# Patient Record
Sex: Female | Born: 1955 | Race: Black or African American | Hispanic: No | Marital: Single | Smoking: Current every day smoker
Health system: Southern US, Community
[De-identification: ages and names within clinical notes are randomized; demographics above are authoritative.]

## PROBLEM LIST (undated history)

## (undated) DIAGNOSIS — I10 Essential (primary) hypertension: Secondary | ICD-10-CM

## (undated) DIAGNOSIS — E785 Hyperlipidemia, unspecified: Secondary | ICD-10-CM

## (undated) DIAGNOSIS — R200 Anesthesia of skin: Secondary | ICD-10-CM

## (undated) DIAGNOSIS — K439 Ventral hernia without obstruction or gangrene: Secondary | ICD-10-CM

## (undated) DIAGNOSIS — M1990 Unspecified osteoarthritis, unspecified site: Secondary | ICD-10-CM

## (undated) DIAGNOSIS — H35039 Hypertensive retinopathy, unspecified eye: Secondary | ICD-10-CM

## (undated) DIAGNOSIS — R51 Headache: Secondary | ICD-10-CM

## (undated) DIAGNOSIS — J189 Pneumonia, unspecified organism: Secondary | ICD-10-CM

## (undated) DIAGNOSIS — R42 Dizziness and giddiness: Secondary | ICD-10-CM

## (undated) DIAGNOSIS — R609 Edema, unspecified: Secondary | ICD-10-CM

## (undated) DIAGNOSIS — H269 Unspecified cataract: Secondary | ICD-10-CM

## (undated) DIAGNOSIS — R519 Headache, unspecified: Secondary | ICD-10-CM

## (undated) DIAGNOSIS — R600 Localized edema: Secondary | ICD-10-CM

## (undated) HISTORY — PX: LUMBAR FUSION: SHX111

## (undated) HISTORY — PX: VENTRAL HERNIA REPAIR: SHX424

## (undated) HISTORY — PX: ABDOMINAL HYSTERECTOMY: SHX81

## (undated) HISTORY — PX: FEMUR IM NAIL: SHX1597

## (undated) HISTORY — DX: Unspecified cataract: H26.9

## (undated) HISTORY — PX: UTERINE FIBROID SURGERY: SHX826

## (undated) HISTORY — DX: Hypertensive retinopathy, unspecified eye: H35.039

## (undated) HISTORY — PX: FRACTURE SURGERY: SHX138

## (undated) HISTORY — PX: OTHER SURGICAL HISTORY: SHX169

---

## 2007-11-17 ENCOUNTER — Ambulatory Visit: Payer: Self-pay | Admitting: Internal Medicine

## 2007-11-17 DIAGNOSIS — D259 Leiomyoma of uterus, unspecified: Secondary | ICD-10-CM | POA: Insufficient documentation

## 2007-11-17 DIAGNOSIS — I10 Essential (primary) hypertension: Secondary | ICD-10-CM | POA: Insufficient documentation

## 2007-11-17 LAB — CONVERTED CEMR LAB
Bilirubin Urine: NEGATIVE
Calcium: 9.4 mg/dL (ref 8.4–10.5)
Glucose, Bld: 88 mg/dL (ref 70–99)
Glucose, Urine, Semiquant: NEGATIVE
Ketones, urine, test strip: NEGATIVE
Potassium: 5.2 meq/L (ref 3.5–5.3)
Sodium: 144 meq/L (ref 135–145)
Urobilinogen, UA: 0.2
pH: 5

## 2007-11-25 ENCOUNTER — Ambulatory Visit (HOSPITAL_COMMUNITY): Admission: RE | Admit: 2007-11-25 | Discharge: 2007-11-25 | Payer: Self-pay | Admitting: Internal Medicine

## 2007-12-01 ENCOUNTER — Ambulatory Visit: Payer: Self-pay | Admitting: Internal Medicine

## 2007-12-02 ENCOUNTER — Encounter (INDEPENDENT_AMBULATORY_CARE_PROVIDER_SITE_OTHER): Payer: Self-pay | Admitting: Internal Medicine

## 2007-12-06 ENCOUNTER — Encounter (INDEPENDENT_AMBULATORY_CARE_PROVIDER_SITE_OTHER): Payer: Self-pay | Admitting: Internal Medicine

## 2007-12-06 ENCOUNTER — Encounter: Admission: RE | Admit: 2007-12-06 | Discharge: 2007-12-06 | Payer: Self-pay | Admitting: Internal Medicine

## 2007-12-08 ENCOUNTER — Ambulatory Visit: Payer: Self-pay | Admitting: Internal Medicine

## 2007-12-23 ENCOUNTER — Encounter (INDEPENDENT_AMBULATORY_CARE_PROVIDER_SITE_OTHER): Payer: Self-pay | Admitting: *Deleted

## 2007-12-23 LAB — CONVERTED CEMR LAB
CO2: 22 meq/L (ref 19–32)
Chloride: 114 meq/L — ABNORMAL HIGH (ref 96–112)
Glucose, Bld: 77 mg/dL (ref 70–99)
Potassium: 4.9 meq/L (ref 3.5–5.3)
Sodium: 146 meq/L — ABNORMAL HIGH (ref 135–145)

## 2008-01-24 ENCOUNTER — Ambulatory Visit: Payer: Self-pay | Admitting: Internal Medicine

## 2008-01-29 ENCOUNTER — Encounter (INDEPENDENT_AMBULATORY_CARE_PROVIDER_SITE_OTHER): Payer: Self-pay | Admitting: Internal Medicine

## 2008-01-29 LAB — CONVERTED CEMR LAB
ALT: 21 units/L (ref 0–35)
Alkaline Phosphatase: 67 units/L (ref 39–117)
Basophils Absolute: 0 10*3/uL (ref 0.0–0.1)
CO2: 25 meq/L (ref 19–32)
Cholesterol: 175 mg/dL (ref 0–200)
Creatinine, Ser: 1.02 mg/dL (ref 0.40–1.20)
Eosinophils Absolute: 0.1 10*3/uL (ref 0.0–0.7)
Eosinophils Relative: 1 % (ref 0–5)
HCT: 43 % (ref 36.0–46.0)
Hemoglobin: 13.9 g/dL (ref 12.0–15.0)
LDL Cholesterol: 78 mg/dL (ref 0–99)
Lymphocytes Relative: 36 % (ref 12–46)
MCHC: 32.3 g/dL (ref 30.0–36.0)
MCV: 74.1 fL — ABNORMAL LOW (ref 78.0–100.0)
Monocytes Absolute: 0.4 10*3/uL (ref 0.1–1.0)
Platelets: 321 10*3/uL (ref 150–400)
RDW: 16.4 % — ABNORMAL HIGH (ref 11.5–15.5)
Sodium: 140 meq/L (ref 135–145)
Total Bilirubin: 0.5 mg/dL (ref 0.3–1.2)
Total CHOL/HDL Ratio: 2.5
Total Protein: 8.1 g/dL (ref 6.0–8.3)
Triglycerides: 139 mg/dL (ref <150)
VLDL: 28 mg/dL (ref 0–40)

## 2008-07-05 ENCOUNTER — Emergency Department (HOSPITAL_COMMUNITY): Admission: EM | Admit: 2008-07-05 | Discharge: 2008-07-05 | Payer: Self-pay | Admitting: Emergency Medicine

## 2008-08-02 ENCOUNTER — Encounter: Admission: RE | Admit: 2008-08-02 | Discharge: 2008-08-02 | Payer: Self-pay | Admitting: Internal Medicine

## 2008-10-12 ENCOUNTER — Encounter (INDEPENDENT_AMBULATORY_CARE_PROVIDER_SITE_OTHER): Payer: Self-pay | Admitting: Internal Medicine

## 2008-11-06 ENCOUNTER — Ambulatory Visit: Payer: Self-pay | Admitting: Internal Medicine

## 2008-11-06 DIAGNOSIS — L301 Dyshidrosis [pompholyx]: Secondary | ICD-10-CM | POA: Insufficient documentation

## 2008-11-06 DIAGNOSIS — M545 Low back pain: Secondary | ICD-10-CM

## 2008-11-06 DIAGNOSIS — G8929 Other chronic pain: Secondary | ICD-10-CM | POA: Insufficient documentation

## 2008-11-06 LAB — CONVERTED CEMR LAB
BUN: 11 mg/dL (ref 6–23)
Bilirubin Urine: NEGATIVE
CO2: 21 meq/L (ref 19–32)
Glucose, Bld: 88 mg/dL (ref 70–99)
Nitrite: NEGATIVE
Sodium: 144 meq/L (ref 135–145)
Specific Gravity, Urine: 1.015
Urobilinogen, UA: 0.2
pH: 5.5

## 2008-11-19 ENCOUNTER — Encounter: Admission: RE | Admit: 2008-11-19 | Discharge: 2008-12-26 | Payer: Self-pay | Admitting: Internal Medicine

## 2008-11-19 ENCOUNTER — Encounter (INDEPENDENT_AMBULATORY_CARE_PROVIDER_SITE_OTHER): Payer: Self-pay | Admitting: Internal Medicine

## 2008-11-23 ENCOUNTER — Telehealth (INDEPENDENT_AMBULATORY_CARE_PROVIDER_SITE_OTHER): Payer: Self-pay | Admitting: Internal Medicine

## 2008-11-26 ENCOUNTER — Encounter: Admission: RE | Admit: 2008-11-26 | Discharge: 2008-11-26 | Payer: Self-pay | Admitting: Internal Medicine

## 2008-11-29 ENCOUNTER — Ambulatory Visit: Payer: Self-pay | Admitting: Internal Medicine

## 2008-12-13 ENCOUNTER — Ambulatory Visit: Payer: Self-pay | Admitting: Internal Medicine

## 2008-12-27 ENCOUNTER — Ambulatory Visit: Payer: Self-pay | Admitting: Internal Medicine

## 2009-01-05 ENCOUNTER — Emergency Department (HOSPITAL_COMMUNITY): Admission: EM | Admit: 2009-01-05 | Discharge: 2009-01-05 | Payer: Self-pay | Admitting: Emergency Medicine

## 2009-01-18 ENCOUNTER — Encounter (INDEPENDENT_AMBULATORY_CARE_PROVIDER_SITE_OTHER): Payer: Self-pay | Admitting: Internal Medicine

## 2009-04-26 ENCOUNTER — Ambulatory Visit: Payer: Self-pay | Admitting: Internal Medicine

## 2009-04-26 LAB — CONVERTED CEMR LAB
ALT: 29 units/L (ref 0–35)
Basophils Relative: 0 % (ref 0–1)
CO2: 20 meq/L (ref 19–32)
Calcium: 8.9 mg/dL (ref 8.4–10.5)
Chloride: 108 meq/L (ref 96–112)
Creatinine, Ser: 1.02 mg/dL (ref 0.40–1.20)
Eosinophils Absolute: 0 10*3/uL (ref 0.0–0.7)
Glucose, Bld: 97 mg/dL (ref 70–99)
Glucose, Urine, Semiquant: NEGATIVE
HCT: 44.3 % (ref 36.0–46.0)
Hemoglobin: 14.1 g/dL (ref 12.0–15.0)
Lymphs Abs: 2.2 10*3/uL (ref 0.7–4.0)
MCHC: 31.8 g/dL (ref 30.0–36.0)
MCV: 74.2 fL — ABNORMAL LOW (ref 78.0–100.0)
Monocytes Absolute: 0.4 10*3/uL (ref 0.1–1.0)
Monocytes Relative: 6 % (ref 3–12)
Nitrite: NEGATIVE
Protein, U semiquant: 30
RBC: 5.97 M/uL — ABNORMAL HIGH (ref 3.87–5.11)
RPR Ser Ql: REACTIVE — AB
Sodium: 141 meq/L (ref 135–145)
Total Bilirubin: 0.8 mg/dL (ref 0.3–1.2)
Total Protein: 8 g/dL (ref 6.0–8.3)
Urobilinogen, UA: 0.2
WBC Urine, dipstick: NEGATIVE
WBC: 6 10*3/uL (ref 4.0–10.5)

## 2009-04-30 ENCOUNTER — Encounter (INDEPENDENT_AMBULATORY_CARE_PROVIDER_SITE_OTHER): Payer: Self-pay | Admitting: Internal Medicine

## 2009-05-01 ENCOUNTER — Ambulatory Visit (HOSPITAL_COMMUNITY): Admission: RE | Admit: 2009-05-01 | Discharge: 2009-05-01 | Payer: Self-pay | Admitting: Internal Medicine

## 2009-05-03 ENCOUNTER — Encounter (INDEPENDENT_AMBULATORY_CARE_PROVIDER_SITE_OTHER): Payer: Self-pay | Admitting: *Deleted

## 2009-05-14 ENCOUNTER — Ambulatory Visit: Payer: Self-pay | Admitting: Internal Medicine

## 2009-05-14 DIAGNOSIS — A539 Syphilis, unspecified: Secondary | ICD-10-CM | POA: Insufficient documentation

## 2009-05-16 ENCOUNTER — Encounter (INDEPENDENT_AMBULATORY_CARE_PROVIDER_SITE_OTHER): Payer: Self-pay | Admitting: *Deleted

## 2009-05-23 ENCOUNTER — Encounter (INDEPENDENT_AMBULATORY_CARE_PROVIDER_SITE_OTHER): Payer: Self-pay | Admitting: *Deleted

## 2009-05-28 ENCOUNTER — Telehealth (INDEPENDENT_AMBULATORY_CARE_PROVIDER_SITE_OTHER): Payer: Self-pay | Admitting: Internal Medicine

## 2009-06-07 ENCOUNTER — Encounter (INDEPENDENT_AMBULATORY_CARE_PROVIDER_SITE_OTHER): Payer: Self-pay | Admitting: *Deleted

## 2009-06-07 ENCOUNTER — Ambulatory Visit: Payer: Self-pay | Admitting: Gastroenterology

## 2009-06-17 ENCOUNTER — Telehealth (INDEPENDENT_AMBULATORY_CARE_PROVIDER_SITE_OTHER): Payer: Self-pay | Admitting: Internal Medicine

## 2009-06-21 ENCOUNTER — Telehealth (INDEPENDENT_AMBULATORY_CARE_PROVIDER_SITE_OTHER): Payer: Self-pay | Admitting: Internal Medicine

## 2009-07-02 ENCOUNTER — Encounter (INDEPENDENT_AMBULATORY_CARE_PROVIDER_SITE_OTHER): Payer: Self-pay | Admitting: Internal Medicine

## 2009-07-04 ENCOUNTER — Ambulatory Visit: Payer: Self-pay | Admitting: Gastroenterology

## 2009-07-07 ENCOUNTER — Telehealth (INDEPENDENT_AMBULATORY_CARE_PROVIDER_SITE_OTHER): Payer: Self-pay | Admitting: Internal Medicine

## 2009-07-17 ENCOUNTER — Ambulatory Visit: Payer: Self-pay | Admitting: Internal Medicine

## 2009-07-17 ENCOUNTER — Telehealth (INDEPENDENT_AMBULATORY_CARE_PROVIDER_SITE_OTHER): Payer: Self-pay | Admitting: Internal Medicine

## 2009-07-17 LAB — CONVERTED CEMR LAB
Hep B C IgM: NEGATIVE
Hep B S Ab: NEGATIVE
Rapid HIV Screen: NEGATIVE

## 2009-07-21 ENCOUNTER — Encounter (INDEPENDENT_AMBULATORY_CARE_PROVIDER_SITE_OTHER): Payer: Self-pay | Admitting: Internal Medicine

## 2009-07-21 DIAGNOSIS — K5730 Diverticulosis of large intestine without perforation or abscess without bleeding: Secondary | ICD-10-CM | POA: Insufficient documentation

## 2009-07-30 ENCOUNTER — Encounter (INDEPENDENT_AMBULATORY_CARE_PROVIDER_SITE_OTHER): Payer: Self-pay | Admitting: *Deleted

## 2009-08-06 ENCOUNTER — Encounter (INDEPENDENT_AMBULATORY_CARE_PROVIDER_SITE_OTHER): Payer: Self-pay | Admitting: Internal Medicine

## 2009-08-12 ENCOUNTER — Encounter (INDEPENDENT_AMBULATORY_CARE_PROVIDER_SITE_OTHER): Payer: Self-pay | Admitting: *Deleted

## 2009-08-14 ENCOUNTER — Encounter: Admission: RE | Admit: 2009-08-14 | Discharge: 2009-08-14 | Payer: Self-pay | Admitting: Neurosurgery

## 2009-08-28 ENCOUNTER — Encounter: Admission: RE | Admit: 2009-08-28 | Discharge: 2009-08-28 | Payer: Self-pay | Admitting: Neurosurgery

## 2009-09-20 ENCOUNTER — Ambulatory Visit: Payer: Self-pay | Admitting: Internal Medicine

## 2009-09-20 DIAGNOSIS — B351 Tinea unguium: Secondary | ICD-10-CM | POA: Insufficient documentation

## 2009-09-20 DIAGNOSIS — B359 Dermatophytosis, unspecified: Secondary | ICD-10-CM | POA: Insufficient documentation

## 2009-09-20 LAB — CONVERTED CEMR LAB
RPR Ser Ql: REACTIVE — AB
RPR Titer: 1:16 {titer} — AB
T pallidum Antibodies (TP-PA): >8 — ABNORMAL HIGH (ref <0.90)

## 2009-10-01 ENCOUNTER — Encounter (INDEPENDENT_AMBULATORY_CARE_PROVIDER_SITE_OTHER): Payer: Self-pay | Admitting: Internal Medicine

## 2009-10-22 ENCOUNTER — Encounter (INDEPENDENT_AMBULATORY_CARE_PROVIDER_SITE_OTHER): Payer: Self-pay | Admitting: Internal Medicine

## 2009-11-01 ENCOUNTER — Ambulatory Visit: Payer: Self-pay | Admitting: Internal Medicine

## 2009-11-09 LAB — CONVERTED CEMR LAB
ALT: 26 units/L (ref 0–35)
Albumin: 4.2 g/dL (ref 3.5–5.2)
Indirect Bilirubin: 0.4 mg/dL (ref 0.0–0.9)
Total Protein: 7.8 g/dL (ref 6.0–8.3)

## 2009-11-12 ENCOUNTER — Telehealth (INDEPENDENT_AMBULATORY_CARE_PROVIDER_SITE_OTHER): Payer: Self-pay | Admitting: Internal Medicine

## 2009-11-15 ENCOUNTER — Encounter (INDEPENDENT_AMBULATORY_CARE_PROVIDER_SITE_OTHER): Payer: Self-pay | Admitting: *Deleted

## 2009-11-28 ENCOUNTER — Encounter: Admission: RE | Admit: 2009-11-28 | Discharge: 2009-11-28 | Payer: Self-pay | Admitting: Internal Medicine

## 2009-12-13 ENCOUNTER — Ambulatory Visit: Payer: Self-pay | Admitting: Internal Medicine

## 2009-12-13 LAB — CONVERTED CEMR LAB
Bilirubin, Direct: 0.1 mg/dL (ref 0.0–0.3)
Indirect Bilirubin: 0.2 mg/dL (ref 0.0–0.9)
Total Bilirubin: 0.3 mg/dL (ref 0.3–1.2)

## 2009-12-17 ENCOUNTER — Telehealth (INDEPENDENT_AMBULATORY_CARE_PROVIDER_SITE_OTHER): Payer: Self-pay | Admitting: Internal Medicine

## 2009-12-17 ENCOUNTER — Ambulatory Visit: Payer: Self-pay | Admitting: Internal Medicine

## 2009-12-17 DIAGNOSIS — F17200 Nicotine dependence, unspecified, uncomplicated: Secondary | ICD-10-CM | POA: Insufficient documentation

## 2009-12-19 ENCOUNTER — Encounter (INDEPENDENT_AMBULATORY_CARE_PROVIDER_SITE_OTHER): Payer: Self-pay | Admitting: *Deleted

## 2010-01-07 ENCOUNTER — Encounter (INDEPENDENT_AMBULATORY_CARE_PROVIDER_SITE_OTHER): Payer: Self-pay | Admitting: Internal Medicine

## 2010-01-07 ENCOUNTER — Ambulatory Visit (HOSPITAL_COMMUNITY): Admission: RE | Admit: 2010-01-07 | Discharge: 2010-01-07 | Payer: Self-pay | Admitting: Neurosurgery

## 2010-01-13 ENCOUNTER — Emergency Department (HOSPITAL_COMMUNITY): Admission: EM | Admit: 2010-01-13 | Discharge: 2010-01-14 | Payer: Self-pay | Admitting: Emergency Medicine

## 2010-01-21 ENCOUNTER — Ambulatory Visit: Payer: Self-pay | Admitting: Internal Medicine

## 2010-01-21 DIAGNOSIS — S0100XA Unspecified open wound of scalp, initial encounter: Secondary | ICD-10-CM | POA: Insufficient documentation

## 2010-01-21 LAB — CONVERTED CEMR LAB
Albumin: 4.1 g/dL (ref 3.5–5.2)
Alkaline Phosphatase: 66 units/L (ref 39–117)
BUN: 15 mg/dL (ref 6–23)
CO2: 25 meq/L (ref 19–32)
Calcium: 9.6 mg/dL (ref 8.4–10.5)
Chloride: 111 meq/L (ref 96–112)
Glucose, Bld: 95 mg/dL (ref 70–99)
Potassium: 4.7 meq/L (ref 3.5–5.3)
Sodium: 147 meq/L — ABNORMAL HIGH (ref 135–145)
Total Protein: 7.4 g/dL (ref 6.0–8.3)

## 2010-02-10 ENCOUNTER — Telehealth (INDEPENDENT_AMBULATORY_CARE_PROVIDER_SITE_OTHER): Payer: Self-pay | Admitting: Internal Medicine

## 2010-02-14 ENCOUNTER — Telehealth (INDEPENDENT_AMBULATORY_CARE_PROVIDER_SITE_OTHER): Payer: Self-pay | Admitting: *Deleted

## 2010-03-05 ENCOUNTER — Encounter (INDEPENDENT_AMBULATORY_CARE_PROVIDER_SITE_OTHER): Payer: Self-pay | Admitting: Internal Medicine

## 2010-03-11 ENCOUNTER — Encounter (INDEPENDENT_AMBULATORY_CARE_PROVIDER_SITE_OTHER): Payer: Self-pay | Admitting: Internal Medicine

## 2010-03-11 ENCOUNTER — Telehealth (INDEPENDENT_AMBULATORY_CARE_PROVIDER_SITE_OTHER): Payer: Self-pay | Admitting: Internal Medicine

## 2010-03-17 ENCOUNTER — Ambulatory Visit: Payer: Self-pay | Admitting: Internal Medicine

## 2010-03-21 ENCOUNTER — Encounter (INDEPENDENT_AMBULATORY_CARE_PROVIDER_SITE_OTHER): Payer: Self-pay | Admitting: Internal Medicine

## 2010-03-23 ENCOUNTER — Emergency Department (HOSPITAL_COMMUNITY)
Admission: EM | Admit: 2010-03-23 | Discharge: 2010-03-23 | Payer: Self-pay | Source: Home / Self Care | Admitting: Emergency Medicine

## 2010-04-01 ENCOUNTER — Ambulatory Visit: Payer: Self-pay | Admitting: Internal Medicine

## 2010-04-03 ENCOUNTER — Encounter (INDEPENDENT_AMBULATORY_CARE_PROVIDER_SITE_OTHER): Payer: Self-pay | Admitting: *Deleted

## 2010-05-04 ENCOUNTER — Encounter: Payer: Self-pay | Admitting: Internal Medicine

## 2010-05-13 ENCOUNTER — Encounter (INDEPENDENT_AMBULATORY_CARE_PROVIDER_SITE_OTHER): Payer: Self-pay | Admitting: Internal Medicine

## 2010-05-13 NOTE — Progress Notes (Signed)
Summary: Neurosurgery update  Phone Note From Other Clinic   Caller: Referral Coordinator Summary of Call: Pt went to appt @ [facility] this morning & did not take her copay of 3.00.Pt seemed uninterested in providing copay.Pt's appt was cancelled and would need to call to get her appt R/S when she gets her copay.Per Dr.Jenkins Initial call taken by: Candi Leash,  June 21, 2009 10:47 AM  Follow-up for Phone Call        Please call pt. and see what's going on. Follow-up by: Julieanne Manson MD,  June 22, 2009 2:53 PM  Additional Follow-up for Phone Call Additional follow up Details #1::        No answer at [phone]. Additional Follow-up by: Vesta Mixer CMA,  June 26, 2009 10:38 AM    Additional Follow-up for Phone Call Additional follow up Details #2::    no answer still at same number. Follow-up by: Vesta Mixer CMA,  July 08, 2009 2:15 PM  Additional Follow-up for Phone Call Additional follow up Details #3:: Details for Additional Follow-up Action Taken: Spoke w/pt. Pt. notified me that she did r/s her neuro appt. for 08/06/09.  Additional Follow-up by: Chauncy Passy, SMA

## 2010-05-13 NOTE — Letter (Signed)
Summary: Candi Leash and No Reschedule  [facility]  [address] [place], [place] [address]   Phone: [phone]  Fax: [phone]      May 16, 2009 MRN: [id number]   Shannon [name] [address] [place], [place]  [address]     You recently cancelled your endoscopic procedure at the [facility] and did not reschedule for another date.    Your provider recommended this procedure for the benefit of your health.  It is very important that you reschedule it.  Failure to do so may be to the detriment of your health.  Please call us at [phone] and we will be happy to assist you with rescheduling.    If you were referred for this procedure by another physician/provider, we will notify him/her that you did not keep your appointment.   Sincerely,   [facility]

## 2010-05-13 NOTE — Letter (Signed)
Summary: *HSN Results Follow up  [facility]  [address] [place], [place] [address]   Phone: [phone]  Fax: [phone]      10/01/2009   Shannon [name] [address] [name], [place]  [address]   Dear  [name],                            ____S.Drinkard,FNP   ____D. Gore,FNP       ____B. McPherson,MD   ____V. Rankins,MD    __X__E. Alann Avey,MD    ____N. Daphine Deutscher, FNP  ____D. Reche Dixon, MD    ____K. Philipp Deputy, MD    ____Other     This letter is to inform you that your recent test(s):  _______Pap Smear    ___X____Lab Test     _______X-ray    _______ is within acceptable limits  _______ requires a medication change  _______ requires a follow-up lab visit  _______ requires a follow-up visit with your provider   Comments:  Your blood levels for the syphilis testing are the same.  Will continue to follow.       _________________________________________________________ If you have any questions, please contact our office                     Sincerely,  Julieanne Manson MD [facility]

## 2010-05-13 NOTE — Letter (Signed)
Summary: Previsit letter  [facility]  [address] [place], [place] [address]   Phone: [phone]  Fax: [phone]       05/03/2009 MRN: [id number]  [facility] [address] [place], [place]  [address]  Dear Shannon [name],  Welcome to the Gastroenterology Division at [facility].    You are scheduled to see a nurse for your pre-procedure visit on 05-16-09 at [facility] on the 3rd floor at [facility], [address]  We ask that you try to arrive at our office 15 minutes prior to your appointment time to allow for check-in.  Your nurse visit will consist of discussing your medical and surgical history, your immediate family medical history, and your medications.    Please bring a complete list of all your medications or, if you prefer, bring the medication bottles and we will list them.  We will need to be aware of both prescribed and over the counter drugs.  We will need to know exact dosage information as well.  If you are on blood thinners (Coumadin, Plavix, Aggrenox, Ticlid, etc.) please call our office today/prior to your appointment, as we need to consult with your physician about holding your medication.   Please be prepared to read and sign documents such as consent forms, a financial agreement, and acknowledgement forms.  If necessary, and with your consent, a friend or relative is welcome to sit-in on the nurse visit with you.  Please bring your insurance card so that we may make a copy of it.  If your insurance requires a referral to see a specialist, please bring your referral form from your primary care physician.  No co-pay is required for this nurse visit.     If you cannot keep your appointment, please call [phone] to cancel or reschedule prior to your appointment date.  This allows [place] the opportunity to schedule an appointment for another patient in need of care.    Thank you for choosing [facility] for your medical  needs.  We appreciate the opportunity to care for you.  Please visit [place] at our website  to learn more about our practice.                     Sincerely.                                                                                                                   The Gastroenterology Division

## 2010-05-13 NOTE — Letter (Signed)
Summary: Generic Letter  [facility]  [address] [place], [place] [address]   Phone: [phone]  Fax: [phone]    12/19/2009  SHEMEKIA PATANE [address] [name], [place]  [address]  Dear Ms. 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 YOU SHOULD HAVE ONE MONTH OF TREATMENT LEFT. YOU MAY NEED ANOTHER REFILL, PLEASE CONTACT YOUR PHARMACY FOR ADDITIONAL REFILLS. YOU NEED TO MAKE SURE TO TAKE THE MEDICATION FOR A COMPLETE 12 WEEKS.  ALSO MAKE SURE TO SPRAY YOUR SHOES DAILY AND LET THEM DRY.            Sincerely,   Chantel Hyacinth Meeker

## 2010-05-13 NOTE — Procedures (Signed)
Summary: Colonoscopy  Patient: [name] Note: All result statuses are Final unless otherwise noted.  Tests: (1) Colonoscopy (COL)   COL Colonoscopy           DONE     [facility]     [address]     [place], [place]  [address]           COLONOSCOPY PROCEDURE REPORT           PATIENT:  Shannon [name], Shannon [name]  MR#:  [id number]     BIRTHDATE:  16-Aug-1955, 53 yrs. old  GENDER:  female     ENDOSCOPIST:  Barbette Hair. Arlyce Dice, MD     REF. BY:     PROCEDURE DATE:  07/04/2009     PROCEDURE:  Colonoscopy, Diagnostic     ASA CLASS:  Class II     INDICATIONS:  Routine Risk Screening     MEDICATIONS:   Fentanyl 75 mcg IV, Versed 6 mg IV           DESCRIPTION OF PROCEDURE:   After the risks benefits and     alternatives of the procedure were thoroughly explained, informed     consent was obtained.  Digital rectal exam was performed and     revealed no abnormalities.   The LB [id number] endoscope     was introduced through the anus and advanced to the cecum, which     was identified by both the appendix and ileocecal valve, without     limitations.  The quality of the prep was excellent, using     MoviPrep.  The instrument was then slowly withdrawn as the colon     was fully examined.     <<PROCEDUREIMAGES>>           FINDINGS:  Scattered diverticula were found ascending colon to     sigmoid colon (see image9, image11, image12, and image15).  This     was otherwise a normal examination of the colon (see image1,     image3, image5, image13, image17, image18, and image21).     Retroflexed views in the rectum revealed no abnormalities.    The     scope was then withdrawn from the patient and the procedure     completed.           COMPLICATIONS:  None     ENDOSCOPIC IMPRESSION:     1) Diverticula, scattered ascending colon to sigmoid colon     2) Otherwise normal examination     RECOMMENDATIONS:     1) Continue current colorectal screening recommendations for  "routine risk" patients with a repeat colonoscopy in 10 years.     REPEAT EXAM:  In 10 year(s) for Colonoscopy.           ______________________________     Barbette Hair. Arlyce Dice, MD           CC:  Julieanne Manson, MD           n.     Rosalie DoctorBarbette Hair. Kaplan at 07/04/2009 12:19 PM           Lupinacci, Cathlean Cower, [id number]  Note: An exclamation mark (!) indicates a result that was not dispersed into the flowsheet. Document Creation Date: 07/04/2009 12:19 PM _______________________________________________________________________  (1) Order result status: Final Collection or observation date-time: 07/04/2009 12:13 Requested date-time:  Receipt date-time:  Reported date-time:  Referring Physician:   Ordering Physician: Melvia Heaps [id number]) Specimen Source:  Source: Launa Grill  Order Number: [id number] Lab site:   Appended Document: Colonoscopy    Clinical Lists Changes  Observations: Added new observation of COLONNXTDUE: 06/2019 (07/04/2009 13:05)      Appended Document: Colonoscopy    Clinical Lists Changes  Problems: Added new problem of DIVERTICULOSIS, COLON (ICD-562.10) Changed problem from SYPHILIS (ICD-097.9) - Treated in late 1990s per pt, as well as partner.  One shot of penicillin.  Treated at [facility] to SYPHILIS (ICD-097.9) - Treated in late 1990s per pt, as well as partner.  One shot of penicillin.  Treated at [facility]

## 2010-05-13 NOTE — Letter (Signed)
Summary: Generic Letter  [facility]  [address] [place], [place] [address]   Phone: [phone]  Fax: [phone]    07/30/2009  Shannon [name] [address] [name], [place]  [address]  Dear [name],  We have been unable to reach you by phone. If you have not seen the neurosurgeon yet for your back pain and need pain medication; please call our office to schedule an appointment.         Sincerely,   Gaylyn Cheers RN  Appended Document: disregard letter Noted after writing letter that pain medication has already been addressed. Letter not mailed [facility] RN  July 30, 2009 11:52 AM    Clinical Lists Changes

## 2010-05-13 NOTE — Letter (Signed)
Summary: [facility] Instructions  [facility]  [address] [place], [place] [address]   Phone: [phone]  Fax: [phone]       Shannon [name]    [birth year]    MRN: [id number]        Procedure Day /Date:THURSDAY  07/04/09     Arrival Time:  10:30AM     Procedure Time:  11:30AM     Location of Procedure:                    _ X_  [facility] (4th Floor)                         PREPARATION FOR COLONOSCOPY WITH MOVIPREP   Starting 5 days prior to your procedure 3/19 do not eat nuts, seeds, popcorn, corn, beans, peas,  salads, or any raw vegetables.  Do not take any fiber supplements (e.g. Metamucil, Citrucel, and Benefiber).  THE DAY BEFORE YOUR PROCEDURE         DATE: 07/03/09  DAY: WEDNESDAY  1.  Drink clear liquids the entire day-NO SOLID FOOD  2.  Do not drink anything colored red or purple.  Avoid juices with pulp.  No orange juice.  3.  Drink at least 64 oz. (8 glasses) of fluid/clear liquids during the day to prevent dehydration and help the prep work efficiently.  CLEAR LIQUIDS INCLUDE: Water Jello Ice Popsicles Tea (sugar ok, no milk/cream) Powdered fruit flavored drinks Coffee (sugar ok, no milk/cream) Gatorade Juice: apple, white grape, white cranberry  Lemonade Clear bullion, consomm, broth Carbonated beverages (any kind) Strained chicken noodle soup Hard Candy                             4.  In the morning, mix first dose of MoviPrep solution:    Empty 1 Pouch A and 1 Pouch B into the disposable container    Add lukewarm drinking water to the top line of the container. Mix to dissolve    Refrigerate (mixed solution should be used within 24 hrs)  5.  Begin drinking the prep at 5:00 p.m. The MoviPrep container is divided by 4 marks.   Every 15 minutes drink the solution down to the next mark (approximately 8 oz) until the full liter is complete.   6.  Follow completed prep with 16 oz of clear liquid of your choice  (Nothing red or purple).  Continue to drink clear liquids until bedtime.  7.  Before going to bed, mix second dose of MoviPrep solution:    Empty 1 Pouch A and 1 Pouch B into the disposable container    Add lukewarm drinking water to the top line of the container. Mix to dissolve    Refrigerate  THE DAY OF YOUR PROCEDURE      DATE: 07/04/09  DAY: THURSDAY  Beginning at 6:30AM (5 hours before procedure):         1. Every 15 minutes, drink the solution down to the next mark (approx 8 oz) until the full liter is complete.  2. Follow completed prep with 16 oz. of clear liquid of your choice.    3. You may drink clear liquids until 9:30AM (2 HOURS BEFORE PROCEDURE).   MEDICATION INSTRUCTIONS  Unless otherwise instructed, you should take regular prescription medications with a small sip of water   as early as possible the morning of  your procedure.   Additional medication instructions:  Hold HCTZ the morning of procedure.         OTHER INSTRUCTIONS  You will need a responsible adult at least 53 years of age to accompany you and drive you home.   This person must remain in the waiting room during your procedure.  Wear loose fitting clothing that is easily removed.  Leave jewelry and other valuables at home.  However, you may wish to bring a book to read or  an iPod/MP3 player to listen to music as you wait for your procedure to start.  Remove all body piercing jewelry and leave at home.  Total time from sign-in until discharge is approximately 2-3 hours.  You should go home directly after your procedure and rest.  You can resume normal activities the  day after your procedure.  The day of your procedure you should not:   Drive   Make legal decisions   Operate machinery   Drink alcohol   Return to work  You will receive specific instructions about eating, activities and medications before you leave.    The above instructions have been reviewed and explained to  me by   Wyona Almas RN  June 07, 2009 10:05 AM _    I fully understand and can verbalize these instructions _____________________________ Date _________
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
## 2010-05-13 NOTE — Progress Notes (Signed)
  Phone Note Outgoing Call   Summary of Call: Tiffany--I see the release of info for Shannon [name]. PHD--appears to have actually been signed beginning of February.  I do not see that pt. came in for labs as requested beginning of March--please see that phone note that is now signed off--please get pt. in for labs requested in that phone note, including HIV.  Julieanne Manson MD  July 07, 2009 6:09 PM    Follow-up for Phone Call        Pt was a no show for her lab appt.  No answer at pt's number. Follow-up by: Vesta Mixer CMA,  July 08, 2009 2:14 PM  Additional Follow-up for Phone Call Additional follow up Details #1::        Pt rescheduled for 07/17/09 Additional Follow-up by: Vesta Mixer CMA,  July 09, 2009 2:42 PM

## 2010-05-13 NOTE — Letter (Signed)
Summary: MAILED REQUESTED RECORDS TO [facility]  MAILED REQUESTED RECORDS TO [facility]   Imported By: Arta Bruce 03/21/2010 16:32:42  _____________________________________________________________________  External Attachment:    Type:   Image     Comment:   External Document

## 2010-05-13 NOTE — Progress Notes (Signed)
Summary: neurosurgery referral  Phone Note Outgoing Call   Summary of Call: NOra--needs to go back to Dr. Nigel Berthold seen him previously for the lumbar radiculopathy, but did not know if she needed another formal referral.  She wants to proceed with the surgery. Initial call taken by: Julieanne Manson MD,  December 17, 2009 10:56 AM  Follow-up for Phone Call        I FAXED THE REFERRAL TO [facility]  Follow-up by: Cheryll Dessert,  December 20, 2009 5:42 PM

## 2010-05-13 NOTE — Assessment & Plan Note (Signed)
Summary: RINGWORM IN HER NECK & BIG TOE ITCHY  / NS   Vital Signs:  Patient profile:   55 year old female Menstrual status:  hysterectomy Weight:      180.1 pounds BSA:     1.87 Temp:     97.9 degrees F oral Pulse rate:   52 / minute Pulse rhythm:   regular Resp:     20 per minute BP sitting:   134 / 90  (left arm) Cuff size:   regular  Vitals Entered By: Gaylyn Cheers RN (September 20, 2009 9:46 AM) CC: No BP med  today, 2 mos rt neck circular area, used Calamine lotion which dried it out, itches, now using lotrimon with some relief of itching has gotten progressively bigger; rt great toenail discolored and itches Is Patient Diabetic? No Pain Assessment Patient in pain? no       Does patient need assistance? Functional Status Self care Ambulation Normal Comments did not bring meds   CC:  No BP med  today, 2 mos rt neck circular area, used Calamine lotion which dried it out, itches, and now using lotrimon with some relief of itching has gotten progressively bigger; rt great toenail discolored and itches.  History of Present Illness: 1.  Hx of positive RPR.  Sounds like pt. treated in [place] [facility]--PHD, but they do not have records.  Both pt. and partner feel she was treated.  Long term partner tested negative and pt. has not had a rash or other symptom to suggest continued harboring of syphilis.  RPR titer back in January was 1:16.  FTA-ABS as expected positive as well.   Pt's partner also with Hep C, but pt. negative on check.  2.  New  Rash on side of right face.  Noted end of May and very pruritic--circular.  Has enlarged over time.  Applying calamine lotion, which helped to dry out.  Has been applying Lotrimin for 1-2 weeks.  Has appeared to be getting better and pruritis much decreased.    3.  Hypertension:  out of bp med --last dose yesterday morning.  Has not gone to fill on time.  Allergies (verified): No Known Drug Allergies  Physical Exam  Head:  5 cm circular rash  with raised, flaky edge and central clearing at right lower preauricular/mandibular area.  No cervical adenopathy.  No involvement of scalp. Extremities:  Right great toenail and 3rd and 5th toenails with yellow brown discoloration, thickening and brittleness.   Impression & Recommendations:  Problem # 1:  RINGWORM (ICD-110.9) May continue with Lotrimin three times a day --but will need to treat until resolution of rash and for 2 weeks after resolution of rash. May switch to Lamisil topical two times a day and treat until resolution of rash. Treatment of toenail onychomycosis will help as well.  Problem # 2:  ONYCHOMYCOSIS, TOENAILS (ICD-110.1)  Her updated medication list for this problem includes:    Terbinafine Hcl 250 Mg Tabs (Terbinafine hcl) .Marland Kitchen... 1 tab by mouth daily for 12 weeks.  Problem # 3:  SYPHILIS (ICD-097.9)  Check titers again to be sure not increasing. At this point, feel she has been adequately treated.  Orders: T-Syphilis Test (RPR) [phone])  Problem # 4:  HYPERTENSION, ESSENTIAL (ICD-401.9) Discussed not running out of meds ever Her updated medication list for this problem includes:    Hydrochlorothiazide 25 Mg Tabs (Hydrochlorothiazide) .Marland Kitchen... 1 tab by mouth daily in morning    Toprol Xl 25 Mg Xr24h-tab (Metoprolol  succinate) .Marland Kitchen... 2 tabs by mouth daily  Complete Medication List: 1)  Hydrochlorothiazide 25 Mg Tabs (Hydrochlorothiazide) .Marland Kitchen.. 1 tab by mouth daily in morning 2)  Toprol Xl 25 Mg Xr24h-tab (Metoprolol succinate) .... 2 tabs by mouth daily 3)  Clobetasol Propionate 0.05 % Crea (Clobetasol propionate) .... Apply two times a day to patches 4)  Tramadol Hcl 50 Mg Tabs (Tramadol hcl) .Marland Kitchen.. 1 tab by mouth two times a day prn--save the second dose for bedtime-may only refill every 30 days 5)  Terbinafine Hcl 250 Mg Tabs (Terbinafine hcl) .Marland Kitchen.. 1 tab by mouth daily for 12 weeks.  Patient Instructions: 1)  Clean shower stall weekly with bleach  containing cleaner 2)  Spray all shoes with Lysol or Lotrimin antifungal spray and  allow to dry.  Then spray inside of each pair of shoes after each use and allow to dry before wearing again--do this forever. 3)  Lab appt. for liver enzymes (v58.69) in 6 weeks and 12 weeks 4)  For face:  May continue to use Lotrimin, but need to treat three times a day until rash is gone and for 14 days after rash disappears. 5)  If switch to Lamisil, can treat two times a day and can stop after rash gone.   6)  If you get started on the Lamisil pills, you can stop the cream (Lotrimin and Lamisil both) 7)  Follow up with Dr. Delrae Alfred in 4 months  Prescriptions: TERBINAFINE HCL 250 MG TABS (TERBINAFINE HCL) 1 tab by mouth daily for 12 weeks.  #84 x 0   Entered and Authorized by:   Julieanne Manson MD   Signed by:   Julieanne Manson MD on 09/20/2009   Method used:   Electronically to        [facility] [id number] (retail)       [address] [place], [place]  [address]       Ph: [phone]       Fax: [phone]   RxID:   [id number]

## 2010-05-13 NOTE — Letter (Signed)
Summary: [facility]   Imported By: Arta Bruce 01/24/2010 12:53:56  _____________________________________________________________________  External Attachment:    Type:   Image     Comment:   External Document

## 2010-05-13 NOTE — Letter (Signed)
Summary: *HSN Results Follow up  [facility]  [address] [place], [place] [address]   Phone: [phone]  Fax: [phone]      08/12/2009   Shannon [name] [address] [name], [place]  [address]   Dear  [name],                            ____S.Drinkard,FNP   ____D. Gore,FNP       ____B. McPherson,MD   ____V. Rankins,MD    __x__E. Mulberry,MD    ____N. Daphine Deutscher, FNP  ____D. Reche Dixon, MD    ____K. Philipp Deputy, MD    ____Other     This letter is to inform you that your recent test(s):  _______Pap Smear    _______Lab Test     _______X-ray    _______ is within acceptable limits  _______ requires a medication change  _______ requires a follow-up lab visit  _______ requires a follow-up visit with your provider   Comments:  Please call to schedule an appointment if you are still having pain issues.  Thank  you.       _________________________________________________________ If you have any questions, please contact our office                     Sincerely,  Vesta Mixer CMA [facility]

## 2010-05-13 NOTE — Letter (Signed)
Summary: TEST ORDER FORM//X RAY//APPT DATE & TIME  TEST ORDER FORM//X RAY//APPT DATE & TIME   Imported By: Arta Bruce 06/13/2009 13:01:28  _____________________________________________________________________  External Attachment:    Type:   Image     Comment:   External Document

## 2010-05-13 NOTE — Progress Notes (Signed)
Summary: TRAMADOL DOESNT HELP MUCH  Phone Note Call from Patient Call back at [facility] Phone [phone]   Summary of Call: [facility] PT. MS Bonetta SAYS THAT HER TRAMADOL IS NOT WORKING AT ALL FOR HER BACK, AND SHE HUTRS BAD. SHE SAYS THAT SHE EVERY NOW AND THEN WILL TAKE HER BOYFRIENDS VICODEN AND IT HELPS HER BETTER AND SHE WANTS TO KNWO IF HER TRAMADOL CAN BE CHANGED TO VICODEN.  SHE USES [facility] ON [address] Initial call taken by: Leodis Rains,  November 12, 2009 2:46 PM  Follow-up for Phone Call        She can try Ibuprofen 800 mg three times a day with food as needed for pain. She can also use Tylenol 1000 mg every 6 hours as needed for pain (no more than 4 doses in one day). Will defer changing medicines to her PCP (Dr. Delrae Alfred) when she returns next week. Tereso Newcomer PA-C  November 13, 2009 1:19 PM   Additional Follow-up for Phone Call Additional follow up Details #1::        Called [phone] and told I had the wrong #.  Will mail letter to pt. Additional Follow-up by: Vesta Mixer CMA,  November 15, 2009 4:52 PM     Appended Document: TRAMADOL DOESNT HELP MUCH pt called back and given info.  Phone number also corrected.

## 2010-05-13 NOTE — Progress Notes (Signed)
Summary: Endoscopy appointment already re-scheduled  Phone Note Call from Patient Call back at [facility] Phone [phone]   Summary of Call: The pt re-scheduled her endoscopy appointment and she needs to go on Feb 25 at 11 am and on March 11 at 10 am.  If you have any further questions, you can reach her back at the phone above. Juanitta Earnhardt MD Initial call taken by: Manon Hilding,  May 28, 2009 8:09 AM  Follow-up for Phone Call        FYI for [facility]..............................Marland KitchenMikey College CMA  May 28, 2009 10:39 AM   Additional Follow-up for Phone Call Additional follow up Details #1::        Please send a copy of her 1 in 3 stool cards positive to GI Did her husband's RPR results return?   Additional Follow-up by: Julieanne Manson MD,  June 04, 2009 5:49 PM    Additional Follow-up for Phone Call Additional follow up Details #2::    Noted. Follow-up by: Vesta Mixer CMA,  June 10, 2009 12:21 PM

## 2010-05-13 NOTE — Letter (Signed)
Summary: REQUESTING RECORDS FROM [facility] HEALTH DEPT  REQUESTING RECORDS FROM [facility] HEALTH DEPT   Imported By: Arta Bruce 07/02/2009 14:39:24  _____________________________________________________________________  External Attachment:    Type:   Image     Comment:   External Document

## 2010-05-13 NOTE — Miscellaneous (Signed)
Summary: [facility] Previsit/prep  Clinical Lists Changes  Medications: Added new medication of MOVIPREP 100 GM  SOLR (PEG-KCL-NACL-NASULF-NA ASC-C) As per prep instructions. - Signed Rx of MOVIPREP 100 GM  SOLR (PEG-KCL-NACL-NASULF-NA ASC-C) As per prep instructions.;  #1 x 0;  Signed;  Entered by: Wyona Almas RN;  Authorized by: Louis Meckel MD;  Method used: Electronically to [facility] [id number] [place], [place], [place]  [address], Ph: [phone], Fax: [phone] Observations: Added new observation of NKA: T (06/07/2009 9:27)    Prescriptions: MOVIPREP 100 GM  SOLR (PEG-KCL-NACL-NASULF-NA ASC-C) As per prep instructions.  #1 x 0   Entered by:   Wyona Almas RN   Authorized by:   Louis Meckel MD   Signed by:   Wyona Almas RN on 06/07/2009   Method used:   Electronically to        [facility] [address] (retail)       [address] [place], [place]  [address]       Ph: [phone]       Fax: [phone]   RxID:   [id number]

## 2010-05-13 NOTE — Letter (Signed)
Summary: MAILED REQUESTED EE RCORDS TO [facility]  MAILED REQUESTED EE RCORDS TO [facility]   Imported By: Arta Bruce 03/05/2010 14:09:18  _____________________________________________________________________  External Attachment:    Type:   Image     Comment:   External Document

## 2010-05-13 NOTE — Letter (Signed)
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  Gradual worsening of symptoms over years.  Procedures Requested: Evaluation and recommendations for treatment.  Candidate for corticosteroid injections?  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) 5)  ROUTINE GYNECOLOGICAL EXAMINATION (ICD-V72.31) 6)  HEALTH SCREENING (ICD-V70.0) 7)  HYPERTENSION, ESSENTIAL (ICD-401.9) 8)  FIBROIDS, UTERUS (ICD-218.9)   Current Medications: 1)  HYDROCHLOROTHIAZIDE 25 MG  TABS (HYDROCHLOROTHIAZIDE) 1 tab by mouth daily in morning 2)  TOPROL XL 25 MG XR24H-TAB (METOPROLOL SUCCINATE) 2 tabs by mouth daily 3)  CLOBETASOL PROPIONATE 0.05 % CREA (CLOBETASOL PROPIONATE) apply two times a day to patches 4)  TRAMADOL HCL 50 MG TABS (TRAMADOL HCL) 1 tab by mouth two times a day prn--save the second dose for bedtime-may only refill every 30 days   Past Medical History: 1)  LUMBAR RADICULOPATHY, LEFT (ICD-724.4) 2)  DYSHIDROTIC ECZEMA, 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  Sincerely,  Julieanne Manson MD

## 2010-05-13 NOTE — Progress Notes (Signed)
Summary: Query:  Refill terbinafine?  Phone Note Outgoing Call   Summary of Call: Do you want to refill her terbinafine?  Last seen 01/20/10. Initial call taken by: Dutch Quint RN,  February 10, 2010 4:11 PM  Follow-up for Phone Call        No, she had 12 weeks of treatment she has a f/u appt with Dr. Delrae Alfred on next week. Let her assess pt and if she feels necessary to extend treatment course she can do so at that time Follow-up by: Lehman Prom FNP,  February 11, 2010 7:46 AM  Additional Follow-up for Phone Call Additional follow up Details #1::        Left message on answer machine for pt. to return call. Gaylyn Cheers RN  February 13, 2010 12:03 PM   Pt. advised of provider's response and confirmed appt. with Dr. Delrae Alfred.  Dutch Quint RN  February 17, 2010 4:46 PM

## 2010-05-13 NOTE — Progress Notes (Signed)
  Phone Note Outgoing Call   Summary of Call: 1.  Was an HIV test done on patient when she was here at beginning of February?--unable to find.  If she did not have--needs to come in for that. 2.  Needs to come in for Hep B and C screening as well.  Partner with hx of Hep C.  Hep C antibodies, Hep B SAB, SAg, Core Ab.  I do not see a scanned in release for information from PHD in [place]--she signed one in clinical area I believe when last here--most of this in regard to recent positive RPR.  Pt. cannot recall if she signed anything.    Please have her sign one and fax ASAP to [facility] if unable to find a previous one.  Pt. is sexually active with a partner who has remained negative for syphilis. 3.  Called [place] PHD as have not received her records yet.  [phone].  Left message regarding any labs and OV for syphilis concern.  They may be calling also to let us know whether they received a release.  Initial call taken by: Julieanne Manson MD,  June 17, 2009 8:53 AM  Follow-up for Phone Call        Pt will be in monday for labs and to sign release of info. Follow-up by: Vesta Mixer CMA,  June 21, 2009 4:01 PM

## 2010-05-13 NOTE — Letter (Signed)
Summary: *HSN Results Follow up  [facility]  [address] [place], [place] [address]   Phone: [phone]  Fax: [phone]      07/21/2009   Shannon [name] [address] [name], [place]  [address]   Dear  [name],                            ____S.Drinkard,FNP   ____D. Gore,FNP       ____B. McPherson,MD   ____V. Rankins,MD    ___X_E. Barney Russomanno,MD    ____N. Daphine Deutscher, FNP  ____D. Reche Dixon, MD    ____K. Philipp Deputy, MD    ____Other     This letter is to inform you that your recent test(s):  _______Pap Smear    ___X____Lab Test     _______X-ray    ___X____ is within acceptable limits  _______ requires a medication change  _______ requires a follow-up lab visit  _______ requires a follow-up visit with your provider   Comments:  You tested negative for Hepatitis A, B, and C.  I would recommend getting immunized against A and B at the health dept.  If you call the [place] one, I believe they do it for free.  Not sure what the cost is here in  [place].  I still am waiting for records from [place] [facility].  I suspect you have been adequately treated for syphilis, however, especially since your partner is negative.       _________________________________________________________ If you have any questions, please contact our office                     Sincerely,  Julieanne Manson MD [facility]

## 2010-05-13 NOTE — Letter (Signed)
Summary: Previsit letter  [facility]  [address] [place], [place] [address]   Phone: [phone]  Fax: [phone]       05/23/2009 MRN: [id number]  [facility] [address] [name], [place]  [address]  Dear Ms. Kleiman,  Welcome to the Gastroenterology Division at [facility].    You are scheduled to see a nurse for your pre-procedure visit on 06-07-09 at 11:00a.m. on the 3rd floor at [facility], [address]  We ask that you try to arrive at our office 15 minutes prior to your appointment time to allow for check-in.  Your nurse visit will consist of discussing your medical and surgical history, your immediate family medical history, and your medications.    Please bring a complete list of all your medications or, if you prefer, bring the medication bottles and we will list them.  We will need to be aware of both prescribed and over the counter drugs.  We will need to know exact dosage information as well.  If you are on blood thinners (Coumadin, Plavix, Aggrenox, Ticlid, etc.) please call our office today/prior to your appointment, as we need to consult with your physician about holding your medication.   Please be prepared to read and sign documents such as consent forms, a financial agreement, and acknowledgement forms.  If necessary, and with your consent, a friend or relative is welcome to sit-in on the nurse visit with you.  Please bring your insurance card so that we may make a copy of it.  If your insurance requires a referral to see a specialist, please bring your referral form from your primary care physician.  No co-pay is required for this nurse visit.     If you cannot keep your appointment, please call [phone] to cancel or reschedule prior to your appointment date.  This allows [place] the opportunity to schedule an appointment for another patient in need of care.    Thank you for choosing [facility] for your  medical needs.  We appreciate the opportunity to care for you.  Please visit [place] at our website  to learn more about our practice.                     Sincerely.                                                                                                                   The Gastroenterology Division

## 2010-05-13 NOTE — Letter (Signed)
Summary: *HSN Results Follow up  [facility]  [address] [place], [place] [address]   Phone: [phone]  Fax: [phone]      11/15/2009   SAUMYA HUKILL [address] [name], [place]  [address]   Dear  [name],                            ____S.Drinkard,FNP   ____D. Gore,FNP       ____B. McPherson,MD   ____V. Rankins,MD    __X__E. Mulberry,MD    ____N. Daphine Deutscher, FNP  ____D. Reche Dixon, MD    ____K. Philipp Deputy, MD    ____Other     This letter is to inform you that your recent test(s):  _______Pap Smear    _______Lab Test     _______X-ray    _______ is within acceptable limits  _______ requires a medication change  _______ requires a follow-up lab visit  _______ requires a follow-up visit with your provider   Comments:  We tried to reach you at [phone].  Please give our office a call when you receive this letter.  Thank you.       _________________________________________________________ If you have any questions, please contact our office                     Sincerely,  Vesta Mixer CMA [facility]

## 2010-05-13 NOTE — Progress Notes (Signed)
Summary: Refill request Hydrocodone  Phone Note Call from Patient   Reason for Call: Refill Medication Summary of Call: NEEDS HYDROCODONE REFILL/OUT OF MED//PHONE [phone] Initial call taken by: Arta Bruce,  March 11, 2010 2:27 PM  Follow-up for Phone Call        Forward to Dr. Delrae Alfred Last seen 01/21/10, Last filled 01/21/10 #60 no refills. Follow-up by: Gaylyn Cheers RN,  March 11, 2010 3:10 PM  Additional Follow-up for Phone Call Additional follow up Details #1::        Please check with pt. and see if she went to Dr. Newell Coral since last here and what the plan is. Please call Dr. Earl Gala office and see if they are prescribing her any pain meds.  YES PT WENT TO THE APP ON 03-11-10 WITH DR NUDELMAN....Domenic Polite   Additional Follow-up by: Julieanne Manson MD,  March 11, 2010 4:04 PM    Additional Follow-up for Phone Call Additional follow up Details #2::    Per Dr. Ander Purpura office; Pt. seen today and has been seeing him regularly. Medical Records could not tell me if he gave her any pain medication because the visit has not been dictated, however they stated "it does not look like he has been giving her any." Follow-up by: Gaylyn Cheers RN,  March 11, 2010 4:58 PM  Additional Follow-up for Phone Call Additional follow up Details #3:: Details for Additional Follow-up Action Taken: MS [place] CALLED [facility] HER PAIN MED.[facility]  March 12, 2010 2:58 PM  So, what is the plan regarding her radiculopathy?  Have they reset a date for surgery?  Our last discussion was that we would fill pain meds until she went back to get surgery rescheduled.  Julieanne Manson MD  March 13, 2010 2:00 PM  CALLED PT FOR REMINDER  OF APPOINTMENT SHE WANTS TO KNOW WHERE HER PAIN MED IS AND WHY SHE HAS NOT BEEN CALLED BACK//HOUSE # [phone] OR [phone]  Discussed at OV today  Julieanne Manson MD  March 17, 2010 4:21 PM  Additional Follow-up by: Arta Bruce,  March 14, 2010 12:38 PM

## 2010-05-13 NOTE — Letter (Signed)
Summary: *Referral Letter  [facility]  [address] [place], [place] [address]   Phone: [phone]  Fax: [phone]    04/26/2009  Thank you in advance for agreeing to see my patient:  Shannon [name] [address] 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  To take regularly at least two times a day with food. 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  Sincerely,  Julieanne Manson MD

## 2010-05-13 NOTE — Assessment & Plan Note (Signed)
Summary: 6 month fu////kt   Vital Signs:  Patient profile:   55 year old female Menstrual status:  hysterectomy Height:      64 inches Weight:      189.5 pounds Temp:     98.0 degrees F oral Pulse rate:   60 / minute Pulse rhythm:   regular Resp:     20 per minute BP sitting:   138 / 102  (left arm) Cuff size:   large  Vitals Entered By: Michelle Nasuti (December 17, 2009 10:03 AM) CC: follow-up visit Pain Assessment Patient in pain? yes      Intensity: 8  Does patient need assistance? Functional Status Self care Ambulation Normal   CC:  follow-up visit.  History of Present Illness: 1.  Hypertension:  States taking meds regularly.  HR in 60 range.  Has never been on Lisinopril in past.  Weight is up almost 10 lbs--relates to her back pain--not physically active.  2.  Back Pain:  Pt. states she cannot do anything secondary to the pain.  She does not feel PT helped in the past and is not interested in going through that again.  She does not have access to a pool.  She did not really respond to epidural injections ? X3.  She would like to go back to Dr. Newell Coral and discuss going ahead with surgery.  Pt. still smoking 1/2 ppd.  Discussed she would need to quit for the surgery.  She is willing to try the patches. With surgery in mind:  Pt. denies any chest pain.  She does have some dyspnea when walking, but also has a lot of pain with walking and that's when she has the most difficulty.  No orthopnea or PND symptoms.  No swelling of ankles or feet.    3.  Toenail onychomycosis:  Pt. cannot recall when she started taking her Lamisil.  Sounds like she has filled the medication twice.  Is spraying her shoes regularly with an antifungal spray.   Area of ring worm has resolved.  Current Medications (verified): 1)  Hydrochlorothiazide 25 Mg  Tabs (Hydrochlorothiazide) .Marland Kitchen.. 1 Tab By Mouth Daily in Morning 2)  Toprol Xl 25 Mg Xr24h-Tab (Metoprolol Succinate) .... 2 Tabs By Mouth  Daily 3)  Clobetasol Propionate 0.05 % Crea (Clobetasol Propionate) .... Apply Two Times A Day To Patches 4)  Tramadol Hcl 50 Mg Tabs (Tramadol Hcl) .Marland Kitchen.. 1 Tab By Mouth Two Times A Day Prn--Save The Second Dose For Bedtime-May Only Refill Every 30 Days 5)  Terbinafine Hcl 250 Mg Tabs (Terbinafine Hcl) .Marland Kitchen.. 1 Tab By Mouth Daily For 12 Weeks.  Allergies (verified): No Known Drug Allergies  Physical Exam  General:  Uncomfortable with sitting. Lungs:  Normal respiratory effort, chest expands symmetrically. Lungs are clear to auscultation, no crackles or wheezes. Heart:  Normal rate and regular rhythm. S1 and S2 normal without gallop, murmur, click, rub or other extra sounds.  Radial pulses normal and equal Skin:  Toenails on right foot all involved with thickened yellowing save for 2nd toe.  Nails are quite long.   Less involved on left.  All of involved nails involved to base.  No new healthy appearing nail at base.   Impression & Recommendations:  Problem # 1:  RINGWORM (ICD-110.9) Resolved.  Problem # 2:  ONYCHOMYCOSIS, TOENAILS (ICD-110.1) Not clear how long she has been taking--suspect not for more than 1 1/2 months--pt. to call with when she started. Will also call pharmacy Her  updated medication list for this problem includes:    Terbinafine Hcl 250 Mg Tabs (Terbinafine hcl) .Marland Kitchen... 1 tab by mouth daily for 12 weeks.  Problem # 3:  LUMBAR RADICULOPATHY, LEFT (ICD-724.4) Pt. requests something more than Tramadol for pain control and to go back to Dr. Newell Coral for surgery The following medications were removed from the medication list:    Tramadol Hcl 50 Mg Tabs (Tramadol hcl) .Marland Kitchen... 1 tab by mouth two times a day prn--save the second dose for bedtime-may only refill every 30 days Her updated medication list for this problem includes:    Hydrocodone-acetaminophen 5-500 Mg Tabs (Hydrocodone-acetaminophen) .Marland Kitchen... 1 tab by mouth two times a day for pain as needed  Orders: Neurosurgeon  Referral (Neurosurgeon)  Problem # 4:  HYPERTENSION, ESSENTIAL (ICD-401.9) Add Lisinopril EKG also for preop evaluation--normal. Nothing in repiratory or cardiac history today to consider further work up- Her updated medication list for this problem includes:    Hydrochlorothiazide 25 Mg Tabs (Hydrochlorothiazide) .Marland Kitchen... 1 tab by mouth daily in morning    Toprol Xl 25 Mg Xr24h-tab (Metoprolol succinate) .Marland Kitchen... 2 tabs by mouth daily    Lisinopril 10 Mg Tabs (Lisinopril) .Marland Kitchen... 1 tab by mouth daily  Orders: EKG w/ Interpretation (93000)  Problem # 5:  TOBACCO ABUSE (ICD-305.1) Try patches--needs to stop prior to surgery. Her updated medication list for this problem includes:    Nicotine 21 Mg/24hr Pt24 (Nicotine) .Marland Kitchen... Apply to different area of skin each day when replace for 4 weeks, then decrease to 14 mg patches for 2 weeks    Nicotine 14 Mg/24hr Pt24 (Nicotine) .Marland Kitchen... Decrease to 14 mg patches after 30 days of 21 mg.  change daily to skin for 14 days    Nicotine 7 Mg/24hr Pt24 (Nicotine) .Marland Kitchen... Decrease to 7 mg from 14 mg after 2 weeks.  apply to skin daily for 14 days  Complete Medication List: 1)  Hydrochlorothiazide 25 Mg Tabs (Hydrochlorothiazide) .Marland Kitchen.. 1 tab by mouth daily in morning 2)  Toprol Xl 25 Mg Xr24h-tab (Metoprolol succinate) .... 2 tabs by mouth daily 3)  Clobetasol Propionate 0.05 % Crea (Clobetasol propionate) .... Apply two times a day to patches 4)  Terbinafine Hcl 250 Mg Tabs (Terbinafine hcl) .Marland Kitchen.. 1 tab by mouth daily for 12 weeks. 5)  Lisinopril 10 Mg Tabs (Lisinopril) .Marland Kitchen.. 1 tab by mouth daily 6)  Nicotine 21 Mg/24hr Pt24 (Nicotine) .... Apply to different area of skin each day when replace for 4 weeks, then decrease to 14 mg patches for 2 weeks 7)  Nicotine 14 Mg/24hr Pt24 (Nicotine) .... Decrease to 14 mg patches after 30 days of 21 mg.  change daily to skin for 14 days 8)  Nicotine 7 Mg/24hr Pt24 (Nicotine) .... Decrease to 7 mg from 14 mg after 2 weeks.  apply to  skin daily for 14 days 9)  Hydrocodone-acetaminophen 5-500 Mg Tabs (Hydrocodone-acetaminophen) .Marland Kitchen.. 1 tab by mouth two times a day for pain as needed  Patient Instructions: 1)  Nurse visit for blood pressure check and BMET--recent start of Lisinopril 2)  Follow up with Dr. Delrae Alfred in 2 months --hypertension Prescriptions: HYDROCODONE-ACETAMINOPHEN 5-500 MG TABS (HYDROCODONE-ACETAMINOPHEN) 1 tab by mouth two times a day for pain as needed  #60 x 0   Entered and Authorized by:   Julieanne Manson MD   Signed by:   Julieanne Manson MD on 12/17/2009   Method used:   Print then Give to Patient   RxID:   [id number] NICOTINE 7 MG/24HR  PT24 (NICOTINE) Decrease to 7 mg from 14 mg after 2 weeks.  Apply to skin daily for 14 days  #14 x 0   Entered and Authorized by:   Julieanne Manson MD   Signed by:   Julieanne Manson MD on 12/17/2009   Method used:   Electronically to        [facility] [id number] (retail)       [address] [place], [place]  [address]       Ph: [phone]       Fax: [phone]   RxID:   [id number] NICOTINE 14 MG/24HR PT24 (NICOTINE) Decrease to 14 mg patches after 30 days of 21 mg.  Change daily to skin for 14 days  #14 x 0   Entered and Authorized by:   Julieanne Manson MD   Signed by:   Julieanne Manson MD on 12/17/2009   Method used:   Electronically to        [facility] [address] (retail)       [address] [place], [place]  [address]       Ph: [phone]       Fax: [phone]   RxID:   [id number] NICOTINE 21 MG/24HR PT24 (NICOTINE) apply to different area of skin each day when replace for 4 weeks, then decrease to 14 mg patches for 2 weeks  #30 x 0   Entered and Authorized by:   Julieanne Manson MD   Signed by:   Julieanne Manson MD on 12/17/2009   Method used:   Electronically to        [facility] [address] (retail)       [address]       [place], [place]  [address]       Ph: [phone]       Fax: [phone]   RxID:   [id number] LISINOPRIL 10 MG TABS (LISINOPRIL) 1 tab by mouth daily  #30 x 4   Entered and Authorized by:   Julieanne Manson MD   Signed by:   Julieanne Manson MD on 12/17/2009   Method used:   Electronically to        [facility] [address] (retail)       [address]       [place], [place]  [address]       Ph: [phone]       Fax: [phone]   RxID:   [id number]   Appended Document: 6 month fu////kt Obtained medication refill records from pharmacy--appears she may have missed a couple of days at beginnin of August with bp meds, but has been consistent at least with refilling for most part

## 2010-05-13 NOTE — Letter (Signed)
[facility]   Imported By: Arta Bruce 08/22/2009 11:51:04  _____________________________________________________________________  External Attachment:    Type:   Image     Comment:   External Document

## 2010-05-13 NOTE — Letter (Signed)
Summary: Generic Letter  [facility]  [address] [place], [place] [address]   Phone: [phone]  Fax: [phone]    03/17/2010  Re:  Shannon [name]      [address] [name], [place]  [address]  Dear Dr. Newell Coral:  Sorry for the delay in getting [name]'s BP under control.  She is having difficulties recognizing whether she is filling her bp meds or not.  Her bp was fine when seen in September, but when she returned in December, she was not taking her HCTZ.  She was not aware she was missing one of her regular meds.  We're working on it.         Sincerely,   Julieanne Manson MD

## 2010-05-13 NOTE — Letter (Signed)
Summary: MED/SOLUTIONS//APPROVED  MED/SOLUTIONS//APPROVED   Imported By: Arta Bruce 06/10/2009 14:45:48  _____________________________________________________________________  External Attachment:    Type:   Image     Comment:   External Document

## 2010-05-13 NOTE — Assessment & Plan Note (Signed)
Summary: CPP EXAM//GK   Vital Signs:  Patient profile:   55 year old female Menstrual status:  hysterectomy Weight:      184.3 pounds Temp:     97.8 degrees F oral Pulse rate:   75 / minute Pulse rhythm:   regular Resp:     17 per minute BP sitting:   138 / 92  (left arm) Cuff size:   large  Vitals Entered By: Geanie Cooley  (April 26, 2009 10:38 AM) CC:   pt here for CPP, pt states the lower back pain still bothers her. Pt states her middle finger on her left hand swells up alot, pt states it was a knot on it and when she busted it nothing but pus came out of it.  Pt states she did physical therapy for her back but it didnt help her much, she states the pain shoots all down through her legs. Pt states she does feel numkbness and tingling through her legs from the back pain. Pt also states she had surgery on her left  leg a few yeaqrs ago, and it hurts alot. Pt states that when she first get out the bed she has to drag that leg it hurts so bad and it feel heavy. Is Patient Diabetic? No Pain Assessment Patient in pain? yes     Location: lower back Intensity: 8 Type: stinging  Does patient need assistance? Functional Status Self care Ambulation Normal   CC:    pt here for CPP, pt states the lower back pain still bothers her. Pt states her middle finger on her left hand swells up alot, pt states it was a knot on it and when she busted it nothing but pus came out of it.  Pt states she did physical therapy for her back but it didnt help her much, she states the pain shoots all down through her legs. Pt states she does feel numkbness and tingling through her legs from the back pain. Pt also states she had surgery on her left  leg a few yeaqrs ago, and and it hurts alot. Pt states that when she first get out the bed she has to drag that leg it hurts so bad and it feel heavy..  History of Present Illness: 55 yo female here for CPP.  Concerns:  1.  Low Back Pain:  Had Xray of low back  in [place]--maybe 3 years ago--told was normal.  Radicular symptoms with pain, numbness and tingling down left leg.  Pain causes her to drag the leg.  No definite weakness.  PT not very helpful as above.  Pins and screws in left knee area from ORIF when in 30s--fell out of a tree.  2.  Left middle finger with what pt. described as paronychia--popped and pus came out.  Happened last month and still with discomfort.  Has been picking at cuticle in that area as well.  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 1.  2006:  TAH with USO for what sound like fibroids--[facility] in [place] 2.  1980s:  ORIF of ?tibia fracture.  Family History: Reviewed history from 11/17/2007 and no changes required. Mother, died 54:  stroke, Htn Father, died 28:  MI, DM, Htn 5 Brothers:  2 have died:  one with unknown type cancer, one from MI--50s, rest healthy 7 Sisters:  3 have died:  one from congenital heart disease at birth, one from MVA, one from unknown health problems.  Rest are healthy 2 Sons, ages 62 and 61:  both healthy  Social History: Reviewed history from 11/06/2008 and no changes required. Divorced Lives with boyfriend 35 yo son now lives with patient Previously worked in housekeeping. Unemployed currently. Tobacco Use:  Started age 50.  Smokes 1/2 ppd Alcohol Use:  Beer once weekly Drug Use :  never.  Review of Systems General:  Energy is fine. Eyes:  Glasses. ENT:  Denies decreased hearing. CV:  Denies chest pain or discomfort and palpitations. Resp:  Denies shortness of breath. GI:  Denies abdominal pain, bloody stools,  constipation, dark tarry stools, and diarrhea. GU:  Denies discharge, dysuria, and urinary frequency. MS:  See HPI. Derm:  Denies lesion(s) and rash. Neuro:  See HPI.  Physical Exam  General:  Overweight, NAD Head:  Normocephalic and atraumatic without obvious abnormalities. Eyes:  No corneal or conjunctival inflammation noted. EOMI. Perrla. Funduscopic exam benign, without hemorrhages, exudates or papilledema. Vision grossly normal. Ears:  External ear exam shows no significant lesions or deformities.  Otoscopic examination reveals clear canals, tympanic membranes are intact bilaterally without bulging, retraction, inflammation or discharge. Hearing is grossly normal bilaterally. Nose:  External nasal examination shows no deformity or inflammation. Nasal mucosa are pink and moist without lesions or exudates. Mouth:  Oral mucosa and oropharynx without lesions or exudates.  fair dentition.   Neck:  No deformities, masses, or tenderness noted. Breasts:  No mass, nodules, thickening, tenderness, bulging, retraction, inflamation, nipple discharge or skin changes noted.   Lungs:  Normal respiratory effort, chest expands symmetrically. Lungs are clear to auscultation, no crackles or wheezes. Heart:  Normal rate and regular rhythm. S1 and S2 normal without gallop, murmur, click, rub or other extra sounds. Abdomen:  Bowel sounds positive,abdomen soft and non-tender without masses, organomegaly or hernias noted. Rectal:  No external abnormalities noted. Normal sphincter tone. No rectal masses or tenderness.  Heme negative light brown stool Genitalia:  Pelvic Exam:        External: normal female genitalia without lesions or masses        Vagina: normal without lesions or masses        Adnexa: normal bimanual exam without masses or fullness        Uterus: absent        Pap smear: not performed Msk:  No deformity or scoliosis noted of thoracic or lumbar spine.   Pulses:  R and L  carotid,radial,femoral,dorsalis pedis and posterior tibial pulses are full and equal bilaterally Extremities:  Surgical scars about left knee Neurologic:  No cranial nerve deficits noted. Station and gait are normal. Plantar reflexes are down-going bilaterally. DTRs are symmetrical throughout--unalbe to elicit left patellar--scar tissue and difficulty finding tendon. Sensory, motor and coordinative functions appear intact. Skin:  Dry--cuticle left middle finger pulled away from nail and split--very dry.  No discharge or erythema Cervical Nodes:  No lymphadenopathy noted Axillary Nodes:  No palpable lymphadenopathy Inguinal Nodes:  No significant adenopathy Psych:  Cognition and judgment appear intact. Alert and cooperative with normal attention span and concentration. No apparent delusions, illusions, hallucinations   Impression & Recommendations:  Problem # 1:  ROUTINE GYNECOLOGICAL EXAMINATION (ICD-V72.31)  Mammogram and immunizations up to date Guaiac cards x3 to return in 2 weeks Schedule colonoscopy screen  Orders: T-HIV Antibody  (  Reflex) [id number]) T-Syphilis Test (RPR) [phone]) T-GC Probe, urine [id number]) T-Chlamydia  Probe, urine [id number])  Problem # 2:  LUMBAR RADICULOPATHY, LEFT (ICD-724.4)  Will see if can get set up with MRI--has hardware in left knee. Her updated medication list for this problem includes:    Ibuprofen 600 Mg Tabs (Ibuprofen) .Marland Kitchen... 1 tab by mouth q 6 hours as needed pain.  to take regularly at least two times a day with food.  Orders: Diagnostic X-Ray/Fluoroscopy (Diagnostic X-Ray/Flu) MRI without Contrast (MRI w/o Contrast)  Problem # 3:  HYPERTENSION, ESSENTIAL (ICD-401.9)  Up a bit--come back for recheck Pt. states compliance with meds Her updated medication list for this problem includes:    Hydrochlorothiazide 25 Mg Tabs (Hydrochlorothiazide) .Marland Kitchen... 1 tab by mouth daily in morning    Toprol Xl 25 Mg Xr24h-tab (Metoprolol  succinate) .Marland Kitchen... 2 tabs by mouth daily  Orders: UA Dipstick w/o Micro (manual) (84166) T-Comprehensive Metabolic Panel ([id number])  Complete Medication List: 1)  Hydrochlorothiazide 25 Mg Tabs (Hydrochlorothiazide) .Marland Kitchen.. 1 tab by mouth daily in morning 2)  Toprol Xl 25 Mg Xr24h-tab (Metoprolol succinate) .... 2 tabs by mouth daily 3)  Ibuprofen 600 Mg Tabs (Ibuprofen) .Marland Kitchen.. 1 tab by mouth q 6 hours as needed pain.  to take regularly at least two times a day with food. 4)  Clobetasol Propionate 0.05 % Crea (Clobetasol propionate) .... Apply two times a day to patches  Other Orders: Flu Vaccine 67yrs + [phone]) Admin 1st Vaccine ([address]) Admin 1st Vaccine [facility]) [phone]) T-CBC w/Diff [phone]) Gastroenterology Referral (GI)  Laboratory Results   Urine Tests  Date/Time Received: April 26, 2009 11:20 AM   Routine Urinalysis   Color: yellow Appearance: Clear Glucose: negative   (Normal Range: Negative) Bilirubin: small   (Normal Range: Negative) Ketone: negative   (Normal Range: Negative) Spec. Gravity: >=1.030   (Normal Range: 1.003-1.035) Blood: trace-lysed   (Normal Range: Negative) pH: 5.0   (Normal Range: 5.0-8.0) Protein: 30   (Normal Range: Negative) Urobilinogen: 0.2   (Normal Range: 0-1) Nitrite: negative   (Normal Range: Negative) Leukocyte Esterace: negative   (Normal Range: Negative)        Preventive Care Screening  Prior Values:    Mammogram:  ASSESSMENT: Negative - BI-RADS 1^MM DIGITAL SCREENING (11/26/2008)    Last Tetanus Booster:  Tdap (11/17/2007)     Immunizations:  Received flu shot already today. Last pap:  S/P hysterectomy--cannot recall last--always normal\par SBE:  Monthly--no changes Colonoscopy:  Never Guaiac Cards:  States she did last year, but does not appear so by chart. Osteoprevention:  2 cups milk daily.  Daily cheese.  No yogurt.  Not physically active.  Does housework.   Influenza Vaccine    Vaccine Type: Fluvax 3+    Site:  right deltoid    Mfr: Sanofi Pasteur    Route: IM    Given by: Geanie Cooley    Exp. Date: 10/10/2009    Lot #: [id number]    VIS given: 11/04/06 version given April 26, 2009.  Flu Vaccine Consent Questions    Do you have a history of severe allergic reactions to this vaccine? no    Any prior history of allergic reactions to egg and/or gelatin? no    Do you have a sensitivity to the preservative Thimersol? no    Do you have a past history of Guillan-Barre Syndrome? no    Do you currently have an acute febrile illness? no    Have you ever had a severe reaction  to latex? no    Vaccine information given and explained to patient? yes    Are you currently pregnant? no

## 2010-05-13 NOTE — Letter (Signed)
Summary: REFERRAL//NEUROSURGEON  REFERRAL//NEUROSURGEON   Imported By: Arta Bruce 07/04/2009 12:47:30  _____________________________________________________________________  External Attachment:    Type:   Image     Comment:   External Document

## 2010-05-13 NOTE — Assessment & Plan Note (Signed)
Summary: BLOOD PRESSURE RUNNING HIGH//SS   Vital Signs:  Patient profile:   55 year old female Menstrual status:  hysterectomy Weight:      190 pounds BMI:     32.73 Temp:     97.2 degrees F oral Pulse rate:   72 / minute Pulse rhythm:   regular Resp:     20 per minute BP sitting:   184 / 108  (left arm)  Vitals Entered By: Hale Drone CMA (March 17, 2010 3:29 PM) CC: BP concners. Denies SOB, ankle swelling, chest pains but does have HA's x3 months.  Is Patient Diabetic? No Pain Assessment Patient in pain? yes     Location: back Intensity: 6 Type: Aching/sharp Onset of pain  Constant  Does patient need assistance? Functional Status Self care Ambulation Normal   CC:  BP concners. Denies SOB, ankle swelling, and chest pains but does have HA's x3 months. .  History of Present Illness: 1.  Hypertension:  BP back up.  Surgery still on hold as her bp was still up when she saw him.  Pt. states she has not missed any of her meds for any reason since she was last in.  Later, becomes evident that pt. not taking HCTZ--called pharmacy and has not filled since August.  Sounds like she has not been filling much this year.  2.  Toenail onychomycosis:  Finished the Lamisil.  Toenails looking better.  Is spraying shoes regularly.  3.  Left lumbar radiculopathy:  needs pain meds now until January 31 when sees Dr. Newell Coral again.     Current Medications (verified): 1)  Hydrochlorothiazide 25 Mg  Tabs (Hydrochlorothiazide) .Marland Kitchen.. 1 Tab By Mouth Daily in Morning 2)  Toprol Xl 25 Mg Xr24h-Tab (Metoprolol Succinate) .... 2 Tabs By Mouth Daily 3)  Clobetasol Propionate 0.05 % Crea (Clobetasol Propionate) .... Apply Two Times A Day To Patches 4)  Terbinafine Hcl 250 Mg Tabs (Terbinafine Hcl) .Marland Kitchen.. 1 Tab By Mouth Daily For 12 Weeks. 5)  Lisinopril 10 Mg Tabs (Lisinopril) .Marland Kitchen.. 1 Tab By Mouth Daily 6)  Nicotine 21 Mg/24hr Pt24 (Nicotine) .... Apply To Different Area of Skin Each Day When Replace  For 4 Weeks, Then Decrease To 14 Mg Patches For 2 Weeks 7)  Nicotine 14 Mg/24hr Pt24 (Nicotine) .... Decrease To 14 Mg Patches After 30 Days of 21 Mg.  Change Daily To Skin For 14 Days 8)  Nicotine 7 Mg/24hr Pt24 (Nicotine) .... Decrease To 7 Mg From 14 Mg After 2 Weeks.  Apply To Skin Daily For 14 Days 9)  Hydrocodone-Acetaminophen 5-500 Mg Tabs (Hydrocodone-Acetaminophen) .Marland Kitchen.. 1 Tab By Mouth Two Times A Day For Pain As Needed  Allergies (verified): No Known Drug Allergies  Physical Exam  Lungs:  Normal respiratory effort, chest expands symmetrically. Lungs are clear to auscultation, no crackles or wheezes. Heart:  Normal rate and regular rhythm. S1 and S2 normal without gallop, murmur, click, rub or other extra sounds.  Radial pulses normal and equal Skin:  Toenails look fairly normal at bases.  Still with significant distal involvement with flaking, thickening, discoloration.   Impression & Recommendations:  Problem # 1:  ONYCHOMYCOSIS, TOENAILS (ICD-110.1) Somewhat improved. Local care now. The following medications were removed from the medication list:    Terbinafine Hcl 250 Mg Tabs (Terbinafine hcl) .Marland Kitchen... 1 tab by mouth daily for 12 weeks.  Problem # 2:  HYPERTENSION, ESSENTIAL (ICD-401.9) To get started back on HCTZ and follow up only for bp  Her  updated medication list for this problem includes:    Hydrochlorothiazide 25 Mg Tabs (Hydrochlorothiazide) .Marland Kitchen... 1 tab by mouth daily in morning    Toprol Xl 25 Mg Xr24h-tab (Metoprolol succinate) .Marland Kitchen... 2 tabs by mouth daily    Lisinopril 10 Mg Tabs (Lisinopril) .Marland Kitchen... 1 tab by mouth daily  Problem # 3:  LUMBAR RADICULOPATHY, LEFT (ICD-724.4) Will fill only intil sees Dr. Newell Coral to have surgery Her updated medication list for this problem includes:    Hydrocodone-acetaminophen 5-500 Mg Tabs (Hydrocodone-acetaminophen) .Marland Kitchen... 1 tab by mouth two times a day for pain as needed  Complete Medication List: 1)  Hydrochlorothiazide 25 Mg  Tabs (Hydrochlorothiazide) .Marland Kitchen.. 1 tab by mouth daily in morning 2)  Toprol Xl 25 Mg Xr24h-tab (Metoprolol succinate) .... 2 tabs by mouth daily 3)  Clobetasol Propionate 0.05 % Crea (Clobetasol propionate) .... Apply two times a day to patches 4)  Lisinopril 10 Mg Tabs (Lisinopril) .Marland Kitchen.. 1 tab by mouth daily 5)  Nicotine 21 Mg/24hr Pt24 (Nicotine) .... Apply to different area of skin each day when replace for 4 weeks, then decrease to 14 mg patches for 2 weeks 6)  Nicotine 14 Mg/24hr Pt24 (Nicotine) .... Decrease to 14 mg patches after 30 days of 21 mg.  change daily to skin for 14 days 7)  Nicotine 7 Mg/24hr Pt24 (Nicotine) .... Decrease to 7 mg from 14 mg after 2 weeks.  apply to skin daily for 14 days 8)  Hydrocodone-acetaminophen 5-500 Mg Tabs (Hydrocodone-acetaminophen) .Marland Kitchen.. 1 tab by mouth two times a day for pain as needed  Patient Instructions: 1)  NUrse visit for bp check--pt. to fill her HCTZ--was not taking with Lisinopril and Toprol Prescriptions: HYDROCODONE-ACETAMINOPHEN 5-500 MG TABS (HYDROCODONE-ACETAMINOPHEN) 1 tab by mouth two times a day for pain as needed  #60 x 0   Entered and Authorized by:   Julieanne Manson MD   Signed by:   Julieanne Manson MD on 03/17/2010   Method used:   Print then Give to Patient   RxID:   [id number]    Orders Added: 1)  Est. Patient Level IV [48546]

## 2010-05-13 NOTE — Progress Notes (Signed)
Summary: INFORMATION FOR THE DOCTOR  Phone Note Call from Patient   Reason for Call: Talk to Doctor Summary of Call: PT WANTED TO LET THE DOCTOR NKOW WHEN SHE PICKED UP HER MEDS FIRST DAY WAS JUNE THE 30TH AND SECOND WAS AUGUST THE 2ND TERBINFIN Initial call taken by: Oscar La,  December 17, 2009 2:29 PM  Follow-up for Phone Call        FWWD to provder Follow-up by: Michelle Nasuti,  December 17, 2009 2:46 PM  Additional Follow-up for Phone Call Additional follow up Details #1::        She should have 1 month left of treatment then--may be in another refill--needs to make sure she takes for 12 weeks.   Make sure she sprays shoes daily and lets dry. Additional Follow-up by: Julieanne Manson MD,  December 17, 2009 4:10 PM    Additional Follow-up for Phone Call Additional follow up Details #2::    NUMBER [phone] D/C LETTER MAILED Follow-up by: Michelle Nasuti,  December 19, 2009 10:21 AM

## 2010-05-13 NOTE — Progress Notes (Signed)
Summary: pain medications/ medical records  Phone Note Call from Patient   Summary of Call: pt was in office for lab visit and mention that he tramadol is not helping her back pain and she would like to know if she can have somethng else for pain.... d[facility] drug [facility]... Initial call taken by: [place] Shannon,  July 17, 2009 10:39 AM  Follow-up for Phone Call        I thought she would have seen the Neurosurgeon by now, but she rescheduled her appt. to later this month. If she is not tolerating pain, then she needs to make another appt. to discuss.  She has been on current medication since February visit. Have we received anything yet from [facility] PHD? Follow-up by: Julieanne Manson MD,  July 17, 2009 10:34 PM  Additional Follow-up for Phone Call Additional follow up Details #1::        phone has been disconnected, letter mailed. Will forward on the [facility] to see if medical records for [facility] have arrived. Noted after letter written pain medication had already been addressed. Will not mail letter. Additional Follow-up by: Gaylyn Cheers RN,  July 30, 2009 11:49 AM    Additional Follow-up for Phone Call Additional follow up Details #2::    WILL REQUEST RECORDS AGAIN Follow-up by: Arta Bruce,  July 31, 2009 9:22 AM  Additional Follow-up for Phone Call Additional follow up Details #3:: Details for Additional Follow-up Action Taken: FAXED BACK TO [place] THEY HAVE NO RECORDS FOR THIS PT  Would still send out letter that she needs to be seen if needs pain meds  Julieanne Manson MD  Aug 11, 2009 2:16 PM   Will send letter to pt.......... Vesta Mixer CMA  Aug 12, 2009 10:12 AM  Additional Follow-up by: Arta Bruce,  July 31, 2009 9:59 AM

## 2010-05-13 NOTE — Assessment & Plan Note (Signed)
Summary: 2wk f/u////rjp   Vital Signs:  Patient profile:   55 year old female Menstrual status:  hysterectomy Weight:      187 pounds Temp:     97.1 degrees F Pulse rate:   76 / minute Pulse rhythm:   regular Resp:     18 per minute BP sitting:   156 / 96  (left arm) Cuff size:   regular  Vitals Entered By: Vesta Mixer CMA (May 14, 2009 10:10 AM) CC: 2 week f/u back is still hurting her real bad.  Does not know about last lab results. Is Patient Diabetic? No Pain Assessment Patient in pain? yes     Location: back Intensity: 9  Does patient need assistance? Ambulation Normal   CC:  2 week f/u back is still hurting her real bad.  Does not know about last lab results..  History of Present Illness: 1.  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 states was diagnosed in [place]--PHD--thinks in the late 90s.  Pt. describes a pruritic lesion that peeled and bled when she scratched at it at the time--states subsequent blood work returned positive for syphilis.  Took about 1 month for lesion to resolve--she's not sure about this.  Still with current boyfriend at the time--they have been together since 1992.  She believes he was treated then as well.  He did not apparently have any symptoms--she cannot say whether he tested positive or not.  Both received one shot of penicillin at the time.  Never developed any other rash.  Pt. cannot recall going back to have repeat titres or blood work run.    No history of eye discomfort or problems with vision, no hearing loss.  No definite problem with balance--does have pain in low back radiating to legs.    Pt. is married to another man, originally from [place] has not been with him since 1983.  He no longer lives in the [place] as far as she knows.  He did not have any symptoms of syphilis as far as she knows.  Pt. states she and her boyfriend are monogamous.  2.  Radicular back pain:  Left leg worse than right--has been a problem  for many years--just seems to be progressively worsening.  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  Pt. really feels this is affecting her ability to work and function.  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 PHD, [place]--pt. will sign records Attempted to call her boyfriend:  Junie Panning at [phone]--QI answer.  Problem # 2:  LUMBAR RADICULOPATHY, LEFT (ICD-724.4)  Ibuprofen not working--start Tramadol The following medications were removed from the medication list:    Ibuprofen 600 Mg Tabs (Ibuprofen) .Marland Kitchen... 1 tab by mouth q 6 hours as needed pain.  to take regularly at least two times a day with food. Her updated medication list for this problem includes:    Tramadol Hcl 50 Mg Tabs (Tramadol hcl) .Marland Kitchen... 1 tab by mouth two times a day prn--save the second dose for bedtime-may only refill every 30 days  Orders: Neurosurgeon Referral (Neurosurgeon)  Complete Medication List: 1)  Hydrochlorothiazide 25 Mg Tabs (Hydrochlorothiazide) .Marland Kitchen.. 1 tab by mouth daily in morning 2)  Toprol Xl 25 Mg Xr24h-tab (Metoprolol succinate) .... 2 tabs by mouth daily  3)  Clobetasol Propionate 0.05 % Crea (Clobetasol propionate) .... Apply two times a day to patches 4)  Tramadol Hcl 50 Mg Tabs (Tramadol hcl) .Marland Kitchen.. 1 tab by mouth two times a day prn--save the second dose for bedtime-may only refill every 30 days  Patient Instructions: 1)  Call if you do not hear from Dr. Delrae Alfred in next 2 weeks--please have MR. Cowan call to let me know when best to contact him--would also like a release of his information to make sure both of you were adequately treated if he is willing Prescriptions: TRAMADOL HCL 50 MG TABS (TRAMADOL HCL) 1 tab by mouth two times a day prn--save the second  dose for bedtime-may only refill every 30 days  #60 x 1   Entered and Authorized by:   Julieanne Manson MD   Signed by:   Julieanne Manson MD on 05/14/2009   Method used:   Print then Give to Patient   RxID:   [id number]   Appended Document: 2wk f/u////rjp  Laboratory Results    Stool - Occult Blood Hemmoccult #1: negative Date: 05/15/2009 Hemoccult #2: positive Date: 05/15/2009 Hemoccult #3: negative Date: 05/15/2009   Later spoke with Mr. Junie Panning regarding hx of syphilis.  He states he was not positive for Syphilis in past when Ms. Petrosino tested positive ( [id number]) and was never treated.  He feels she did have some follow up after treatment.  Have not received records from [facility] in [place].  With regard to heme positive stool--pt. already set up with [facility].

## 2010-05-13 NOTE — Letter (Signed)
Summary: [facility]  [facility]   Imported By: Arta Bruce 11/04/2009 12:01:20  _____________________________________________________________________  External Attachment:    Type:   Image     Comment:   External Document

## 2010-05-15 NOTE — Letter (Signed)
Summary: Handout Printed  Printed Handout:  - Acid Reflux (GERD), Easy-to-Read

## 2010-05-15 NOTE — Letter (Signed)
Summary: [facility]  [facility]   Imported By: Arta Bruce 03/28/2010 12:32:38  _____________________________________________________________________  External Attachment:    Type:   Image     Comment:   External Document

## 2010-05-15 NOTE — Assessment & Plan Note (Signed)
Summary: 2wk BP check////rje  Nurse Visit   Vital Signs:  Patient profile:   55 year old female Menstrual status:  hysterectomy Pulse rate:   60 / minute Pulse rhythm:   regular Resp:     20 per minute BP sitting:   138 / 92  (right arm) Cuff size:   large  Vitals Entered By: Dutch Quint RN (April 03, 2010 9:54 AM)  Impression & Recommendations:  Problem # 1:  HYPERTENSION, ESSENTIAL (ICD-401.9) BP much better Continue meds C/o mid-sternal discomfort after lying down -- given handout on acid reflux Keep follow-up appointment  Her updated medication list for this problem includes:    Hydrochlorothiazide 25 Mg Tabs (Hydrochlorothiazide) .Marland Kitchen... 1 tab by mouth daily in morning    Toprol Xl 25 Mg Xr24h-tab (Metoprolol succinate) .Marland Kitchen... 2 tabs by mouth daily    Lisinopril 10 Mg Tabs (Lisinopril) .Marland Kitchen... 1 tab by mouth daily  Complete Medication List: 1)  Hydrochlorothiazide 25 Mg Tabs (Hydrochlorothiazide) .Marland Kitchen.. 1 tab by mouth daily in morning 2)  Toprol Xl 25 Mg Xr24h-tab (Metoprolol succinate) .... 2 tabs by mouth daily 3)  Clobetasol Propionate 0.05 % Crea (Clobetasol propionate) .... Apply two times a day to patches 4)  Lisinopril 10 Mg Tabs (Lisinopril) .Marland Kitchen.. 1 tab by mouth daily 5)  Nicotine 21 Mg/24hr Pt24 (Nicotine) .... Apply to different area of skin each day when replace for 4 weeks, then decrease to 14 mg patches for 2 weeks 6)  Nicotine 14 Mg/24hr Pt24 (Nicotine) .... Decrease to 14 mg patches after 30 days of 21 mg.  change daily to skin for 14 days 7)  Nicotine 7 Mg/24hr Pt24 (Nicotine) .... Decrease to 7 mg from 14 mg after 2 weeks.  apply to skin daily for 14 days 8)  Hydrocodone-acetaminophen 5-500 Mg Tabs (Hydrocodone-acetaminophen) .Marland Kitchen.. 1 tab by mouth two times a day for pain as needed   Patient Instructions: 1)  Reviewed with Jesse Fall 2)  Your blood pressure is much better -- 138/92 3)  Continue taking your medications as ordered 4)  You've been given a  handout on acid reflux. 5)  Keep follow-up appointment in February with Dr. Delrae Alfred. 6)  Call if anything changes or if you have any questions.   CC:  BP recheck.  History of Present Illness: 03/17/10 BP 184/108  had not been taking HCTZ along with lisinopril and Toprol.  Was to start back on HCTZ.  Restarted HCTZ on 12/3 and has been taking medications daily as ordered. States lower back pain is making it difficult to walk - wants cane for ambulation.   Review of Systems CV:  Complains of chest pain or discomfort, difficulty breathing while lying down, and fatigue; States stinging pain mid-sternal, spreading to breasts.  Occurs more at night, when she goes to bed and when she gets up out of bed.  Pain is intermittent -- states she often eats a short while before going to bed.  Denies cough, headache, visual changes, peripheral edema.   Physical Exam  General:  alert, well-developed, well-nourished, and well-hydrated.    CC: BP recheck Is Patient Diabetic? No Pain Assessment Patient in pain? yes     Location: lower back Intensity: 8 Type: aching Onset of pain  Constant  Does patient need assistance? Functional Status Self care Ambulation Normal   Allergies: No Known Drug Allergies  Orders Added: 1)  Est. Patient Level I [96295]

## 2010-06-09 ENCOUNTER — Telehealth (INDEPENDENT_AMBULATORY_CARE_PROVIDER_SITE_OTHER): Payer: Self-pay | Admitting: Internal Medicine

## 2010-06-13 ENCOUNTER — Encounter (INDEPENDENT_AMBULATORY_CARE_PROVIDER_SITE_OTHER): Payer: Self-pay | Admitting: Internal Medicine

## 2010-06-13 ENCOUNTER — Encounter: Payer: Self-pay | Admitting: Internal Medicine

## 2010-06-13 DIAGNOSIS — F329 Major depressive disorder, single episode, unspecified: Secondary | ICD-10-CM | POA: Insufficient documentation

## 2010-06-13 LAB — CONVERTED CEMR LAB
Blood in Urine, dipstick: NEGATIVE
Glucose, Urine, Semiquant: NEGATIVE
WBC Urine, dipstick: NEGATIVE
pH: 5

## 2010-06-19 NOTE — Progress Notes (Signed)
Summary: Office Visit//PHQ-9 INE SYMPTOM CHECKLIST  Office Visit//PHQ-9 INE SYMPTOM CHECKLIST   Imported By: Arta Bruce 06/13/2010 14:32:56  _____________________________________________________________________  External Attachment:    Type:   Image     Comment:   External Document

## 2010-06-19 NOTE — Progress Notes (Signed)
Summary: Needs f/u and med review  Phone Note Outgoing Call   Summary of Call: Needs follow up for hypertension--please call and schedule appt. with me or if a long wait--nurse visit. Pt. needs to be taking all of her meds--please go over them with her and make sure she is taking regularly Initial call taken by: Julieanne Manson MD,  June 09, 2010 10:13 AM  Follow-up for Phone Call        Left message on voicemail for pt. to return call.  Dutch Quint RN  June 09, 2010 11:38 AM  [phone] # temporarily out of service Gaylyn Cheers RN  June 10, 2010 4:28 PM  Has appt. scheduled 06/13/10.  Telephone # listed is currently not in service.  Dutch Quint RN  June 12, 2010 5:54 PM

## 2010-06-19 NOTE — Assessment & Plan Note (Signed)
Summary: 55 y/o CPP   Vital Signs:  Patient profile:   55 year old female Menstrual status:  hysterectomy Height:      64.5 inches Weight:      194.06 pounds Temp:     98 degrees F oral Pulse rate:   72 / minute Pulse rhythm:   regular Resp:     22 per minute BP sitting:   130 / 75  (left arm) Cuff size:   regular  Vitals Entered By: Hale Drone CMA (June 13, 2010 11:46 AM) CC: 56 y/o CPP. Concerned about back pain. Needs refill on Lisinopril. Is Patient Diabetic? No Pain Assessment Patient in pain? yes       Does patient need assistance? Functional Status Self care Ambulation Normal  Vision Screening:Left eye w/o correction: 20 / 40 Right Eye w/o correction: 20 / 40 Both eyes w/o correction:  20/ 40        Vision Entered By: Hale Drone CMA (June 13, 2010 11:47 AM)   CC:  55 y/o CPP. Concerned about back pain. Needs refill on Lisinopril.Marland Kitchen  History of Present Illness: 55 yo female here for CPP.  Concerns:  1.  Hypertension:  has not been able to get surgery for back as her bp is never in control when at [facility] office with Dr. Newell Coral.  States she is taking all of medications regularly.  Does know what she is supposed to be taking, which has not been the case previously.  Last visit, had not been taking HCTZ after a long conversation regarding her pills.  Current Medications (verified): 1)  Hydrochlorothiazide 25 Mg  Tabs (Hydrochlorothiazide) .Marland Kitchen.. 1 Tab By Mouth Daily in Morning 2)  Toprol Xl 25 Mg Xr24h-Tab (Metoprolol Succinate) .... 2 Tabs By Mouth Daily 3)  Clobetasol Propionate 0.05 % Crea (Clobetasol Propionate) .... Apply Two Times A Day To Patches 4)  Lisinopril 10 Mg Tabs (Lisinopril) .Marland Kitchen.. 1 Tab By Mouth Daily 5)  Nicotine 21 Mg/24hr Pt24 (Nicotine) .... Apply To Different Area of Skin Each Day When Replace For 4 Weeks, Then Decrease To 14 Mg Patches For 2 Weeks 6)  Nicotine 14 Mg/24hr Pt24 (Nicotine) .... Decrease To 14 Mg Patches After 30 Days of  21 Mg.  Change Daily To Skin For 14 Days 7)  Nicotine 7 Mg/24hr Pt24 (Nicotine) .... Decrease To 7 Mg From 14 Mg After 2 Weeks.  Apply To Skin Daily For 14 Days 8)  Hydrocodone-Acetaminophen 5-500 Mg Tabs (Hydrocodone-Acetaminophen) .Marland Kitchen.. 1 Tab By Mouth Two Times A Day For Pain As Needed  Allergies (verified): No Known Drug Allergies  Past History:  Past Medical History: Reviewed history from 04/26/2009 and no changes required. 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 1.  2006:  TAH with [facility] for what sound like fibroids--[facility] in [place] 2.  1980s:  ORIF of ?tibia fracture.  Family History: Mother, died 66:  stroke, Htn Father, died 27:  MI, DM, Htn 5 Brothers:  2 have died:  one with unknown type cancer, one from MI--50s, rest healthy 7 Sisters:  3 have died:  one from congenital heart disease at birth, one from MVA, one from unknown health problems.  Rest are healthy 2 Sons, ages 19 and 39:  both healthy  Social History: Divorced Lives with boyfriend 74 yo son now lives with  patient Previously worked in housekeeping. Unemployed currently. Tobacco Use:  Started age 41.  Quit smoking 03/2010 Alcohol Use:  Beer once weekly Drug Use :  never.  Review of Systems General:  Energy fine. Eyes:  Reading glasses. ENT:  Denies decreased hearing. CV:  Denies chest pain or discomfort and palpitations. Resp:  Denies shortness of breath. GI:  Denies abdominal pain, bloody stools, constipation, dark tarry stools, and diarrhea. GU:  Denies discharge, dysuria, and urinary frequency. MS:  Denies joint pain, joint redness, and joint swelling. Derm:  Denies lesion(s) and rash. Neuro:  Radicular symptoms in legs with  her Lumbar DDD. Psych:  Denies anxiety, depression, and suicidal thoughts/plans; PHQ 9 scored 13--pt. then states she actually does feel depressed on and off--no suicidal ideation, however..  Physical Exam  General:  Well-developed,well-nourished,in no acute distress; alert,appropriate and cooperative throughout examination Head:  Normocephalic and atraumatic without obvious abnormalities. No apparent alopecia or balding. Eyes:  No corneal or conjunctival inflammation noted. EOMI. Perrla. Funduscopic exam benign, without hemorrhages, exudates or papilledema. Vision grossly normal. Ears:  External ear exam shows no significant lesions or deformities.  Otoscopic examination reveals clear canals, tympanic membranes are intact bilaterally without bulging, retraction, inflammation or discharge. Hearing is grossly normal bilaterally. Nose:  External nasal examination shows no deformity or inflammation. Nasal mucosa are pink and moist without lesions or exudates. Mouth:  Oral mucosa and oropharynx without lesions or exudates.  Teeth in good repair. Neck:  No deformities, masses, or tenderness noted. Breasts:  No mass, nodules, thickening, tenderness, bulging, retraction, inflamation, nipple discharge or skin changes noted.   Lungs:  Normal respiratory effort, chest expands symmetrically. Lungs are clear to auscultation, no crackles or wheezes. Heart:  Normal rate and regular rhythm. S1 and S2 normal without gallop, murmur, click, rub or other extra sounds. Abdomen:  Bowel sounds positive,abdomen soft and non-tender without masses, organomegaly or hernias noted. Rectal:  No external abnormalities noted. Normal sphincter tone. No rectal masses or tenderness.  Heme negative light brown stool. Genitalia:  Normal external genitalia No adnexal mass or tenderness No palpable uterus No vaginal discharge. Msk:  No deformity or scoliosis noted of thoracic or lumbar spine.   Pulses:  R and L  carotid,radial,femoral,dorsalis pedis and posterior tibial pulses are full and equal bilaterally Extremities:  No clubbing, cyanosis, edema, or deformity noted with normal full range of motion of all joints.   Neurologic:  No cranial nerve deficits noted. Station and gait are normal. Plantar reflexes are down-going bilaterally. DTRs are symmetrical throughout. Sensory, motor and coordinative functions appear intact. Skin:  Intact without suspicious lesions or rashes Cervical Nodes:  No lymphadenopathy noted Axillary Nodes:  No palpable lymphadenopathy Inguinal Nodes:  No significant adenopathy Psych:  Cognition and judgment appear intact. Alert and cooperative with normal attention span and concentration. No apparent delusions, illusions, hallucinations   Impression & Recommendations:  Problem # 1:  ROUTINE GYNECOLOGICAL EXAMINATION (ICD-V72.31) Mammogram due in August Exam unremarkable Not fasting today--will return for fasting labs with next visit.  Problem # 2:  HEALTH SCREENING (ICD-V70.0) Guaiac cards x3 to return in 2 weeks. Immunizations up to dates.  Problem # 3:  HYPERTENSION, ESSENTIAL (ICD-401.9)  Increase Lisinopril Not sure if just with anxiety in Dr. Earl Gala office Her updated medication list for this problem includes:    Hydrochlorothiazide 25 Mg Tabs (Hydrochlorothiazide) .Marland Kitchen... 1 tab by mouth daily in morning    Toprol Xl 25 Mg Xr24h-tab (Metoprolol succinate) .Marland Kitchen... 2 tabs by mouth daily  Lisinopril 20 Mg Tabs (Lisinopril) .Marland Kitchen... 1 tab by mouth daily  Orders: UA Dipstick w/o Micro (automated)  (81003)  Problem # 4:  DEPRESSION (ICD-311) Referral to counseling--no meds for now Orders: Psychology Referral (Psychology)  Complete Medication List: 1)  Hydrochlorothiazide 25 Mg Tabs (Hydrochlorothiazide) .Marland Kitchen.. 1 tab by mouth daily in morning 2)  Toprol Xl 25 Mg Xr24h-tab (Metoprolol succinate) .... 2 tabs by mouth daily 3)  Clobetasol Propionate 0.05 % Crea  (Clobetasol propionate) .... Apply two times a day to patches 4)  Lisinopril 20 Mg Tabs (Lisinopril) .Marland Kitchen.. 1 tab by mouth daily 5)  Nicotine 21 Mg/24hr Pt24 (Nicotine) .... Apply to different area of skin each day when replace for 4 weeks, then decrease to 14 mg patches for 2 weeks 6)  Nicotine 14 Mg/24hr Pt24 (Nicotine) .... Decrease to 14 mg patches after 30 days of 21 mg.  change daily to skin for 14 days 7)  Nicotine 7 Mg/24hr Pt24 (Nicotine) .... Decrease to 7 mg from 14 mg after 2 weeks.  apply to skin daily for 14 days 8)  Hydrocodone-acetaminophen 5-500 Mg Tabs (Hydrocodone-acetaminophen) .Marland Kitchen.. 1 tab by mouth two times a day for pain as needed  Patient Instructions: 1)  Follow up with Dr. Delrae Alfred in 1 month --hypertension and fasting labs:  FLP, CMet, CBC. Prescriptions: CLOBETASOL PROPIONATE 0.05 % CREA (CLOBETASOL PROPIONATE) apply two times a day to patches  #30 g x 4   Entered and Authorized by:   Julieanne Manson MD   Signed by:   Julieanne Manson MD on 06/13/2010   Method used:   Electronically to        [facility] [id number] (retail)       [address]       [place], [place]  [address]       Ph: [phone]       Fax: [phone]   RxID:   [id number] TOPROL XL 25 MG XR24H-TAB (METOPROLOL SUCCINATE) 2 tabs by mouth daily  #60 x 11   Entered and Authorized by:   Julieanne Manson MD   Signed by:   Julieanne Manson MD on 06/13/2010   Method used:   Electronically to        [facility] [address] (retail)       [address]       [place], [place]  [address]       Ph: [phone]       Fax: [phone]   RxID:   [id number] HYDROCHLOROTHIAZIDE 25 MG  TABS (HYDROCHLOROTHIAZIDE) 1 tab by mouth daily in morning  #30 x 11   Entered and Authorized by:   Julieanne Manson MD   Signed by:   Julieanne Manson MD on 06/13/2010   Method used:   Electronically to        [facility] [address] (retail)        [address] [place], [place]  [address]       Ph: [phone]       Fax: [phone]   RxID:   [id number] LISINOPRIL 20 MG TABS (LISINOPRIL) 1 tab by mouth daily  #30 x 11   Entered and Authorized by:   Julieanne Manson MD   Signed by:   Julieanne Manson MD on 06/13/2010  Method used:   Electronically to        [facility] [address] [address] (retail)       [address] [place], [place]  [address]       Ph: [phone]       Fax: [phone]   RxID:   [id number]    Orders Added: 1)  Est. Patient age 32-64 [81] 2)  Psychology Referral [Psychology] 3)  UA Dipstick w/o Micro (automated)  [81003]   Immunization History:  Tetanus/Td Immunization History:    Tetanus/Td:  Tdap (11/17/2007)  Influenza Immunization History:    Influenza:  Fluvax Non-MCR (01/21/2010)    Preventive Care Screening  Prior Values:    Mammogram:  ASSESSMENT: Negative - BI-RADS 1^MM DIGITAL SCREENING (11/28/2009)    Colonoscopy:  DONE (07/04/2009)    Last Tetanus Booster:  Tdap (11/17/2007)     SBE:  Yes--no changes Guaiac cards:  did perform, but not in flow sheet. Osteoprevention:  2 servings of dairy daily.  Limited exercise with back pain   Laboratory Results   Urine Tests  Date/Time Received: June 13, 2010 12:08 PM   Routine Urinalysis   Color: lt. yellow Glucose: negative   (Normal Range: Negative) Bilirubin: negative   (Normal Range: Negative) Ketone: negative   (Normal Range: Negative) Spec. Gravity: >=1.030   (Normal Range: 1.003-1.035) Blood: negative   (Normal Range: Negative) pH: 5.0   (Normal Range: 5.0-8.0) Protein: negative   (Normal Range: Negative) Urobilinogen: 0.2   (Normal Range: 0-1) Nitrite: negative   (Normal Range: Negative) Leukocyte Esterace: negative   (Normal Range: Negative)

## 2010-06-24 NOTE — Letter (Signed)
Summary: [name]   Imported By: Arta Bruce 06/16/2010 08:51:18  _____________________________________________________________________  External Attachment:    Type:   Image     Comment:   External Document

## 2010-06-26 LAB — BASIC METABOLIC PANEL
BUN: 11 mg/dL (ref 6–23)
CO2: 27 mEq/L (ref 19–32)
Calcium: 9.9 mg/dL (ref 8.4–10.5)
Chloride: 109 mEq/L (ref 96–112)
Creatinine, Ser: 0.87 mg/dL (ref 0.4–1.2)
GFR calc Af Amer: >60 mL/min (ref >60)
GFR calc non Af Amer: >60 mL/min (ref >60)
Glucose, Bld: 91 mg/dL (ref 70–99)
Potassium: 4.4 mEq/L (ref 3.5–5.1)
Sodium: 143 mEq/L (ref 135–145)

## 2010-06-26 LAB — CBC
HCT: 42.5 % (ref 36.0–46.0)
Hemoglobin: 13.6 g/dL (ref 12.0–15.0)
MCH: 24 pg — ABNORMAL LOW (ref 26.0–34.0)
MCHC: 32 g/dL (ref 30.0–36.0)
MCV: 75.1 fL — ABNORMAL LOW (ref 78.0–100.0)
Platelets: 290 10*3/uL (ref 150–400)
RBC: 5.66 MIL/uL — ABNORMAL HIGH (ref 3.87–5.11)
RDW: 15 % (ref 11.5–15.5)
WBC: 5.8 10*3/uL (ref 4.0–10.5)

## 2010-06-26 LAB — ABO/RH: ABO/RH(D): B POS

## 2010-06-26 LAB — TYPE AND SCREEN
ABO/RH(D): B POS
Antibody Screen: NEGATIVE

## 2010-06-26 LAB — SURGICAL PCR SCREEN
MRSA, PCR: NEGATIVE
Staphylococcus aureus: NEGATIVE

## 2010-07-24 LAB — POCT I-STAT, CHEM 8
Chloride: 111 mEq/L (ref 96–112)
Creatinine, Ser: 1.1 mg/dL (ref 0.4–1.2)
Glucose, Bld: 85 mg/dL (ref 70–99)
HCT: 42 % (ref 36.0–46.0)
Potassium: 4.7 mEq/L (ref 3.5–5.1)

## 2010-08-12 ENCOUNTER — Other Ambulatory Visit (HOSPITAL_COMMUNITY): Payer: Self-pay | Admitting: Neurosurgery

## 2010-08-12 DIAGNOSIS — M4310 Spondylolisthesis, site unspecified: Secondary | ICD-10-CM

## 2010-08-14 ENCOUNTER — Ambulatory Visit (HOSPITAL_COMMUNITY)
Admission: RE | Admit: 2010-08-14 | Discharge: 2010-08-14 | Disposition: A | Discharge status: Home / Self Care | Payer: Medicaid Other | Source: Ambulatory Visit | Attending: Neurosurgery | Admitting: Neurosurgery

## 2010-08-14 DIAGNOSIS — M47817 Spondylosis without myelopathy or radiculopathy, lumbosacral region: Secondary | ICD-10-CM | POA: Insufficient documentation

## 2010-08-14 DIAGNOSIS — M545 Low back pain, unspecified: Secondary | ICD-10-CM | POA: Insufficient documentation

## 2010-08-14 DIAGNOSIS — M48061 Spinal stenosis, lumbar region without neurogenic claudication: Secondary | ICD-10-CM | POA: Insufficient documentation

## 2010-08-14 DIAGNOSIS — M519 Unspecified thoracic, thoracolumbar and lumbosacral intervertebral disc disorder: Secondary | ICD-10-CM | POA: Insufficient documentation

## 2010-08-14 DIAGNOSIS — M4310 Spondylolisthesis, site unspecified: Secondary | ICD-10-CM

## 2010-08-14 DIAGNOSIS — M5126 Other intervertebral disc displacement, lumbar region: Secondary | ICD-10-CM | POA: Insufficient documentation

## 2010-09-01 ENCOUNTER — Inpatient Hospital Stay (HOSPITAL_COMMUNITY): Payer: Medicaid Other

## 2010-09-01 ENCOUNTER — Inpatient Hospital Stay (HOSPITAL_COMMUNITY)
Admission: RE | Admit: 2010-09-01 | Discharge: 2010-09-05 | DRG: 460 | Disposition: A | Discharge status: Home / Self Care | Payer: Medicaid Other | Source: Ambulatory Visit | Attending: Neurosurgery | Admitting: Neurosurgery

## 2010-09-01 DIAGNOSIS — I10 Essential (primary) hypertension: Secondary | ICD-10-CM | POA: Diagnosis present

## 2010-09-01 DIAGNOSIS — M4310 Spondylolisthesis, site unspecified: Secondary | ICD-10-CM | POA: Diagnosis present

## 2010-09-01 DIAGNOSIS — M47817 Spondylosis without myelopathy or radiculopathy, lumbosacral region: Principal | ICD-10-CM | POA: Diagnosis present

## 2010-09-01 DIAGNOSIS — F17200 Nicotine dependence, unspecified, uncomplicated: Secondary | ICD-10-CM | POA: Diagnosis present

## 2010-09-01 HISTORY — PX: BACK SURGERY: SHX140

## 2010-09-01 LAB — TYPE AND SCREEN
ABO/RH(D): B POS
Antibody Screen: NEGATIVE

## 2010-09-01 LAB — CBC
HCT: 41.3 % (ref 36.0–46.0)
Hemoglobin: 13.3 g/dL (ref 12.0–15.0)
MCH: 23.8 pg — ABNORMAL LOW (ref 26.0–34.0)
MCHC: 32.2 g/dL (ref 30.0–36.0)
MCV: 73.9 fL — ABNORMAL LOW (ref 78.0–100.0)
Platelets: 268 10*3/uL (ref 150–400)
RBC: 5.59 MIL/uL — ABNORMAL HIGH (ref 3.87–5.11)
RDW: 15.4 % (ref 11.5–15.5)
WBC: 5.2 10*3/uL (ref 4.0–10.5)

## 2010-09-01 LAB — COMPREHENSIVE METABOLIC PANEL
ALT: 33 U/L (ref 0–35)
AST: 21 U/L (ref 0–37)
Albumin: 3.7 g/dL (ref 3.5–5.2)
Alkaline Phosphatase: 71 U/L (ref 39–117)
BUN: 21 mg/dL (ref 6–23)
CO2: 23 mEq/L (ref 19–32)
Calcium: 10.2 mg/dL (ref 8.4–10.5)
Chloride: 107 mEq/L (ref 96–112)
Creatinine, Ser: 1 mg/dL (ref 0.4–1.2)
GFR calc Af Amer: >60 mL/min (ref >60)
GFR calc non Af Amer: 58 mL/min — ABNORMAL LOW (ref >60)
Glucose, Bld: 99 mg/dL (ref 70–99)
Potassium: 4.5 mEq/L (ref 3.5–5.1)
Sodium: 142 mEq/L (ref 135–145)
Total Bilirubin: 0.4 mg/dL (ref 0.3–1.2)
Total Protein: 7.9 g/dL (ref 6.0–8.3)

## 2010-09-01 LAB — SURGICAL PCR SCREEN
MRSA, PCR: NEGATIVE
Staphylococcus aureus: NEGATIVE

## 2010-09-03 NOTE — H&P (Signed)
Shannon [name], Shannon [name] NO.:  [id number]  MEDICAL RECORD NO.:  [id number]           PATIENT TYPE:  I  LOCATION:  3014                         FACILITY:  [facility]  PHYSICIAN:  Hewitt Shorts, M.D.DATE OF BIRTH:  [birth year]  DATE OF ADMISSION:  09/01/2010 DATE OF DISCHARGE:                             HISTORY & PHYSICAL   HISTORY OF PRESENT ILLNESS:  The patient is a 55 year old right-handed black female who is evaluated for low back pain extending down to the buttocks and thighs bilaterally.  This has been gradually worsening over the past 6 years, activities right at home tend to aggravate it.  She is treated with variety of NSAIDS, analgesics and she had originally been scheduled for surgery last year.  However, she was found to have significant hypertension and therefore her antihypertensive regimen has been adjusted by her primary physician.  Her blood pressure is now well controlled and she is now brought to surgery.  PAST MEDICAL HISTORY:  Notable for history of hypertension.  She does not describe any history of myocardial function, cancer, stroke, diabetes, peptic ulcer disease or lung disease.  PREVIOUS SURGERY:  Hysterectomy in 2005 and surgery in her left leg for a fracture in 2000.  ALLERGIES:  She denies allergies to medications.  CURRENT MEDICATIONS: 1. Toprol-XL 25 mg daily. 2. Lisinopril 20 mg daily. 3. Hydrochlorothiazide 25 mg daily. 4. Nabumetone 500 mg b.i.d.  FAMILY HISTORY:  Parents passed on.  SOCIAL HISTORY:  The patient is separated from her husband.  She quit smoking couple of months ago.  She does not recall beverages.  She denies history of substance abuse.  REVIEW OF SYSTEMS:  Notable for those described in history of present illness, past medical history, but a 14-point review of system is otherwise unremarkable.  PHYSICAL EXAMINATION:  GENERAL:  The patient is a well-developed, well- nourished black female in no acute  distress. VITAL SIGNS:  Her temperature is 97.8, pulse 78, blood pressure 108/67, respiratory rate 18, height 5 feet 5 inches, weight 182 pounds. LUNGS:  Clear to auscultation.  She has symmetric respiratory excursion. HEART:  Regular rate and rhythm.  S1 and S2.  There is no murmur. ABDOMEN:  Soft, nontender, and nondistended.  Bowel sounds are present. EXTREMITIES:  No clubbing, cyanosis, edema. MUSCULOSKELETAL:  Mild diffuse discomfort to palpation without any specific point tenderness.  She is able to flex 90 degrees, extend well. Straight-leg raising is negative bilaterally. NEUROLOGIC:  5/5 strength in the lower extremities in the iliopsoas, quadriceps, dorsiflexors, extensor hallucis longus, and plantar flexor bilaterally.  Sensation is intact to pinprick through the lower extremities.  Reflexes are minimal at the quadriceps, absent in gastrocnemius are symmetrical bilaterally.  Toes are downgoing bilaterally.  She has normal gait and stance.  DIAGNOSTICS:  X-rays show grade 1 to 2 dynamic degenerative spondylosis at L4-5 secondary to facet arthropathy.  MRI scan shows advanced bilateral L4-5 facet arthropathy with resulting anterolisthesis at L4-5 and canal stenosis at the L4-5 level.  IMPRESSION:  The patient with low back and bilateral lower extremity pain consistent with neurogenic claudication with grade 1 to 2 dynamic degenerative spondylosis  at L4 and 5 with resulting multifactorial lumbar stenosis at the L4-5 level.  PLAN:  The patient is admitted for lumbar decompression and arthrodesis. We will plan on a L4-5 lumbar laminotomy, facetectomy, foraminotomy, bilateral posterior lumbar interbody arthrodesis and bilateral posterolateral arthrodesis with bone graft, posterior instrumentation and interbody implants.  At this time I have discussed numerous occasions with the patient, the nature of the surgical procedure, nature of her condition, typical length of surgery,  hospital stay, and overall recuperation limitations postoperatively, need for postoperative immobilization, lumbar brace, and risks including risks of infection, bleeding, possible need for transfusion, the risk of nerve dysfunction, pain, weakness, numbness, paresthesias, the risk of dural tear, CSF leak, possible need for further surgery, risks of failure of the arthrodesis, and possible need for further surgery, and anesthetic risks, myocardial function, stroke, pneumonia, and death.  Understanding all these, she would like to go ahead with surgery and is admitted for such.     Hewitt Shorts, M.D.     RWN/MEDQ  D:  09/01/2010  T:  09/01/2010  Job:  [id number]  Electronically Signed by Shirlean Kelly M.D. on 09/03/2010 05:30:53 PM

## 2010-09-03 NOTE — Op Note (Signed)
Shannon [name], Shannon [name]            ACCOUNT NO.:  [id number]  MEDICAL RECORD NO.:  [id number]           PATIENT TYPE:  I  LOCATION:  3014                         FACILITY:  [facility]  PHYSICIAN:  Hewitt Shorts, M.D.DATE OF BIRTH:  05-05-1955  DATE OF PROCEDURE:  09/01/2010 DATE OF DISCHARGE:                              OPERATIVE REPORT   PREOPERATIVE DIAGNOSES:  L4-5 spondylolisthesis, L4-5 spinal stenosis, lumbar spondylosis with advanced facet arthropathy, lumbar radiculopathy, and lumbago.  POSTOPERATIVE DIAGNOSES:  L4-5 spondylolisthesis, L4-5 spinal stenosis, lumbar spondylosis with advanced facet arthropathy, lumbar radiculopathy, and lumbago.  PROCEDURE:  L4-5 lumbar decompression including laminotomy, facetectomy, foraminotomy with decompression of the exiting L4 and L5 nerve roots, with decompression beyond that required for interbody arthrodesis with microdissection microsurgical technique, L4-5 post lumbar interbody arthrodesis with AVS PEEK interbody implants, and Vitoss bone marrow aspirate and infuse, and a bilateral L4-5 posterolateral arthrodesis with radius posterior instrumentation and Vitoss bone marrow aspirate and infuse.  SURGEON:  Hewitt Shorts, MD.  ASSISTANT:  Coletta Memos, MD.  ANESTHESIA:  General endotracheal.  INDICATIONS:  The patient is a 55 year old woman, who has had difficulties for several years with back and bilateral lower extremity pain.  She was found to have a dynamic degenerative grade 1-2 spondylolisthesis at L4-5 with associated spinal stenosis.  There was advanced bilateral L4-5 facet arthropathy.  The decision was made to proceed with decompression and stabilization.  DESCRIPTION OF PROCEDURE:  The patient was brought to the operating room, placed on general endotracheal anesthesia.  The patient was turned to prone position.  Lumbar region was prepped with Betadine soap solution and draped in the sterile fashion.  The  midline incision was infiltrated with local anesthetic with epinephrine.  An x-ray was taken. The L4-5 level identified.  The midline incision was made over the L4-5 level and carried down through the subcutaneous tissue.  Bipolar cautery and electrocautery were used to maintain hemostasis.  Dissection was carried down to the lumbar fascia, which incised bilaterally and the paraspinal muscles were dissected from the spinous process and lamina in a subperiosteal fashion.  Localizing x-ray was taken and the L4-5 interlaminar space was identified.  The fascia was then carried out laterally over the hypertrophic facet joints and then we proceeded with bilateral laminotomy and facetectomy using the high-speed drill and Kerrison punches. As the facetectomy was performed, we were unable to expose the transverse process of L5 and then subsequently L4 bilaterally.  These were later decorticated for the posterolateral arthrodesis.  As the laminotomy and facetectomy was performed, the thickened ligamentum flavum was carefully removed.  We identified the thecal sac and exiting L4 and L5 nerve roots.  The thickened ligamentum flavum overlying the neural structures was removed, decompressing them as well. The decompression was done using microdissection microsurgical technique.  We then proceeded with bilateral diskectomy incising the annulus bilaterally and proceeded with diskectomy using variety of micro curettes and pituitary rongeurs.  A thorough diskectomy was performed and then we prepared the endplates using paddle curettes to remove the cartilaginous endplate surface down to a good bony surface.  The C-arm fluoroscope was then draped and  brought into the field and we proceeded with placement of pedicle screws bilaterally.  At each site, the pedicle entry point was identified.  Each pedicle was probed, examined with a ball probe, and good bony surface were found.  Each pedicle was tapped  with a 5.25-mm tap and then we placed 5.75 x 40 mm screws bilaterally at L4-5 and 5.75 x 35 mm screws bilaterally at L5. We did aspirate bone marrow aspirate from the vertebral bodies which was injected over 10 mL strip of Vitoss.  Once all four screws were placed, we packed two 12 x 25 x 4 degree PEEK implants with a combination of Vitoss bone marrow aspirate and infuse, and then we placed the first implant on the right side, retracted the thecal sac and nerve root medially.  We then went to the left side, packed the midline with combination of infuse and Vitoss bone marrow aspirate.  Once the midline was fully packed with bone graft material, we gently tamped the second implant on the left side, again retracted the thecal sac and nerve roots.  This was all done with C-arm fluoroscopic guidance and we confirmed good positioning of the interbody implants both by C-arm as well as with direct visualization.  We then selected two 35-mm prelordosed rods were placed within the screw heads and secured with locking caps.  Once all four locking caps were placed, final tightening was done of all four locking caps with a counter torque.  We then packed the lateral gutter with a combination of infuse and Vitoss bone marrow aspirate, and once the posterolateral arthrodesis completed, we proceeded with closing.  The wound had been irrigated numerous times throughout the procedure with saline solution, bacitracin, and good hemostasis established, confirmed, and we proceed with closure.  Deep fascia was closed with interrupted undyed one Vicryl sutures.  The Scarpa fascia was closed with interrupted undyed one Vicryl sutures.  The subcutaneous and subcuticular closed with interrupted inverted 2-0 undyed Vicryl sutures.  Skin was approximated with Dermabond.  The wound was dressed with sterile gauze and 4-inch Hypafix.  The patient tolerated the procedure well.  Estimated blood loss was 200 mL.   Although, we did use a Cell Saver.  The Cell Saver technician did not have feel there was sufficient blood loss to process the collected blood.  Sponge and needle count were correct.  Following the surgery, the patient turned back to supine position, reversed from the anesthetic, extubated, and transferred to recovery room for further care.     Hewitt Shorts, M.D.     RWN/MEDQ  D:  09/01/2010  T:  09/02/2010  Job:  [id number]  Electronically Signed by Shirlean Kelly M.D. on 09/03/2010 05:31:06 PM

## 2010-09-17 NOTE — Discharge Summary (Signed)
  NAMECECILE, Shannon [name] NO.:  [id number]  MEDICAL RECORD NO.:  [id number]           PATIENT TYPE:  I  LOCATION:  3014                         FACILITY:  [facility]  PHYSICIAN:  Hewitt Shorts, M.D.DATE OF BIRTH:  1955-05-16  DATE OF ADMISSION:  09/01/2010 DATE OF DISCHARGE:  09/05/2010                              DISCHARGE SUMMARY   ADMISSION HISTORY AND PHYSICAL EXAMINATION:  The patient is a 55 year old right-handed black female who has been having difficulties with low back and bilateral buttock and thigh pain.  It has been worsening over the past 6 years.  She has been treated with NSAIDs and analgesics without improvement and is admitted now for definitive intervention.  X- rays show a grade 1-2 dynamic degenerative spondylosis of L4-L5, secondary facet arthropathy.  MRI scan shows advanced bilateral L4-L5 facet arthropathy with resulting anterolisthesis at L4-L5 and canal stenosis at the L4-L5 level.  PAST MEDICAL HISTORY:  Notable for significant hypertension that took significant amount of adjustments by her primary physician but has been under control.  She has had a hysterectomy.  MEDICATION: 1. Toprol-XL 25 mg daily. 2. Lisinopril 20 mg daily. 3. Hydrochlorothiazide 25 mg daily. 4. Nabumetone 500 mg b.i.d.  PHYSICAL EXAMINATION:  GENERAL:  Unremarkable. NEUROLOGIC:  Intact strength and sensation.  HOSPITAL COURSE:  The patient was admitted and underwent bilateral L4-L5 lumbar decompression, posterior lumbar interbody fusion, and posterolateral arthrodesis with interbody implants post instrumentation and bone graft.  Following surgery, she has done well.  She has made steady progress.  She was seen by physical therapy and occupational therapy and is up and ambulating with a rolling walker in the hallways. She is voiding well.  She is eating well.  Her incision is healing nicely.  She is afebrile and she is being discharged to home to  return for a followup with me in about 3 weeks with x-rays at my office.  She has been given prescription for Percocet 1-2 tablets p.o. q.4-6 h. p.r.n. pain, 60 tablets, no refills; Flexeril 10 mg t.i.d. p.r.n. muscle spasms, 60 tablets, 1 refill.  She is to continue her usual home medications.  DISCHARGE DIAGNOSES: 1. Lumbar spondylolisthesis. 2. Lumbar stenosis. 3. Lumbar spondylosis.     Hewitt Shorts, M.D.     RWN/MEDQ  D:  09/05/2010  T:  09/05/2010  Job:  [id number]  Electronically Signed by Shirlean Kelly M.D. on 09/17/2010 09:48:02 AM

## 2010-11-12 ENCOUNTER — Other Ambulatory Visit (HOSPITAL_COMMUNITY): Payer: Self-pay | Admitting: Family Medicine

## 2010-11-12 DIAGNOSIS — Z1231 Encounter for screening mammogram for malignant neoplasm of breast: Secondary | ICD-10-CM

## 2010-12-02 ENCOUNTER — Ambulatory Visit (HOSPITAL_COMMUNITY)
Admission: RE | Admit: 2010-12-02 | Discharge: 2010-12-02 | Disposition: A | Discharge status: Home / Self Care | Payer: Medicaid Other | Source: Ambulatory Visit | Attending: Family Medicine | Admitting: Family Medicine

## 2010-12-02 DIAGNOSIS — Z1231 Encounter for screening mammogram for malignant neoplasm of breast: Secondary | ICD-10-CM | POA: Insufficient documentation

## 2012-03-21 ENCOUNTER — Other Ambulatory Visit: Payer: Self-pay | Admitting: Family Medicine

## 2012-03-21 ENCOUNTER — Ambulatory Visit
Admission: RE | Admit: 2012-03-21 | Discharge: 2012-03-21 | Disposition: A | Discharge status: Home / Self Care | Payer: Medicaid Other | Source: Ambulatory Visit | Attending: Family Medicine | Admitting: Family Medicine

## 2012-03-21 DIAGNOSIS — I10 Essential (primary) hypertension: Secondary | ICD-10-CM

## 2012-04-11 ENCOUNTER — Other Ambulatory Visit (HOSPITAL_COMMUNITY): Payer: Self-pay | Admitting: Family Medicine

## 2012-04-11 DIAGNOSIS — Z1231 Encounter for screening mammogram for malignant neoplasm of breast: Secondary | ICD-10-CM

## 2012-04-27 ENCOUNTER — Ambulatory Visit (HOSPITAL_COMMUNITY)
Admission: RE | Admit: 2012-04-27 | Discharge: 2012-04-27 | Disposition: A | Discharge status: Home / Self Care | Payer: Medicaid Other | Source: Ambulatory Visit | Attending: Family Medicine | Admitting: Family Medicine

## 2012-04-27 DIAGNOSIS — Z1231 Encounter for screening mammogram for malignant neoplasm of breast: Secondary | ICD-10-CM

## 2012-08-19 ENCOUNTER — Other Ambulatory Visit: Payer: Self-pay | Admitting: Family Medicine

## 2012-08-19 ENCOUNTER — Ambulatory Visit
Admission: RE | Admit: 2012-08-19 | Discharge: 2012-08-19 | Disposition: A | Discharge status: Home / Self Care | Payer: Medicaid Other | Source: Ambulatory Visit | Attending: Family Medicine | Admitting: Family Medicine

## 2012-08-19 DIAGNOSIS — M25571 Pain in right ankle and joints of right foot: Secondary | ICD-10-CM

## 2013-03-22 DIAGNOSIS — I10 Essential (primary) hypertension: Secondary | ICD-10-CM | POA: Diagnosis not present

## 2013-03-26 DIAGNOSIS — Z23 Encounter for immunization: Secondary | ICD-10-CM | POA: Diagnosis not present

## 2013-03-28 DIAGNOSIS — I10 Essential (primary) hypertension: Secondary | ICD-10-CM | POA: Diagnosis not present

## 2013-03-29 ENCOUNTER — Other Ambulatory Visit: Payer: Self-pay

## 2013-03-29 DIAGNOSIS — Z1231 Encounter for screening mammogram for malignant neoplasm of breast: Secondary | ICD-10-CM

## 2013-05-03 ENCOUNTER — Ambulatory Visit
Admission: RE | Admit: 2013-05-03 | Discharge: 2013-05-03 | Disposition: A | Discharge status: Home / Self Care | Payer: Medicare Other | Source: Ambulatory Visit

## 2013-05-03 DIAGNOSIS — Z1231 Encounter for screening mammogram for malignant neoplasm of breast: Secondary | ICD-10-CM

## 2013-05-08 ENCOUNTER — Encounter (HOSPITAL_COMMUNITY): Payer: Self-pay | Admitting: Emergency Medicine

## 2013-05-08 DIAGNOSIS — R1013 Epigastric pain: Secondary | ICD-10-CM | POA: Insufficient documentation

## 2013-05-08 NOTE — ED Notes (Addendum)
Per EMS, pt reports epigastric pain for the past few days denies n/v/d, reports burning pain, family member at bedside noted to be encouraging pt to c/o of associated symptoms. Pt a&o x4 NAD noted at this time. Also reports drinking 2-3 40 oz beers in the past 24 hours "but I can't remember exactly." EMS reports that pt appears to have a hernia upon palpation.

## 2013-05-09 ENCOUNTER — Emergency Department (HOSPITAL_COMMUNITY)
Admission: EM | Admit: 2013-05-09 | Discharge: 2013-05-09 | Discharge status: Against Medical Advice | Payer: Medicare Other | Attending: Emergency Medicine | Admitting: Emergency Medicine

## 2013-05-09 NOTE — ED Notes (Signed)
Called pt again without response

## 2013-05-09 NOTE — ED Notes (Signed)
Called for lab x2 without response

## 2013-05-10 ENCOUNTER — Emergency Department (HOSPITAL_COMMUNITY)
Admission: EM | Admit: 2013-05-10 | Discharge: 2013-05-10 | Disposition: A | Discharge status: Home / Self Care | Payer: Medicare Other | Attending: Emergency Medicine | Admitting: Emergency Medicine

## 2013-05-10 ENCOUNTER — Encounter (HOSPITAL_COMMUNITY): Payer: Self-pay | Admitting: Emergency Medicine

## 2013-05-10 ENCOUNTER — Emergency Department (HOSPITAL_COMMUNITY): Payer: Medicare Other

## 2013-05-10 DIAGNOSIS — R143 Flatulence: Secondary | ICD-10-CM | POA: Diagnosis not present

## 2013-05-10 DIAGNOSIS — I10 Essential (primary) hypertension: Secondary | ICD-10-CM | POA: Diagnosis not present

## 2013-05-10 DIAGNOSIS — R141 Gas pain: Secondary | ICD-10-CM | POA: Insufficient documentation

## 2013-05-10 DIAGNOSIS — R109 Unspecified abdominal pain: Secondary | ICD-10-CM | POA: Diagnosis not present

## 2013-05-10 DIAGNOSIS — K439 Ventral hernia without obstruction or gangrene: Secondary | ICD-10-CM | POA: Insufficient documentation

## 2013-05-10 DIAGNOSIS — R142 Eructation: Secondary | ICD-10-CM | POA: Insufficient documentation

## 2013-05-10 HISTORY — DX: Essential (primary) hypertension: I10

## 2013-05-10 LAB — COMPREHENSIVE METABOLIC PANEL
ALBUMIN: 3.5 g/dL (ref 3.5–5.2)
ALT: 28 U/L (ref 0–35)
AST: 25 U/L (ref 0–37)
Alkaline Phosphatase: 67 U/L (ref 39–117)
BILIRUBIN TOTAL: 0.4 mg/dL (ref 0.3–1.2)
BUN: 22 mg/dL (ref 6–23)
CO2: 24 mEq/L (ref 19–32)
CREATININE: 1.2 mg/dL — AB (ref 0.50–1.10)
Calcium: 9.7 mg/dL (ref 8.4–10.5)
Chloride: 105 mEq/L (ref 96–112)
GFR calc non Af Amer: 50 mL/min — ABNORMAL LOW (ref >90)
GFR, EST AFRICAN AMERICAN: 58 mL/min — AB (ref >90)
GLUCOSE: 103 mg/dL — AB (ref 70–99)
Potassium: 4.6 mEq/L (ref 3.7–5.3)
Sodium: 142 mEq/L (ref 137–147)
TOTAL PROTEIN: 8.1 g/dL (ref 6.0–8.3)

## 2013-05-10 LAB — LIPASE, BLOOD: LIPASE: 57 U/L (ref 11–59)

## 2013-05-10 LAB — CBC WITH DIFFERENTIAL/PLATELET
BASOS PCT: 1 % (ref 0–1)
Basophils Absolute: 0.1 10*3/uL (ref 0.0–0.1)
EOS ABS: 0.2 10*3/uL (ref 0.0–0.7)
Eosinophils Relative: 3 % (ref 0–5)
HEMATOCRIT: 39.9 % (ref 36.0–46.0)
HEMOGLOBIN: 13 g/dL (ref 12.0–15.0)
Lymphocytes Relative: 36 % (ref 12–46)
Lymphs Abs: 2 10*3/uL (ref 0.7–4.0)
MCH: 24.3 pg — AB (ref 26.0–34.0)
MCHC: 32.6 g/dL (ref 30.0–36.0)
MCV: 74.7 fL — AB (ref 78.0–100.0)
MONO ABS: 0.4 10*3/uL (ref 0.1–1.0)
Monocytes Relative: 8 % (ref 3–12)
NEUTROS ABS: 2.9 10*3/uL (ref 1.7–7.7)
Neutrophils Relative %: 52 % (ref 43–77)
Platelets: 319 10*3/uL (ref 150–400)
RBC: 5.34 MIL/uL — ABNORMAL HIGH (ref 3.87–5.11)
RDW: 14.7 % (ref 11.5–15.5)
WBC: 5.6 10*3/uL (ref 4.0–10.5)

## 2013-05-10 MED ORDER — TRAMADOL HCL 50 MG PO TABS: 50.0000 mg | ORAL_TABLET | Freq: Four times a day (QID) | ORAL | Status: DC | PRN

## 2013-05-10 NOTE — ED Notes (Signed)
Fist size lump in abd x 6 months states burns at times  No n/v

## 2013-05-10 NOTE — ED Provider Notes (Signed)
CSN: [id number]     Arrival date & time 05/10/13  1044 History   First MD Initiated Contact with Patient 05/10/13 1108     Chief Complaint  Patient presents with  . Abdominal Pain   (Consider location/radiation/quality/duration/timing/severity/associated sxs/prior Treatment) HPI Shannon [name] is a 58 y.o. female who presents to emergency department complaining of abdominal pain. Patient states she has had a mass on her upper abdomen for approximately 6 months. States has not had pain until about 3-4 days ago. Pt denies any associated symptoms. No nausea/vomiting, diarrhea, no constipation. Last bowel movement today and normal. No fever. She did not try anything for her pain. Straining makes pain worse. Nothing makes it better. States pain comes and goes. No hx of abdominal problems or surgeries.    Past Medical History  Diagnosis Date  . Hypertension    History reviewed. No pertinent past surgical history. No family history on file. History  Substance Use Topics  . Smoking status: Never Smoker   . Smokeless tobacco: Not on file  . Alcohol Use: Yes   OB History   Grav Para Term Preterm Abortions TAB SAB Ect Mult Living                 Review of Systems  Constitutional: Negative for fever and chills.  Respiratory: Negative for cough, chest tightness and shortness of breath.   Cardiovascular: Negative for chest pain, palpitations and leg swelling.  Gastrointestinal: Positive for abdominal pain and abdominal distention. Negative for nausea, vomiting, diarrhea, constipation and rectal pain.  Genitourinary: Negative for dysuria, flank pain and pelvic pain.  Musculoskeletal: Negative for arthralgias, myalgias, neck pain and neck stiffness.  Neurological: Negative for dizziness, weakness and headaches.  All other systems reviewed and are negative.    Allergies  Review of patient's allergies indicates no known allergies.  Home Medications  No current outpatient prescriptions on  file. BP 131/92  Pulse 64  Temp(Src) 98.6 F (37 C)  Resp 16  SpO2 98% Physical Exam  Nursing note and vitals reviewed. Constitutional: She is oriented to person, place, and time. She appears well-developed and well-nourished. No distress.  HENT:  Head: Normocephalic.  Eyes: Conjunctivae are normal.  Neck: Neck supple.  Cardiovascular: Normal rate, regular rhythm and normal heart sounds.   Pulmonary/Chest: Effort normal and breath sounds normal. No respiratory distress. She has no wheezes. She has no rales.  Abdominal: Soft. Bowel sounds are normal. She exhibits no distension. There is tenderness. There is no rebound and no guarding.  Pt is obese, exam difficult given her body habitus. There appears to be an orange size mass to the midline of the abdomen, it is soft. Mild tenderness to palpation.   Musculoskeletal: She exhibits no edema.  Neurological: She is alert and oriented to person, place, and time.  Skin: Skin is warm and dry.  Psychiatric: She has a normal mood and affect. Her behavior is normal.    ED Course  Procedures (including critical care time) Labs Review Labs Reviewed  CBC WITH DIFFERENTIAL - Abnormal; Notable for the following:    RBC 5.34 (*)    MCV 74.7 (*)    MCH 24.3 (*)    All other components within normal limits  COMPREHENSIVE METABOLIC PANEL - Abnormal; Notable for the following:    Glucose, Bld 103 (*)    Creatinine, Ser 1.20 (*)    GFR calc non Af Amer 50 (*)    GFR calc Af Amer 58 (*)  All other components within normal limits  LIPASE, BLOOD   Imaging Review Dg Abd Acute W/chest  05/10/2013   CLINICAL DATA:  Abdominal pain.  EXAM: ACUTE ABDOMEN SERIES (ABDOMEN 2 VIEW & CHEST 1 VIEW)  COMPARISON:  Chest x-ray dated 03/21/2012 and abdomen radiograph dated 09/20/2012  FINDINGS: Heart size and pulmonary vascularity are normal and the lungs are clear. Is no free air or free fluid in the abdomen. There is a small amount of air scattered throughout  normal appearing loops of bowel. No acute osseous abnormality. Prior lumbar fusion at L4-5.  IMPRESSION: Benign appearing abdomen and chest.   Electronically Signed   By: Rozetta Nunnery M.D.   On: 05/10/2013 11:52    EKG Interpretation   None       MDM   1. Ventral hernia     Pt with ventral hernia, which is soft, reducible. She is in no pain at present. No n/v, normal bowel movements. Acute abdomen negative. Labs unremarkable. Pt will be d/c home with surgery follow up. No surgical abdomen on exam.   Filed Vitals:   05/10/13 1056 05/10/13 1125 05/10/13 1310  BP: 131/92 141/109 111/66  Pulse: 64 55 58  Temp: 98.6 F (37 C)  98.4 F (36.9 C)  TempSrc:   Oral  Resp: 16 16 16   SpO2: 98% 98% 98%      Renold Genta, PA-C 05/10/13 1912

## 2013-05-10 NOTE — Discharge Instructions (Signed)
Avoid strenuous activity or heavy lifting. Take ibuprofen or tylenol for pain. Take ultram for severe pain. Follow up with general surgery or Primary care doctor.     Hernia A hernia occurs when an internal organ pushes out through a weak spot in the abdominal wall. Hernias most commonly occur in the groin and around the navel. Hernias often can be pushed back into place (reduced). Most hernias tend to get worse over time. Some abdominal hernias can get stuck in the opening (irreducible or incarcerated hernia) and cannot be reduced. An irreducible abdominal hernia which is tightly squeezed into the opening is at risk for impaired blood supply (strangulated hernia). A strangulated hernia is a medical emergency. Because of the risk for an irreducible or strangulated hernia, surgery may be recommended to repair a hernia. CAUSES   Heavy lifting.  Prolonged coughing.  Straining to have a bowel movement.  A cut (incision) made during an abdominal surgery. HOME CARE INSTRUCTIONS   Bed rest is not required. You may continue your normal activities.  Avoid lifting more than 10 pounds (4.5 kg) or straining.  Cough gently. If you are a smoker it is best to stop. Even the best hernia repair can break down with the continual strain of coughing. Even if you do not have your hernia repaired, a cough will continue to aggravate the problem.  Do not wear anything tight over your hernia. Do not try to keep it in with an outside bandage or truss. These can damage abdominal contents if they are trapped within the hernia sac.  Eat a normal diet.  Avoid constipation. Straining over long periods of time will increase hernia size and encourage breakdown of repairs. If you cannot do this with diet alone, stool softeners may be used. SEEK IMMEDIATE MEDICAL CARE IF:   You have a fever.  You develop increasing abdominal pain.  You feel nauseous or vomit.  Your hernia is stuck outside the abdomen, looks  discolored, feels hard, or is tender.  You have any changes in your bowel habits or in the hernia that are unusual for you.  You have increased pain or swelling around the hernia.  You cannot push the hernia back in place by applying gentle pressure while lying down. MAKE SURE YOU:   Understand these instructions.  Will watch your condition.  Will get help right away if you are not doing well or get worse. Document Released: 03/30/2005 Document Revised: 06/22/2011 Document Reviewed: 11/17/2007 [facility] Patient Information 2014 [place].

## 2013-05-10 NOTE — ED Notes (Signed)
Tatiyana PA-C at bedside.  

## 2013-05-12 NOTE — ED Provider Notes (Signed)
Medical screening examination/treatment/procedure(s) were performed by non-physician practitioner and as supervising physician I was immediately available for consultation/collaboration.  Leota Jacobsen, MD 05/12/13 [phone]

## 2013-05-19 ENCOUNTER — Ambulatory Visit (INDEPENDENT_AMBULATORY_CARE_PROVIDER_SITE_OTHER): Payer: Medicare Other | Admitting: Surgery

## 2013-05-19 ENCOUNTER — Encounter (INDEPENDENT_AMBULATORY_CARE_PROVIDER_SITE_OTHER): Payer: Self-pay | Admitting: Surgery

## 2013-05-19 ENCOUNTER — Encounter (HOSPITAL_COMMUNITY): Payer: Self-pay | Admitting: Pharmacy Technician

## 2013-05-19 VITALS — BP 112/76 | HR 68 | Temp 98.2°F | Resp 16 | Ht 66.0 in | Wt 195.0 lb

## 2013-05-19 DIAGNOSIS — K439 Ventral hernia without obstruction or gangrene: Secondary | ICD-10-CM | POA: Diagnosis not present

## 2013-05-19 NOTE — Progress Notes (Signed)
Patient ID: Shannon [name], female   DOB: Jan 04, 1956, 58 y.o.   MRN: [id number]  Chief Complaint  Patient presents with  . Hernia    new pt    HPI Shannon [name] is a 58 y.o. female.  Referred by Dr. Lacretia Leigh - EDP  HPI This is a 58 year old female who is on chronic disability secondary to back pain. She presented to the emergency department last week with swelling in her epigastrium associated with some pain. She has noticed this swelling for the last several months and it seems to get larger.  It resolves slightly when she is supine.  She denies any nausea, vomiting, or constipation. She was evaluated in the ED and they were able to reduce this area. No imaging was performed.  Past Medical History  Diagnosis Date  . Hypertension   Obesity Hyperlipidemia Chronic back pain  Past Surgical History  Procedure Laterality Date  . Abdominal hysterectomy    . Back surgery    . Fracture surgery      left leg    History reviewed. No pertinent family history.  Social History History  Substance Use Topics  . Smoking status: Former Research scientist (life sciences)  . Smokeless tobacco: Not on file  . Alcohol Use: No    No Known Allergies  Current Outpatient Prescriptions  Medication Sig Dispense Refill  . fenofibrate (TRICOR) 48 MG tablet Take 48 mg by mouth daily.      . furosemide (LASIX) 20 MG tablet Take 20 mg by mouth daily.      Marland Kitchen lisinopril (PRINIVIL,ZESTRIL) 20 MG tablet Take 20 mg by mouth daily.      . meloxicam (MOBIC) 7.5 MG tablet Take 7.5 mg by mouth daily.      . metoprolol succinate (TOPROL-XL) 100 MG 24 hr tablet Take 100 mg by mouth daily. Take with or immediately following a meal.      . traMADol (ULTRAM) 50 MG tablet Take 1 tablet (50 mg total) by mouth every 6 (six) hours as needed.  15 tablet  0   No current facility-administered medications for this visit.    Review of Systems Review of Systems  Constitutional: Negative for fever, chills and unexpected weight change.   HENT: Negative for congestion, hearing loss, sore throat, trouble swallowing and voice change.   Eyes: Negative for visual disturbance.  Respiratory: Negative for cough and wheezing.   Cardiovascular: Negative for chest pain, palpitations and leg swelling.  Gastrointestinal: Positive for abdominal pain and abdominal distention. Negative for nausea, vomiting, diarrhea, constipation, blood in stool and anal bleeding.  Genitourinary: Negative for hematuria, vaginal bleeding and difficulty urinating.  Musculoskeletal: Negative for arthralgias.  Skin: Negative for rash and wound.  Neurological: Negative for seizures, syncope and headaches.  Hematological: Negative for adenopathy. Does not bruise/bleed easily.  Psychiatric/Behavioral: Negative for confusion.    Blood pressure 112/76, pulse 68, temperature 98.2 F (36.8 C), temperature source Oral, resp. rate 16, height 5\' 6"  (1.676 m), weight 195 lb (88.451 kg).  Physical Exam Physical Exam WDWN in NAD HEENT:  EOMI, sclera anicteric Neck:  No masses, no thyromegaly Lungs:  CTA bilaterally; normal respiratory effort CV:  Regular rate and rhythm; no murmurs Abd:  +bowel sounds, soft, protruding 6 cm mass in epigastrium; reducible when supine; unable to palpate fascial defect Ext:  Well-perfused; no edema Skin:  Warm, dry; no sign of jaundice  Data Reviewed none  Assessment    Epigastric ventral hernia - likely containing preperitoneal fat; partially reducible  Plan    Open ventral hernia repair with mesh.  The surgical procedure has been discussed with the patient.  Potential risks, benefits, alternative treatments, and expected outcomes have been explained.  All of the patient's questions at this time have been answered.  The likelihood of reaching the patient's treatment goal is good.  The patient understand the proposed surgical procedure and wishes to proceed.         Tracer Gutridge K. 05/19/2013, 10:39 AM    

## 2013-05-22 ENCOUNTER — Other Ambulatory Visit: Payer: Self-pay

## 2013-05-22 ENCOUNTER — Encounter (HOSPITAL_COMMUNITY): Payer: Self-pay

## 2013-05-22 ENCOUNTER — Encounter (HOSPITAL_COMMUNITY)
Admission: RE | Admit: 2013-05-22 | Discharge: 2013-05-22 | Disposition: A | Discharge status: Home / Self Care | Payer: Medicare Other | Source: Ambulatory Visit | Attending: Surgery | Admitting: Surgery

## 2013-05-22 ENCOUNTER — Encounter (HOSPITAL_COMMUNITY)
Admission: RE | Admit: 2013-05-22 | Discharge: 2013-05-22 | Disposition: A | Discharge status: Home / Self Care | Payer: Medicare Other | Source: Ambulatory Visit | Attending: Anesthesiology | Admitting: Anesthesiology

## 2013-05-22 DIAGNOSIS — Z87891 Personal history of nicotine dependence: Secondary | ICD-10-CM | POA: Diagnosis not present

## 2013-05-22 DIAGNOSIS — E785 Hyperlipidemia, unspecified: Secondary | ICD-10-CM | POA: Diagnosis not present

## 2013-05-22 DIAGNOSIS — I10 Essential (primary) hypertension: Secondary | ICD-10-CM | POA: Diagnosis not present

## 2013-05-22 DIAGNOSIS — E669 Obesity, unspecified: Secondary | ICD-10-CM | POA: Diagnosis not present

## 2013-05-22 DIAGNOSIS — K439 Ventral hernia without obstruction or gangrene: Secondary | ICD-10-CM | POA: Diagnosis not present

## 2013-05-22 DIAGNOSIS — Z01818 Encounter for other preprocedural examination: Secondary | ICD-10-CM | POA: Diagnosis not present

## 2013-05-22 DIAGNOSIS — Z01812 Encounter for preprocedural laboratory examination: Secondary | ICD-10-CM | POA: Diagnosis not present

## 2013-05-22 DIAGNOSIS — Z01810 Encounter for preprocedural cardiovascular examination: Secondary | ICD-10-CM | POA: Diagnosis not present

## 2013-05-22 DIAGNOSIS — M549 Dorsalgia, unspecified: Secondary | ICD-10-CM | POA: Diagnosis not present

## 2013-05-22 DIAGNOSIS — G8929 Other chronic pain: Secondary | ICD-10-CM | POA: Diagnosis not present

## 2013-05-22 HISTORY — DX: Edema, unspecified: R60.9

## 2013-05-22 HISTORY — DX: Anesthesia of skin: R20.0

## 2013-05-22 HISTORY — DX: Localized edema: R60.0

## 2013-05-22 HISTORY — DX: Dizziness and giddiness: R42

## 2013-05-22 HISTORY — DX: Ventral hernia without obstruction or gangrene: K43.9

## 2013-05-22 HISTORY — DX: Hyperlipidemia, unspecified: E78.5

## 2013-05-22 LAB — BASIC METABOLIC PANEL
BUN: 20 mg/dL (ref 6–23)
CO2: 24 mEq/L (ref 19–32)
Calcium: 9.7 mg/dL (ref 8.4–10.5)
Chloride: 105 mEq/L (ref 96–112)
Creatinine, Ser: 1.22 mg/dL — ABNORMAL HIGH (ref 0.50–1.10)
GFR, EST AFRICAN AMERICAN: 57 mL/min — AB (ref >90)
GFR, EST NON AFRICAN AMERICAN: 49 mL/min — AB (ref >90)
GLUCOSE: 92 mg/dL (ref 70–99)
Potassium: 5.1 mEq/L (ref 3.7–5.3)
SODIUM: 144 meq/L (ref 137–147)

## 2013-05-22 LAB — CBC
HCT: 42.2 % (ref 36.0–46.0)
HEMOGLOBIN: 13.6 g/dL (ref 12.0–15.0)
MCH: 23.9 pg — AB (ref 26.0–34.0)
MCHC: 32.2 g/dL (ref 30.0–36.0)
MCV: 74.3 fL — ABNORMAL LOW (ref 78.0–100.0)
Platelets: 327 10*3/uL (ref 150–400)
RBC: 5.68 MIL/uL — ABNORMAL HIGH (ref 3.87–5.11)
RDW: 14.6 % (ref 11.5–15.5)
WBC: 7 10*3/uL (ref 4.0–10.5)

## 2013-05-22 MED ORDER — CHLORHEXIDINE GLUCONATE 4 % EX LIQD: 1.0000 "application " | Freq: Once | CUTANEOUS | Status: DC

## 2013-05-22 NOTE — Pre-Procedure Instructions (Signed)
Shannon [name]  05/22/2013   Your procedure is scheduled on:  Wed, Feb 11 @ 2:05 PM  Report to [facility] Stay Entrance A  at 12:00 PM.  Call this number if you have problems the morning of surgery: [phone]   Remember:   Do not eat food or drink liquids after midnight.   Take these medicines the morning of surgery with A SIP OF WATER: Metoprolol(Toprol) and Ultram(Tramadol-if needed)               Stop taking your Mobic. No Goody's,BC's,Aleve,Aspirin,Ibuprofen,Fish Oil,or any Herbal Medications   Do not wear jewelry, make-up or nail polish.  Do not wear lotions, powders, or perfumes. You may wear deodorant.  Do not shave 48 hours prior to surgery.   Do not bring valuables to the hospital.  [facility] is not responsible                  for any belongings or valuables.               Contacts, dentures or bridgework may not be worn into surgery.  Leave suitcase in the car. After surgery it may be brought to your room.  For patients admitted to the hospital, discharge time is determined by your                treatment team.               Patients discharged the day of surgery will not be allowed to drive  home.    Special Instructions: [facility] - Preparing for Surgery  Before surgery, you can play an important role.  Because skin is not sterile, your skin needs to be as free of germs as possible.  You can reduce the number of germs on you skin by washing with CHG (chlorahexidine gluconate) soap before surgery.  CHG is an antiseptic cleaner which kills germs and bonds with the skin to continue killing germs even after washing.  Please DO NOT use if you have an allergy to CHG or antibacterial soaps.  If your skin becomes reddened/irritated stop using the CHG and inform your nurse when you arrive at Short Stay.  Do not shave (including legs and underarms) for at least 48 hours prior to the first CHG shower.  You may shave your face.  Please follow these instructions  carefully:   1.  Shower with CHG Soap the night before surgery and the                                morning of Surgery.  2.  If you choose to wash your hair, wash your hair first as usual with your       normal shampoo.  3.  After you shampoo, rinse your hair and body thoroughly to remove the                      Shampoo.  4.  Use CHG as you would any other liquid soap.  You can apply chg directly       to the skin and wash gently with scrungie or a clean washcloth.  5.  Apply the CHG Soap to your body ONLY FROM THE NECK DOWN.        Do not use on open wounds or open sores.  Avoid contact with your eyes,  ears, mouth and genitals (private parts).  Wash genitals (private parts)       with your normal soap.  6.  Wash thoroughly, paying special attention to the area where your surgery        will be performed.  7.  Thoroughly rinse your body with warm water from the neck down.  8.  DO NOT shower/wash with your normal soap after using and rinsing off       the CHG Soap.  9.  Pat yourself dry with a clean towel.            10.  Wear clean pajamas.            11.  Place clean sheets on your bed the night of your first shower and do not        sleep with pets.  Day of Surgery  Do not apply any lotions/deoderants the morning of surgery.  Please wear clean clothes to the hospital/surgery center.     Please read over the following fact sheets that you were given: Pain Booklet, Coughing and Deep Breathing and Surgical Site Infection Prevention

## 2013-05-22 NOTE — Progress Notes (Signed)
Pt doesn't have a cardiologist  Denies ever having an echo/stress test/heart cath  Medical Md is Dr Ollen Gross [facility]  Denies EKG or CXR in past yr

## 2013-05-23 MED ORDER — CEFAZOLIN SODIUM-DEXTROSE 2-3 GM-% IV SOLR
2.0000 g | INTRAVENOUS | Status: DC
  Filled 2013-05-23: qty 50

## 2013-05-24 ENCOUNTER — Encounter (HOSPITAL_COMMUNITY)
Admission: RE | Disposition: A | Discharge status: Home / Self Care | Payer: Self-pay | Source: Ambulatory Visit | Attending: Surgery

## 2013-05-24 ENCOUNTER — Ambulatory Visit (HOSPITAL_COMMUNITY): Payer: Medicare Other | Admitting: Anesthesiology

## 2013-05-24 ENCOUNTER — Encounter (HOSPITAL_COMMUNITY): Payer: Self-pay | Admitting: *Deleted

## 2013-05-24 ENCOUNTER — Encounter (HOSPITAL_COMMUNITY): Payer: Medicare Other | Admitting: Anesthesiology

## 2013-05-24 ENCOUNTER — Ambulatory Visit (HOSPITAL_COMMUNITY)
Admission: RE | Admit: 2013-05-24 | Discharge: 2013-05-24 | Disposition: A | Discharge status: Home / Self Care | Payer: Medicare Other | Source: Ambulatory Visit | Attending: Surgery | Admitting: Surgery

## 2013-05-24 DIAGNOSIS — M549 Dorsalgia, unspecified: Secondary | ICD-10-CM | POA: Insufficient documentation

## 2013-05-24 DIAGNOSIS — E785 Hyperlipidemia, unspecified: Secondary | ICD-10-CM | POA: Diagnosis not present

## 2013-05-24 DIAGNOSIS — E669 Obesity, unspecified: Secondary | ICD-10-CM | POA: Insufficient documentation

## 2013-05-24 DIAGNOSIS — Z87891 Personal history of nicotine dependence: Secondary | ICD-10-CM | POA: Insufficient documentation

## 2013-05-24 DIAGNOSIS — I10 Essential (primary) hypertension: Secondary | ICD-10-CM | POA: Diagnosis not present

## 2013-05-24 DIAGNOSIS — G8929 Other chronic pain: Secondary | ICD-10-CM | POA: Insufficient documentation

## 2013-05-24 DIAGNOSIS — K439 Ventral hernia without obstruction or gangrene: Secondary | ICD-10-CM | POA: Diagnosis not present

## 2013-05-24 DIAGNOSIS — Z01818 Encounter for other preprocedural examination: Secondary | ICD-10-CM | POA: Insufficient documentation

## 2013-05-24 DIAGNOSIS — Z01812 Encounter for preprocedural laboratory examination: Secondary | ICD-10-CM | POA: Insufficient documentation

## 2013-05-24 DIAGNOSIS — Z01810 Encounter for preprocedural cardiovascular examination: Secondary | ICD-10-CM | POA: Insufficient documentation

## 2013-05-24 HISTORY — PX: VENTRAL HERNIA REPAIR: SHX424

## 2013-05-24 HISTORY — PX: INSERTION OF MESH: SHX5868

## 2013-05-24 SURGERY — REPAIR, HERNIA, VENTRAL
Anesthesia: General | Site: Abdomen

## 2013-05-24 MED ORDER — ROCURONIUM BROMIDE 100 MG/10ML IV SOLN
INTRAVENOUS | Status: DC | PRN
  Administered 2013-05-24: 50 mg via INTRAVENOUS

## 2013-05-24 MED ORDER — HYDROMORPHONE HCL PF 1 MG/ML IJ SOLN
INTRAMUSCULAR | Status: AC
  Administered 2013-05-24: 0.5 mg via INTRAVENOUS
  Filled 2013-05-24: qty 1

## 2013-05-24 MED ORDER — OXYCODONE HCL 5 MG/5ML PO SOLN: 5.0000 mg | Freq: Once | ORAL | Status: DC | PRN

## 2013-05-24 MED ORDER — BUPIVACAINE-EPINEPHRINE (PF) 0.25% -1:200000 IJ SOLN
INTRAMUSCULAR | Status: AC
  Filled 2013-05-24: qty 30

## 2013-05-24 MED ORDER — FENTANYL CITRATE 0.05 MG/ML IJ SOLN
INTRAMUSCULAR | Status: AC
  Filled 2013-05-24: qty 5

## 2013-05-24 MED ORDER — HYDROMORPHONE HCL PF 1 MG/ML IJ SOLN
0.2500 mg | INTRAMUSCULAR | Status: DC | PRN
  Administered 2013-05-24 (×4): 0.5 mg via INTRAVENOUS

## 2013-05-24 MED ORDER — LACTATED RINGERS IV SOLN
INTRAVENOUS | Status: DC
  Administered 2013-05-24: 12:00:00 via INTRAVENOUS

## 2013-05-24 MED ORDER — PROPOFOL 10 MG/ML IV BOLUS
INTRAVENOUS | Status: DC | PRN
  Administered 2013-05-24: 180 mg via INTRAVENOUS

## 2013-05-24 MED ORDER — ONDANSETRON HCL 4 MG/2ML IJ SOLN
4.0000 mg | INTRAMUSCULAR | Status: DC | PRN
  Filled 2013-05-24: qty 2

## 2013-05-24 MED ORDER — GLYCOPYRROLATE 0.2 MG/ML IJ SOLN
INTRAMUSCULAR | Status: DC | PRN
  Administered 2013-05-24: .8 mg via INTRAVENOUS

## 2013-05-24 MED ORDER — MIDAZOLAM HCL 2 MG/2ML IJ SOLN: 1.0000 mg | INTRAMUSCULAR | Status: DC | PRN

## 2013-05-24 MED ORDER — OXYCODONE-ACETAMINOPHEN 5-325 MG PO TABS
ORAL_TABLET | ORAL | Status: AC
  Administered 2013-05-24: 1 via ORAL
  Filled 2013-05-24: qty 1

## 2013-05-24 MED ORDER — PROPOFOL 10 MG/ML IV BOLUS
INTRAVENOUS | Status: AC
  Filled 2013-05-24: qty 20

## 2013-05-24 MED ORDER — LACTATED RINGERS IV SOLN
INTRAVENOUS | Status: DC | PRN
  Administered 2013-05-24: 13:00:00 via INTRAVENOUS

## 2013-05-24 MED ORDER — 0.9 % SODIUM CHLORIDE (POUR BTL) OPTIME
TOPICAL | Status: DC | PRN
  Administered 2013-05-24: 1000 mL

## 2013-05-24 MED ORDER — MIDAZOLAM HCL 2 MG/2ML IJ SOLN
INTRAMUSCULAR | Status: AC
  Filled 2013-05-24: qty 2

## 2013-05-24 MED ORDER — OXYCODONE-ACETAMINOPHEN 5-325 MG PO TABS: 1.0000 | ORAL_TABLET | ORAL | Status: DC | PRN

## 2013-05-24 MED ORDER — ONDANSETRON HCL 4 MG/2ML IJ SOLN
INTRAMUSCULAR | Status: DC | PRN
  Administered 2013-05-24: 4 mg via INTRAVENOUS

## 2013-05-24 MED ORDER — ROCURONIUM BROMIDE 50 MG/5ML IV SOLN
INTRAVENOUS | Status: AC
  Filled 2013-05-24: qty 1

## 2013-05-24 MED ORDER — FENTANYL CITRATE 0.05 MG/ML IJ SOLN
INTRAMUSCULAR | Status: DC | PRN
  Administered 2013-05-24: 50 ug via INTRAVENOUS

## 2013-05-24 MED ORDER — ONDANSETRON HCL 4 MG/2ML IJ SOLN
INTRAMUSCULAR | Status: AC
  Filled 2013-05-24: qty 2

## 2013-05-24 MED ORDER — GLYCOPYRROLATE 0.2 MG/ML IJ SOLN
INTRAMUSCULAR | Status: AC
  Filled 2013-05-24: qty 4

## 2013-05-24 MED ORDER — OXYCODONE-ACETAMINOPHEN 5-325 MG PO TABS
1.0000 | ORAL_TABLET | ORAL | Status: DC | PRN
  Administered 2013-05-24: 1 via ORAL

## 2013-05-24 MED ORDER — FENTANYL CITRATE 0.05 MG/ML IJ SOLN: 50.0000 ug | Freq: Once | INTRAMUSCULAR | Status: DC

## 2013-05-24 MED ORDER — PHENYLEPHRINE 40 MCG/ML (10ML) SYRINGE FOR IV PUSH (FOR BLOOD PRESSURE SUPPORT)
PREFILLED_SYRINGE | INTRAVENOUS | Status: AC
  Filled 2013-05-24: qty 10

## 2013-05-24 MED ORDER — NEOSTIGMINE METHYLSULFATE 1 MG/ML IJ SOLN
INTRAMUSCULAR | Status: DC | PRN
  Administered 2013-05-24: 5 mg via INTRAVENOUS

## 2013-05-24 MED ORDER — MORPHINE SULFATE 2 MG/ML IJ SOLN
INTRAMUSCULAR | Status: AC
  Administered 2013-05-24: 2 mg via INTRAVENOUS
  Filled 2013-05-24: qty 1

## 2013-05-24 MED ORDER — OXYCODONE HCL 5 MG PO TABS: 5.0000 mg | ORAL_TABLET | Freq: Once | ORAL | Status: DC | PRN

## 2013-05-24 MED ORDER — MIDAZOLAM HCL 5 MG/5ML IJ SOLN
INTRAMUSCULAR | Status: DC | PRN
  Administered 2013-05-24: 1 mg via INTRAVENOUS

## 2013-05-24 MED ORDER — PROMETHAZINE HCL 25 MG/ML IJ SOLN: 6.2500 mg | INTRAMUSCULAR | Status: DC | PRN

## 2013-05-24 MED ORDER — MORPHINE SULFATE 2 MG/ML IJ SOLN
2.0000 mg | INTRAMUSCULAR | Status: DC | PRN
  Administered 2013-05-24: 2 mg via INTRAVENOUS

## 2013-05-24 MED ORDER — LIDOCAINE HCL (CARDIAC) 20 MG/ML IV SOLN
INTRAVENOUS | Status: DC | PRN
  Administered 2013-05-24: 100 mg via INTRAVENOUS

## 2013-05-24 MED ORDER — DIPHENHYDRAMINE HCL 50 MG/ML IJ SOLN
INTRAMUSCULAR | Status: AC
  Administered 2013-05-24: 6.25 mg
  Filled 2013-05-24: qty 1

## 2013-05-24 MED ORDER — ARTIFICIAL TEARS OP OINT
TOPICAL_OINTMENT | OPHTHALMIC | Status: DC | PRN
  Administered 2013-05-24: 1 via OPHTHALMIC

## 2013-05-24 MED ORDER — BUPIVACAINE-EPINEPHRINE 0.25% -1:200000 IJ SOLN
INTRAMUSCULAR | Status: DC | PRN
  Administered 2013-05-24: 10 mL

## 2013-05-24 MED ORDER — PHENYLEPHRINE HCL 10 MG/ML IJ SOLN
INTRAMUSCULAR | Status: DC | PRN
  Administered 2013-05-24: 40 ug via INTRAVENOUS
  Administered 2013-05-24: 120 ug via INTRAVENOUS

## 2013-05-24 SURGICAL SUPPLY — 45 items
BENZOIN TINCTURE PRP APPL 2/3 (GAUZE/BANDAGES/DRESSINGS) ×3 IMPLANT
BLADE SURG ROTATE 9660 (MISCELLANEOUS) IMPLANT
CANISTER SUCTION 2500CC (MISCELLANEOUS) ×3 IMPLANT
CHLORAPREP W/TINT 26ML (MISCELLANEOUS) ×3 IMPLANT
CLOSURE WOUND 1/2 X4 (GAUZE/BANDAGES/DRESSINGS) ×1
COVER SURGICAL LIGHT HANDLE (MISCELLANEOUS) ×3 IMPLANT
DRAPE LAPAROSCOPIC ABDOMINAL (DRAPES) ×3 IMPLANT
DRAPE UTILITY 15X26 W/TAPE STR (DRAPE) ×6 IMPLANT
DRSG TEGADERM 4X4.75 (GAUZE/BANDAGES/DRESSINGS) ×3 IMPLANT
ELECT CAUTERY BLADE 6.4 (BLADE) ×3 IMPLANT
ELECT REM PT RETURN 9FT ADLT (ELECTROSURGICAL) ×3
ELECTRODE REM PT RTRN 9FT ADLT (ELECTROSURGICAL) ×1 IMPLANT
GLOVE BIO SURGEON STRL SZ7 (GLOVE) ×3 IMPLANT
GLOVE BIOGEL PI IND STRL 6.5 (GLOVE) ×1 IMPLANT
GLOVE BIOGEL PI IND STRL 7.0 (GLOVE) ×1 IMPLANT
GLOVE BIOGEL PI IND STRL 7.5 (GLOVE) ×1 IMPLANT
GLOVE BIOGEL PI INDICATOR 6.5 (GLOVE) ×2
GLOVE BIOGEL PI INDICATOR 7.0 (GLOVE) ×2
GLOVE BIOGEL PI INDICATOR 7.5 (GLOVE) ×2
GLOVE SURG SS PI 7.0 STRL IVOR (GLOVE) ×3 IMPLANT
GOWN STRL NON-REIN LRG LVL3 (GOWN DISPOSABLE) ×6 IMPLANT
KIT BASIN OR (CUSTOM PROCEDURE TRAY) ×3 IMPLANT
KIT ROOM TURNOVER OR (KITS) ×3 IMPLANT
MESH VENTRALEX ST 1-7/10 CRC S (Mesh General) ×3 IMPLANT
NEEDLE HYPO 25GX1X1/2 BEV (NEEDLE) ×3 IMPLANT
NS IRRIG 1000ML POUR BTL (IV SOLUTION) ×3 IMPLANT
PACK GENERAL/GYN (CUSTOM PROCEDURE TRAY) ×3 IMPLANT
PAD ARMBOARD 7.5X6 YLW CONV (MISCELLANEOUS) ×6 IMPLANT
SPONGE GAUZE 4X4 12PLY (GAUZE/BANDAGES/DRESSINGS) ×3 IMPLANT
SPONGE GAUZE 4X4 12PLY STER LF (GAUZE/BANDAGES/DRESSINGS) ×3 IMPLANT
STAPLER VISISTAT 35W (STAPLE) IMPLANT
STRIP CLOSURE SKIN 1/2X4 (GAUZE/BANDAGES/DRESSINGS) ×2 IMPLANT
SUT MNCRL AB 4-0 PS2 18 (SUTURE) ×3 IMPLANT
SUT NOVA NAB DX-16 0-1 5-0 T12 (SUTURE) ×3 IMPLANT
SUT NOVA NAB GS-21 0 18 T12 DT (SUTURE) ×3 IMPLANT
SUT NOVA NAB GS-21 1 T12 (SUTURE) IMPLANT
SUT VIC AB 2-0 SH 27 (SUTURE) ×2
SUT VIC AB 2-0 SH 27XBRD (SUTURE) ×1 IMPLANT
SUT VIC AB 3-0 SH 27 (SUTURE) ×2
SUT VIC AB 3-0 SH 27XBRD (SUTURE) ×1 IMPLANT
SYR CONTROL 10ML LL (SYRINGE) ×3 IMPLANT
TOWEL OR 17X24 6PK STRL BLUE (TOWEL DISPOSABLE) ×3 IMPLANT
TOWEL OR 17X26 10 PK STRL BLUE (TOWEL DISPOSABLE) ×3 IMPLANT
TRAY FOLEY CATH 14FRSI W/METER (CATHETERS) IMPLANT
WATER STERILE IRR 1000ML POUR (IV SOLUTION) IMPLANT

## 2013-05-24 NOTE — Interval H&P Note (Signed)
History and Physical Interval Note:  05/24/2013 1:39 PM  Shannon [name]  has presented today for surgery, with the diagnosis of Epigastric ventral hernia  The various methods of treatment have been discussed with the patient and family. After consideration of risks, benefits and other options for treatment, the patient has consented to  Procedure(s): OPEN REPAIR EPIGASTRIC VENTRAL HERNIA (N/A) INSERTION OF MESH (N/A) as a surgical intervention .  The patient's history has been reviewed, patient examined, no change in status, stable for surgery.  I have reviewed the patient's chart and labs.  Questions were answered to the patient's satisfaction.     [name]

## 2013-05-24 NOTE — Anesthesia Postprocedure Evaluation (Signed)
  Anesthesia Post-op Note  Patient: Shannon [name]  Procedure(s) Performed: Procedure(s): OPEN REPAIR EPIGASTRIC VENTRAL HERNIA (N/A) INSERTION OF MESH (N/A)  Patient Location: PACU  Anesthesia Type:General  Level of Consciousness: awake and alert   Airway and Oxygen Therapy: Patient Spontanous Breathing  Post-op Pain: mild  Post-op Assessment: Post-op Vital signs reviewed, Patient's Cardiovascular Status Stable, Respiratory Function Stable, Patent Airway, No signs of Nausea or vomiting and Pain level controlled  Post-op Vital Signs: Reviewed and stable  Complications: No apparent anesthesia complications

## 2013-05-24 NOTE — Anesthesia Procedure Notes (Signed)
Procedure Name: Intubation Date/Time: 05/24/2013 2:10 PM Performed by: Williemae Area B Pre-anesthesia Checklist: Patient identified, Emergency Drugs available, Suction available and Patient being monitored Patient Re-evaluated:Patient Re-evaluated prior to inductionOxygen Delivery Method: Circle system utilized Preoxygenation: Pre-oxygenation with 100% oxygen Intubation Type: IV induction Ventilation: Mask ventilation without difficulty Laryngoscope Size: Mac and 3 Grade View: Grade I Tube type: Oral Tube size: 7.5 mm Number of attempts: 1 Airway Equipment and Method: Stylet Placement Confirmation: ETT inserted through vocal cords under direct vision,  breath sounds checked- equal and bilateral and positive ETCO2 Secured at: 21 (cm at teeth) cm Tube secured with: Tape Dental Injury: Teeth and Oropharynx as per pre-operative assessment

## 2013-05-24 NOTE — Discharge Instructions (Signed)
[facility], [place]  VENTRAL HERNIA REPAIR: POST OP INSTRUCTIONS  Always review your discharge instruction sheet given to you by the facility where your surgery was performed. IF YOU HAVE DISABILITY OR FAMILY LEAVE FORMS, YOU MUST BRING THEM TO THE OFFICE FOR PROCESSING.   DO NOT GIVE THEM TO YOUR DOCTOR.  1. A  prescription for pain medication may be given to you upon discharge.  Take your pain medication as prescribed, if needed.  If narcotic pain medicine is not needed, then you may take acetaminophen (Tylenol) or ibuprofen (Advil) as needed. 2. Take your usually prescribed medications unless otherwise directed. 3. If you need a refill on your pain medication, please contact your pharmacy.  They will contact our office to request authorization. Prescriptions will not be filled after 5 pm or on week-ends. 4. You should follow a light diet the first 24 hours after arrival home, such as soup and crackers, etc.  Be sure to include lots of fluids daily.  Resume your normal diet the day after surgery. 5. Most patients will experience some swelling and bruising around the umbilicus or in the groin and scrotum.  Ice packs and reclining will help.  Swelling and bruising can take several days to resolve.  6. It is common to experience some constipation if taking pain medication after surgery.  Increasing fluid intake and taking a stool softener (such as Colace) will usually help or prevent this problem from occurring.  A mild laxative (Milk of Magnesia or Miralax) should be taken according to package directions if there are no bowel movements after 48 hours. 7. Unless discharge instructions indicate otherwise, you may remove your bandages 24-48 hours after surgery, and you may shower at that time.  You will have steri-strips (small skin tapes) in place directly over the incision.  These strips should be left on the skin for 7-10 days. 8. ACTIVITIES:  You may resume regular (light) daily activities  beginning the next day--such as daily self-care, walking, climbing stairs--gradually increasing activities as tolerated.  You may have sexual intercourse when it is comfortable.  Refrain from any heavy lifting or straining until approved by your doctor. a. You may drive when you are no longer taking prescription pain medication, you can comfortably wear a seatbelt, and you can safely maneuver your car and apply brakes. b. RETURN TO WORK:  2-3 weeks with light duty - no lifting over 15 lbs. 9. You should see your doctor in the office for a follow-up appointment approximately 2-3 weeks after your surgery.  Make sure that you call for this appointment within a day or two after you arrive home to insure a convenient appointment time. 10. OTHER INSTRUCTIONS:  __________________________________________________________________________________________________________________________________________________________________________________________  WHEN TO CALL YOUR DOCTOR: 1. Fever over 101.0 2. Inability to urinate 3. Nausea and/or vomiting 4. Extreme swelling or bruising 5. Continued bleeding from incision. 6. Increased pain, redness, or drainage from the incision  The clinic staff is available to answer your questions during regular business hours.  Please dont hesitate to call and ask to speak to one of the nurses for clinical concerns.  If you have a medical emergency, go to the nearest emergency room or call 911.  A surgeon from [facility] is always on call at the hospital   [address], [place], [place], [place]  [address] ?  [address], [place], [place]   [id number] [phone]    FAX [phone] Web site: [email or web address]

## 2013-05-24 NOTE — Preoperative (Signed)
Beta Blockers   Reason not to administer Beta Blockers:Toprol XL 8 a.m. today

## 2013-05-24 NOTE — Op Note (Signed)
Ventral Hernia Repair Procedure Note  Indications: Symptomatic ventral hernia  Pre-operative Diagnosis: Ventral hernia  Post-operative Diagnosis: Ventral hernia  Surgeon: Liston Thum K.   Assistants: none  Anesthesia: General endotracheal anesthesia  ASA Class: 2  Procedure Details  The patient was seen in the Holding Room. The risks, benefits, complications, treatment options, and expected outcomes were discussed with the patient. The possibilities of reaction to medication, pulmonary aspiration, perforation of viscus, bleeding, recurrent infection, the need for additional procedures, failure to diagnose a condition, and creating a complication requiring transfusion or operation were discussed with the patient. The patient concurred with the proposed plan, giving informed consent.  The site of surgery properly noted/marked. The patient was taken to the operating room, identified as Shannon [name] and the procedure verified as ventral hernia repair. A Time Out was held and the above information confirmed.  The patient was placed supine.  After establishing general anesthesia, the abdomen was prepped with Chloraprep and draped in sterile fashion.  We made a 6 cm vertical incision over the palpable hernia in the epigastrium. Dissection was carried down to the hernia sac located above the fascia and mobilized from surrounding structures.  The hernia was reduced. Intact fascia was identified circumferentially around the defect.  The defect measured only about 1 cmWe used a small Ventralex mesh and secured this to the fascia with interrupted 0 Novofil sutures.  The fascial defect was reapproximated with interrupted figure-of-8 1 Novofil sutures.  The subcutaneous tissues were irrigated.  The skin incision was closed with a 4-0 subcuticular closure.  Hemostasis was confirmed.  The skin incision was closed in layers with a 4-0 Vicryl subcuticular closure.  Steri-Strips were applied at the end of  the operation.    Instrument, sponge, and needle counts were correct prior to closure and at the conclusion of the case.   Findings: 1 cm fascial defect with 5 cm hernia sac  Estimated Blood Loss:  Minimal         Drains: none                      Complications:  None; patient tolerated the procedure well.         Disposition: PACU - hemodynamically stable.         Condition: stable  Imogene Burn. Georgette Dover, MD, [facility]  General/ Trauma Surgery  05/24/2013 2:50 PM

## 2013-05-24 NOTE — H&P (View-Only) (Signed)
Patient ID: Shannon [name], female   DOB: Jan 04, 1956, 58 y.o.   MRN: [id number]  Chief Complaint  Patient presents with  . Hernia    new pt    HPI Shannon [name] is a 58 y.o. female.  Referred by Dr. Lacretia Leigh - EDP  HPI This is a 58 year old female who is on chronic disability secondary to back pain. She presented to the emergency department last week with swelling in her epigastrium associated with some pain. She has noticed this swelling for the last several months and it seems to get larger.  It resolves slightly when she is supine.  She denies any nausea, vomiting, or constipation. She was evaluated in the ED and they were able to reduce this area. No imaging was performed.  Past Medical History  Diagnosis Date  . Hypertension   Obesity Hyperlipidemia Chronic back pain  Past Surgical History  Procedure Laterality Date  . Abdominal hysterectomy    . Back surgery    . Fracture surgery      left leg    History reviewed. No pertinent family history.  Social History History  Substance Use Topics  . Smoking status: Former Research scientist (life sciences)  . Smokeless tobacco: Not on file  . Alcohol Use: No    No Known Allergies  Current Outpatient Prescriptions  Medication Sig Dispense Refill  . fenofibrate (TRICOR) 48 MG tablet Take 48 mg by mouth daily.      . furosemide (LASIX) 20 MG tablet Take 20 mg by mouth daily.      Marland Kitchen lisinopril (PRINIVIL,ZESTRIL) 20 MG tablet Take 20 mg by mouth daily.      . meloxicam (MOBIC) 7.5 MG tablet Take 7.5 mg by mouth daily.      . metoprolol succinate (TOPROL-XL) 100 MG 24 hr tablet Take 100 mg by mouth daily. Take with or immediately following a meal.      . traMADol (ULTRAM) 50 MG tablet Take 1 tablet (50 mg total) by mouth every 6 (six) hours as needed.  15 tablet  0   No current facility-administered medications for this visit.    Review of Systems Review of Systems  Constitutional: Negative for fever, chills and unexpected weight change.   HENT: Negative for congestion, hearing loss, sore throat, trouble swallowing and voice change.   Eyes: Negative for visual disturbance.  Respiratory: Negative for cough and wheezing.   Cardiovascular: Negative for chest pain, palpitations and leg swelling.  Gastrointestinal: Positive for abdominal pain and abdominal distention. Negative for nausea, vomiting, diarrhea, constipation, blood in stool and anal bleeding.  Genitourinary: Negative for hematuria, vaginal bleeding and difficulty urinating.  Musculoskeletal: Negative for arthralgias.  Skin: Negative for rash and wound.  Neurological: Negative for seizures, syncope and headaches.  Hematological: Negative for adenopathy. Does not bruise/bleed easily.  Psychiatric/Behavioral: Negative for confusion.    Blood pressure 112/76, pulse 68, temperature 98.2 F (36.8 C), temperature source Oral, resp. rate 16, height 5\' 6"  (1.676 m), weight 195 lb (88.451 kg).  Physical Exam Physical Exam WDWN in NAD HEENT:  EOMI, sclera anicteric Neck:  No masses, no thyromegaly Lungs:  CTA bilaterally; normal respiratory effort CV:  Regular rate and rhythm; no murmurs Abd:  +bowel sounds, soft, protruding 6 cm mass in epigastrium; reducible when supine; unable to palpate fascial defect Ext:  Well-perfused; no edema Skin:  Warm, dry; no sign of jaundice  Data Reviewed none  Assessment    Epigastric ventral hernia - likely containing preperitoneal fat; partially reducible  Plan    Open ventral hernia repair with mesh.  The surgical procedure has been discussed with the patient.  Potential risks, benefits, alternative treatments, and expected outcomes have been explained.  All of the patient's questions at this time have been answered.  The likelihood of reaching the patient's treatment goal is good.  The patient understand the proposed surgical procedure and wishes to proceed.         Hermione Havlicek K. 05/19/2013, 10:39 AM

## 2013-05-24 NOTE — Transfer of Care (Signed)
Immediate Anesthesia Transfer of Care Note  Patient: Shannon [name]  Procedure(s) Performed: Procedure(s): OPEN REPAIR EPIGASTRIC VENTRAL HERNIA (N/A) INSERTION OF MESH (N/A)  Patient Location: PACU  Anesthesia Type:General  Level of Consciousness: awake, alert  and patient cooperative  Airway & Oxygen Therapy: Patient Spontanous Breathing and Patient connected to face mask oxygen  Post-op Assessment: Report given to PACU RN and Post -op Vital signs reviewed and stable  Post vital signs: Reviewed and stable  Complications: No apparent anesthesia complications

## 2013-05-24 NOTE — Anesthesia Preprocedure Evaluation (Addendum)
Anesthesia Evaluation  Patient identified by MRN, date of birth, ID band Patient awake    Reviewed: Allergy & Precautions, H&P , NPO status , Patient's Chart, lab work & pertinent test results, reviewed documented beta blocker date and time   Airway Mallampati: II TM Distance: >3 FB Neck ROM: Full    Dental  (+) Teeth Intact, Dental Advisory Given   Pulmonary former smoker,  Quit cigarettes times 2 yrs. breath sounds clear to auscultation        Cardiovascular hypertension, Pt. on home beta blockers Rhythm:Regular Rate:Normal     Neuro/Psych Depression Chronic dizziness    GI/Hepatic   Endo/Other    Renal/GU      Musculoskeletal   Abdominal (+) + obese,   Peds  Hematology   Anesthesia Other Findings   Reproductive/Obstetrics                         Anesthesia Physical Anesthesia Plan  ASA: II  Anesthesia Plan: General   Post-op Pain Management:    Induction: Intravenous  Airway Management Planned: Oral ETT  Additional Equipment:   Intra-op Plan:   Post-operative Plan: Extubation in OR  Informed Consent: I have reviewed the patients History and Physical, chart, labs and discussed the procedure including the risks, benefits and alternatives for the proposed anesthesia with the patient or authorized representative who has indicated his/her understanding and acceptance.     Plan Discussed with: CRNA and Surgeon  Anesthesia Plan Comments:         Anesthesia Quick Evaluation

## 2013-05-25 ENCOUNTER — Encounter (HOSPITAL_COMMUNITY): Payer: Self-pay | Admitting: Surgery

## 2013-06-06 ENCOUNTER — Ambulatory Visit: Payer: Medicare Other

## 2013-06-12 ENCOUNTER — Encounter (INDEPENDENT_AMBULATORY_CARE_PROVIDER_SITE_OTHER): Payer: Self-pay

## 2013-06-12 ENCOUNTER — Encounter (INDEPENDENT_AMBULATORY_CARE_PROVIDER_SITE_OTHER): Payer: Self-pay | Admitting: Surgery

## 2013-06-12 ENCOUNTER — Ambulatory Visit (INDEPENDENT_AMBULATORY_CARE_PROVIDER_SITE_OTHER): Payer: Medicare Other | Admitting: Surgery

## 2013-06-12 VITALS — BP 134/90 | HR 72 | Temp 98.2°F | Resp 18 | Ht 66.0 in | Wt 196.0 lb

## 2013-06-12 DIAGNOSIS — K439 Ventral hernia without obstruction or gangrene: Secondary | ICD-10-CM

## 2013-06-12 NOTE — Progress Notes (Signed)
Status post open repair of a small epigastric ventral hernia with mesh on 05/24/13. The patient had a 1.5 cm defect. There was a large amount of incarcerated preperitoneal fat.  The hernia was repaired with a small ventral extra mesh. She is doing quite well. The soreness is resolving. Her incision is well healed no sign of infection. No sign of recurrent hernia.  She may slowly begin increasing her level of activity. Followup as needed.  Imogene Burn. Georgette Dover, MD, [facility]  General/ Trauma Surgery  06/12/2013 2:00 PM

## 2013-06-14 ENCOUNTER — Ambulatory Visit: Payer: Medicare Other | Attending: Internal Medicine | Admitting: Internal Medicine

## 2013-06-14 VITALS — BP 139/92 | HR 80 | Temp 98.3°F | Resp 17 | Wt 195.8 lb

## 2013-06-14 DIAGNOSIS — E785 Hyperlipidemia, unspecified: Secondary | ICD-10-CM

## 2013-06-14 DIAGNOSIS — I10 Essential (primary) hypertension: Secondary | ICD-10-CM | POA: Diagnosis not present

## 2013-06-14 DIAGNOSIS — E119 Type 2 diabetes mellitus without complications: Secondary | ICD-10-CM | POA: Diagnosis not present

## 2013-06-14 LAB — COMPLETE METABOLIC PANEL WITH GFR
ALBUMIN: 4.4 g/dL (ref 3.5–5.2)
ALT: 31 U/L (ref 0–35)
AST: 37 U/L (ref 0–37)
Alkaline Phosphatase: 63 U/L (ref 39–117)
BILIRUBIN TOTAL: 0.6 mg/dL (ref 0.2–1.2)
BUN: 17 mg/dL (ref 6–23)
CO2: 24 mEq/L (ref 19–32)
CREATININE: 0.9 mg/dL (ref 0.50–1.10)
Calcium: 9.9 mg/dL (ref 8.4–10.5)
Chloride: 105 mEq/L (ref 96–112)
GFR, Est African American: 83 mL/min
GFR, Est Non African American: 72 mL/min
Glucose, Bld: 83 mg/dL (ref 70–99)
POTASSIUM: 4.7 meq/L (ref 3.5–5.3)
Sodium: 140 mEq/L (ref 135–145)
Total Protein: 8.3 g/dL (ref 6.0–8.3)

## 2013-06-14 LAB — CBC WITH DIFFERENTIAL/PLATELET
BASOS ABS: 0 10*3/uL (ref 0.0–0.1)
Basophils Relative: 0 % (ref 0–1)
Eosinophils Absolute: 0.2 10*3/uL (ref 0.0–0.7)
Eosinophils Relative: 4 % (ref 0–5)
HEMATOCRIT: 43.4 % (ref 36.0–46.0)
Hemoglobin: 14.1 g/dL (ref 12.0–15.0)
Lymphocytes Relative: 41 % (ref 12–46)
Lymphs Abs: 1.9 10*3/uL (ref 0.7–4.0)
MCH: 23.8 pg — ABNORMAL LOW (ref 26.0–34.0)
MCHC: 32.5 g/dL (ref 30.0–36.0)
MCV: 73.3 fL — ABNORMAL LOW (ref 78.0–100.0)
Monocytes Absolute: 0.3 10*3/uL (ref 0.1–1.0)
Monocytes Relative: 6 % (ref 3–12)
NEUTROS ABS: 2.3 10*3/uL (ref 1.7–7.7)
Neutrophils Relative %: 49 % (ref 43–77)
Platelets: 305 10*3/uL (ref 150–400)
RBC: 5.92 MIL/uL — ABNORMAL HIGH (ref 3.87–5.11)
RDW: 15.7 % — ABNORMAL HIGH (ref 11.5–15.5)
WBC: 4.6 10*3/uL (ref 4.0–10.5)

## 2013-06-14 LAB — LIPID PANEL
CHOLESTEROL: 197 mg/dL (ref 0–200)
HDL: 80 mg/dL (ref >39)
LDL Cholesterol: 92 mg/dL (ref 0–99)
TRIGLYCERIDES: 124 mg/dL (ref <150)
Total CHOL/HDL Ratio: 2.5 Ratio
VLDL: 25 mg/dL (ref 0–40)

## 2013-06-14 LAB — POCT GLYCOSYLATED HEMOGLOBIN (HGB A1C): HEMOGLOBIN A1C: 5.4

## 2013-06-14 MED ORDER — TRAMADOL HCL 50 MG PO TABS: 50.0000 mg | ORAL_TABLET | Freq: Four times a day (QID) | ORAL | Status: DC | PRN

## 2013-06-14 MED ORDER — METOPROLOL SUCCINATE ER 100 MG PO TB24: 100.0000 mg | ORAL_TABLET | Freq: Every day | ORAL | Status: DC

## 2013-06-14 MED ORDER — FENOFIBRATE 48 MG PO TABS: 48.0000 mg | ORAL_TABLET | Freq: Every day | ORAL | Status: DC

## 2013-06-14 MED ORDER — LISINOPRIL 20 MG PO TABS: 20.0000 mg | ORAL_TABLET | Freq: Every day | ORAL | Status: DC

## 2013-06-14 MED ORDER — CYCLOBENZAPRINE HCL 10 MG PO TABS: 10.0000 mg | ORAL_TABLET | Freq: Every day | ORAL | Status: DC

## 2013-06-14 MED ORDER — FUROSEMIDE 20 MG PO TABS: 20.0000 mg | ORAL_TABLET | Freq: Every day | ORAL | Status: DC

## 2013-06-14 NOTE — Progress Notes (Signed)
Patient ID: Shannon [name], female   DOB: [birth year], 58 y.o.   MRN: [id number]   CC:  HPI: 58 year old female here to establish care. History of hypertension, status post ventral hernia repair on 05/24/13, she will follow up with Dr.Matthew K. Tsuei, MD, on 3/2. The patient had a 1.5 cm defect which was repaired with a small ventral extra mesh. The incision is well healed, no signs of infection. Patient denies any other medical history although she does have TriCor listed on her med list. Today she is complaining of low back pain she is status post dissectomy in 2012 by dr Sherwood Gambler, and wants a pain clinic referral, she denies any numbness or tingling in her legs denies any bowel or bladder incontinence. She status post left knee surgery and total hysterectomy Last mammogram was 12/14  Social history Nonsmoker, drinks alcohol occasionally  Family history Brother died of cancer but does not know what kind    No Known Allergies Past Medical History  Diagnosis Date  . Hypertension     takes Lisinopril and Metoprolol daily  . Hyperlipidemia     takes Tricor daily  . Peripheral edema     takes lasix daily  . Dizziness     occasionally  . Numbness     stinging in right hand  . Ventral hernia    Current Outpatient Prescriptions on File Prior to Visit  Medication Sig Dispense Refill  . fenofibrate (TRICOR) 48 MG tablet Take 48 mg by mouth daily.      . furosemide (LASIX) 20 MG tablet Take 20 mg by mouth daily.      . meloxicam (MOBIC) 7.5 MG tablet Take 7.5 mg by mouth daily.      Marland Kitchen oxyCODONE-acetaminophen (PERCOCET/ROXICET) 5-325 MG per tablet Take 1 tablet by mouth every 4 (four) hours as needed for severe pain.  40 tablet  0   No current facility-administered medications on file prior to visit.   History reviewed. No pertinent family history. History   Social History  . Marital Status: Legally Separated    Spouse Name: N/A    Number of Children: N/A  . Years of  Education: N/A   Occupational History  . Not on file.   Social History Main Topics  . Smoking status: Never Smoker   . Smokeless tobacco: Not on file  . Alcohol Use: No  . Drug Use: No  . Sexual Activity: Yes    Birth Control/ Protection: Surgical   Other Topics Concern  . Not on file   Social History Narrative  . No narrative on file    Review of Systems  Constitutional: Negative for fever, chills, diaphoresis, activity change, appetite change and fatigue.  HENT: Negative for ear pain, nosebleeds, congestion, facial swelling, rhinorrhea, neck pain, neck stiffness and ear discharge.   Eyes: Negative for pain, discharge, redness, itching and visual disturbance.  Respiratory: Negative for cough, choking, chest tightness, shortness of breath, wheezing and stridor.   Cardiovascular: Negative for chest pain, palpitations and leg swelling.  Gastrointestinal: Negative for abdominal distention.  Genitourinary: Negative for dysuria, urgency, frequency, hematuria, flank pain, decreased urine volume, difficulty urinating and dyspareunia.  Musculoskeletal: As in history of present illness Neurological: Negative for dizziness, tremors, seizures, syncope, facial asymmetry, speech difficulty, weakness, light-headedness, numbness and headaches.  Hematological: Negative for adenopathy. Does not bruise/bleed easily.  Psychiatric/Behavioral: Negative for hallucinations, behavioral problems, confusion, dysphoric mood, decreased concentration and agitation.    Objective:   Filed Vitals:   06/14/13  1352  BP: 139/92  Pulse: 80  Temp: 98.3 F (36.8 C)  Resp: 17    Physical Exam  Constitutional: Appears well-developed and well-nourished. No distress.  HENT: Normocephalic. External right and left ear normal. Oropharynx is clear and moist.  Eyes: Conjunctivae and EOM are normal. PERRLA, no scleral icterus.  Neck: Normal ROM. Neck supple. No JVD. No tracheal deviation. No thyromegaly.  CVS:  RRR, S1/S2 +, no murmurs, no gallops, no carotid bruit.  Pulmonary: Effort and breath sounds normal, no stridor, rhonchi, wheezes, rales.  Abdominal: Soft. BS +,  no distension, tenderness, rebound or guarding.  Musculoskeletal: Normal range of motion. No edema and no tenderness.  Lymphadenopathy: No lymphadenopathy noted, cervical, inguinal. Neuro: Alert. Normal reflexes, muscle tone coordination. No cranial nerve deficit. Skin: Skin is warm and dry. No rash noted. Not diaphoretic. No erythema. No pallor.  Psychiatric: Normal mood and affect. Behavior, judgment, thought content normal.   Lab Results  Component Value Date   WBC 7.0 05/22/2013   HGB 13.6 05/22/2013   HCT 42.2 05/22/2013   MCV 74.3* 05/22/2013   PLT 327 05/22/2013   Lab Results  Component Value Date   CREATININE 1.22* 05/22/2013   BUN 20 05/22/2013   NA 144 05/22/2013   K 5.1 05/22/2013   CL 105 05/22/2013   CO2 24 05/22/2013    No results found for this basename: HGBA1C   Lipid Panel     Component Value Date/Time   CHOL 175 01/24/2008 2256   TRIG 139 01/24/2008 2256   HDL 69 01/24/2008 2256   CHOLHDL 2.5 Ratio 01/24/2008 2256   VLDL 28 01/24/2008 2256   LDLCALC 78 01/24/2008 2256       Assessment and plan:   Patient Active Problem List   Diagnosis Date Noted  . Ventral hernia 05/19/2013  . DEPRESSION 06/13/2010  . LACERATION, SCALP 01/21/2010  . TOBACCO ABUSE 12/17/2009  . ONYCHOMYCOSIS, TOENAILS 09/20/2009  . RINGWORM 09/20/2009  . DIVERTICULOSIS, COLON 07/21/2009  . SYPHILIS 05/14/2009  . DYSHIDROTIC ECZEMA, HANDS 11/06/2008  . LUMBAR RADICULOPATHY, LEFT 11/06/2008  . FIBROIDS, UTERUS 11/17/2007  . HYPERTENSION, ESSENTIAL 11/17/2007   Hypertension Continue metoprolol and lisinopril Baseline labs  Dyslipidemia Continue TriCor, Lipitor Voltaren   Status post ventral hernia repair Healing well  Low back pain Pain clinic referral Started on Flexeril and tramadol UDS today     Establish care Mammogram  due 12/15 Tetanus vaccination today Flu vaccine last year, next due in 2015 Colonoscopy 2009     The patient was given clear instructions to go to ER or return to medical center if symptoms don't improve, worsen or new problems develop. The patient verbalized understanding. The patient was told to call to get any lab results if not heard anything in the next week.

## 2013-06-14 NOTE — Progress Notes (Signed)
Patient here to establish care Has history of HTN Recently had hernia surgery Would like referral to pain clinic

## 2013-06-15 LAB — TSH: TSH: 0.899 u[IU]/mL (ref 0.350–4.500)

## 2013-06-19 ENCOUNTER — Telehealth: Payer: Self-pay | Admitting: Emergency Medicine

## 2013-06-19 NOTE — Telephone Encounter (Signed)
Message copied by Ricci Barker on Mon Jun 19, 2013 12:08 PM ------      Message from: Allyson Sabal MD, [facility]      Created: Mon Jun 19, 2013 10:13 AM       Notify patient of the labs are normal ------

## 2013-06-19 NOTE — Telephone Encounter (Signed)
Left message for pt to call clinic for results

## 2013-06-21 ENCOUNTER — Other Ambulatory Visit: Payer: Medicare Other

## 2013-06-29 ENCOUNTER — Encounter: Payer: Self-pay | Admitting: Physical Medicine & Rehabilitation

## 2013-07-12 ENCOUNTER — Ambulatory Visit: Payer: Medicare Other | Attending: Internal Medicine

## 2013-08-07 ENCOUNTER — Ambulatory Visit (HOSPITAL_BASED_OUTPATIENT_CLINIC_OR_DEPARTMENT_OTHER): Payer: Medicare Other | Admitting: Physical Medicine & Rehabilitation

## 2013-08-07 ENCOUNTER — Encounter: Payer: Medicare Other | Attending: Physical Medicine & Rehabilitation

## 2013-08-07 ENCOUNTER — Encounter: Payer: Self-pay | Admitting: Physical Medicine & Rehabilitation

## 2013-08-07 VITALS — BP 144/97 | HR 63 | Resp 14 | Ht 66.0 in | Wt 192.0 lb

## 2013-08-07 DIAGNOSIS — M961 Postlaminectomy syndrome, not elsewhere classified: Secondary | ICD-10-CM | POA: Insufficient documentation

## 2013-08-07 DIAGNOSIS — Z79899 Other long term (current) drug therapy: Secondary | ICD-10-CM | POA: Diagnosis not present

## 2013-08-07 DIAGNOSIS — M545 Low back pain, unspecified: Secondary | ICD-10-CM | POA: Insufficient documentation

## 2013-08-07 DIAGNOSIS — G8929 Other chronic pain: Secondary | ICD-10-CM | POA: Diagnosis not present

## 2013-08-07 DIAGNOSIS — M549 Dorsalgia, unspecified: Secondary | ICD-10-CM

## 2013-08-07 DIAGNOSIS — E785 Hyperlipidemia, unspecified: Secondary | ICD-10-CM | POA: Insufficient documentation

## 2013-08-07 DIAGNOSIS — Z5181 Encounter for therapeutic drug level monitoring: Secondary | ICD-10-CM | POA: Diagnosis not present

## 2013-08-07 DIAGNOSIS — I10 Essential (primary) hypertension: Secondary | ICD-10-CM | POA: Diagnosis not present

## 2013-08-07 NOTE — Progress Notes (Signed)
Subjective:    Patient ID: Shannon [name], female    DOB: 1955/08/23, 58 y.o.   MRN: [id number] The patient is a 58-year-   old right-handed black female who has been having difficulties with low   back and bilateral buttock and thigh pain.  It has been worsening over   the past 6 years.  She has been treated with NSAIDs and analgesics   without improvement and is admitted now for definitive intervention.  X-   rays show a grade 1-2 dynamic degenerative spondylosis of L4-L5,   secondary facet arthropathy.  MRI scan shows advanced bilateral L4-L5   facet arthropathy with resulting anterolisthesis at L4-L5 and canal   stenosis at the L4-L5 level.   09/01/2010  L4-5 lumbar decompression including laminotomy, facetectomy,   foraminotomy with decompression of the exiting L4 and L5 nerve roots,   with decompression beyond that required for interbody arthrodesis with   microdissection microsurgical technique, L4-5 post lumbar interbody   arthrodesis with AVS PEEK interbody implants, and Vitoss bone marrow   aspirate and infuse, and a bilateral L4-5 posterolateral arthrodesis   with radius posterior instrumentation and Vitoss bone marrow aspirate   and infuse.  HPI Back surgery help with shooting pains down the legs but continues to have low back pain Was supposed to be scheduled for physical therapy this year but did not have a call back. Patient had spine injections prior to her surgery and 2012 but none since the surgery. Preoperatively in 2011 had L4-L5 interlaminar epidural steroid injections which were temporarily helpful  Has had ventral hernia repair 05/24/2013 No postoperative complications  Was taking tramadol prior to surgery Patient tolerated this medicine Patient cannot use morphine because it causes itching  Pain Inventory Average Pain 8 Pain Right Now 7 My pain is sharp  In the last 24 hours, has pain interfered with the following? General activity 8 Relation with  others 7 Enjoyment of life 9 What TIME of day is your pain at its worst? morning Sleep (in general) Fair  Pain is worse with: walking, sitting, standing and some activites Pain improves with: n/a Relief from Meds: 8  Mobility use a cane ability to climb steps?  yes do you drive?  no Do you have any goals in this area?  no  Function not employed: date last employed . disabled: date disabled 2010 I need assistance with the following:  household duties Do you have any goals in this area?  no  Neuro/Psych trouble walking dizziness  Prior Studies Any changes since last visit?  no  Physicians involved in your care Primary care .   History reviewed. No pertinent family history. History   Social History  . Marital Status: Legally Separated    Spouse Name: N/A    Number of Children: N/A  . Years of Education: N/A   Social History Main Topics  . Smoking status: Never Smoker   . Smokeless tobacco: None  . Alcohol Use: No  . Drug Use: No  . Sexual Activity: Yes    Birth Control/ Protection: Surgical   Other Topics Concern  . None   Social History Narrative  . None   Past Surgical History  Procedure Laterality Date  . Abdominal hysterectomy    . Back surgery    . Fracture surgery      left leg  . Uterine fibroid surgery    . Colonscopy    . Ventral hernia repair N/A 05/24/2013    Procedure:  OPEN REPAIR EPIGASTRIC VENTRAL HERNIA;  Surgeon: Imogene Burn. Georgette Dover, MD;  Location: [facility];  Service: General;  Laterality: N/A;  . Insertion of mesh N/A 05/24/2013    Procedure: INSERTION OF MESH;  Surgeon: Imogene Burn. Georgette Dover, MD;  Location: [facility];  Service: General;  Laterality: N/A;   Past Medical History  Diagnosis Date  . Hypertension     takes Lisinopril and Metoprolol daily  . Hyperlipidemia     takes Tricor daily  . Peripheral edema     takes lasix daily  . Dizziness     occasionally  . Numbness     stinging in right hand  . Ventral hernia    BP 144/97  Pulse  63  Resp 14  Ht 5\' 6"  (1.676 m)  Wt 192 lb (87.091 kg)  BMI 31.00 kg/m2  SpO2 98%  Opioid Risk Score:  0 Fall Risk Score: Low Fall Risk (0-5 points) (patient educated handout declined)   Review of Systems  Constitutional: Positive for diaphoresis and unexpected weight change.  Musculoskeletal: Positive for gait problem.  Neurological: Positive for dizziness.  All other systems reviewed and are negative.      Objective:   Physical Exam  Nursing note and vitals reviewed. Constitutional: She is oriented to person, place, and time. She appears well-developed and well-nourished.  HENT:  Head: Normocephalic and atraumatic.  Eyes: Conjunctivae and EOM are normal. Pupils are equal, round, and reactive to light.  Neck: Normal range of motion.  Musculoskeletal: Normal range of motion.       Cervical back: She exhibits tenderness. She exhibits normal range of motion.       Thoracic back: Normal.       Lumbar back: She exhibits tenderness.  Tenderness over left trapezius  Tenderness at bilateral L4 and L5 paraspinal muscles.  Full lumbar flexion Full lumbar extension Full lumbar lateral bending  Neurological: She is alert and oriented to person, place, and time. She has normal strength. No sensory deficit. Gait normal.  Reflex Scores:      Tricep reflexes are 2+ on the right side and 2+ on the left side.      Bicep reflexes are 2+ on the right side and 2+ on the left side.      Brachioradialis reflexes are 2+ on the right side and 2+ on the left side.      Patellar reflexes are 2+ on the right side and 2+ on the left side.      Achilles reflexes are 2+ on the right side and 2+ on the left side. Psychiatric: She has a normal mood and affect.          Assessment & Plan:  1. Lumbar postlaminectomy syndrome with chronic  lumbar pain , no radicular symptoms We discussed multimodal treatment approach including medication, injection and physical therapy  , Medication standpoint  she has tried tramadol for her chronic pain and states he was not effective, states that Tylenol #3 was not effective. She has allergy to morphine which causes itching. She has used hydrocodone in the past and this seemed to help her pain without causing any side effects. We discussed that we would need to be check a urine drug screen prior to prescribing this type of medication. She understands.  Opioid risk score is 0, low risk  From an injection standpoint Will target L5-S1 facet joints intra-articular injection which would be below the L4-L5 fusion, if this is not helpful Next step is bilateral sacroiliac injections  Will start physical therapy for core stabilization program Assuming urine drug screen is normal patient will need to pick up a prescription for hydrocodone 5 mg 3 times a day #90 RTC 1 month for the injections.

## 2013-08-07 NOTE — Patient Instructions (Signed)
Therapy prescription written  Spinal injection scheduled  Will need urine drug and back prior to any prescriptions for hydrocodone

## 2013-08-15 ENCOUNTER — Other Ambulatory Visit: Payer: Self-pay | Admitting: Physical Medicine & Rehabilitation

## 2013-08-15 NOTE — Progress Notes (Signed)
Urine drug screen from 08/07/2013 was consistent.

## 2013-08-28 ENCOUNTER — Ambulatory Visit: Payer: Medicare Other | Attending: Physical Medicine & Rehabilitation

## 2013-08-28 DIAGNOSIS — M2420 Disorder of ligament, unspecified site: Secondary | ICD-10-CM | POA: Insufficient documentation

## 2013-08-28 DIAGNOSIS — IMO0001 Reserved for inherently not codable concepts without codable children: Secondary | ICD-10-CM | POA: Diagnosis not present

## 2013-08-28 DIAGNOSIS — R030 Elevated blood-pressure reading, without diagnosis of hypertension: Secondary | ICD-10-CM | POA: Diagnosis not present

## 2013-08-28 DIAGNOSIS — M549 Dorsalgia, unspecified: Secondary | ICD-10-CM | POA: Diagnosis not present

## 2013-08-28 DIAGNOSIS — M961 Postlaminectomy syndrome, not elsewhere classified: Secondary | ICD-10-CM | POA: Insufficient documentation

## 2013-08-28 DIAGNOSIS — M629 Disorder of muscle, unspecified: Secondary | ICD-10-CM | POA: Diagnosis not present

## 2013-08-30 ENCOUNTER — Ambulatory Visit: Payer: Medicare Other | Admitting: Rehabilitation

## 2013-08-30 DIAGNOSIS — IMO0001 Reserved for inherently not codable concepts without codable children: Secondary | ICD-10-CM | POA: Diagnosis not present

## 2013-09-06 ENCOUNTER — Ambulatory Visit: Payer: Medicare Other

## 2013-09-06 DIAGNOSIS — IMO0001 Reserved for inherently not codable concepts without codable children: Secondary | ICD-10-CM | POA: Diagnosis not present

## 2013-09-11 ENCOUNTER — Encounter: Payer: Medicare Other | Admitting: Rehabilitation

## 2013-09-12 ENCOUNTER — Encounter: Payer: Medicare Other | Admitting: Rehabilitation

## 2013-09-14 ENCOUNTER — Ambulatory Visit: Payer: Medicare Other | Admitting: Internal Medicine

## 2013-09-14 ENCOUNTER — Ambulatory Visit: Payer: Medicare Other | Admitting: Physical Medicine & Rehabilitation

## 2013-09-21 ENCOUNTER — Ambulatory Visit (HOSPITAL_BASED_OUTPATIENT_CLINIC_OR_DEPARTMENT_OTHER): Payer: Medicare Other | Admitting: Physical Medicine & Rehabilitation

## 2013-09-21 ENCOUNTER — Encounter: Payer: Medicare Other | Attending: Physical Medicine & Rehabilitation

## 2013-09-21 ENCOUNTER — Encounter: Payer: Self-pay | Admitting: Physical Medicine & Rehabilitation

## 2013-09-21 VITALS — BP 148/88 | HR 59 | Resp 14 | Ht 66.0 in | Wt 192.0 lb

## 2013-09-21 DIAGNOSIS — I10 Essential (primary) hypertension: Secondary | ICD-10-CM | POA: Diagnosis not present

## 2013-09-21 DIAGNOSIS — M961 Postlaminectomy syndrome, not elsewhere classified: Secondary | ICD-10-CM

## 2013-09-21 DIAGNOSIS — Z79899 Other long term (current) drug therapy: Secondary | ICD-10-CM | POA: Insufficient documentation

## 2013-09-21 DIAGNOSIS — M549 Dorsalgia, unspecified: Secondary | ICD-10-CM | POA: Diagnosis present

## 2013-09-21 DIAGNOSIS — E785 Hyperlipidemia, unspecified: Secondary | ICD-10-CM | POA: Diagnosis not present

## 2013-09-21 DIAGNOSIS — M47817 Spondylosis without myelopathy or radiculopathy, lumbosacral region: Secondary | ICD-10-CM | POA: Diagnosis not present

## 2013-09-21 DIAGNOSIS — Z5181 Encounter for therapeutic drug level monitoring: Secondary | ICD-10-CM | POA: Diagnosis not present

## 2013-09-21 DIAGNOSIS — M545 Low back pain, unspecified: Secondary | ICD-10-CM | POA: Insufficient documentation

## 2013-09-21 DIAGNOSIS — G8929 Other chronic pain: Secondary | ICD-10-CM | POA: Insufficient documentation

## 2013-09-21 NOTE — Progress Notes (Signed)
Bilateral intra-articular L5-S1 facet injections Indication: Axial lumbar pain below L4-L5 fusion  Pain is only partially responsive medication management and other conservative care and interferes with activities. Informed consent was obtained after describing risks and benefits of the procedure with the patient is include bleeding bruising and infection she elects to proceed and has given written consent. Patient placed prone on fluoroscopy table. Betadine prep sternal drape. 25-gauge inch and a half needle was used to anesthetize skin and subcutaneous tissue with lidocaine 1% x2 cc on each side. Then a 25-gauge 3 inch spinal needle was inserted under fluoroscopic cards targeting the medial junction of the left L5 lamina with the superior sacrum, Omnipaque 0.3 mL injected demonstrating spread along the facet joint, then 1 cc of 2% lidocaine +0.5 mL of Celestone 6 mg per mL were injected. this same procedure was repeated on the right side using same needle injected and technique Preinjection pain 7/10 Postinjection pain 4/10  Patient also requesting oxycodone Urine drug screen showed no evidence of oxycodone despite the patient's claim of taking this medication and  bringing in a pill bottle at initial visit 08/07/2013. I discussed with patient I. do not feel comfortable prescribing oxycodone in this situation. I did offer tramadol which she declined  Return to clinic 6 weeks either to repeat the L5-S1 facet injection vs. trial of sacroiliac injections depending on results

## 2013-09-21 NOTE — Patient Instructions (Signed)
See you back in 6 weeks, will either repeat the same injection or try a different injection.  Since no oxycodone showed up in your urine test I do not feel comfortable prescribing this medicine for you. You may want to go back to the doctor who originally prescribed for you.

## 2013-09-21 NOTE — Progress Notes (Signed)
  [facility]   Name: Shannon [name] DOB:April 17, 1955 MRN: [id number]  Date:09/21/2013  Physician: Alysia Penna, MD    Nurse/CMA: Mikenna Bunkley,CMA/[place],CMA  Allergies:  Allergies  Allergen Reactions  . Morphine And Related     Itch    Consent Signed: yes  Is patient diabetic? no   Pregnant: no LMP: No LMP recorded. Patient has had a hysterectomy. (age 58-55)  Anticoagulants: no Anti-inflammatory: no Antibiotics: no  Procedure: L5-S1 Intra Articular facet bilateral  Position: Prone Start Time: 10:26 End Time: 10:33  Fluoro Time: 34 seconds  RN/CMA Tavaris Eudy,CMA Walston,CMA    Time 1015 10:36    BP 148/88 147/93    Pulse 69 63    Respirations 14 14    O2 Sat 98 98    S/S 6 6    Pain Level 9/10 6/10     D/C home with transportation patient A & O X 3, D/C instructions reviewed, and sits independently.

## 2013-10-11 ENCOUNTER — Encounter: Payer: Self-pay | Admitting: Internal Medicine

## 2013-10-11 ENCOUNTER — Ambulatory Visit: Payer: Medicare Other | Attending: Internal Medicine | Admitting: Internal Medicine

## 2013-10-11 VITALS — BP 135/94 | HR 78 | Temp 98.2°F | Resp 16 | Wt 193.8 lb

## 2013-10-11 DIAGNOSIS — E785 Hyperlipidemia, unspecified: Secondary | ICD-10-CM

## 2013-10-11 DIAGNOSIS — I10 Essential (primary) hypertension: Secondary | ICD-10-CM

## 2013-10-11 LAB — COMPLETE METABOLIC PANEL WITH GFR
ALK PHOS: 53 U/L (ref 39–117)
ALT: 32 U/L (ref 0–35)
AST: 31 U/L (ref 0–37)
Albumin: 4.1 g/dL (ref 3.5–5.2)
BUN: 12 mg/dL (ref 6–23)
CO2: 24 meq/L (ref 19–32)
Calcium: 9.5 mg/dL (ref 8.4–10.5)
Chloride: 111 mEq/L (ref 96–112)
Creat: 1.06 mg/dL (ref 0.50–1.10)
GFR, EST AFRICAN AMERICAN: 68 mL/min
GFR, EST NON AFRICAN AMERICAN: 59 mL/min — AB
GLUCOSE: 76 mg/dL (ref 70–99)
Potassium: 4.4 mEq/L (ref 3.5–5.3)
SODIUM: 144 meq/L (ref 135–145)
TOTAL PROTEIN: 8 g/dL (ref 6.0–8.3)
Total Bilirubin: 0.3 mg/dL (ref 0.2–1.2)

## 2013-10-11 NOTE — Patient Instructions (Signed)
DASH Eating Plan  DASH stands for "Dietary Approaches to Stop Hypertension." The DASH eating plan is a healthy eating plan that has been shown to reduce high blood pressure (hypertension). Additional health benefits may include reducing the risk of type 2 diabetes mellitus, heart disease, and stroke. The DASH eating plan may also help with weight loss.  WHAT DO I NEED TO KNOW ABOUT THE DASH EATING PLAN?  For the DASH eating plan, you will follow these general guidelines:  · Choose foods with a percent daily value for sodium of less than 5% (as listed on the food label).  · Use salt-free seasonings or herbs instead of table salt or sea salt.  · Check with your health care provider or pharmacist before using salt substitutes.  · Eat lower-sodium products, often labeled as "lower sodium" or "no salt added."  · Eat fresh foods.  · Eat more vegetables, fruits, and low-fat dairy products.  · Choose whole grains. Look for the word "whole" as the first word in the ingredient list.  · Choose fish and skinless chicken or turkey more often than red meat. Limit fish, poultry, and meat to 6 oz (170 g) each day.  · Limit sweets, desserts, sugars, and sugary drinks.  · Choose heart-healthy fats.  · Limit cheese to 1 oz (28 g) per day.  · Eat more home-cooked food and less restaurant, buffet, and fast food.  · Limit fried foods.  · Cook foods using methods other than frying.  · Limit canned vegetables. If you do use them, rinse them well to decrease the sodium.  · When eating at a restaurant, ask that your food be prepared with less salt, or no salt if possible.  WHAT FOODS CAN I EAT?  Seek help from a dietitian for individual calorie needs.  Grains  Whole grain or whole wheat bread. Brown rice. Whole grain or whole wheat pasta. Quinoa, bulgur, and whole grain cereals. Low-sodium cereals. Corn or whole wheat flour tortillas. Whole grain cornbread. Whole grain crackers. Low-sodium crackers.  Vegetables  Fresh or frozen vegetables  (raw, steamed, roasted, or grilled). Low-sodium or reduced-sodium tomato and vegetable juices. Low-sodium or reduced-sodium tomato sauce and paste. Low-sodium or reduced-sodium canned vegetables.   Fruits  All fresh, canned (in natural juice), or frozen fruits.  Meat and Other Protein Products  Ground beef (85% or leaner), grass-fed beef, or beef trimmed of fat. Skinless chicken or turkey. Ground chicken or turkey. Pork trimmed of fat. All fish and seafood. Eggs. Dried beans, peas, or lentils. Unsalted nuts and seeds. Unsalted canned beans.  Dairy  Low-fat dairy products, such as skim or 1% milk, 2% or reduced-fat cheeses, low-fat ricotta or cottage cheese, or plain low-fat yogurt. Low-sodium or reduced-sodium cheeses.  Fats and Oils  Tub margarines without trans fats. Light or reduced-fat mayonnaise and salad dressings (reduced sodium). Avocado. Safflower, olive, or canola oils. Natural peanut or almond butter.  Other  Unsalted popcorn and pretzels.  The items listed above may not be a complete list of recommended foods or beverages. Contact your dietitian for more options.  WHAT FOODS ARE NOT RECOMMENDED?  Grains  White bread. White pasta. White rice. Refined cornbread. Bagels and croissants. Crackers that contain trans fat.  Vegetables  Creamed or fried vegetables. Vegetables in a cheese sauce. Regular canned vegetables. Regular canned tomato sauce and paste. Regular tomato and vegetable juices.  Fruits  Dried fruits. Canned fruit in light or heavy syrup. Fruit juice.  Meat and Other Protein   Products  Fatty cuts of meat. Ribs, chicken wings, bacon, sausage, bologna, salami, chitterlings, fatback, hot dogs, bratwurst, and packaged luncheon meats. Salted nuts and seeds. Canned beans with salt.  Dairy  Whole or 2% milk, cream, half-and-half, and cream cheese. Whole-fat or sweetened yogurt. Full-fat cheeses or blue cheese. Nondairy creamers and whipped toppings. Processed cheese, cheese spreads, or cheese  curds.  Condiments  Onion and garlic salt, seasoned salt, table salt, and sea salt. Canned and packaged gravies. Worcestershire sauce. Tartar sauce. Barbecue sauce. Teriyaki sauce. Soy sauce, including reduced sodium. Steak sauce. Fish sauce. Oyster sauce. Cocktail sauce. Horseradish. Ketchup and mustard. Meat flavorings and tenderizers. Bouillon cubes. Hot sauce. Tabasco sauce. Marinades. Taco seasonings. Relishes.  Fats and Oils  Butter, stick margarine, lard, shortening, ghee, and bacon fat. Coconut, palm kernel, or palm oils. Regular salad dressings.  Other  Pickles and olives. Salted popcorn and pretzels.  The items listed above may not be a complete list of foods and beverages to avoid. Contact your dietitian for more information.  WHERE CAN I FIND MORE INFORMATION?  National Heart, Lung, and [employer] Institute: www.nhlbi.nih.gov/health/health-topics/topics/dash/  Document Released: 03/19/2011 Document Revised: 04/04/2013 Document Reviewed: 02/01/2013  ExitCare® Patient Information ©2015 [employer]. This information is not intended to replace advice given to you by your health care provider. Make sure you discuss any questions you have with your health care provider.

## 2013-10-11 NOTE — Progress Notes (Signed)
MRN: [id number] Name: Shannon [name]  Sex: female Age: 58 y.o. DOB: 1955/07/21  Allergies: Morphine and related  Chief Complaint  Patient presents with  . Follow-up    HPI: Patient is 58 y.o. female who has history of hypertension, hyperlipidemia comes today for followup, she denies any acute symptoms, as per patient she is compliant with taking her medications, as per patient she does not smoke cigarettes. Patient denies any headache dizziness chest and shortness of breath, she is up-to-date with mammogram and colonoscopy.  Past Medical History  Diagnosis Date  . Hypertension     takes Lisinopril and Metoprolol daily  . Hyperlipidemia     takes Tricor daily  . Peripheral edema     takes lasix daily  . Dizziness     occasionally  . Numbness     stinging in right hand  . Ventral hernia     Past Surgical History  Procedure Laterality Date  . Abdominal hysterectomy    . Back surgery    . Fracture surgery      left leg  . Uterine fibroid surgery    . Colonscopy    . Ventral hernia repair N/A 05/24/2013    Procedure: OPEN REPAIR EPIGASTRIC VENTRAL HERNIA;  Surgeon: Imogene Burn. Georgette Dover, MD;  Location: [facility];  Service: General;  Laterality: N/A;  . Insertion of mesh N/A 05/24/2013    Procedure: INSERTION OF MESH;  Surgeon: Imogene Burn. Georgette Dover, MD;  Location: [facility];  Service: General;  Laterality: N/A;      Medication List       This list is accurate as of: 10/11/13 10:40 AM.  Always use your most recent med list.               cyclobenzaprine 10 MG tablet  Commonly known as:  FLEXERIL  Take 1 tablet (10 mg total) by mouth at bedtime.     fenofibrate 48 MG tablet  Commonly known as:  TRICOR  Take 1 tablet (48 mg total) by mouth daily.     furosemide 20 MG tablet  Commonly known as:  LASIX  Take 1 tablet (20 mg total) by mouth daily.     lisinopril 20 MG tablet  Commonly known as:  PRINIVIL,ZESTRIL  Take 1 tablet (20 mg total) by mouth daily.     meloxicam  7.5 MG tablet  Commonly known as:  MOBIC  Take 7.5 mg by mouth daily.     metoprolol succinate 100 MG 24 hr tablet  Commonly known as:  TOPROL-XL  Take 1 tablet (100 mg total) by mouth daily. Take with or immediately following a meal.        No orders of the defined types were placed in this encounter.    Immunization History  Administered Date(s) Administered  . Influenza Whole 04/26/2009, 01/21/2010  . Td 11/17/2007    History reviewed. No pertinent family history.  History  Substance Use Topics  . Smoking status: Never Smoker   . Smokeless tobacco: Not on file  . Alcohol Use: No    Review of Systems   As noted in HPI  Filed Vitals:   10/11/13 1004  BP: 135/94  Pulse: 78  Temp: 98.2 F (36.8 C)  Resp: 16    Physical Exam  Physical Exam  Constitutional: No distress.  Eyes: EOM are normal. Pupils are equal, round, and reactive to light.  Cardiovascular: Normal rate and regular rhythm.   Pulmonary/Chest: Breath sounds normal. No respiratory distress.  She has no wheezes. She has no rales.  Musculoskeletal: She exhibits no edema.    CBC    Component Value Date/Time   WBC 4.6 06/14/2013 1422   RBC 5.92* 06/14/2013 1422   HGB 14.1 06/14/2013 1422   HCT 43.4 06/14/2013 1422   PLT 305 06/14/2013 1422   MCV 73.3* 06/14/2013 1422   LYMPHSABS 1.9 06/14/2013 1422   MONOABS 0.3 06/14/2013 1422   EOSABS 0.2 06/14/2013 1422   BASOSABS 0.0 06/14/2013 1422    CMP     Component Value Date/Time   NA 140 06/14/2013 1422   K 4.7 06/14/2013 1422   CL 105 06/14/2013 1422   CO2 24 06/14/2013 1422   GLUCOSE 83 06/14/2013 1422   BUN 17 06/14/2013 1422   CREATININE 0.90 06/14/2013 1422   CREATININE 1.22* 05/22/2013 1512   CALCIUM 9.9 06/14/2013 1422   PROT 8.3 06/14/2013 1422   ALBUMIN 4.4 06/14/2013 1422   AST 37 06/14/2013 1422   ALT 31 06/14/2013 1422   ALKPHOS 63 06/14/2013 1422   BILITOT 0.6 06/14/2013 1422   GFRNONAA 72 06/14/2013 1422   GFRNONAA 49* 05/22/2013 1512   GFRAA 83 06/14/2013 1422   GFRAA  57* 05/22/2013 1512    Lab Results  Component Value Date/Time   CHOL 197 06/14/2013  2:22 PM    No components found with this basename: hga1c    Lab Results  Component Value Date/Time   AST 37 06/14/2013  2:22 PM    Assessment and Plan  Essential hypertension, benign - Plan: Blood pressure is well controlled continue with her Lasix lisinopril metoprolol, will repeat blood chemistry COMPLETE METABOLIC PANEL WITH GFR  Dyslipidemia Continue with low-fat diet and TriCor.  Health Maintenance -Colonoscopy: uptodate  -Pap Smear: pt is s/p hystrectomy -Mammogram:uptodate    Return in about 4 months (around 02/11/2014).  Lorayne Marek, MD

## 2013-10-11 NOTE — Progress Notes (Signed)
Patient here for follow up on her HTN

## 2013-10-12 ENCOUNTER — Telehealth: Payer: Self-pay

## 2013-10-12 NOTE — Telephone Encounter (Signed)
Message copied by Dorothe Pea on Thu Oct 12, 2013 11:20 AM ------      Message from: Shannon [name]      Created: Thu Oct 12, 2013 11:09 AM       Call and let the Patient know that blood work is normal.       ------

## 2013-10-12 NOTE — Telephone Encounter (Signed)
Patient not available Left message on voice mail that her results were normal

## 2013-11-09 ENCOUNTER — Ambulatory Visit (HOSPITAL_BASED_OUTPATIENT_CLINIC_OR_DEPARTMENT_OTHER): Payer: Medicare Other | Admitting: Physical Medicine & Rehabilitation

## 2013-11-09 ENCOUNTER — Encounter: Payer: Medicare Other | Attending: Physical Medicine & Rehabilitation

## 2013-11-09 ENCOUNTER — Encounter: Payer: Self-pay | Admitting: Physical Medicine & Rehabilitation

## 2013-11-09 VITALS — BP 165/101 | HR 56 | Resp 14 | Ht 66.0 in | Wt 192.0 lb

## 2013-11-09 DIAGNOSIS — I10 Essential (primary) hypertension: Secondary | ICD-10-CM | POA: Diagnosis not present

## 2013-11-09 DIAGNOSIS — Z5181 Encounter for therapeutic drug level monitoring: Secondary | ICD-10-CM | POA: Insufficient documentation

## 2013-11-09 DIAGNOSIS — M961 Postlaminectomy syndrome, not elsewhere classified: Secondary | ICD-10-CM | POA: Insufficient documentation

## 2013-11-09 DIAGNOSIS — M545 Low back pain, unspecified: Secondary | ICD-10-CM | POA: Diagnosis not present

## 2013-11-09 DIAGNOSIS — M47817 Spondylosis without myelopathy or radiculopathy, lumbosacral region: Secondary | ICD-10-CM | POA: Diagnosis not present

## 2013-11-09 DIAGNOSIS — G8929 Other chronic pain: Secondary | ICD-10-CM | POA: Diagnosis not present

## 2013-11-09 DIAGNOSIS — E785 Hyperlipidemia, unspecified: Secondary | ICD-10-CM | POA: Diagnosis not present

## 2013-11-09 DIAGNOSIS — Z79899 Other long term (current) drug therapy: Secondary | ICD-10-CM | POA: Diagnosis not present

## 2013-11-09 DIAGNOSIS — M549 Dorsalgia, unspecified: Secondary | ICD-10-CM | POA: Diagnosis present

## 2013-11-09 MED ORDER — OXYCODONE HCL 5 MG PO TABS: 5.0000 mg | ORAL_TABLET | Freq: Three times a day (TID) | ORAL | Status: DC | PRN

## 2013-11-09 NOTE — Progress Notes (Signed)
  [facility]   Name: [name] DOB:03-Aug-1955 MRN: [id number]  Date:11/09/2013  Physician: Alysia Penna, MD    Nurse/CMA: Cambridge Deleo,CMA/Shumaker, RN  Allergies:  Allergies  Allergen Reactions  . Morphine And Related     Itch    Consent Signed: Yes.    Is patient diabetic? No.    Pregnant: No. LMP: No LMP recorded. Patient has had a hysterectomy. (age [age])  Anticoagulants: no Anti-inflammatory: no Antibiotics: no  Procedure:   Position: Prone Start Time:   End Time:   Fluoro Time:   RN/CMA Katalena Malveaux,CMA Shumaker,RN    Time 925     BP 165/101     Pulse 56     Respirations 14     O2 Sat 99     S/S 6     Pain Level 6/10      D/C home with transportation, patient A & O X 3, D/C instructions reviewed, and sits independently.

## 2013-11-09 NOTE — Patient Instructions (Signed)
Part of your pain is coming from the L5-S1 joints. This is below the fusion. He may get some short term relief with injections. If the medication is not helpful , may consider surgery at that level.

## 2013-11-09 NOTE — Progress Notes (Signed)
Patient scheduled for injections today. The last injection was performed 09/21/2013. L5-S1 facet joint. Past medical history significant for L4-L5 fusion.  Has tried tramadol which she states was not helpful. Try Tylenol 3 in the past which is not helpful. Allergy to morphine. Has tried oxycodone in the past which she states is helpful. Also tried hydrocodone the past which was helpful.  Opioid risk score is 0 The urine screen showed no there is no evidence of oxycodone at that time. Patient states that she had run out before the urine drug screen. Hasn't taken them for several days  Please see vital signs as well as nursing note period General no acute distress Mood and affect are appropriate Back has decreased range of motion Tenderness to palpation L5-S1 as well as PSIS area. Hip range of motion is full and pain-free Straight leg raising test is negative Motor strength is 5/5 bilateral hip flexor knee extensor ankle dorsiflexors bilateral  Impression 1. Lumbar post laminectomy syndrome with chronic postoperative pain. She had short term relief with L5-S1 facet injection which indicates this may be a pain generator. Unfortunately there is no longer term procedure other than a fusion at that level that may be helpful. Will trial oxycodone 5 mg 3 times per day Nurse practitioner visit one month with repeat urine drug screen

## 2013-11-16 ENCOUNTER — Encounter: Payer: Self-pay | Admitting: Physical Medicine & Rehabilitation

## 2013-11-16 ENCOUNTER — Other Ambulatory Visit: Payer: Self-pay | Admitting: Physical Medicine & Rehabilitation

## 2013-11-17 ENCOUNTER — Other Ambulatory Visit: Payer: Self-pay | Admitting: Physical Medicine & Rehabilitation

## 2013-11-28 ENCOUNTER — Other Ambulatory Visit: Payer: Self-pay | Admitting: *Deleted

## 2013-11-28 MED ORDER — CYCLOBENZAPRINE HCL 10 MG PO TABS: 10.0000 mg | ORAL_TABLET | Freq: Every day | ORAL | Status: DC

## 2013-12-04 ENCOUNTER — Other Ambulatory Visit: Payer: Self-pay | Admitting: *Deleted

## 2013-12-04 MED ORDER — CYCLOBENZAPRINE HCL 10 MG PO TABS: 10.0000 mg | ORAL_TABLET | Freq: Every day | ORAL | Status: DC

## 2013-12-07 ENCOUNTER — Encounter: Payer: Self-pay | Admitting: Registered Nurse

## 2013-12-07 ENCOUNTER — Encounter: Payer: Medicare Other | Attending: Physical Medicine & Rehabilitation | Admitting: Registered Nurse

## 2013-12-07 VITALS — BP 138/95 | HR 75 | Resp 14 | Wt 193.2 lb

## 2013-12-07 DIAGNOSIS — Z79899 Other long term (current) drug therapy: Secondary | ICD-10-CM | POA: Diagnosis not present

## 2013-12-07 DIAGNOSIS — Z5181 Encounter for therapeutic drug level monitoring: Secondary | ICD-10-CM | POA: Diagnosis present

## 2013-12-07 DIAGNOSIS — M549 Dorsalgia, unspecified: Secondary | ICD-10-CM | POA: Diagnosis not present

## 2013-12-07 DIAGNOSIS — M47817 Spondylosis without myelopathy or radiculopathy, lumbosacral region: Secondary | ICD-10-CM | POA: Diagnosis not present

## 2013-12-07 DIAGNOSIS — M961 Postlaminectomy syndrome, not elsewhere classified: Secondary | ICD-10-CM | POA: Diagnosis not present

## 2013-12-07 DIAGNOSIS — G8929 Other chronic pain: Secondary | ICD-10-CM | POA: Diagnosis not present

## 2013-12-07 MED ORDER — OXYCODONE HCL 5 MG PO TABS: 5.0000 mg | ORAL_TABLET | Freq: Three times a day (TID) | ORAL | Status: DC | PRN

## 2013-12-07 NOTE — Progress Notes (Signed)
Subjective:    Patient ID: Shannon [name], female    DOB: 01/01/1956, 58 y.o.   MRN: [id number]  HPI: Shannon [name] is a 58 year old female who returns for follow up for chronic pain and medication refill. She says her pain is located in her lower back. She rates her pain 4. Her current exercise regime is performing stretching exercises, sit-ups,and jumping-jacks.   Pain Inventory Average Pain 4 Pain Right Now 4 My pain is aching  In the last 24 hours, has pain interfered with the following? General activity 4 Relation with others 4 Enjoyment of life 4 What TIME of day is your pain at its worst? evening Sleep (in general) Fair  Pain is worse with: walking, bending and standing Pain improves with: medication Relief from Meds: 8  Mobility use a cane ability to climb steps?  yes do you drive?  no  Function disabled: date disabled 2010 I need assistance with the following:  household duties and shopping  Neuro/Psych trouble walking  Prior Studies Any changes since last visit?  no  Physicians involved in your care Any changes since last visit?  no   History reviewed. No pertinent family history. History   Social History  . Marital Status: Legally Separated    Spouse Name: N/A    Number of Children: N/A  . Years of Education: N/A   Social History Main Topics  . Smoking status: Never Smoker   . Smokeless tobacco: None  . Alcohol Use: No  . Drug Use: No  . Sexual Activity: Yes    Birth Control/ Protection: Surgical   Other Topics Concern  . None   Social History Narrative   ** Merged History Encounter **       Past Surgical History  Procedure Laterality Date  . Abdominal hysterectomy    . Back surgery    . Fracture surgery      left leg  . Uterine fibroid surgery    . Colonscopy    . Ventral hernia repair N/A 05/24/2013    Procedure: OPEN REPAIR EPIGASTRIC VENTRAL HERNIA;  Surgeon: Imogene Burn. Georgette Dover, MD;  Location: [facility];  Service:  General;  Laterality: N/A;  . Insertion of mesh N/A 05/24/2013    Procedure: INSERTION OF MESH;  Surgeon: Imogene Burn. Georgette Dover, MD;  Location: [facility];  Service: General;  Laterality: N/A;   Past Medical History  Diagnosis Date  . Hypertension     takes Lisinopril and Metoprolol daily  . Hyperlipidemia     takes Tricor daily  . Peripheral edema     takes lasix daily  . Dizziness     occasionally  . Numbness     stinging in right hand  . Ventral hernia    BP 138/95  Pulse 75  Resp 14  Wt 193 lb 3.2 oz (87.635 kg)  SpO2 96%  Opioid Risk Score:   Fall Risk Score: Low Fall Risk (0-5 points) (previoulsy educated and declined handout) Review of Systems  Constitutional: Positive for diaphoresis.  Gastrointestinal: Positive for diarrhea.  All other systems reviewed and are negative.      Objective:   Physical Exam  Nursing note and vitals reviewed. Constitutional: She is oriented to person, place, and time. She appears well-developed and well-nourished.  HENT:  Head: Normocephalic and atraumatic.  Neck: Normal range of motion. Neck supple.  Cardiovascular: Normal rate and regular rhythm.   Pulmonary/Chest: Effort normal and breath sounds normal.  Musculoskeletal:  Normal  Muscle Bulk and Muscle Testing Reveals: Upper Extremities: Full ROM and Muscle strength 5/5 Spinal Forward Flexion: 90 Degrees and Extension 20 Degrees Lumbar Paraspinal Tenderness: L-3- L-5 Lower Extremities: Full ROM and Muscle strength 5/5 Left Leg Flexion Produces Pain into lower Extremity Arises from chair with ease Narrow Based gait  Neurological: She is alert and oriented to person, place, and time.  Skin: Skin is warm and dry.  Psychiatric: She has a normal mood and affect.          Assessment & Plan:  1. Lumbar postlaminectomy syndrome with chronic lumbar pain: Refilled: Oxycodone IR 5 mg one tablet every 8 hours as needed #90.  15 minutes of face to face patient care time was spent during  this visit all questions were encouraged and answered.  F/U in 1 month

## 2013-12-08 DIAGNOSIS — G8929 Other chronic pain: Secondary | ICD-10-CM | POA: Diagnosis not present

## 2013-12-08 DIAGNOSIS — Z5181 Encounter for therapeutic drug level monitoring: Secondary | ICD-10-CM | POA: Diagnosis not present

## 2013-12-08 DIAGNOSIS — Z79899 Other long term (current) drug therapy: Secondary | ICD-10-CM | POA: Diagnosis not present

## 2013-12-08 DIAGNOSIS — M961 Postlaminectomy syndrome, not elsewhere classified: Secondary | ICD-10-CM | POA: Diagnosis not present

## 2013-12-08 DIAGNOSIS — M47817 Spondylosis without myelopathy or radiculopathy, lumbosacral region: Secondary | ICD-10-CM | POA: Diagnosis not present

## 2013-12-08 DIAGNOSIS — M549 Dorsalgia, unspecified: Secondary | ICD-10-CM | POA: Diagnosis not present

## 2014-01-04 ENCOUNTER — Encounter: Payer: Self-pay | Admitting: Registered Nurse

## 2014-01-04 ENCOUNTER — Encounter: Payer: Medicare Other | Attending: Physical Medicine & Rehabilitation | Admitting: Registered Nurse

## 2014-01-04 VITALS — BP 155/83 | HR 94 | Resp 14 | Ht 65.0 in | Wt 192.2 lb

## 2014-01-04 DIAGNOSIS — Z79899 Other long term (current) drug therapy: Secondary | ICD-10-CM | POA: Diagnosis not present

## 2014-01-04 DIAGNOSIS — M961 Postlaminectomy syndrome, not elsewhere classified: Secondary | ICD-10-CM | POA: Diagnosis not present

## 2014-01-04 DIAGNOSIS — Z5181 Encounter for therapeutic drug level monitoring: Secondary | ICD-10-CM | POA: Diagnosis not present

## 2014-01-04 DIAGNOSIS — M47817 Spondylosis without myelopathy or radiculopathy, lumbosacral region: Secondary | ICD-10-CM

## 2014-01-04 DIAGNOSIS — G8929 Other chronic pain: Secondary | ICD-10-CM

## 2014-01-04 DIAGNOSIS — M549 Dorsalgia, unspecified: Secondary | ICD-10-CM

## 2014-01-04 MED ORDER — OXYCODONE HCL 5 MG PO TABS: 5.0000 mg | ORAL_TABLET | Freq: Three times a day (TID) | ORAL | Status: DC | PRN

## 2014-01-04 NOTE — Addendum Note (Signed)
Addended by: Valeria Batman on: 01/04/2014 11:23 AM   Modules accepted: Orders

## 2014-01-04 NOTE — Progress Notes (Signed)
Subjective:    Patient ID: Shannon [name], female    DOB: 1955/04/23, 58 y.o.   MRN: [id number]  HPI: Ms. Shannon [name] is a 58 year old female who returns for follow up for chronic pain and medication refill. She says her pain is located in her lower back. She rates her pain 5. Her current exercise regime is performing stretching exercises, sit-ups,and jumping-jacks. She is walking twice a week.  Pain Inventory Average Pain 6 Pain Right Now 5 My pain is constant, sharp and stabbing  In the last 24 hours, has pain interfered with the following? General activity 6 Relation with others 6 Enjoyment of life 5 What TIME of day is your pain at its worst? evening Sleep (in general) Fair  Pain is worse with: walking, bending, standing and some activites Pain improves with: heat/ice and medication Relief from Meds: 9  Mobility walk without assistance  Function disabled: date disabled 2010  Neuro/Psych trouble walking  Prior Studies Any changes since last visit?  no  Physicians involved in your care Any changes since last visit?  no   No family history on file. History   Social History  . Marital Status: Legally Separated    Spouse Name: N/A    Number of Children: N/A  . Years of Education: N/A   Social History Main Topics  . Smoking status: Never Smoker   . Smokeless tobacco: Not on file  . Alcohol Use: No  . Drug Use: No  . Sexual Activity: Yes    Birth Control/ Protection: Surgical   Other Topics Concern  . Not on file   Social History Narrative   ** Merged History Encounter **       Past Surgical History  Procedure Laterality Date  . Abdominal hysterectomy    . Back surgery    . Fracture surgery      left leg  . Uterine fibroid surgery    . Colonscopy    . Ventral hernia repair N/A 05/24/2013    Procedure: OPEN REPAIR EPIGASTRIC VENTRAL HERNIA;  Surgeon: Imogene Burn. Georgette Dover, MD;  Location: [facility];  Service: General;  Laterality: N/A;  .  Insertion of mesh N/A 05/24/2013    Procedure: INSERTION OF MESH;  Surgeon: Imogene Burn. Georgette Dover, MD;  Location: [facility];  Service: General;  Laterality: N/A;   Past Medical History  Diagnosis Date  . Hypertension     takes Lisinopril and Metoprolol daily  . Hyperlipidemia     takes Tricor daily  . Peripheral edema     takes lasix daily  . Dizziness     occasionally  . Numbness     stinging in right hand  . Ventral hernia    BP 155/83  Pulse 94  Resp 14  Ht 5\' 5"  (1.651 m)  Wt 192 lb 3.2 oz (87.181 kg)  BMI 31.98 kg/m2  SpO2 97%  Opioid Risk Score:   Fall Risk Score: Low Fall Risk (0-5 points)   Review of Systems     Objective:   Physical Exam  Nursing note and vitals reviewed. Constitutional: She is oriented to person, place, and time. She appears well-developed and well-nourished.  HENT:  Head: Normocephalic and atraumatic.  Neck: Normal range of motion. Neck supple.  Cardiovascular: Normal rate and regular rhythm.   Pulmonary/Chest: Effort normal and breath sounds normal.  Musculoskeletal:  Normal Muscle Bulk and Muscle testing Reveals: Upper Extremities: Full ROM and Muscle Strength 5/5 Spinal Forward Flexion 80 Degrees  and Extension 20 Degrees Lumbar Paraspinal Tenderness: L-3- L-5 Lower Extremities: Full ROM and Muscle strength 5/5 Arises from chair with ease Narrow based gait   Neurological: She is alert and oriented to person, place, and time.  Skin: Skin is warm and dry.  Psychiatric: She has a normal mood and affect.          Assessment & Plan:  1. Lumbar postlaminectomy syndrome with chronic lumbar pain:  Refilled: Oxycodone IR 5 mg one tablet every 8 hours as needed #90.  15 minutes of face to face patient care time was spent during this visit all questions were encouraged and answered.   F/U in 1 month

## 2014-01-22 ENCOUNTER — Encounter: Payer: Self-pay | Admitting: Gastroenterology

## 2014-01-31 ENCOUNTER — Other Ambulatory Visit: Payer: Self-pay | Admitting: *Deleted

## 2014-01-31 MED ORDER — OXYCODONE HCL 5 MG PO TABS: 5.0000 mg | ORAL_TABLET | Freq: Three times a day (TID) | ORAL | Status: DC | PRN

## 2014-01-31 NOTE — Telephone Encounter (Signed)
Narcotic rx printed for MD to sign for RN med refill visit 

## 2014-02-01 ENCOUNTER — Encounter: Payer: Medicare Other | Admitting: Registered Nurse

## 2014-02-01 ENCOUNTER — Encounter: Payer: Medicare Other | Attending: Physical Medicine & Rehabilitation | Admitting: *Deleted

## 2014-02-01 VITALS — BP 144/80 | HR 73 | Resp 14 | Wt 193.2 lb

## 2014-02-01 DIAGNOSIS — G8929 Other chronic pain: Secondary | ICD-10-CM | POA: Insufficient documentation

## 2014-02-01 DIAGNOSIS — M549 Dorsalgia, unspecified: Secondary | ICD-10-CM | POA: Diagnosis not present

## 2014-02-01 DIAGNOSIS — M961 Postlaminectomy syndrome, not elsewhere classified: Secondary | ICD-10-CM | POA: Insufficient documentation

## 2014-02-01 DIAGNOSIS — Z760 Encounter for issue of repeat prescription: Secondary | ICD-10-CM | POA: Diagnosis not present

## 2014-02-01 DIAGNOSIS — Z79891 Long term (current) use of opiate analgesic: Secondary | ICD-10-CM | POA: Diagnosis not present

## 2014-02-01 DIAGNOSIS — Z5181 Encounter for therapeutic drug level monitoring: Secondary | ICD-10-CM | POA: Diagnosis present

## 2014-02-01 NOTE — Progress Notes (Signed)
Here for pill count and medication refills. Oxycodone 5 mg #90   Fill date 01/04/14    Today NV#12  appropriate   VSS    Pain level:7.  RX given and return in one month for follow up with NP

## 2014-02-05 NOTE — Progress Notes (Signed)
Patient ID: Shannon [name], female   DOB: 14-Oct-1955, 58 y.o.   MRN: [id number]

## 2014-02-16 ENCOUNTER — Encounter: Payer: Self-pay | Admitting: Internal Medicine

## 2014-02-16 ENCOUNTER — Ambulatory Visit (HOSPITAL_BASED_OUTPATIENT_CLINIC_OR_DEPARTMENT_OTHER): Payer: Medicare Other

## 2014-02-16 ENCOUNTER — Ambulatory Visit: Payer: Medicare Other | Attending: Internal Medicine | Admitting: Internal Medicine

## 2014-02-16 VITALS — BP 130/90 | HR 90 | Temp 98.0°F | Resp 16 | Wt 189.6 lb

## 2014-02-16 DIAGNOSIS — E785 Hyperlipidemia, unspecified: Secondary | ICD-10-CM | POA: Diagnosis not present

## 2014-02-16 DIAGNOSIS — Z23 Encounter for immunization: Secondary | ICD-10-CM

## 2014-02-16 DIAGNOSIS — I10 Essential (primary) hypertension: Secondary | ICD-10-CM | POA: Diagnosis not present

## 2014-02-16 LAB — COMPLETE METABOLIC PANEL WITH GFR
ALBUMIN: 4.1 g/dL (ref 3.5–5.2)
ALT: 21 U/L (ref 0–35)
AST: 25 U/L (ref 0–37)
Alkaline Phosphatase: 63 U/L (ref 39–117)
BILIRUBIN TOTAL: 0.6 mg/dL (ref 0.2–1.2)
BUN: 17 mg/dL (ref 6–23)
CO2: 23 mEq/L (ref 19–32)
Calcium: 10.1 mg/dL (ref 8.4–10.5)
Chloride: 105 mEq/L (ref 96–112)
Creat: 1.2 mg/dL — ABNORMAL HIGH (ref 0.50–1.10)
GFR, EST NON AFRICAN AMERICAN: 51 mL/min — AB
GFR, Est African American: 58 mL/min — ABNORMAL LOW
Glucose, Bld: 93 mg/dL (ref 70–99)
POTASSIUM: 6 meq/L — AB (ref 3.5–5.3)
SODIUM: 141 meq/L (ref 135–145)
TOTAL PROTEIN: 8.2 g/dL (ref 6.0–8.3)

## 2014-02-16 NOTE — Patient Instructions (Signed)
DASH Eating Plan °DASH stands for "Dietary Approaches to Stop Hypertension." The DASH eating plan is a healthy eating plan that has been shown to reduce high blood pressure (hypertension). Additional health benefits may include reducing the risk of type 2 diabetes mellitus, heart disease, and stroke. The DASH eating plan may also help with weight loss. °WHAT DO I NEED TO KNOW ABOUT THE DASH EATING PLAN? °For the DASH eating plan, you will follow these general guidelines: °· Choose foods with a percent daily value for sodium of less than 5% (as listed on the food label). °· Use salt-free seasonings or herbs instead of table salt or sea salt. °· Check with your health care provider or pharmacist before using salt substitutes. °· Eat lower-sodium products, often labeled as "lower sodium" or "no salt added." °· Eat fresh foods. °· Eat more vegetables, fruits, and low-fat dairy products. °· Choose whole grains. Look for the word "whole" as the first word in the ingredient list. °· Choose fish and skinless chicken or turkey more often than red meat. Limit fish, poultry, and meat to 6 oz (170 g) each day. °· Limit sweets, desserts, sugars, and sugary drinks. °· Choose heart-healthy fats. °· Limit cheese to 1 oz (28 g) per day. °· Eat more home-cooked food and less restaurant, buffet, and fast food. °· Limit fried foods. °· Cook foods using methods other than frying. °· Limit canned vegetables. If you do use them, rinse them well to decrease the sodium. °· When eating at a restaurant, ask that your food be prepared with less salt, or no salt if possible. °WHAT FOODS CAN I EAT? °Seek help from a dietitian for individual calorie needs. °Grains °Whole grain or whole wheat bread. Brown rice. Whole grain or whole wheat pasta. Quinoa, bulgur, and whole grain cereals. Low-sodium cereals. Corn or whole wheat flour tortillas. Whole grain cornbread. Whole grain crackers. Low-sodium crackers. °Vegetables °Fresh or frozen vegetables  (raw, steamed, roasted, or grilled). Low-sodium or reduced-sodium tomato and vegetable juices. Low-sodium or reduced-sodium tomato sauce and paste. Low-sodium or reduced-sodium canned vegetables.  °Fruits °All fresh, canned (in natural juice), or frozen fruits. °Meat and Other Protein Products °Ground beef (85% or leaner), grass-fed beef, or beef trimmed of fat. Skinless chicken or turkey. Ground chicken or turkey. Pork trimmed of fat. All fish and seafood. Eggs. Dried beans, peas, or lentils. Unsalted nuts and seeds. Unsalted canned beans. °Dairy °Low-fat dairy products, such as skim or 1% milk, 2% or reduced-fat cheeses, low-fat ricotta or cottage cheese, or plain low-fat yogurt. Low-sodium or reduced-sodium cheeses. °Fats and Oils °Tub margarines without trans fats. Light or reduced-fat mayonnaise and salad dressings (reduced sodium). Avocado. Safflower, olive, or canola oils. Natural peanut or almond butter. °Other °Unsalted popcorn and pretzels. °The items listed above may not be a complete list of recommended foods or beverages. Contact your dietitian for more options. °WHAT FOODS ARE NOT RECOMMENDED? °Grains °White bread. White pasta. White rice. Refined cornbread. Bagels and croissants. Crackers that contain trans fat. °Vegetables °Creamed or fried vegetables. Vegetables in a cheese sauce. Regular canned vegetables. Regular canned tomato sauce and paste. Regular tomato and vegetable juices. °Fruits °Dried fruits. Canned fruit in light or heavy syrup. Fruit juice. °Meat and Other Protein Products °Fatty cuts of meat. Ribs, chicken wings, bacon, sausage, bologna, salami, chitterlings, fatback, hot dogs, bratwurst, and packaged luncheon meats. Salted nuts and seeds. Canned beans with salt. °Dairy °Whole or 2% milk, cream, half-and-half, and cream cheese. Whole-fat or sweetened yogurt. Full-fat   cheeses or blue cheese. Nondairy creamers and whipped toppings. Processed cheese, cheese spreads, or cheese  curds. °Condiments °Onion and garlic salt, seasoned salt, table salt, and sea salt. Canned and packaged gravies. Worcestershire sauce. Tartar sauce. Barbecue sauce. Teriyaki sauce. Soy sauce, including reduced sodium. Steak sauce. Fish sauce. Oyster sauce. Cocktail sauce. Horseradish. Ketchup and mustard. Meat flavorings and tenderizers. Bouillon cubes. Hot sauce. Tabasco sauce. Marinades. Taco seasonings. Relishes. °Fats and Oils °Butter, stick margarine, lard, shortening, ghee, and bacon fat. Coconut, palm kernel, or palm oils. Regular salad dressings. °Other °Pickles and olives. Salted popcorn and pretzels. °The items listed above may not be a complete list of foods and beverages to avoid. Contact your dietitian for more information. °WHERE CAN I FIND MORE INFORMATION? °National Heart, Lung, and [employer] Institute: www.nhlbi.nih.gov/health/health-topics/topics/dash/ °Document Released: 03/19/2011 Document Revised: 08/14/2013 Document Reviewed: 02/01/2013 °ExitCare® Patient Information ©2015 [employer]. This information is not intended to replace advice given to you by your health care provider. Make sure you discuss any questions you have with your health care provider. ° °

## 2014-02-16 NOTE — Progress Notes (Signed)
Patient here for follow up on her hypertension and cholesterol

## 2014-02-16 NOTE — Progress Notes (Signed)
MRN: [id number] Name: Shannon [name]  Sex: female Age: 58 y.o. DOB: 1956/01/16  Allergies: Morphine and related  Chief Complaint  Patient presents with  . Follow-up    HPI: Patient is 58 y.o. female who has history of hypertension, hyperlipidemia comes today for follow up, initially her blood pressure was elevated, repeat manual blood pressure is 130/90, patient is on Lasix lisinopril as well as metoprolol, she denies any acute symptoms denies any headache dizziness chest and shortness of breath, denies smoking cigarettes, her she also takes TriCor for hyperlipidemia.  Past Medical History  Diagnosis Date  . Hypertension     takes Lisinopril and Metoprolol daily  . Hyperlipidemia     takes Tricor daily  . Peripheral edema     takes lasix daily  . Dizziness     occasionally  . Numbness     stinging in right hand  . Ventral hernia     Past Surgical History  Procedure Laterality Date  . Abdominal hysterectomy    . Back surgery    . Fracture surgery      left leg  . Uterine fibroid surgery    . Colonscopy    . Ventral hernia repair N/A 05/24/2013    Procedure: OPEN REPAIR EPIGASTRIC VENTRAL HERNIA;  Surgeon: Imogene Burn. Georgette Dover, MD;  Location: [facility];  Service: General;  Laterality: N/A;  . Insertion of mesh N/A 05/24/2013    Procedure: INSERTION OF MESH;  Surgeon: Imogene Burn. Georgette Dover, MD;  Location: [facility];  Service: General;  Laterality: N/A;      Medication List       This list is accurate as of: 02/16/14  9:29 AM.  Always use your most recent med list.               cyclobenzaprine 10 MG tablet  Commonly known as:  FLEXERIL  Take 1 tablet (10 mg total) by mouth at bedtime.     fenofibrate 48 MG tablet  Commonly known as:  TRICOR  Take 1 tablet (48 mg total) by mouth daily.     furosemide 20 MG tablet  Commonly known as:  LASIX  Take 1 tablet (20 mg total) by mouth daily.     lisinopril 20 MG tablet  Commonly known as:  PRINIVIL,ZESTRIL  Take 1 tablet  (20 mg total) by mouth daily.     meloxicam 7.5 MG tablet  Commonly known as:  MOBIC  Take 7.5 mg by mouth daily.     metoprolol succinate 100 MG 24 hr tablet  Commonly known as:  TOPROL-XL  Take 1 tablet (100 mg total) by mouth daily. Take with or immediately following a meal.     oxyCODONE 5 MG immediate release tablet  Commonly known as:  Oxy IR/ROXICODONE  Take 1 tablet (5 mg total) by mouth every 8 (eight) hours as needed for severe pain.        No orders of the defined types were placed in this encounter.    Immunization History  Administered Date(s) Administered  . Influenza Whole 04/26/2009, 01/21/2010  . Influenza,inj,Quad PF,36+ Mos 02/16/2014  . Td 11/17/2007    History reviewed. No pertinent family history.  History  Substance Use Topics  . Smoking status: Never Smoker   . Smokeless tobacco: Not on file  . Alcohol Use: No    Review of Systems   As noted in HPI  Filed Vitals:   02/16/14 0928  BP: 130/90  Pulse:   Temp:  Resp:     Physical Exam  Physical Exam  Constitutional: No distress.  Eyes: EOM are normal. Pupils are equal, round, and reactive to light.  Cardiovascular: Normal rate and regular rhythm.   Pulmonary/Chest: Breath sounds normal. No respiratory distress. She has no wheezes. She has no rales.  Musculoskeletal: She exhibits no edema.    CBC    Component Value Date/Time   WBC 4.6 06/14/2013 1422   RBC 5.92* 06/14/2013 1422   HGB 14.1 06/14/2013 1422   HCT 43.4 06/14/2013 1422   PLT 305 06/14/2013 1422   MCV 73.3* 06/14/2013 1422   LYMPHSABS 1.9 06/14/2013 1422   MONOABS 0.3 06/14/2013 1422   EOSABS 0.2 06/14/2013 1422   BASOSABS 0.0 06/14/2013 1422    CMP     Component Value Date/Time   NA 144 10/11/2013 1044   K 4.4 10/11/2013 1044   CL 111 10/11/2013 1044   CO2 24 10/11/2013 1044   GLUCOSE 76 10/11/2013 1044   BUN 12 10/11/2013 1044   CREATININE 1.06 10/11/2013 1044   CREATININE 1.22* 05/22/2013 1512    CALCIUM 9.5 10/11/2013 1044   PROT 8.0 10/11/2013 1044   ALBUMIN 4.1 10/11/2013 1044   AST 31 10/11/2013 1044   ALT 32 10/11/2013 1044   ALKPHOS 53 10/11/2013 1044   BILITOT 0.3 10/11/2013 1044   GFRNONAA 59* 10/11/2013 1044   GFRNONAA 49* 05/22/2013 1512   GFRAA 68 10/11/2013 1044   GFRAA 57* 05/22/2013 1512    Lab Results  Component Value Date/Time   CHOL 197 06/14/2013 02:22 PM    No components found for: HGA1C  Lab Results  Component Value Date/Time   AST 31 10/11/2013 10:44 AM    Assessment and Plan  Essential hypertension, benign - Plan:still diastolic blood pressures on higher side  Advised for DASH diet continue with current meds, repeat COMPLETE METABOLIC PANEL WITH GFR  Dyslipidemia Currently patient is on tricor advised for low fat diet, will repeat fasting lipid panel on the next visit .  Needs flu shot Flu shot given today.  Health Maintenance -Colonoscopy: uptodate  Done in 2011  -Mammogram: uptodate  -Vaccinations:  Flu shot given today   Return in about 3 months (around 05/19/2014) for hypertension, hyperipidemia.  Lorayne Marek, MD

## 2014-02-21 IMAGING — CR DG ABDOMEN ACUTE W/ 1V CHEST
3 series · 3 of 3 positions shown · non-contrast
Comparison: Chest x-ray dated 03/21/2012 and abdomen radiograph
dated 09/20/2012

CLINICAL DATA: Abdominal pain.

EXAM:
ACUTE ABDOMEN SERIES (ABDOMEN 2 VIEW & CHEST 1 VIEW)

[w chest pa]
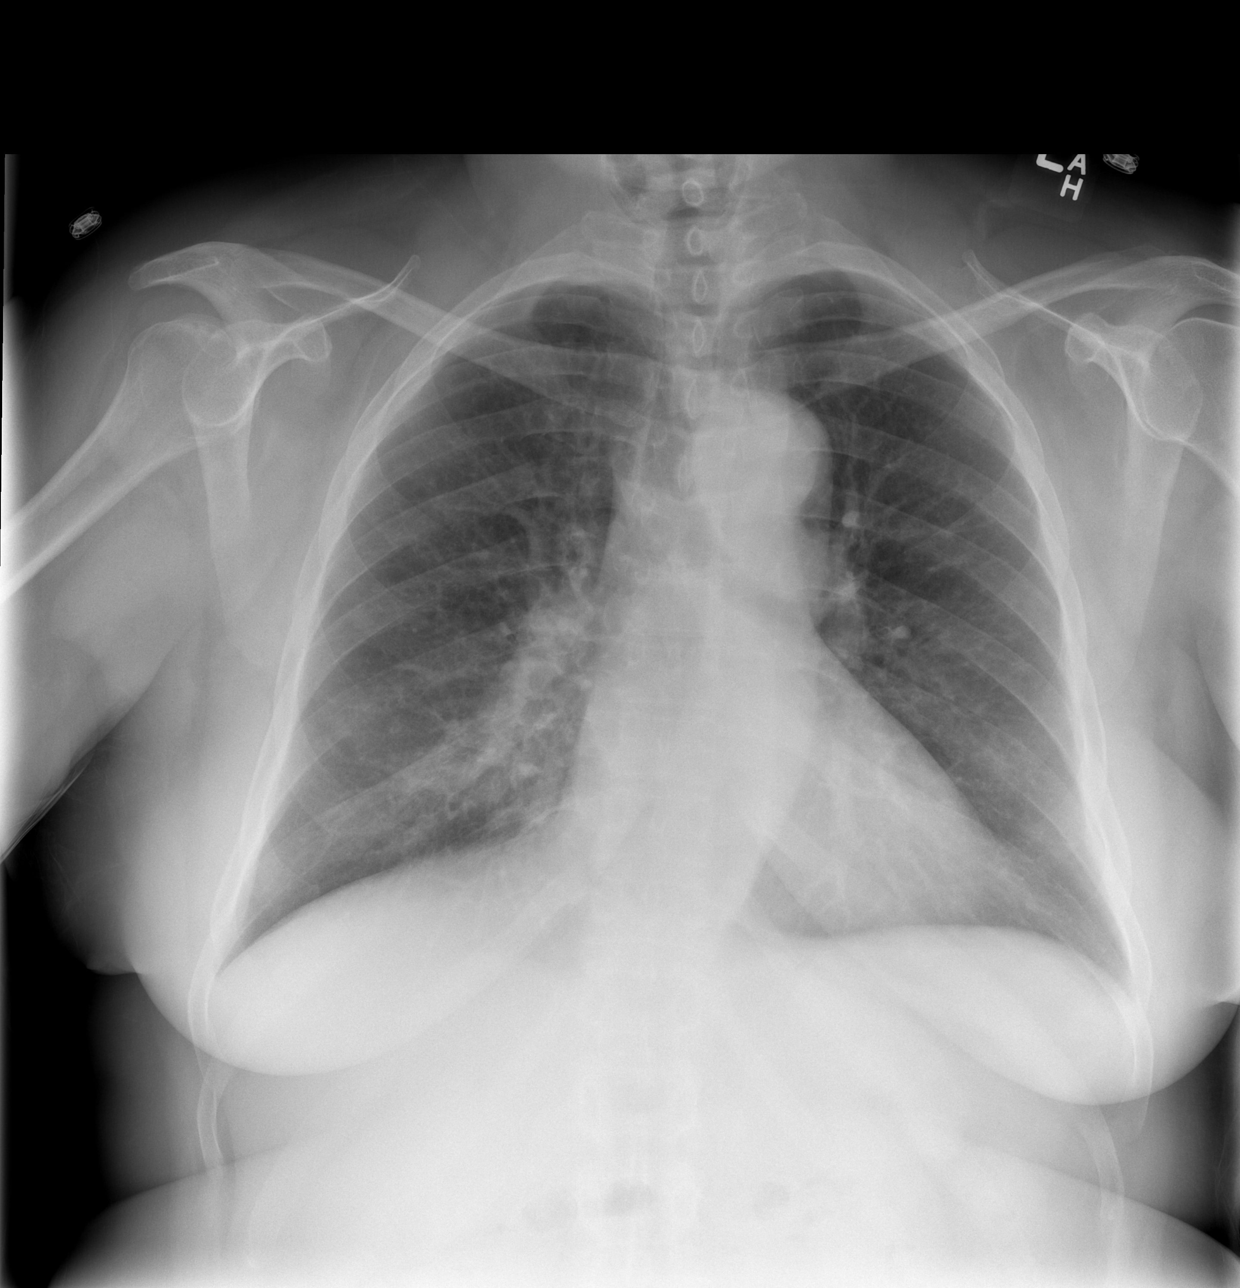

[w abdomen upright]
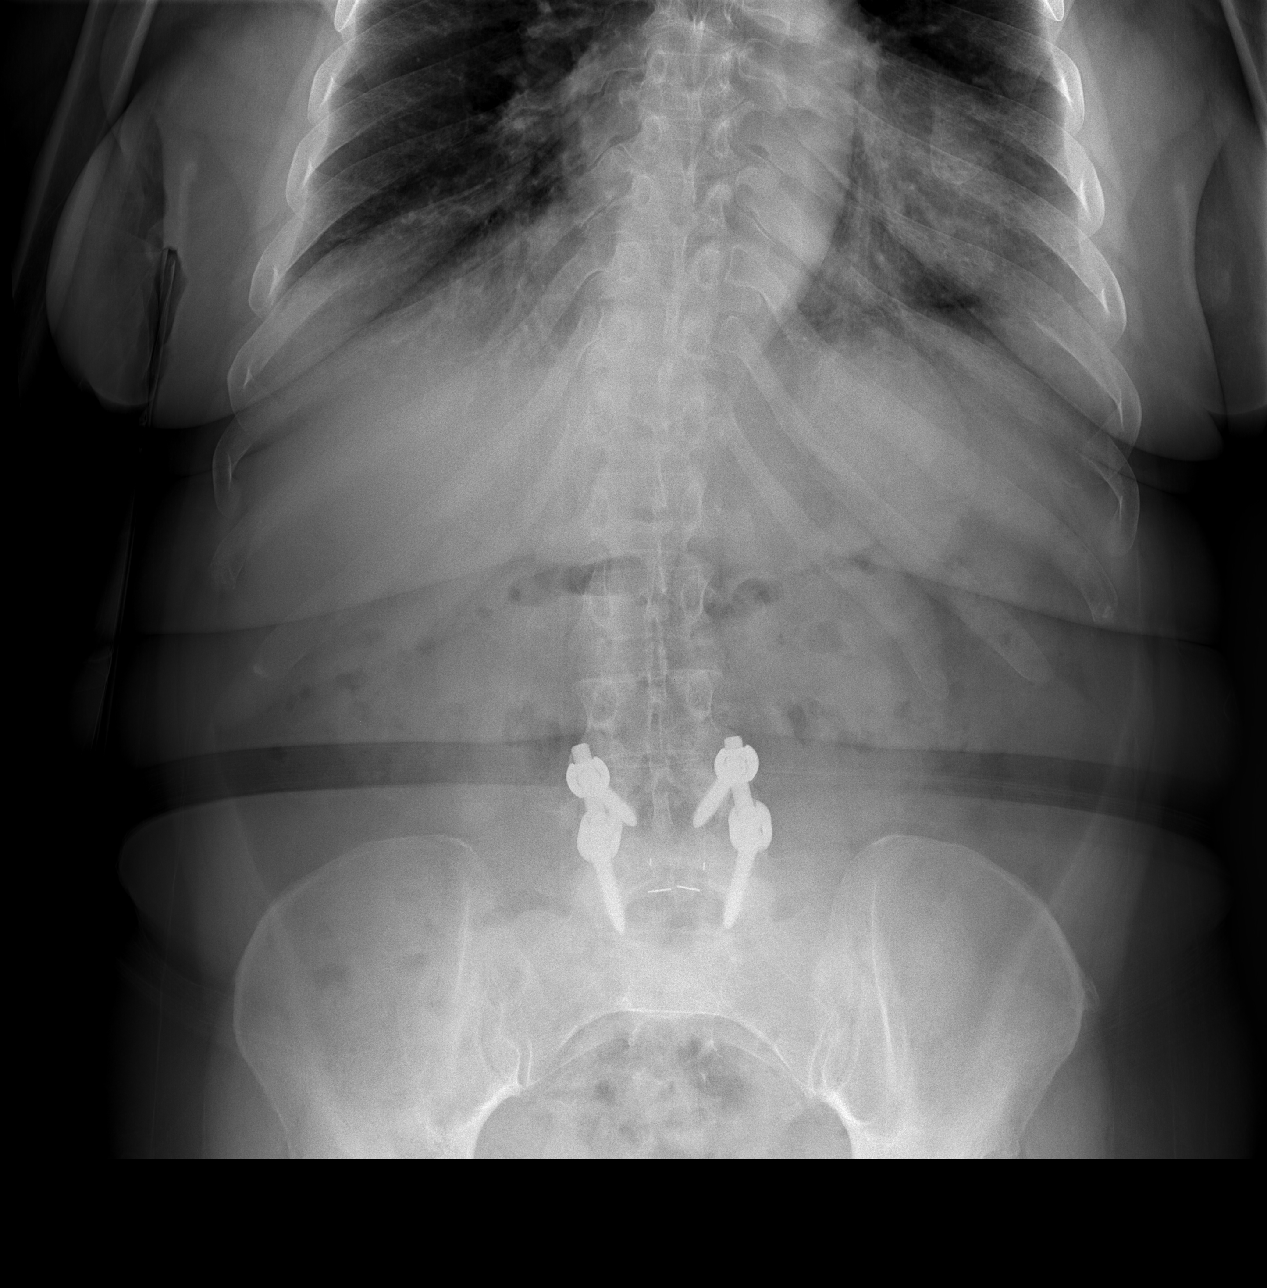

[t abdomen supine]
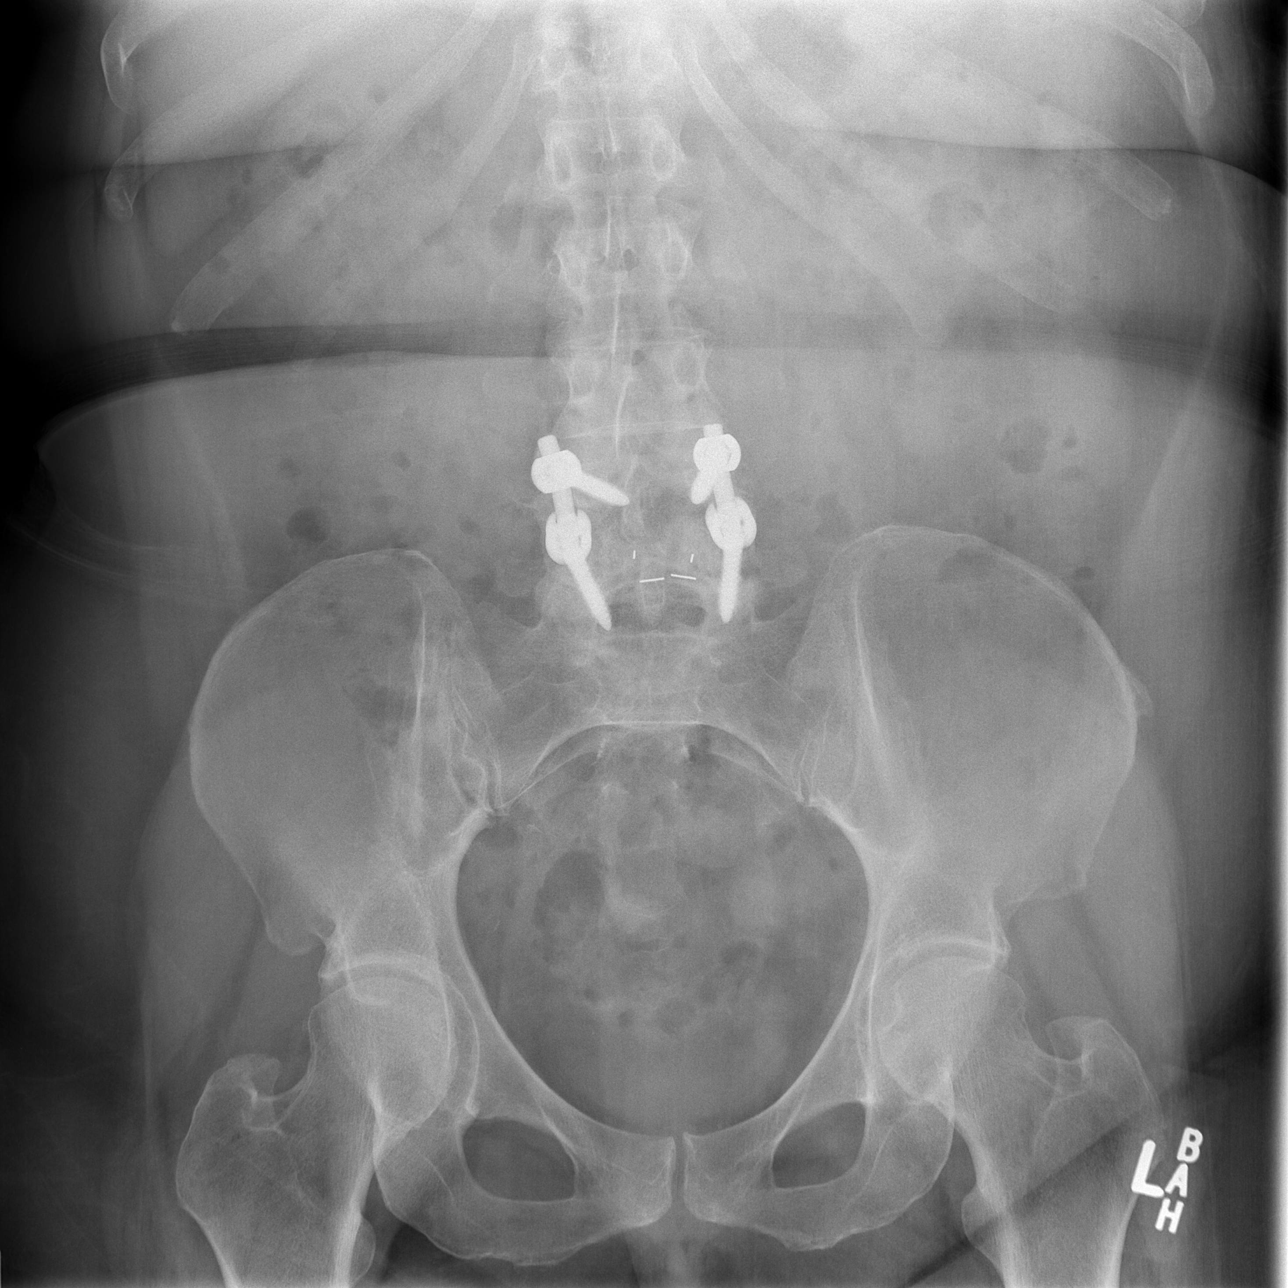

[3 of 3 positions shown; findings below may reference images not displayed]

FINDINGS: Heart size and pulmonary vascularity are normal and the lungs are
clear. Is no free air or free fluid in the abdomen. There is a small
amount of air scattered throughout normal appearing loops of bowel.
No acute osseous abnormality. Prior lumbar fusion at L4-5.
IMPRESSION: Benign appearing abdomen and chest.

## 2014-03-01 ENCOUNTER — Encounter: Payer: Self-pay | Admitting: Registered Nurse

## 2014-03-01 ENCOUNTER — Encounter: Payer: Medicare Other | Attending: Physical Medicine & Rehabilitation | Admitting: Registered Nurse

## 2014-03-01 VITALS — BP 168/107 | HR 90 | Resp 14 | Ht 66.0 in | Wt 173.0 lb

## 2014-03-01 DIAGNOSIS — G8929 Other chronic pain: Secondary | ICD-10-CM | POA: Diagnosis not present

## 2014-03-01 DIAGNOSIS — M961 Postlaminectomy syndrome, not elsewhere classified: Secondary | ICD-10-CM | POA: Diagnosis not present

## 2014-03-01 DIAGNOSIS — Z79899 Other long term (current) drug therapy: Secondary | ICD-10-CM | POA: Diagnosis not present

## 2014-03-01 DIAGNOSIS — Z5181 Encounter for therapeutic drug level monitoring: Secondary | ICD-10-CM | POA: Diagnosis not present

## 2014-03-01 DIAGNOSIS — M549 Dorsalgia, unspecified: Secondary | ICD-10-CM

## 2014-03-01 DIAGNOSIS — M47817 Spondylosis without myelopathy or radiculopathy, lumbosacral region: Secondary | ICD-10-CM

## 2014-03-01 DIAGNOSIS — Z760 Encounter for issue of repeat prescription: Secondary | ICD-10-CM | POA: Insufficient documentation

## 2014-03-01 MED ORDER — OXYCODONE HCL 5 MG PO TABS: 5.0000 mg | ORAL_TABLET | Freq: Four times a day (QID) | ORAL | Status: DC | PRN

## 2014-03-01 NOTE — Progress Notes (Signed)
Subjective:    Patient ID: Shannon [name], female    DOB: 1955/08/08, 58 y.o.   MRN: [id number]  HPI: Shannon [name] is a 58 year old female who returns for follow up for chronic pain and medication refill. She says her pain is located in her lower back. She has been having increased intensity of low back pain since the weather is changing. She is using heat therapy in addition with medication regime. She rates her pain 6. Her current exercise regime is performing stretching exercises, sit-ups,and jumping-jacks daily. Arrived Hypertensive and Blood Pressure re-checked 154/86.  Pain Inventory Average Pain 7 Pain Right Now 6 My pain is intermittent and stabbing  In the last 24 hours, has pain interfered with the following? General activity 5 Relation with others 5 Enjoyment of life 5 What TIME of day is your pain at its worst? morning Sleep (in general) Fair  Pain is worse with: walking, bending and standing Pain improves with: heat/ice and medication Relief from Meds: 5  Mobility use a cane how many minutes can you walk? 20 ability to climb steps?  yes do you drive?  no  Function disabled: date disabled .  Neuro/Psych numbness trouble walking  Prior Studies Any changes since last visit?  no  Physicians involved in your care Any changes since last visit?  no   History reviewed. No pertinent family history. History   Social History  . Marital Status: Legally Separated    Spouse Name: N/A    Number of Children: N/A  . Years of Education: N/A   Social History Main Topics  . Smoking status: Never Smoker   . Smokeless tobacco: None  . Alcohol Use: No  . Drug Use: No  . Sexual Activity: Yes    Birth Control/ Protection: Surgical   Other Topics Concern  . None   Social History Narrative   ** Merged History Encounter **       Past Surgical History  Procedure Laterality Date  . Abdominal hysterectomy    . Back surgery    . Fracture surgery        left leg  . Uterine fibroid surgery    . Colonscopy    . Ventral hernia repair N/A 05/24/2013    Procedure: OPEN REPAIR EPIGASTRIC VENTRAL HERNIA;  Surgeon: Imogene Burn. Georgette Dover, MD;  Location: [facility];  Service: General;  Laterality: N/A;  . Insertion of mesh N/A 05/24/2013    Procedure: INSERTION OF MESH;  Surgeon: Imogene Burn. Georgette Dover, MD;  Location: [facility];  Service: General;  Laterality: N/A;   Past Medical History  Diagnosis Date  . Hypertension     takes Lisinopril and Metoprolol daily  . Hyperlipidemia     takes Tricor daily  . Peripheral edema     takes lasix daily  . Dizziness     occasionally  . Numbness     stinging in right hand  . Ventral hernia    BP 168/107 mmHg  Pulse 90  Resp 14  Ht 5\' 6"  (1.676 m)  Wt 173 lb (78.472 kg)  BMI 27.94 kg/m2  SpO2 99%  Opioid Risk Score:   Fall Risk Score: Low Fall Risk (0-5 points)  Review of Systems  Constitutional: Negative.   HENT: Negative.   Eyes: Negative.   Respiratory: Negative.   Cardiovascular: Negative.   Gastrointestinal: Negative.   Endocrine: Negative.   Genitourinary: Negative.   Musculoskeletal: Positive for myalgias and back pain.  Skin: Negative.  Allergic/Immunologic: Negative.   Neurological: Positive for numbness.       Trouble walking  Hematological: Negative.   Psychiatric/Behavioral: Negative.        Objective:   Physical Exam  Constitutional: She is oriented to person, place, and time. She appears well-developed and well-nourished.  HENT:  Head: Normocephalic and atraumatic.  Neck: Normal range of motion. Neck supple.  Cardiovascular: Normal rate and regular rhythm.   Pulmonary/Chest: Effort normal and breath sounds normal.  Musculoskeletal:  Normal Muscle Bulk and Muscle Testing Reveals: Upper Extremities: Full ROM and Muscle strength 5/5 Lumbar Paraspinal Tenderness: L-3- L-5 Lower Extremities: Full ROM and Muscle Strength 5/5 Arises from chair with ease Narrow Based Gait/ Uses  straight Cane for support.   Neurological: She is alert and oriented to person, place, and time.  Skin: Skin is warm and dry.  Psychiatric: She has a normal mood and affect.  Nursing note and vitals reviewed.         Assessment & Plan:  1. Lumbar postlaminectomy syndrome with chronic lumbar pain:  Refilled: Oxycodone IR 5 mg one tablet every 6 hours as needed increase to #100. May take an extra Tablet when Pain is sever No More than 4 a day  15 minutes of face to face patient care time was spent during this visit all questions were encouraged and answered.   F/U in 1 month

## 2014-03-12 ENCOUNTER — Telehealth: Payer: Self-pay | Admitting: Internal Medicine

## 2014-03-12 NOTE — Telephone Encounter (Signed)
Pt requesting refill on fenofibrate (TRICOR) 48 MG tablet, pt fills scripts at [facility] on [address]. Please f/u with pt.

## 2014-03-15 ENCOUNTER — Telehealth: Payer: Self-pay

## 2014-03-15 NOTE — Telephone Encounter (Signed)
Returned patient phone call Patient not available Left message on voice mail to return our call 

## 2014-03-20 NOTE — Telephone Encounter (Signed)
Pt. returned call from nurse in regards to prescription refill

## 2014-03-21 ENCOUNTER — Other Ambulatory Visit: Payer: Self-pay | Admitting: Internal Medicine

## 2014-03-21 ENCOUNTER — Telehealth: Payer: Self-pay | Admitting: Internal Medicine

## 2014-03-21 NOTE — Telephone Encounter (Signed)
Patient is calling in to request medication refill for fenofibrate (TRICOR) 48 MG tablet; please f/u with patient

## 2014-03-22 ENCOUNTER — Other Ambulatory Visit: Payer: Self-pay

## 2014-03-22 MED ORDER — FENOFIBRATE 48 MG PO TABS: 48.0000 mg | ORAL_TABLET | Freq: Every day | ORAL | Status: DC

## 2014-03-26 ENCOUNTER — Other Ambulatory Visit: Payer: Self-pay

## 2014-03-26 DIAGNOSIS — Z1231 Encounter for screening mammogram for malignant neoplasm of breast: Secondary | ICD-10-CM

## 2014-03-29 ENCOUNTER — Ambulatory Visit: Payer: Medicare Other | Admitting: Registered Nurse

## 2014-03-29 ENCOUNTER — Encounter: Payer: Self-pay | Admitting: Physical Medicine & Rehabilitation

## 2014-03-29 ENCOUNTER — Ambulatory Visit (HOSPITAL_BASED_OUTPATIENT_CLINIC_OR_DEPARTMENT_OTHER): Payer: Medicare Other | Admitting: Physical Medicine & Rehabilitation

## 2014-03-29 ENCOUNTER — Encounter: Payer: Medicare Other | Attending: Physical Medicine & Rehabilitation

## 2014-03-29 VITALS — BP 166/111 | HR 87 | Resp 14

## 2014-03-29 DIAGNOSIS — E785 Hyperlipidemia, unspecified: Secondary | ICD-10-CM | POA: Insufficient documentation

## 2014-03-29 DIAGNOSIS — Z981 Arthrodesis status: Secondary | ICD-10-CM | POA: Insufficient documentation

## 2014-03-29 DIAGNOSIS — Z760 Encounter for issue of repeat prescription: Secondary | ICD-10-CM | POA: Insufficient documentation

## 2014-03-29 DIAGNOSIS — I10 Essential (primary) hypertension: Secondary | ICD-10-CM | POA: Insufficient documentation

## 2014-03-29 DIAGNOSIS — R609 Edema, unspecified: Secondary | ICD-10-CM | POA: Insufficient documentation

## 2014-03-29 DIAGNOSIS — M961 Postlaminectomy syndrome, not elsewhere classified: Secondary | ICD-10-CM | POA: Diagnosis not present

## 2014-03-29 DIAGNOSIS — M545 Low back pain: Secondary | ICD-10-CM | POA: Insufficient documentation

## 2014-03-29 DIAGNOSIS — G8929 Other chronic pain: Secondary | ICD-10-CM | POA: Insufficient documentation

## 2014-03-29 MED ORDER — OXYCODONE HCL 5 MG PO TABS: 5.0000 mg | ORAL_TABLET | Freq: Four times a day (QID) | ORAL | Status: DC | PRN

## 2014-03-29 NOTE — Progress Notes (Signed)
Subjective:    Patient ID: Shannon [name], female    DOB: 1955/11/20, 58 y.o.   MRN: [id number]  HPI Chief complaint is low back pain, chronic  History of L4-L5 fusion in 2011 or 2012 Head short-term relief with L5-S1 facet injections, intra-articular performed in June 2015. Pain went down from 7/10-4/10 but this only lasted a short time.  Continues on oxycodone 3 times per day on most days occasionally takes 4 times a day. Has shooting pains down the left leg Pain Inventory Average Pain 6 Pain Right Now 7 My pain is na  In the last 24 hours, has pain interfered with the following? General activity 4 Relation with others 3 Enjoyment of life 6 What TIME of day is your pain at its worst? evening Sleep (in general) NA  Pain is worse with: walking, bending and standing Pain improves with: heat/ice and medication Relief from Meds: 5  Mobility use a cane how many minutes can you walk? 20 ability to climb steps?  yes do you drive?  no  Function disabled: date disabled 2010  Neuro/Psych numbness tingling  Prior Studies Any changes since last visit?  no  Physicians involved in your care Any changes since last visit?  no   History reviewed. No pertinent family history. History   Social History  . Marital Status: Legally Separated    Spouse Name: N/A    Number of Children: N/A  . Years of Education: N/A   Social History Main Topics  . Smoking status: Never Smoker   . Smokeless tobacco: None  . Alcohol Use: No  . Drug Use: No  . Sexual Activity: Yes    Birth Control/ Protection: Surgical   Other Topics Concern  . None   Social History Narrative   ** Merged History Encounter **       Past Surgical History  Procedure Laterality Date  . Abdominal hysterectomy    . Back surgery    . Fracture surgery      left leg  . Uterine fibroid surgery    . Colonscopy    . Ventral hernia repair N/A 05/24/2013    Procedure: OPEN REPAIR EPIGASTRIC VENTRAL  HERNIA;  Surgeon: Imogene Burn. Georgette Dover, MD;  Location: [place];  Service: General;  Laterality: N/A;  . Insertion of mesh N/A 05/24/2013    Procedure: INSERTION OF MESH;  Surgeon: Imogene Burn. Georgette Dover, MD;  Location: [facility];  Service: General;  Laterality: N/A;   Past Medical History  Diagnosis Date  . Hypertension     takes Lisinopril and Metoprolol daily  . Hyperlipidemia     takes Tricor daily  . Peripheral edema     takes lasix daily  . Dizziness     occasionally  . Numbness     stinging in right hand  . Ventral hernia    BP 166/111 mmHg  Pulse 87  Resp 14  SpO2 99%  Opioid Risk Score:   Fall Risk Score: Moderate Fall Risk (6-13 points) (previously eudcated and given handout) Review of Systems  Gastrointestinal: Positive for diarrhea.  Neurological: Positive for numbness.       Tingling  All other systems reviewed and are negative.      Objective:   Physical Exam  Lumbar spine range of motion 75% flexion extension lateral rotation and bending. Tenderness palpation in numerous areas of the thoracic and lumbar area paraspinal muscles. Light touch sensation intact bilateral lower extremities   Negative straight leg raising Deep tendon  reflexes 2+ bilateral patellar and Achilles  Motor strength is 5/5 bilateral hip flexor and knee extensor right ankle dorsiflexor, 4/5 left ankle dorsiflexor.     Assessment & Plan:  1. 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 The back pain is more problematic. She did have some partial response to L5-S1 intra-articular injections however no long-term relief. I do not think repeat injection would be helpful in this area.  We'll continue oxycodone 5 mg 3 times a day, 100 tablets per month so that she can take 4 tablets per day on bad days. In terms of the left lower pain this appears to be chronic intermittent radicular discomfort. Unless this  becomes more constant I would not recommend medication such as gabapentin.  Return in 1 month nurse practitioner visit. M.D. In 6 months

## 2014-04-04 ENCOUNTER — Other Ambulatory Visit: Payer: Self-pay | Admitting: Internal Medicine

## 2014-04-17 ENCOUNTER — Other Ambulatory Visit: Payer: Self-pay

## 2014-04-17 ENCOUNTER — Telehealth: Payer: Self-pay

## 2014-04-17 MED ORDER — LISINOPRIL 20 MG PO TABS: 20.0000 mg | ORAL_TABLET | Freq: Every day | ORAL | Status: DC

## 2014-04-17 MED ORDER — MELOXICAM 7.5 MG PO TABS: 7.5000 mg | ORAL_TABLET | Freq: Every day | ORAL | Status: DC

## 2014-04-17 NOTE — Telephone Encounter (Signed)
Patient calling requesting refill on her medication Has been out for a few days Prescriptions for lisinopril and meloxican sent [facility] on file

## 2014-04-23 ENCOUNTER — Encounter: Payer: Medicare Other | Attending: Physical Medicine & Rehabilitation

## 2014-04-23 ENCOUNTER — Ambulatory Visit (HOSPITAL_BASED_OUTPATIENT_CLINIC_OR_DEPARTMENT_OTHER): Payer: Medicare Other | Admitting: Physical Medicine & Rehabilitation

## 2014-04-23 ENCOUNTER — Other Ambulatory Visit: Payer: Self-pay | Admitting: Physical Medicine & Rehabilitation

## 2014-04-23 ENCOUNTER — Encounter: Payer: Self-pay | Admitting: Physical Medicine & Rehabilitation

## 2014-04-23 VITALS — BP 155/92 | HR 83 | Resp 14

## 2014-04-23 DIAGNOSIS — G894 Chronic pain syndrome: Secondary | ICD-10-CM | POA: Insufficient documentation

## 2014-04-23 DIAGNOSIS — M961 Postlaminectomy syndrome, not elsewhere classified: Secondary | ICD-10-CM

## 2014-04-23 DIAGNOSIS — Z5181 Encounter for therapeutic drug level monitoring: Secondary | ICD-10-CM | POA: Diagnosis not present

## 2014-04-23 DIAGNOSIS — M47817 Spondylosis without myelopathy or radiculopathy, lumbosacral region: Secondary | ICD-10-CM

## 2014-04-23 DIAGNOSIS — Z79899 Other long term (current) drug therapy: Secondary | ICD-10-CM

## 2014-04-23 DIAGNOSIS — R269 Unspecified abnormalities of gait and mobility: Secondary | ICD-10-CM | POA: Insufficient documentation

## 2014-04-23 DIAGNOSIS — Z760 Encounter for issue of repeat prescription: Secondary | ICD-10-CM | POA: Diagnosis not present

## 2014-04-23 DIAGNOSIS — Z79891 Long term (current) use of opiate analgesic: Secondary | ICD-10-CM | POA: Insufficient documentation

## 2014-04-23 DIAGNOSIS — R200 Anesthesia of skin: Secondary | ICD-10-CM | POA: Insufficient documentation

## 2014-04-23 MED ORDER — OXYCODONE HCL 5 MG PO TABS: 5.0000 mg | ORAL_TABLET | Freq: Four times a day (QID) | ORAL | Status: DC | PRN

## 2014-04-23 NOTE — Patient Instructions (Signed)
Dr Jovita Gamma  [facility]

## 2014-04-23 NOTE — Progress Notes (Signed)
Subjective:    Patient ID: Shannon [name], female    DOB: 12-31-1955, 59 y.o.   MRN: [id number]  HPI History of L4-L5 fusion in 2011 or 2012  Head short-term relief with L5-S1 facet injections, intra-articular performed in June 2015. Pain went down from 7/10-4/10 but this only lasted a short time.   Pain Inventory Average Pain 4 Pain Right Now 6 My pain is stabbing  In the last 24 hours, has pain interfered with the following? General activity 5 Relation with others 6 Enjoyment of life 8 What TIME of day is your pain at its worst? evening Sleep (in general) Fair  Pain is worse with: walking, bending and standing Pain improves with: heat/ice and medication Relief from Meds: 5  Mobility walk with assistance use a cane how many minutes can you walk? 20 ability to climb steps?  yes do you drive?  no Do you have any goals in this area?  no  Function disabled: date disabled 2010 I need assistance with the following:  household duties and shopping Do you have any goals in this area?  no  Neuro/Psych numbness trouble walking  Prior Studies Any changes since last visit?  no  Physicians involved in your care Any changes since last visit?  no   History reviewed. No pertinent family history. History   Social History  . Marital Status: Legally Separated    Spouse Name: N/A    Number of Children: N/A  . Years of Education: N/A   Social History Main Topics  . Smoking status: Never Smoker   . Smokeless tobacco: None  . Alcohol Use: No  . Drug Use: No  . Sexual Activity: Yes    Birth Control/ Protection: Surgical   Other Topics Concern  . None   Social History Narrative   ** Merged History Encounter **       Past Surgical History  Procedure Laterality Date  . Abdominal hysterectomy    . Back surgery    . Fracture surgery      left leg  . Uterine fibroid surgery    . Colonscopy    . Ventral hernia repair N/A 05/24/2013    Procedure: OPEN REPAIR  EPIGASTRIC VENTRAL HERNIA;  Surgeon: Imogene Burn. Georgette Dover, MD;  Location: [place];  Service: General;  Laterality: N/A;  . Insertion of mesh N/A 05/24/2013    Procedure: INSERTION OF MESH;  Surgeon: Imogene Burn. Georgette Dover, MD;  Location: [place] [facility];  Service: General;  Laterality: N/A;   Past Medical History  Diagnosis Date  . Hypertension     takes Lisinopril and Metoprolol daily  . Hyperlipidemia     takes Tricor daily  . Peripheral edema     takes lasix daily  . Dizziness     occasionally  . Numbness     stinging in right hand  . Ventral hernia    BP 155/92 mmHg  Pulse 83  Resp 14  SpO2 96%  Opioid Risk Score:   Fall Risk Score: Low Fall Risk (0-5 points)  Review of Systems  Musculoskeletal: Positive for gait problem.  Neurological: Positive for numbness.  All other systems reviewed and are negative.      Objective:   Physical Exam  Constitutional: She is oriented to person, place, and time.  Neurological: She is alert and oriented to person, place, and time. She has normal strength.  Psychiatric: She has a normal mood and affect.  Nursing note and vitals reviewed.  Assessment & Plan:  . 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 The back pain is more problematic. She did have some partial response to L5-S1 intra-articular injections however no long-term relief. I do not think repeat injection would be helpful in this area.  We'll continue oxycodone 5 mg 3 times a day, 100 tablets per month so that she can take 4 tablets per day on bad days.  In terms of the left lower pain this appears to be chronic intermittent radicular discomfort. Unless this becomes more constant I would not recommend medication such as gabapentin.  Return in 1 month nurse practitioner visit. M.D. In 6 months

## 2014-04-24 LAB — PMP ALCOHOL METABOLITE (ETG): Ethyl Glucuronide (EtG): NEGATIVE ng/mL

## 2014-04-29 LAB — OXYCODONE, URINE (LC/MS-MS)
Noroxycodone, Ur: 425 ng/mL (ref <50)
OXYMORPHONE, URINE: 123 ng/mL (ref <50)
Oxycodone, ur: 189 ng/mL (ref <50)

## 2014-05-01 LAB — PRESCRIPTION MONITORING PROFILE (SOLSTAS)
AMPHETAMINE/METH: NEGATIVE ng/mL
BARBITURATE SCREEN, URINE: NEGATIVE ng/mL
Benzodiazepine Screen, Urine: NEGATIVE ng/mL
Buprenorphine, Urine: NEGATIVE ng/mL
CANNABINOID SCRN UR: NEGATIVE ng/mL
CARISOPRODOL, URINE: NEGATIVE ng/mL
Cocaine Metabolites: NEGATIVE ng/mL
Creatinine, Urine: 65.43 mg/dL (ref >20.0)
ECSTASY: NEGATIVE ng/mL
Fentanyl, Ur: NEGATIVE ng/mL
MEPERIDINE UR: NEGATIVE ng/mL
Methadone Screen, Urine: NEGATIVE ng/mL
NITRITES URINE, INITIAL: NEGATIVE ug/mL
Opiate Screen, Urine: NEGATIVE ng/mL
Propoxyphene: NEGATIVE ng/mL
TAPENTADOLUR: NEGATIVE ng/mL
TRAMADOL UR: NEGATIVE ng/mL
ZOLPIDEM, URINE: NEGATIVE ng/mL
pH, Initial: 4.8 pH (ref 4.5–8.9)

## 2014-05-04 ENCOUNTER — Ambulatory Visit: Payer: Medicare Other

## 2014-05-09 ENCOUNTER — Telehealth: Payer: Self-pay

## 2014-05-09 ENCOUNTER — Ambulatory Visit: Payer: Medicare Other | Admitting: Internal Medicine

## 2014-05-09 MED ORDER — CYCLOBENZAPRINE HCL 10 MG PO TABS: 10.0000 mg | ORAL_TABLET | Freq: Every day | ORAL | Status: DC

## 2014-05-09 NOTE — Telephone Encounter (Signed)
Patient called requesting a refill on her flexerill Prescription sent to pharmacy

## 2014-05-17 ENCOUNTER — Ambulatory Visit
Admission: RE | Admit: 2014-05-17 | Discharge: 2014-05-17 | Disposition: A | Discharge status: Home / Self Care | Payer: Medicare Other | Source: Ambulatory Visit

## 2014-05-17 DIAGNOSIS — Z1231 Encounter for screening mammogram for malignant neoplasm of breast: Secondary | ICD-10-CM

## 2014-05-17 NOTE — Progress Notes (Signed)
Urine drug screen for this encounter is consistent for prescribed medication 

## 2014-05-23 ENCOUNTER — Other Ambulatory Visit: Payer: Self-pay | Admitting: Internal Medicine

## 2014-05-24 ENCOUNTER — Encounter: Payer: Self-pay | Admitting: Registered Nurse

## 2014-05-24 ENCOUNTER — Encounter: Payer: Medicare Other | Attending: Physical Medicine & Rehabilitation | Admitting: Registered Nurse

## 2014-05-24 VITALS — BP 142/92 | HR 80 | Resp 14

## 2014-05-24 DIAGNOSIS — M47817 Spondylosis without myelopathy or radiculopathy, lumbosacral region: Secondary | ICD-10-CM | POA: Diagnosis not present

## 2014-05-24 DIAGNOSIS — M961 Postlaminectomy syndrome, not elsewhere classified: Secondary | ICD-10-CM | POA: Diagnosis not present

## 2014-05-24 DIAGNOSIS — R200 Anesthesia of skin: Secondary | ICD-10-CM | POA: Insufficient documentation

## 2014-05-24 DIAGNOSIS — G894 Chronic pain syndrome: Secondary | ICD-10-CM | POA: Diagnosis not present

## 2014-05-24 DIAGNOSIS — Z79899 Other long term (current) drug therapy: Secondary | ICD-10-CM | POA: Diagnosis not present

## 2014-05-24 DIAGNOSIS — Z5181 Encounter for therapeutic drug level monitoring: Secondary | ICD-10-CM | POA: Diagnosis not present

## 2014-05-24 DIAGNOSIS — Z79891 Long term (current) use of opiate analgesic: Secondary | ICD-10-CM | POA: Insufficient documentation

## 2014-05-24 DIAGNOSIS — R269 Unspecified abnormalities of gait and mobility: Secondary | ICD-10-CM | POA: Insufficient documentation

## 2014-05-24 MED ORDER — OXYCODONE HCL 5 MG PO TABS: 5.0000 mg | ORAL_TABLET | Freq: Four times a day (QID) | ORAL | Status: DC | PRN

## 2014-05-24 NOTE — Progress Notes (Signed)
Subjective:    Patient ID: Shannon [name], female    DOB: 1955/12/30, 59 y.o.   MRN: [id number]  HPI: Ms. Shannon [name] is a 59 year old female who returns for follow up for chronic pain and medication refill. She says her pain is located in her lower back. She rates her pain 7. Her current exercise regime is performing stretching exercises, sit-ups,and leg lifts. Arrived Hypertensive and Blood Pressure re-checked 140/86 states she had her anti-hypertensives this morning.   Pain Inventory Average Pain 5 Pain Right Now 7 My pain is intermittent and stabbing  In the last 24 hours, has pain interfered with the following? General activity 4 Relation with others 3 Enjoyment of life 6 What TIME of day is your pain at its worst? evening Sleep (in general) Fair  Pain is worse with: walking, bending, sitting, standing and some activites Pain improves with: heat/ice, therapy/exercise and medication Relief from Meds: 6  Mobility walk without assistance how many minutes can you walk? 20 ability to climb steps?  yes do you drive?  yes  Function disabled: date disabled .  Neuro/Psych numbness  Prior Studies Any changes since last visit?  no  Physicians involved in your care Any changes since last visit?  no   History reviewed. No pertinent family history. History   Social History  . Marital Status: Legally Separated    Spouse Name: N/A  . Number of Children: N/A  . Years of Education: N/A   Social History Main Topics  . Smoking status: Never Smoker   . Smokeless tobacco: Not on file  . Alcohol Use: No  . Drug Use: No  . Sexual Activity: Yes    Birth Control/ Protection: Surgical   Other Topics Concern  . None   Social History Narrative   ** Merged History Encounter **       Past Surgical History  Procedure Laterality Date  . Abdominal hysterectomy    . Back surgery    . Fracture surgery      left leg  . Uterine fibroid surgery    . Colonscopy      . Ventral hernia repair N/A 05/24/2013    Procedure: OPEN REPAIR EPIGASTRIC VENTRAL HERNIA;  Surgeon: Imogene Burn. Georgette Dover, MD;  Location: [place];  Service: General;  Laterality: N/A;  . Insertion of mesh N/A 05/24/2013    Procedure: INSERTION OF MESH;  Surgeon: Imogene Burn. Georgette Dover, MD;  Location: [place] [facility];  Service: General;  Laterality: N/A;   Past Medical History  Diagnosis Date  . Hypertension     takes Lisinopril and Metoprolol daily  . Hyperlipidemia     takes Tricor daily  . Peripheral edema     takes lasix daily  . Dizziness     occasionally  . Numbness     stinging in right hand  . Ventral hernia    BP 142/92 mmHg  Pulse 80  Resp 14  SpO2 97%  Opioid Risk Score:   Fall Risk Score: Low Fall Risk (0-5 points)  Review of Systems  Constitutional:       Night sweats  HENT: Negative.   Eyes: Negative.   Respiratory: Negative.   Cardiovascular: Negative.   Gastrointestinal: Negative.   Endocrine: Negative.   Genitourinary: Negative.   Musculoskeletal: Positive for myalgias, back pain and arthralgias.  Skin: Negative.   Allergic/Immunologic: Negative.   Neurological: Positive for numbness.  Hematological: Negative.   Psychiatric/Behavioral: Negative.  Objective:   Physical Exam  Constitutional: She is oriented to person, place, and time. She appears well-developed and well-nourished.  HENT:  Head: Normocephalic and atraumatic.  Neck: Normal range of motion. Neck supple.  Cervical Paraspinal Tenderness: C-5-C-7  Cardiovascular: Normal rate and regular rhythm.   Pulmonary/Chest: Effort normal and breath sounds normal.  Musculoskeletal:  Normal Muscle Bulk and Muscle Testing Reveals: Upper Extremities: Full ROM and Muscle strength 5/5 Lumbar Paraspinal Tenderness: L-3- L-5 Lower Extremities: Full ROM and Muscle strength 5/5 Arises from chair with ease Narrow Based Gait  Neurological: She is alert and oriented to person, place, and time.  Skin: Skin is warm and  dry.  Psychiatric: She has a normal mood and affect.  Nursing note and vitals reviewed.         Assessment & Plan:  1. Lumbar postlaminectomy syndrome with chronic lumbar pain:  Refilled: Oxycodone IR 5 mg one tablet every 6 hours as needed  #100. May take an extra Tablet when Pain is sever No More than 4 a day  15 minutes of face to face patient care time was spent during this visit all questions were encouraged and answered.   F/U in 1 month

## 2014-05-29 ENCOUNTER — Telehealth: Payer: Self-pay | Admitting: Internal Medicine

## 2014-05-29 ENCOUNTER — Telehealth: Payer: Self-pay | Admitting: Emergency Medicine

## 2014-05-29 MED ORDER — MELOXICAM 7.5 MG PO TABS: 7.5000 mg | ORAL_TABLET | Freq: Every day | ORAL | Status: DC

## 2014-05-29 NOTE — Telephone Encounter (Signed)
Patient has called in to ask about the status of her HBP medication; please f/u with patient about this request

## 2014-05-29 NOTE — Telephone Encounter (Signed)
Pt given #30 day supply refill Meloxicam until seen by provider E-scribed to [place]

## 2014-05-30 ENCOUNTER — Ambulatory Visit: Payer: Medicare Other | Admitting: Internal Medicine

## 2014-05-31 ENCOUNTER — Ambulatory Visit: Payer: Medicare Other | Attending: Internal Medicine | Admitting: Internal Medicine

## 2014-05-31 ENCOUNTER — Encounter: Payer: Self-pay | Admitting: Internal Medicine

## 2014-05-31 VITALS — BP 130/70 | HR 80 | Temp 98.0°F | Resp 16 | Wt 190.2 lb

## 2014-05-31 DIAGNOSIS — Z90710 Acquired absence of both cervix and uterus: Secondary | ICD-10-CM | POA: Diagnosis not present

## 2014-05-31 DIAGNOSIS — I10 Essential (primary) hypertension: Secondary | ICD-10-CM | POA: Diagnosis not present

## 2014-05-31 DIAGNOSIS — Z791 Long term (current) use of non-steroidal anti-inflammatories (NSAID): Secondary | ICD-10-CM | POA: Insufficient documentation

## 2014-05-31 DIAGNOSIS — E785 Hyperlipidemia, unspecified: Secondary | ICD-10-CM | POA: Diagnosis not present

## 2014-05-31 LAB — COMPLETE METABOLIC PANEL WITH GFR
ALK PHOS: 63 U/L (ref 39–117)
ALT: 25 U/L (ref 0–35)
AST: 26 U/L (ref 0–37)
Albumin: 4.1 g/dL (ref 3.5–5.2)
BUN: 14 mg/dL (ref 6–23)
CALCIUM: 10.1 mg/dL (ref 8.4–10.5)
CO2: 25 mEq/L (ref 19–32)
CREATININE: 0.96 mg/dL (ref 0.50–1.10)
Chloride: 110 mEq/L (ref 96–112)
GFR, Est African American: 76 mL/min
GFR, Est Non African American: 66 mL/min
GLUCOSE: 87 mg/dL (ref 70–99)
Potassium: 5 mEq/L (ref 3.5–5.3)
Sodium: 142 mEq/L (ref 135–145)
TOTAL PROTEIN: 7.7 g/dL (ref 6.0–8.3)
Total Bilirubin: 0.3 mg/dL (ref 0.2–1.2)

## 2014-05-31 LAB — LIPID PANEL
CHOL/HDL RATIO: 2.9 ratio
Cholesterol: 178 mg/dL (ref 0–200)
HDL: 61 mg/dL (ref >39)
LDL CALC: 100 mg/dL — AB (ref 0–99)
Triglycerides: 87 mg/dL (ref <150)
VLDL: 17 mg/dL (ref 0–40)

## 2014-05-31 NOTE — Progress Notes (Signed)
MRN: [id number] Name: Shannon [name]  Sex: female Age: 59 y.o. DOB: [birth year]  Allergies: Morphine and related  Chief Complaint  Patient presents with  . Follow-up    HPI: Patient is 59 y.o. female who history of hypertension, hyperlipidemia comes today for followup, she denies any acute symptoms, she is compliant with her medications, initially her blood pressure was elevated, manual blood pressure is 130/70, patient denies smoking cigarettes, denies any headache dizziness chest and shortness of breath.  Past Medical History  Diagnosis Date  . Hypertension     takes Lisinopril and Metoprolol daily  . Hyperlipidemia     takes Tricor daily  . Peripheral edema     takes lasix daily  . Dizziness     occasionally  . Numbness     stinging in right hand  . Ventral hernia     Past Surgical History  Procedure Laterality Date  . Abdominal hysterectomy    . Back surgery    . Fracture surgery      left leg  . Uterine fibroid surgery    . Colonscopy    . Ventral hernia repair N/A 05/24/2013    Procedure: OPEN REPAIR EPIGASTRIC VENTRAL HERNIA;  Surgeon: Imogene Burn. Georgette Dover, MD;  Location: [place];  Service: General;  Laterality: N/A;  . Insertion of mesh N/A 05/24/2013    Procedure: INSERTION OF MESH;  Surgeon: Imogene Burn. Georgette Dover, MD;  Location: [place];  Service: General;  Laterality: N/A;      Medication List       This list is accurate as of: 05/31/14  2:49 PM.  Always use your most recent med list.               cyclobenzaprine 10 MG tablet  Commonly known as:  FLEXERIL  Take 1 tablet (10 mg total) by mouth at bedtime.     fenofibrate 48 MG tablet  Commonly known as:  TRICOR  Take 1 tablet (48 mg total) by mouth daily.     lisinopril 20 MG tablet  Commonly known as:  PRINIVIL,ZESTRIL  Take 1 tablet (20 mg total) by mouth daily.     meloxicam 7.5 MG tablet  Commonly known as:  MOBIC  Take 1 tablet (7.5 mg total) by mouth daily.     metoprolol succinate 100 MG  24 hr tablet  Commonly known as:  TOPROL-XL  Take 1 tablet (100 mg total) by mouth daily. Take with or immediately following a meal.     oxyCODONE 5 MG immediate release tablet  Commonly known as:  Oxy IR/ROXICODONE  Take 1 tablet (5 mg total) by mouth every 6 (six) hours as needed for severe pain. May take an extra tablet when pain is sever. No More than 4 a day        No orders of the defined types were placed in this encounter.    Immunization History  Administered Date(s) Administered  . Influenza Whole 04/26/2009, 01/21/2010  . Influenza,inj,Quad PF,36+ Mos 02/16/2014  . Td 11/17/2007    History reviewed. No pertinent family history.  History  Substance Use Topics  . Smoking status: Never Smoker   . Smokeless tobacco: Not on file  . Alcohol Use: No    Review of Systems   As noted in HPI  Filed Vitals:   05/31/14 1448  BP: 130/70  Pulse:   Temp:   Resp:     Physical Exam  Physical Exam  Constitutional: No distress.  Eyes:  EOM are normal. Pupils are equal, round, and reactive to light.  Cardiovascular: Normal rate and regular rhythm.   Pulmonary/Chest: Breath sounds normal. No respiratory distress. She has no wheezes. She has no rales.  Musculoskeletal: She exhibits no edema.    CBC    Component Value Date/Time   WBC 4.6 06/14/2013 1422   RBC 5.92* 06/14/2013 1422   HGB 14.1 06/14/2013 1422   HCT 43.4 06/14/2013 1422   PLT 305 06/14/2013 1422   MCV 73.3* 06/14/2013 1422   LYMPHSABS 1.9 06/14/2013 1422   MONOABS 0.3 06/14/2013 1422   EOSABS 0.2 06/14/2013 1422   BASOSABS 0.0 06/14/2013 1422    CMP     Component Value Date/Time   NA 141 02/16/2014 0930   K 6.0* 02/16/2014 0930   CL 105 02/16/2014 0930   CO2 23 02/16/2014 0930   GLUCOSE 93 02/16/2014 0930   BUN 17 02/16/2014 0930   CREATININE 1.20* 02/16/2014 0930   CREATININE 1.22* 05/22/2013 1512   CALCIUM 10.1 02/16/2014 0930   PROT 8.2 02/16/2014 0930   ALBUMIN 4.1 02/16/2014 0930     AST 25 02/16/2014 0930   ALT 21 02/16/2014 0930   ALKPHOS 63 02/16/2014 0930   BILITOT 0.6 02/16/2014 0930   GFRNONAA 51* 02/16/2014 0930   GFRNONAA 49* 05/22/2013 1512   GFRAA 58* 02/16/2014 0930   GFRAA 57* 05/22/2013 1512    Lab Results  Component Value Date/Time   CHOL 197 06/14/2013 02:22 PM    No components found for: HGA1C  Lab Results  Component Value Date/Time   AST 25 02/16/2014 09:30 AM    Assessment and Plan  Essential hypertension, benign - Plan: blood pressure is well controlled, continue with lisinopril and metoprolol. Will repeat blood chemistry.COMPLETE METABOLIC PANEL WITH GFR  Dyslipidemia - Plan: currently patient is on TriCor, will repeat Lipid panel   Health Maintenance -Colonoscopy: uptodate -Mammogram: uptodate  -Vaccinations:  uptodate with flu shot   Return in about 4 months (around 09/29/2014) for hypertension, hyperipidemia.   This note has been created with Surveyor, quantity. Any transcriptional errors are unintentional.    Lorayne Marek, MD

## 2014-05-31 NOTE — Progress Notes (Signed)
Patient here for follow up on her HTN Patient does not need refills at this time

## 2014-05-31 NOTE — Patient Instructions (Signed)
DASH Eating Plan °DASH stands for "Dietary Approaches to Stop Hypertension." The DASH eating plan is a healthy eating plan that has been shown to reduce high blood pressure (hypertension). Additional health benefits may include reducing the risk of type 2 diabetes mellitus, heart disease, and stroke. The DASH eating plan may also help with weight loss. °WHAT DO I NEED TO KNOW ABOUT THE DASH EATING PLAN? °For the DASH eating plan, you will follow these general guidelines: °· Choose foods with a percent daily value for sodium of less than 5% (as listed on the food label). °· Use salt-free seasonings or herbs instead of table salt or sea salt. °· Check with your health care provider or pharmacist before using salt substitutes. °· Eat lower-sodium products, often labeled as "lower sodium" or "no salt added." °· Eat fresh foods. °· Eat more vegetables, fruits, and low-fat dairy products. °· Choose whole grains. Look for the word "whole" as the first word in the ingredient list. °· Choose fish and skinless chicken or turkey more often than red meat. Limit fish, poultry, and meat to 6 oz (170 g) each day. °· Limit sweets, desserts, sugars, and sugary drinks. °· Choose heart-healthy fats. °· Limit cheese to 1 oz (28 g) per day. °· Eat more home-cooked food and less restaurant, buffet, and fast food. °· Limit fried foods. °· Cook foods using methods other than frying. °· Limit canned vegetables. If you do use them, rinse them well to decrease the sodium. °· When eating at a restaurant, ask that your food be prepared with less salt, or no salt if possible. °WHAT FOODS CAN I EAT? °Seek help from a dietitian for individual calorie needs. °Grains °Whole grain or whole wheat bread. Brown rice. Whole grain or whole wheat pasta. Quinoa, bulgur, and whole grain cereals. Low-sodium cereals. Corn or whole wheat flour tortillas. Whole grain cornbread. Whole grain crackers. Low-sodium crackers. °Vegetables °Fresh or frozen vegetables  (raw, steamed, roasted, or grilled). Low-sodium or reduced-sodium tomato and vegetable juices. Low-sodium or reduced-sodium tomato sauce and paste. Low-sodium or reduced-sodium canned vegetables.  °Fruits °All fresh, canned (in natural juice), or frozen fruits. °Meat and Other Protein Products °Ground beef (85% or leaner), grass-fed beef, or beef trimmed of fat. Skinless chicken or turkey. Ground chicken or turkey. Pork trimmed of fat. All fish and seafood. Eggs. Dried beans, peas, or lentils. Unsalted nuts and seeds. Unsalted canned beans. °Dairy °Low-fat dairy products, such as skim or 1% milk, 2% or reduced-fat cheeses, low-fat ricotta or cottage cheese, or plain low-fat yogurt. Low-sodium or reduced-sodium cheeses. °Fats and Oils °Tub margarines without trans fats. Light or reduced-fat mayonnaise and salad dressings (reduced sodium). Avocado. Safflower, olive, or canola oils. Natural peanut or almond butter. °Other °Unsalted popcorn and pretzels. °The items listed above may not be a complete list of recommended foods or beverages. Contact your dietitian for more options. °WHAT FOODS ARE NOT RECOMMENDED? °Grains °White bread. White pasta. White rice. Refined cornbread. Bagels and croissants. Crackers that contain trans fat. °Vegetables °Creamed or fried vegetables. Vegetables in a cheese sauce. Regular canned vegetables. Regular canned tomato sauce and paste. Regular tomato and vegetable juices. °Fruits °Dried fruits. Canned fruit in light or heavy syrup. Fruit juice. °Meat and Other Protein Products °Fatty cuts of meat. Ribs, chicken wings, bacon, sausage, bologna, salami, chitterlings, fatback, hot dogs, bratwurst, and packaged luncheon meats. Salted nuts and seeds. Canned beans with salt. °Dairy °Whole or 2% milk, cream, half-and-half, and cream cheese. Whole-fat or sweetened yogurt. Full-fat   cheeses or blue cheese. Nondairy creamers and whipped toppings. Processed cheese, cheese spreads, or cheese  curds. °Condiments °Onion and garlic salt, seasoned salt, table salt, and sea salt. Canned and packaged gravies. Worcestershire sauce. Tartar sauce. Barbecue sauce. Teriyaki sauce. Soy sauce, including reduced sodium. Steak sauce. Fish sauce. Oyster sauce. Cocktail sauce. Horseradish. Ketchup and mustard. Meat flavorings and tenderizers. Bouillon cubes. Hot sauce. Tabasco sauce. Marinades. Taco seasonings. Relishes. °Fats and Oils °Butter, stick margarine, lard, shortening, ghee, and bacon fat. Coconut, palm kernel, or palm oils. Regular salad dressings. °Other °Pickles and olives. Salted popcorn and pretzels. °The items listed above may not be a complete list of foods and beverages to avoid. Contact your dietitian for more information. °WHERE CAN I FIND MORE INFORMATION? °National Heart, Lung, and [employer] Institute: www.nhlbi.nih.gov/health/health-topics/topics/dash/ °Document Released: 03/19/2011 Document Revised: 08/14/2013 Document Reviewed: 02/01/2013 °ExitCare® Patient Information ©2015 [employer]. This information is not intended to replace advice given to you by your health care provider. Make sure you discuss any questions you have with your health care provider. ° °

## 2014-06-07 ENCOUNTER — Telehealth: Payer: Self-pay

## 2014-06-07 NOTE — Telephone Encounter (Signed)
-----   Message from Lorayne Marek, MD sent at 06/01/2014 12:27 PM EST ----- Blood work reviewed, call and let the patient know that her potassium level and kidney function is improved and is in normal range.

## 2014-06-07 NOTE — Telephone Encounter (Signed)
Patient is aware of her lab results 

## 2014-06-19 ENCOUNTER — Telehealth: Payer: Self-pay

## 2014-06-19 ENCOUNTER — Other Ambulatory Visit: Payer: Self-pay | Admitting: Internal Medicine

## 2014-06-19 NOTE — Telephone Encounter (Signed)
Returned patient phone call Patient not available Unable to leave message-mailbox was full

## 2014-06-20 ENCOUNTER — Telehealth: Payer: Self-pay

## 2014-06-20 ENCOUNTER — Other Ambulatory Visit: Payer: Self-pay

## 2014-06-20 MED ORDER — METOPROLOL SUCCINATE ER 100 MG PO TB24: 100.0000 mg | ORAL_TABLET | Freq: Every day | ORAL | Status: DC

## 2014-06-20 MED ORDER — FENOFIBRATE 48 MG PO TABS: 48.0000 mg | ORAL_TABLET | Freq: Every day | ORAL | Status: DC

## 2014-06-20 NOTE — Telephone Encounter (Signed)
Patient called requesting refill on her metoprolol and fenovibrate Prescription [facility] to [facility]

## 2014-06-21 ENCOUNTER — Encounter: Payer: Self-pay | Admitting: Registered Nurse

## 2014-06-21 ENCOUNTER — Encounter: Payer: Medicare Other | Attending: Physical Medicine & Rehabilitation | Admitting: Registered Nurse

## 2014-06-21 VITALS — BP 151/94 | HR 93 | Resp 14

## 2014-06-21 DIAGNOSIS — M961 Postlaminectomy syndrome, not elsewhere classified: Secondary | ICD-10-CM

## 2014-06-21 DIAGNOSIS — R269 Unspecified abnormalities of gait and mobility: Secondary | ICD-10-CM | POA: Diagnosis not present

## 2014-06-21 DIAGNOSIS — Z79891 Long term (current) use of opiate analgesic: Secondary | ICD-10-CM | POA: Insufficient documentation

## 2014-06-21 DIAGNOSIS — G894 Chronic pain syndrome: Secondary | ICD-10-CM | POA: Insufficient documentation

## 2014-06-21 DIAGNOSIS — R200 Anesthesia of skin: Secondary | ICD-10-CM | POA: Insufficient documentation

## 2014-06-21 DIAGNOSIS — M47817 Spondylosis without myelopathy or radiculopathy, lumbosacral region: Secondary | ICD-10-CM | POA: Diagnosis not present

## 2014-06-21 DIAGNOSIS — Z79899 Other long term (current) drug therapy: Secondary | ICD-10-CM

## 2014-06-21 DIAGNOSIS — Z5181 Encounter for therapeutic drug level monitoring: Secondary | ICD-10-CM | POA: Diagnosis not present

## 2014-06-21 DIAGNOSIS — M7061 Trochanteric bursitis, right hip: Secondary | ICD-10-CM | POA: Diagnosis not present

## 2014-06-21 MED ORDER — OXYCODONE HCL 5 MG PO TABS: 5.0000 mg | ORAL_TABLET | Freq: Four times a day (QID) | ORAL | Status: DC | PRN

## 2014-06-21 NOTE — Progress Notes (Signed)
Subjective:    Patient ID: Shannon [name], female    DOB: [birth year], 57 y.o.   MRN: [id number]  HPI: Shannon [name] is a 59 year old female who returns for follow up for chronic pain and medication refill. She says her pain is located in her neck, lower back and right hip. She rates her pain 7. Her current exercise regime is performing stretching exercises, sit-ups,and leg lifts.  Pain Inventory Average Pain 6 Pain Right Now 7 My pain is stabbing  In the last 24 hours, has pain interfered with the following? General activity 4 Relation with others 4 Enjoyment of life 5 What TIME of day is your pain at its worst? evening Sleep (in general) Fair  Pain is worse with: walking, bending, standing and some activites Pain improves with: heat/ice and medication Relief from Meds: 5  Mobility walk with assistance use a cane how many minutes can you walk? 20 ability to climb steps?  yes do you drive?  no transfers alone Do you have any goals in this area?  no  Function not employed: date last employed 2008 disabled: date disabled 2010 I need assistance with the following:  household duties and shopping Do you have any goals in this area?  no  Neuro/Psych numbness tingling  Prior Studies Any changes since last visit?  no  Physicians involved in your care Any changes since last visit?  no   History reviewed. No pertinent family history. History   Social History  . Marital Status: Legally Separated    Spouse Name: N/A  . Number of Children: N/A  . Years of Education: N/A   Social History Main Topics  . Smoking status: Never Smoker   . Smokeless tobacco: Not on file  . Alcohol Use: No  . Drug Use: No  . Sexual Activity: Yes    Birth Control/ Protection: Surgical   Other Topics Concern  . None   Social History Narrative   ** Merged History Encounter **       Past Surgical History  Procedure Laterality Date  . Abdominal hysterectomy    . Back  surgery    . Fracture surgery      left leg  . Uterine fibroid surgery    . Colonscopy    . Ventral hernia repair N/A 05/24/2013    Procedure: OPEN REPAIR EPIGASTRIC VENTRAL HERNIA;  Surgeon: Imogene Burn. Georgette Dover, MD;  Location: [place];  Service: General;  Laterality: N/A;  . Insertion of mesh N/A 05/24/2013    Procedure: INSERTION OF MESH;  Surgeon: Imogene Burn. Georgette Dover, MD;  Location: [facility];  Service: General;  Laterality: N/A;   Past Medical History  Diagnosis Date  . Hypertension     takes Lisinopril and Metoprolol daily  . Hyperlipidemia     takes Tricor daily  . Peripheral edema     takes lasix daily  . Dizziness     occasionally  . Numbness     stinging in right hand  . Ventral hernia    There were no vitals taken for this visit.  Opioid Risk Score:   Fall Risk Score: Moderate Fall Risk (6-13 points) (patient declined pamphlet citing no need)  Review of Systems     Objective:   Physical Exam  Constitutional: She appears well-developed and well-nourished.  HENT:  Head: Normocephalic and atraumatic.  Neck: Normal range of motion. Neck supple.  Cervical Paraspinal Tenderness: C-3- C-5  Cardiovascular: Normal rate and regular rhythm.  Pulmonary/Chest: Effort normal and breath sounds normal.  Musculoskeletal:  Normal Muscle Bulk and Muscle Testing Reveals: Upper Extremities: Full ROM and Muscle Strength 5/5 Lumbar Paraspinal Tenderness: L-3-L-5 Right Greater Trochanteric Tenderness Lower Extremities: Full ROM and Muscle Strength 5/5 Arises from chair with ease/ Using straight cane for support  Neurological: She is alert.  Skin: Skin is warm and dry.  Psychiatric: She has a normal mood and affect.  Nursing note and vitals reviewed.         Assessment & Plan:  1. Lumbar postlaminectomy syndrome with chronic lumbar pain:  Refilled: Oxycodone IR 5 mg one tablet every 6 hours as needed #100. May take an extra Tablet when Pain is sever No More than 4 a day 2. Right  Greater Trochanteric Tenderness: Continue with heat/ice therapy 15 minutes of face to face patient care time was spent during this visit all questions were encouraged and answered.   F/U in 1 month

## 2014-06-25 ENCOUNTER — Other Ambulatory Visit: Payer: Self-pay | Admitting: Emergency Medicine

## 2014-06-25 MED ORDER — METOPROLOL SUCCINATE ER 100 MG PO TB24: 100.0000 mg | ORAL_TABLET | Freq: Every day | ORAL | Status: DC

## 2014-07-03 ENCOUNTER — Telehealth: Payer: Self-pay | Admitting: *Deleted

## 2014-07-03 NOTE — Telephone Encounter (Signed)
Shannon [name] called to let Shannon [name] know that she has been applying icy hot and using heat on her hip and it is much better.

## 2014-07-19 ENCOUNTER — Encounter: Payer: Medicare Other | Attending: Physical Medicine & Rehabilitation | Admitting: Registered Nurse

## 2014-07-19 ENCOUNTER — Other Ambulatory Visit: Payer: Self-pay | Admitting: Physical Medicine & Rehabilitation

## 2014-07-19 ENCOUNTER — Encounter: Payer: Self-pay | Admitting: Registered Nurse

## 2014-07-19 VITALS — BP 141/102 | HR 80 | Resp 14

## 2014-07-19 DIAGNOSIS — Z79899 Other long term (current) drug therapy: Secondary | ICD-10-CM

## 2014-07-19 DIAGNOSIS — R269 Unspecified abnormalities of gait and mobility: Secondary | ICD-10-CM | POA: Diagnosis not present

## 2014-07-19 DIAGNOSIS — M961 Postlaminectomy syndrome, not elsewhere classified: Secondary | ICD-10-CM | POA: Insufficient documentation

## 2014-07-19 DIAGNOSIS — Z5181 Encounter for therapeutic drug level monitoring: Secondary | ICD-10-CM | POA: Diagnosis not present

## 2014-07-19 DIAGNOSIS — M47817 Spondylosis without myelopathy or radiculopathy, lumbosacral region: Secondary | ICD-10-CM | POA: Diagnosis not present

## 2014-07-19 DIAGNOSIS — Z79891 Long term (current) use of opiate analgesic: Secondary | ICD-10-CM | POA: Diagnosis not present

## 2014-07-19 DIAGNOSIS — G894 Chronic pain syndrome: Secondary | ICD-10-CM | POA: Diagnosis not present

## 2014-07-19 DIAGNOSIS — R200 Anesthesia of skin: Secondary | ICD-10-CM | POA: Insufficient documentation

## 2014-07-19 MED ORDER — OXYCODONE HCL 5 MG PO TABS: 5.0000 mg | ORAL_TABLET | Freq: Four times a day (QID) | ORAL | Status: DC | PRN

## 2014-07-19 NOTE — Progress Notes (Signed)
Subjective:    Patient ID: Shannon [name], female    DOB: 1955-10-06, 59 y.o.   MRN: [id number]  HPI: Ms. Shannon [name] is a 59 year old female who returns for follow up for chronic pain and medication refill. She says her pain is located in her lower back. She rates her pain 6. Her current exercise regime is performing stretching exercises, sit-ups,and leg lifts. She arrived to office hypertensive blood pressure re-checked 144/93.  Pain Inventory Average Pain 4 Pain Right Now 6 My pain is constant, burning and stabbing  In the last 24 hours, has pain interfered with the following? General activity 4 Relation with others 3 Enjoyment of life 5 What TIME of day is your pain at its worst? evening Sleep (in general) Fair  Pain is worse with: walking, bending, standing and some activites Pain improves with: rest, heat/ice and medication Relief from Meds: 4  Mobility walk without assistance walk with assistance use a cane how many minutes can you walk? 20 ability to climb steps?  yes do you drive?  no transfers alone Do you have any goals in this area?  no  Function disabled: date disabled 2010 I need assistance with the following:  household duties and shopping Do you have any goals in this area?  no  Neuro/Psych numbness tingling trouble walking  Prior Studies Any changes since last visit?  no  Physicians involved in your care Any changes since last visit?  no   History reviewed. No pertinent family history. History   Social History  . Marital Status: Legally Separated    Spouse Name: N/A  . Number of Children: N/A  . Years of Education: N/A   Social History Main Topics  . Smoking status: Never Smoker   . Smokeless tobacco: Not on file  . Alcohol Use: No  . Drug Use: No  . Sexual Activity: Yes    Birth Control/ Protection: Surgical   Other Topics Concern  . None   Social History Narrative   ** Merged History Encounter **       Past  Surgical History  Procedure Laterality Date  . Abdominal hysterectomy    . Back surgery    . Fracture surgery      left leg  . Uterine fibroid surgery    . Colonscopy    . Ventral hernia repair N/A 05/24/2013    Procedure: OPEN REPAIR EPIGASTRIC VENTRAL HERNIA;  Surgeon: Imogene Burn. Georgette Dover, MD;  Location: [facility];  Service: General;  Laterality: N/A;  . Insertion of mesh N/A 05/24/2013    Procedure: INSERTION OF MESH;  Surgeon: Imogene Burn. Georgette Dover, MD;  Location: [facility];  Service: General;  Laterality: N/A;   Past Medical History  Diagnosis Date  . Hypertension     takes Lisinopril and Metoprolol daily  . Hyperlipidemia     takes Tricor daily  . Peripheral edema     takes lasix daily  . Dizziness     occasionally  . Numbness     stinging in right hand  . Ventral hernia    BP 141/102 mmHg  Pulse 80  Resp 14  SpO2 96%  Opioid Risk Score:   Fall Risk Score: Low Fall Risk (0-5 points)`1  Depression screen PHQ 2/9  Depression screen PHQ 2/9 06/21/2014  Decreased Interest 2  Down, Depressed, Hopeless 1  PHQ - 2 Score 3  Altered sleeping 1  Tired, decreased energy 3  Change in appetite 0  Feeling bad  or failure about yourself  2  Trouble concentrating 1  Moving slowly or fidgety/restless 2  Suicidal thoughts 0  PHQ-9 Score 12     Review of Systems  Constitutional:       Night sweats  Gastrointestinal: Positive for diarrhea.  Musculoskeletal: Positive for gait problem.  Neurological: Positive for numbness.       Tingling  All other systems reviewed and are negative.      Objective:   Physical Exam  Constitutional: She is oriented to person, place, and time. She appears well-developed and well-nourished.  HENT:  Head: Normocephalic and atraumatic.  Neck: Normal range of motion. Neck supple.  Cardiovascular: Normal rate and regular rhythm.   Pulmonary/Chest: Effort normal and breath sounds normal.  Musculoskeletal:  Normal Muscle Bulk and Muscle Testing  Reveals: Upper Extremities: Full ROM and Muscle strength 5/5 Lumbar Paraspinal tenderness: L-3- L-4 Lower Extremities: Full ROM and Muscle Strength 5/5 Arises from chair with ease Narrow Based gait  Neurological: She is alert and oriented to person, place, and time.  Skin: Skin is warm and dry.  Psychiatric: She has a normal mood and affect.  Nursing note and vitals reviewed.         Assessment & Plan:  1. Lumbar postlaminectomy syndrome with chronic lumbar pain:  Refilled: Oxycodone IR 5 mg one tablet every 6 hours as needed #100. May take an extra Tablet when Pain is sever No More than 4 a day  15 minutes of face to face patient care time was spent during this visit all questions were encouraged and answered.   F/U in 1 month

## 2014-07-21 LAB — PMP ALCOHOL METABOLITE (ETG)

## 2014-07-25 LAB — OXYCODONE, URINE (LC/MS-MS)
Noroxycodone, Ur: 398 ng/mL (ref <50)
OXYCODONE, UR: 351 ng/mL (ref <50)
OXYMORPHONE, URINE: 51 ng/mL (ref <50)

## 2014-07-25 LAB — ETHYL GLUCURONIDE, URINE
ETGU: 15794 ng/mL — AB (ref <500)
Ethyl Sulfate (ETS): 9254 ng/mL — ABNORMAL HIGH (ref <100)

## 2014-07-25 LAB — COCAINE METABOLITE (GC/LC/MS), URINE: BENZOYLECGONINE GC/MS CONF: 245 ng/mL — AB (ref <100)

## 2014-07-26 LAB — PRESCRIPTION MONITORING PROFILE (SOLSTAS)
Amphetamine/Meth: NEGATIVE ng/mL
BENZODIAZEPINE SCREEN, URINE: NEGATIVE ng/mL
Barbiturate Screen, Urine: NEGATIVE ng/mL
Buprenorphine, Urine: NEGATIVE ng/mL
CARISOPRODOL, URINE: NEGATIVE ng/mL
CREATININE, URINE: 605.27 mg/dL (ref >20.0)
Cannabinoid Scrn, Ur: NEGATIVE ng/mL
Fentanyl, Ur: NEGATIVE ng/mL
MDMA URINE: NEGATIVE ng/mL
METHADONE SCREEN, URINE: NEGATIVE ng/mL
Meperidine, Ur: NEGATIVE ng/mL
Nitrites, Initial: NEGATIVE ug/mL
Opiate Screen, Urine: NEGATIVE ng/mL
PROPOXYPHENE: NEGATIVE ng/mL
Tapentadol, urine: NEGATIVE ng/mL
Tramadol Scrn, Ur: NEGATIVE ng/mL
Zolpidem, Urine: NEGATIVE ng/mL
pH, Initial: 6.1 pH (ref 4.5–8.9)

## 2014-07-27 ENCOUNTER — Other Ambulatory Visit: Payer: Self-pay | Admitting: Internal Medicine

## 2014-07-30 ENCOUNTER — Telehealth: Payer: Self-pay

## 2014-07-30 MED ORDER — MELOXICAM 7.5 MG PO TABS: 7.5000 mg | ORAL_TABLET | Freq: Every day | ORAL | Status: DC

## 2014-07-30 NOTE — Telephone Encounter (Signed)
Patient calling requesting a refill on her meloxicam Prescription sent to [facility] on file

## 2014-08-01 NOTE — Progress Notes (Signed)
Urine drug screen for this encounter is positive for cocaine and alcohol

## 2014-08-03 ENCOUNTER — Telehealth: Payer: Self-pay | Admitting: *Deleted

## 2014-08-03 NOTE — Telephone Encounter (Signed)
I called and spoke with Shannon [name] to notify her that her urine drug screen revealed cocaine and alcohol use and that we will be discharging her from the clinic.  She replied "ok" A letter will be mailed and a pamphlet for the [facility] will be included as well as area pain clinics.Marland Kitchen

## 2014-08-15 ENCOUNTER — Ambulatory Visit: Payer: Medicare Other | Admitting: Registered Nurse

## 2014-08-31 ENCOUNTER — Other Ambulatory Visit: Payer: Self-pay | Admitting: Internal Medicine

## 2014-09-11 ENCOUNTER — Other Ambulatory Visit: Payer: Self-pay | Admitting: Internal Medicine

## 2014-09-12 ENCOUNTER — Telehealth: Payer: Self-pay

## 2014-09-12 ENCOUNTER — Other Ambulatory Visit: Payer: Self-pay | Admitting: Internal Medicine

## 2014-09-12 MED ORDER — CYCLOBENZAPRINE HCL 10 MG PO TABS: 10.0000 mg | ORAL_TABLET | Freq: Every day | ORAL | Status: DC

## 2014-09-12 MED ORDER — FENOFIBRATE 48 MG PO TABS: 48.0000 mg | ORAL_TABLET | Freq: Every day | ORAL | Status: DC

## 2014-09-12 NOTE — Telephone Encounter (Signed)
Patient called requesting refills on her flexeril and fenofibrate Prescriptions sent to [facility] on file

## 2014-09-13 ENCOUNTER — Telehealth: Payer: Self-pay

## 2014-09-13 NOTE — Telephone Encounter (Signed)
Returned patient phone call Patient not available Message states at the subscribers request this phone does not accept incoming calls

## 2014-09-14 ENCOUNTER — Other Ambulatory Visit: Payer: Self-pay

## 2014-09-14 MED ORDER — LISINOPRIL 20 MG PO TABS: 20.0000 mg | ORAL_TABLET | Freq: Every day | ORAL | Status: DC

## 2014-09-27 ENCOUNTER — Ambulatory Visit: Payer: Medicare Other | Attending: Internal Medicine | Admitting: Internal Medicine

## 2014-09-27 ENCOUNTER — Encounter: Payer: Self-pay | Admitting: Internal Medicine

## 2014-09-27 VITALS — BP 130/80 | HR 92 | Temp 98.0°F | Resp 16 | Wt 191.0 lb

## 2014-09-27 DIAGNOSIS — G8929 Other chronic pain: Secondary | ICD-10-CM | POA: Insufficient documentation

## 2014-09-27 DIAGNOSIS — M545 Low back pain: Secondary | ICD-10-CM

## 2014-09-27 DIAGNOSIS — Z791 Long term (current) use of non-steroidal anti-inflammatories (NSAID): Secondary | ICD-10-CM | POA: Insufficient documentation

## 2014-09-27 DIAGNOSIS — E785 Hyperlipidemia, unspecified: Secondary | ICD-10-CM | POA: Insufficient documentation

## 2014-09-27 DIAGNOSIS — I10 Essential (primary) hypertension: Secondary | ICD-10-CM | POA: Insufficient documentation

## 2014-09-27 LAB — COMPLETE METABOLIC PANEL WITH GFR
ALT: 20 U/L (ref 0–35)
AST: 19 U/L (ref 0–37)
Albumin: 4.1 g/dL (ref 3.5–5.2)
Alkaline Phosphatase: 58 U/L (ref 39–117)
BUN: 18 mg/dL (ref 6–23)
CALCIUM: 10 mg/dL (ref 8.4–10.5)
CHLORIDE: 111 meq/L (ref 96–112)
CO2: 23 mEq/L (ref 19–32)
CREATININE: 1.13 mg/dL — AB (ref 0.50–1.10)
GFR, Est African American: 63 mL/min
GFR, Est Non African American: 54 mL/min — ABNORMAL LOW
Glucose, Bld: 103 mg/dL — ABNORMAL HIGH (ref 70–99)
Potassium: 5.2 mEq/L (ref 3.5–5.3)
Sodium: 143 mEq/L (ref 135–145)
Total Bilirubin: 0.5 mg/dL (ref 0.2–1.2)
Total Protein: 7.8 g/dL (ref 6.0–8.3)

## 2014-09-27 NOTE — Progress Notes (Signed)
Patient here for follow up on her hypertension and back pain Patient is requesting a referral to pain management as well

## 2014-09-27 NOTE — Progress Notes (Signed)
MRN: [id number] Name: Shannon [name]  Sex: female Age: 59 y.o. DOB: 05-23-1955  Allergies: Morphine and related  Chief Complaint  Patient presents with  . Follow-up    HPI: Patient is 59 y.o. female who history of hyperlipidemia, hypertension comes today for followup as per patient she has taken her blood pressure medication and her manual blood pressure is 130/80, she denies any headache dizziness chest Benjamine Mola, she has history of chronic lower back pain has been following up with physical medicine/rehabilitation, apparently her urine drug screen was inconsistent and was advised to be seen by pain management today she's requesting referral.  Past Medical History  Diagnosis Date  . Hypertension     takes Lisinopril and Metoprolol daily  . Hyperlipidemia     takes Tricor daily  . Peripheral edema     takes lasix daily  . Dizziness     occasionally  . Numbness     stinging in right hand  . Ventral hernia     Past Surgical History  Procedure Laterality Date  . Abdominal hysterectomy    . Back surgery    . Fracture surgery      left leg  . Uterine fibroid surgery    . Colonscopy    . Ventral hernia repair N/A 05/24/2013    Procedure: OPEN REPAIR EPIGASTRIC VENTRAL HERNIA;  Surgeon: Imogene Burn. Georgette Dover, MD;  Location: [facility];  Service: General;  Laterality: N/A;  . Insertion of mesh N/A 05/24/2013    Procedure: INSERTION OF MESH;  Surgeon: Imogene Burn. Georgette Dover, MD;  Location: [facility];  Service: General;  Laterality: N/A;      Medication List       This list is accurate as of: 09/27/14 12:46 PM.  Always use your most recent med list.               cyclobenzaprine 10 MG tablet  Commonly known as:  FLEXERIL  Take 1 tablet (10 mg total) by mouth at bedtime.     fenofibrate 48 MG tablet  Commonly known as:  TRICOR  Take 1 tablet (48 mg total) by mouth daily.     lisinopril 20 MG tablet  Commonly known as:  PRINIVIL,ZESTRIL  Take 1 tablet (20 mg total) by mouth  daily.     meloxicam 7.5 MG tablet  Commonly known as:  MOBIC  Take 1 tablet (7.5 mg total) by mouth daily.     metoprolol succinate 100 MG 24 hr tablet  Commonly known as:  TOPROL-XL  Take 1 tablet (100 mg total) by mouth daily. Take with or immediately following a meal.     metoprolol succinate 100 MG 24 hr tablet  Commonly known as:  TOPROL-XL  TAKE 1 TABLET BY MOUTH DAILY WITH OR IMMEDIATELY FOLLOWING A MEAL     oxyCODONE 5 MG immediate release tablet  Commonly known as:  Oxy IR/ROXICODONE  Take 1 tablet (5 mg total) by mouth every 6 (six) hours as needed for severe pain. May take an extra tablet when pain is sever. No More than 4 a day        No orders of the defined types were placed in this encounter.    Immunization History  Administered Date(s) Administered  . Influenza Whole 04/26/2009, 01/21/2010  . Influenza,inj,Quad PF,36+ Mos 02/16/2014  . Td 11/17/2007    History reviewed. No pertinent family history.  History  Substance Use Topics  . Smoking status: Never Smoker   . Smokeless tobacco:  Not on file  . Alcohol Use: No    Review of Systems   As noted in HPI  Filed Vitals:   09/27/14 1246  BP: 130/80  Pulse:   Temp:   Resp:     Physical Exam  Physical Exam  Constitutional: No distress.  Eyes: EOM are normal. Pupils are equal, round, and reactive to light.  Cardiovascular: Normal rate and regular rhythm.   Pulmonary/Chest: Breath sounds normal. No respiratory distress. She has no wheezes. She has no rales.    CBC    Component Value Date/Time   WBC 4.6 06/14/2013 1422   RBC 5.92* 06/14/2013 1422   HGB 14.1 06/14/2013 1422   HCT 43.4 06/14/2013 1422   PLT 305 06/14/2013 1422   MCV 73.3* 06/14/2013 1422   LYMPHSABS 1.9 06/14/2013 1422   MONOABS 0.3 06/14/2013 1422   EOSABS 0.2 06/14/2013 1422   BASOSABS 0.0 06/14/2013 1422    CMP     Component Value Date/Time   NA 142 05/31/2014 1457   K 5.0 05/31/2014 1457   CL 110 05/31/2014  1457   CO2 25 05/31/2014 1457   GLUCOSE 87 05/31/2014 1457   BUN 14 05/31/2014 1457   CREATININE 0.96 05/31/2014 1457   CREATININE 1.22* 05/22/2013 1512   CALCIUM 10.1 05/31/2014 1457   PROT 7.7 05/31/2014 1457   ALBUMIN 4.1 05/31/2014 1457   AST 26 05/31/2014 1457   ALT 25 05/31/2014 1457   ALKPHOS 63 05/31/2014 1457   BILITOT 0.3 05/31/2014 1457   GFRNONAA 66 05/31/2014 1457   GFRNONAA 49* 05/22/2013 1512   GFRAA 76 05/31/2014 1457   GFRAA 57* 05/22/2013 1512    Lab Results  Component Value Date/Time   CHOL 178 05/31/2014 02:57 PM    Lab Results  Component Value Date/Time   HGBA1C 5.4 06/14/2013 02:46 PM    Lab Results  Component Value Date/Time   AST 26 05/31/2014 02:57 PM    Assessment and Plan  Essential hypertension, benign - Plan:blood pressure is controlled continue with current meds, repeat blood chemistry  COMPLETE METABOLIC PANEL WITH GFR  Chronic lower back pain - Plan: Ambulatory referral to Pain Clinic  Dyslipidemia Last blood work reviewed with the patient cholesterol is improved, she'll continue with low-fat diet as well as TriCor.  Return in about 3 months (around 12/28/2014), or if symptoms worsen or fail to improve.   This note has been created with Surveyor, quantity. Any transcriptional errors are unintentional.    Lorayne Marek, MD

## 2014-09-27 NOTE — Patient Instructions (Signed)

## 2014-09-28 ENCOUNTER — Telehealth: Payer: Self-pay

## 2014-09-28 NOTE — Telephone Encounter (Signed)
-----   Message from Lorayne Marek, MD sent at 09/28/2014 11:40 AM EDT ----- Blood work reviewed, call and let the patient know that her creatinine is borderline  elevated, advise patient to drink plenty of water to prevent dehydration, also avoid taking any over-the-counter NSAIDs including Aleve, ibuprofen, will repeat blood chemistry on the following visit.

## 2014-09-28 NOTE — Telephone Encounter (Signed)
Patient is aware of her lab results 

## 2014-10-16 ENCOUNTER — Ambulatory Visit: Payer: Medicare Other | Admitting: Physical Medicine & Rehabilitation

## 2014-10-17 ENCOUNTER — Other Ambulatory Visit: Payer: Self-pay | Admitting: Internal Medicine

## 2014-10-24 ENCOUNTER — Telehealth: Payer: Self-pay | Admitting: General Practice

## 2014-10-24 ENCOUNTER — Other Ambulatory Visit: Payer: Self-pay | Admitting: Internal Medicine

## 2014-10-24 ENCOUNTER — Telehealth: Payer: Self-pay

## 2014-10-24 MED ORDER — LISINOPRIL 20 MG PO TABS: 20.0000 mg | ORAL_TABLET | Freq: Every day | ORAL | Status: DC

## 2014-10-24 NOTE — Telephone Encounter (Signed)
Patient calling to request medication refill for: meloxicam (MOBIC) 7.5 MG tablet ,    fenofibrate (TRICOR) 48 MG tablet Please assist

## 2014-10-24 NOTE — Telephone Encounter (Signed)
Patient uses [facility] representative

## 2014-10-24 NOTE — Telephone Encounter (Signed)
Returned patient phone call Patient not available Left message on voice mail to return our call 

## 2014-11-24 DIAGNOSIS — R079 Chest pain, unspecified: Secondary | ICD-10-CM | POA: Diagnosis not present

## 2015-01-03 ENCOUNTER — Ambulatory Visit: Payer: Medicare Other | Attending: Family Medicine | Admitting: Family Medicine

## 2015-01-03 ENCOUNTER — Encounter: Payer: Self-pay | Admitting: Family Medicine

## 2015-01-03 VITALS — BP 157/88 | HR 58 | Temp 98.6°F | Resp 16 | Ht 65.0 in | Wt 187.0 lb

## 2015-01-03 DIAGNOSIS — G8929 Other chronic pain: Secondary | ICD-10-CM

## 2015-01-03 DIAGNOSIS — I10 Essential (primary) hypertension: Secondary | ICD-10-CM | POA: Diagnosis not present

## 2015-01-03 DIAGNOSIS — Z0000 Encounter for general adult medical examination without abnormal findings: Secondary | ICD-10-CM | POA: Diagnosis not present

## 2015-01-03 DIAGNOSIS — E785 Hyperlipidemia, unspecified: Secondary | ICD-10-CM | POA: Diagnosis not present

## 2015-01-03 DIAGNOSIS — M545 Low back pain: Secondary | ICD-10-CM

## 2015-01-03 DIAGNOSIS — Z23 Encounter for immunization: Secondary | ICD-10-CM | POA: Diagnosis not present

## 2015-01-03 MED ORDER — METOPROLOL SUCCINATE ER 100 MG PO TB24: 100.0000 mg | ORAL_TABLET | Freq: Every day | ORAL | Status: DC

## 2015-01-03 MED ORDER — LISINOPRIL 40 MG PO TABS: 40.0000 mg | ORAL_TABLET | Freq: Every day | ORAL | Status: DC

## 2015-01-03 MED ORDER — CYCLOBENZAPRINE HCL 10 MG PO TABS: 10.0000 mg | ORAL_TABLET | Freq: Every day | ORAL | Status: DC

## 2015-01-03 MED ORDER — FENOFIBRATE 48 MG PO TABS: 48.0000 mg | ORAL_TABLET | Freq: Every day | ORAL | Status: DC

## 2015-01-03 MED ORDER — MELOXICAM 7.5 MG PO TABS: 7.5000 mg | ORAL_TABLET | Freq: Every day | ORAL | Status: DC

## 2015-01-03 NOTE — Progress Notes (Signed)
Establish Care with new PCP F/U HTN Taking medication as prescribed  Refills Rx Cyclobenzaprine  No hx  Tobacco

## 2015-01-03 NOTE — Assessment & Plan Note (Signed)
A; bp elevated Med: compliant P: Increase lisinopril to 40 mg daily

## 2015-01-03 NOTE — Patient Instructions (Signed)
Shannon [name],  Thank you for coming in today  Shannon [name] was seen today for establish care and hypertension.  Diagnoses and all orders for this visit:  Healthcare maintenance -     Flu Vaccine QUAD 36+ mos IM  Lumbosacral spondylosis without myelopathy  Essential hypertension -     lisinopril (PRINIVIL,ZESTRIL) 40 MG tablet; Take 1 tablet (40 mg total) by mouth daily. -     metoprolol succinate (TOPROL-XL) 100 MG 24 hr tablet; Take 1 tablet (100 mg total) by mouth daily. Take with or immediately following a meal.  Chronic low back pain -     cyclobenzaprine (FLEXERIL) 10 MG tablet; Take 1 tablet (10 mg total) by mouth at bedtime. -     meloxicam (MOBIC) 7.5 MG tablet; Take 1 tablet (7.5 mg total) by mouth daily.  Dyslipidemia -     fenofibrate (TRICOR) 48 MG tablet; Take 1 tablet (48 mg total) by mouth daily.

## 2015-01-03 NOTE — Progress Notes (Signed)
Patient ID: Shannon [name], female   DOB: 1955/06/01, 59 y.o.   MRN: [id number]   Subjective:  Patient ID: Shannon [name], female    DOB: May 09, 1955  Age: 59 y.o. MRN: [id number]  CC: Establish Care and Hypertension   HPI Shannon [name] presents for HTN  1. CHRONIC HYPERTENSION  Disease Monitoring  Blood pressure range: not checking   Chest pain: no   Dyspnea: no   Claudication: no   Medication compliance: yes  Medication Side Effects  Lightheadedness: no   Urinary frequency: no   Edema: no    Preventitive Healthcare:  Exercise: yes   Salt Restriction: no   Social History  Substance Use Topics  . Smoking status: Never Smoker   . Smokeless tobacco: Not on file  . Alcohol Use: No    Outpatient Prescriptions Prior to Visit  Medication Sig Dispense Refill  . cyclobenzaprine (FLEXERIL) 10 MG tablet Take 1 tablet (10 mg total) by mouth at bedtime. 30 tablet 0  . fenofibrate (TRICOR) 48 MG tablet TAKE 1 TABLET(48 MG) BY MOUTH DAILY 90 tablet 0  . lisinopril (PRINIVIL,ZESTRIL) 20 MG tablet Take 1 tablet (20 mg total) by mouth daily. 30 tablet 2  . meloxicam (MOBIC) 7.5 MG tablet TAKE 1 TABLET(7.5 MG) BY MOUTH DAILY 90 tablet 0  . metoprolol succinate (TOPROL-XL) 100 MG 24 hr tablet TAKE 1 TABLET BY MOUTH DAILY WITH OR IMMEDIATELY FOLLOWING A MEAL 30 tablet 0  . metoprolol succinate (TOPROL-XL) 100 MG 24 hr tablet TAKE 1 TABLET BY MOUTH DAILY WITH OR IMMEDIATELY FOLLOWING A MEAL 90 tablet 2  . oxyCODONE (OXY IR/ROXICODONE) 5 MG immediate release tablet Take 1 tablet (5 mg total) by mouth every 6 (six) hours as needed for severe pain. May take an extra tablet when pain is sever. No More than 4 a day (Patient not taking: Reported on 01/03/2015) 100 tablet 0   No facility-administered medications prior to visit.    ROS Review of Systems  Constitutional: Negative for fever and chills.  Eyes: Negative for visual disturbance.  Respiratory: Negative for shortness of  breath.   Cardiovascular: Negative for chest pain.  Gastrointestinal: Negative for abdominal pain and blood in stool.  Musculoskeletal: Negative for back pain and arthralgias.  Skin: Negative for rash.  Allergic/Immunologic: Negative for immunocompromised state.  Hematological: Negative for adenopathy. Does not bruise/bleed easily.  Psychiatric/Behavioral: Negative for suicidal ideas and dysphoric mood.    Objective:  BP 157/88 mmHg  Pulse 58  Temp(Src) 98.6 F (37 C) (Oral)  Resp 16  Ht 5\' 5"  (1.651 m)  Wt 187 lb (84.823 kg)  BMI 31.12 kg/m2  SpO2 96%  BP/Weight 01/03/2015 2/68/3419 09/13/2295  Systolic BP 989 211 941  Diastolic BP 88 80 740  Wt. (Lbs) 187 191 -  BMI 31.12 30.84 -   Physical Exam  Constitutional: She is oriented to person, place, and time. She appears well-developed and well-nourished. No distress.  HENT:  Head: Normocephalic and atraumatic.  Cardiovascular: Normal rate, regular rhythm, normal heart sounds and intact distal pulses.   Pulmonary/Chest: Effort normal and breath sounds normal.  Musculoskeletal: She exhibits no edema.  Neurological: She is alert and oriented to person, place, and time.  Skin: Skin is warm and dry. No rash noted.  Psychiatric: She has a normal mood and affect.     Assessment & Plan:   Problem List Items Addressed This Visit    Chronic low back pain (Chronic)   Relevant Medications  cyclobenzaprine (FLEXERIL) 10 MG tablet   meloxicam (MOBIC) 7.5 MG tablet   Other Relevant Orders   Ambulatory referral to Pain Clinic   Dyslipidemia (Chronic)   Relevant Medications   fenofibrate (TRICOR) 48 MG tablet   Essential hypertension - Primary (Chronic)    A; bp elevated Med: compliant P: Increase lisinopril to 40 mg daily       Relevant Medications   fenofibrate (TRICOR) 48 MG tablet   lisinopril (PRINIVIL,ZESTRIL) 40 MG tablet   metoprolol succinate (TOPROL-XL) 100 MG 24 hr tablet    Other Visit Diagnoses    Healthcare  maintenance        Relevant Orders    Flu Vaccine QUAD 36+ mos IM       No orders of the defined types were placed in this encounter.    Follow-up: No Follow-up on file.   Boykin Nearing MD

## 2015-01-22 ENCOUNTER — Telehealth: Payer: Self-pay | Admitting: Family Medicine

## 2015-01-22 NOTE — Telephone Encounter (Signed)
Sheritta from [facility] called and requested to speak with PCP. She stated that she has tried to fax over a form that needs completion several times but the fax has not came through, she would like to know if she could bring it by the office and have the form filled out the same day. Please f/u

## 2015-02-04 ENCOUNTER — Other Ambulatory Visit: Payer: Self-pay | Admitting: Internal Medicine

## 2015-02-05 NOTE — Telephone Encounter (Signed)
Pt. Called stating she faxed over paperwork for her PCP to fill out. PCP faxed it to [facility] home [facility] but the PCP put the wrong NPI and it was sent back for the PCP to correct it. Please f/u with pt. For more information.

## 2015-03-01 ENCOUNTER — Telehealth: Payer: Self-pay | Admitting: Family Medicine

## 2015-03-01 NOTE — Telephone Encounter (Signed)
meloxicam (MOBIC) 7.5 MG tablet fenofibrate (TRICOR) 48 MG tablet metoprolol succinate (TOPROL-XL) 100 MG 24 hr tablet - Patient mentioned she still might have refills but if not to please refill it.  [facility] on [address]

## 2015-03-04 NOTE — Telephone Encounter (Signed)
Pt has refills.

## 2015-03-12 ENCOUNTER — Telehealth: Payer: Self-pay | Admitting: Family Medicine

## 2015-03-12 NOTE — Telephone Encounter (Signed)
meloxicam (MOBIC) 7.5 MG tablet fenofibrate (TRICOR) 48 MG tablet   Patient does not have refills. Please f/u

## 2015-04-09 ENCOUNTER — Other Ambulatory Visit: Payer: Self-pay

## 2015-04-09 DIAGNOSIS — Z1231 Encounter for screening mammogram for malignant neoplasm of breast: Secondary | ICD-10-CM

## 2015-04-12 ENCOUNTER — Encounter: Payer: Self-pay | Admitting: Family Medicine

## 2015-05-02 ENCOUNTER — Encounter: Payer: Self-pay | Admitting: Family Medicine

## 2015-05-02 ENCOUNTER — Ambulatory Visit: Payer: Medicare Other | Attending: Family Medicine | Admitting: Family Medicine

## 2015-05-02 VITALS — BP 144/92 | HR 67 | Temp 98.0°F | Resp 16 | Ht 65.0 in | Wt 189.0 lb

## 2015-05-02 DIAGNOSIS — M1712 Unilateral primary osteoarthritis, left knee: Secondary | ICD-10-CM | POA: Diagnosis not present

## 2015-05-02 DIAGNOSIS — G8929 Other chronic pain: Secondary | ICD-10-CM | POA: Insufficient documentation

## 2015-05-02 DIAGNOSIS — Z79899 Other long term (current) drug therapy: Secondary | ICD-10-CM | POA: Diagnosis not present

## 2015-05-02 DIAGNOSIS — M545 Low back pain, unspecified: Secondary | ICD-10-CM

## 2015-05-02 DIAGNOSIS — M179 Osteoarthritis of knee, unspecified: Secondary | ICD-10-CM | POA: Diagnosis not present

## 2015-05-02 DIAGNOSIS — Z9889 Other specified postprocedural states: Secondary | ICD-10-CM | POA: Insufficient documentation

## 2015-05-02 DIAGNOSIS — M47817 Spondylosis without myelopathy or radiculopathy, lumbosacral region: Secondary | ICD-10-CM | POA: Diagnosis not present

## 2015-05-02 DIAGNOSIS — I10 Essential (primary) hypertension: Secondary | ICD-10-CM | POA: Insufficient documentation

## 2015-05-02 LAB — BASIC METABOLIC PANEL
BUN: 17 mg/dL (ref 7–25)
CALCIUM: 10.1 mg/dL (ref 8.6–10.4)
CO2: 25 mmol/L (ref 20–31)
Chloride: 107 mmol/L (ref 98–110)
Creat: 0.9 mg/dL (ref 0.50–1.05)
Glucose, Bld: 92 mg/dL (ref 65–99)
Potassium: 5.1 mmol/L (ref 3.5–5.3)
Sodium: 142 mmol/L (ref 135–146)

## 2015-05-02 MED ORDER — METOPROLOL SUCCINATE ER 100 MG PO TB24: 100.0000 mg | ORAL_TABLET | Freq: Every day | ORAL | Status: DC

## 2015-05-02 MED ORDER — LISINOPRIL 40 MG PO TABS: 40.0000 mg | ORAL_TABLET | Freq: Every day | ORAL | Status: DC

## 2015-05-02 NOTE — Progress Notes (Signed)
Patient ID: Shannon [name], female   DOB: 06/16/1955, 60 y.o.   MRN: [id number]   Subjective:  Patient ID: Shannon [name], female    DOB: [birth year]  Age: 60 y.o. MRN: [id number]  CC: Hypertension   HPI Diasia Sivertsen Gutknecht presents for   1. CHRONIC HYPERTENSION  Disease Monitoring  Blood pressure range: not checking   Chest pain: no   Dyspnea: no   Claudication: no   Medication compliance: yes  Medication Side Effects  Lightheadedness: no   Urinary frequency: no   Edema: no   Impotence: no   Preventitive Healthcare:  Exercise: no   Diet Pattern:   Salt Restriction: yes  2. Chronic low back pain: has hx of low back surgery. Reports pain with bending. 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 She takes tylenol for pain. She is requesting home health services from a [facility] [phone]) with [facility] [phone])  3. L knee pain: this is also chronic. No hx of surgery. Has intermittent swelling in L lateral knee. No locking or popping. Has stiffness.   Past Surgical History  Procedure Laterality Date  . Abdominal hysterectomy    . Back surgery  09/01/2010    L4-5 lumbar decompression including laminotomy, facetectomy,   . Fracture surgery      left leg  . Uterine fibroid surgery    . Colonscopy    . Ventral hernia repair N/A 05/24/2013    Procedure: OPEN REPAIR EPIGASTRIC VENTRAL HERNIA;  Surgeon: Imogene Burn. Georgette Dover, MD;  Location: [place];  Service: General;  Laterality: N/A;  . Insertion of mesh N/A 05/24/2013    Procedure: INSERTION OF MESH;  Surgeon: Imogene Burn. Georgette Dover, MD;  Location: [facility];  Service: General;  Laterality: N/A;     Social History  Substance Use Topics  . Smoking status: Never Smoker   . Smokeless tobacco: Not on file  . Alcohol Use: No    Outpatient Prescriptions Prior to Visit  Medication Sig Dispense Refill  . cyclobenzaprine (FLEXERIL) 10 MG tablet Take 1 tablet (10 mg total) by mouth at  bedtime. 90 tablet 1  . fenofibrate (TRICOR) 48 MG tablet Take 1 tablet (48 mg total) by mouth daily. 90 tablet 1  . lisinopril (PRINIVIL,ZESTRIL) 40 MG tablet Take 1 tablet (40 mg total) by mouth daily. 90 tablet 1  . meloxicam (MOBIC) 7.5 MG tablet Take 1 tablet (7.5 mg total) by mouth daily. 90 tablet 1  . metoprolol succinate (TOPROL-XL) 100 MG 24 hr tablet Take 1 tablet (100 mg total) by mouth daily. Take with or immediately following a meal. 90 tablet 2  . meloxicam (MOBIC) 7.5 MG tablet TAKE 1 TABLET BY MOUTH EVERY DAY 90 tablet 0   No facility-administered medications prior to visit.    ROS Review of Systems  Constitutional: Negative for fever and chills.  Eyes: Negative for visual disturbance.  Respiratory: Negative for shortness of breath.   Cardiovascular: Negative for chest pain.  Gastrointestinal: Negative for abdominal pain and blood in stool.  Musculoskeletal: Positive for back pain, joint swelling and arthralgias.  Skin: Negative for rash.  Allergic/Immunologic: Negative for immunocompromised state.  Hematological: Negative for adenopathy. Does not bruise/bleed easily.  Psychiatric/Behavioral: Negative for suicidal ideas and dysphoric mood.    Objective:  BP 144/92 mmHg  Pulse 67  Temp(Src) 98 F (36.7 C) (Oral)  Resp 16  Ht 5\' 5"  (1.651 m)  Wt 189 lb (85.73 kg)  BMI 31.45 kg/m2  SpO2 98%  BP/Weight 05/02/2015 01/03/2015 [id number]  Systolic BP [id number]  Diastolic BP 92 88 80  Wt. (Lbs) 189 187 191  BMI 31.45 31.12 30.84   Physical Exam  Constitutional: She is oriented to person, place, and time. She appears well-developed and well-nourished. No distress.  HENT:  Head: Normocephalic and atraumatic.  Cardiovascular: Normal rate, regular rhythm, normal heart sounds and intact distal pulses.   Pulmonary/Chest: Effort normal and breath sounds normal.  Musculoskeletal: She exhibits no edema.       Lumbar back: She exhibits decreased range of motion,  tenderness, pain and spasm. She exhibits no bony tenderness, no swelling, no edema and no deformity.       Back:  Neurological: She is alert and oriented to person, place, and time.  Skin: Skin is warm and dry. No rash noted.  Psychiatric: She has a normal mood and affect.     Assessment & Plan:   Problem List Items Addressed This Visit    Chronic low back pain (Chronic)   Relevant Orders   Ambulatory referral to [facility] hypertension - Primary (Chronic)   Relevant Medications   metoprolol succinate (TOPROL-XL) 100 MG 24 hr tablet   lisinopril (PRINIVIL,ZESTRIL) 40 MG tablet   Other Relevant Orders   Basic Metabolic Panel   Lumbosacral spondylosis without myelopathy (Chronic)   Relevant Orders   Ambulatory referral to [place] of left knee (Chronic)   Relevant Orders   Ambulatory referral to [place]   Ambulatory referral to Sports Medicine      No orders of the defined types were placed in this encounter.    Follow-up: No Follow-up on file.   Boykin Nearing MD

## 2015-05-02 NOTE — Progress Notes (Signed)
F/U HTN  No pain today  No tobacco user  No suicidal thought

## 2015-05-02 NOTE — Assessment & Plan Note (Signed)
A: BP slightly above goal Med: compliant P: Continue current regimen  Blood pressure  is near goal. Please work on lowering the BP naturally by doing the following  1. Increase exercise 2. Lower salt  3. Work on weight loss of 10 lbs

## 2015-05-02 NOTE — Patient Instructions (Addendum)
[place] was seen today for hypertension.  Diagnoses and all orders for this visit:  Essential hypertension -     metoprolol succinate (TOPROL-XL) 100 MG 24 hr tablet; Take 1 tablet (100 mg total) by mouth daily. Take with or immediately following a meal. -     lisinopril (PRINIVIL,ZESTRIL) 40 MG tablet; Take 1 tablet (40 mg total) by mouth daily. -     Basic Metabolic Panel  Lumbosacral spondylosis without myelopathy -     Ambulatory referral to Home Health  Chronic low back pain -     Ambulatory referral to [facility]  Primary osteoarthritis of left knee -     Ambulatory referral to [facility] -     Ambulatory referral to Sports Medicine   Blood pressure  is near goal. Please work on lowering the BP naturally by doing the following  1. Increase exercise 2. Lower salt  3. Work on weight loss of 10 lbs   You will be called about sports medicine and home health referrals F/u in 4 weeks with pharmacist for BP check  F/u with me in 3 months for HTN   Dr. Adrian Blackwater

## 2015-05-10 ENCOUNTER — Telehealth: Payer: Self-pay | Admitting: Family Medicine

## 2015-05-10 DIAGNOSIS — M47817 Spondylosis without myelopathy or radiculopathy, lumbosacral region: Secondary | ICD-10-CM

## 2015-05-10 DIAGNOSIS — M545 Low back pain: Secondary | ICD-10-CM

## 2015-05-10 DIAGNOSIS — G8929 Other chronic pain: Secondary | ICD-10-CM

## 2015-05-10 MED ORDER — TRAMADOL HCL 50 MG PO TABS: 50.0000 mg | ORAL_TABLET | Freq: Three times a day (TID) | ORAL | Status: DC | PRN

## 2015-05-10 NOTE — Telephone Encounter (Signed)
-----   Message from Boykin Nearing, MD sent at 05/03/2015  8:28 AM EST ----- Normal BMP  Normal kidney function

## 2015-05-10 NOTE — Telephone Encounter (Signed)
Called patient  Verified name and DOB Gave lab results  She asked for tramadol for low back pain. Informed patient that tramadol will be ready for pick today.  She voiced understanding.

## 2015-05-20 ENCOUNTER — Encounter: Payer: Self-pay | Admitting: Clinical

## 2015-05-20 ENCOUNTER — Ambulatory Visit (INDEPENDENT_AMBULATORY_CARE_PROVIDER_SITE_OTHER): Payer: Medicare Other | Admitting: Sports Medicine

## 2015-05-20 ENCOUNTER — Encounter: Payer: Self-pay | Admitting: Sports Medicine

## 2015-05-20 ENCOUNTER — Other Ambulatory Visit: Payer: Self-pay | Admitting: Sports Medicine

## 2015-05-20 VITALS — BP 154/96 | Ht 62.0 in | Wt 180.0 lb

## 2015-05-20 DIAGNOSIS — G8929 Other chronic pain: Secondary | ICD-10-CM

## 2015-05-20 DIAGNOSIS — M1712 Unilateral primary osteoarthritis, left knee: Secondary | ICD-10-CM | POA: Diagnosis not present

## 2015-05-20 DIAGNOSIS — M545 Low back pain: Secondary | ICD-10-CM

## 2015-05-20 MED ORDER — MELOXICAM 15 MG PO TABS: ORAL_TABLET | ORAL | Status: DC

## 2015-05-20 NOTE — Progress Notes (Signed)
  Shannon [name] - 60 y.o. female MRN [id number]  Date of birth: Oct 23, 1955  SUBJECTIVE:  Including CC & ROS.  No chief complaint on file. CC: left knee pain  HPI: Patient presents today with 1 year of left knee pain, which is gradually worsening. Pain described as "aching" and is worst over the lateral aspect of the L knee. Pain worst at night time after patient has been on her feet all day. Knee feels stiff in the mornings as well, but does not hurt as bad as it does at night. Pain does not radiate. Has tried heat and tylenol with minimal improvement. Knee often feels unstable and patient has fallen at home after her knee "gives out." Knee with occasional swelling. Patient does not work, but is active at home and on her feet a lot cleaning and cooking. Denies catching or locking of the knee. No issues with right knee.   HISTORY: Past Medical, Surgical, Social, and Family History Reviewed & Updated per EMR.   Pertinent Historical Findings include: History of tibial fracture in the past (date unknown) with pins in place History of chronic back pain   PHYSICAL EXAM:  VS: BP:(!) 154/96 mmHg  HR: bpm  TEMP: ( )  RESP:   HT:5\' 2"  (157.5 cm)   WT:180 lb (81.647 kg)  BMI:33 PHYSICAL EXAM: L knee:  Prominent tibial tuberosity. No erythema or effusion noted.  Tender to palpation along the medial joint line. 3+ patellofemoral crepitus with flexion Full ROM with extension and flexion. No valgus or varus laxity. 5/5 strength Neurovascularly intact Valgus thrust with standing  ASSESSMENT & PLAN: See problem based charting & AVS for pt instructions.  1.) Left knee osteoarthritis - Patient's symptoms and exam consistent with osteoarthritis. Has not had x-rays of knee in the past. Is able to take meloxicam without issue for chronic back pain. Also has prescription for tramadol for back pain. Discussed injection with patient and she wishes to pursue medical management at this time. - L knee x-rays  (AP standing, 30 degree flexion, lateral, sunrise views) - Increased Meloxicam to 15 mg qd for 1 week, and then PRN  - Continue Tramadol - Compression sleeve for additional knee stabilization while working around the house - Will F/U in three weeks - if no improvement at that time, consider knee injection and PT exercises.

## 2015-05-20 NOTE — Progress Notes (Signed)
[facility] has scheduled home health assessment on 05/22/2015, to assess for home health aide/care.

## 2015-05-21 ENCOUNTER — Ambulatory Visit
Admission: RE | Admit: 2015-05-21 | Discharge: 2015-05-21 | Disposition: A | Discharge status: Home / Self Care | Payer: Medicare Other | Source: Ambulatory Visit

## 2015-05-21 DIAGNOSIS — Z1231 Encounter for screening mammogram for malignant neoplasm of breast: Secondary | ICD-10-CM | POA: Diagnosis not present

## 2015-05-30 ENCOUNTER — Encounter: Payer: Medicare Other | Admitting: Pharmacist

## 2015-05-31 ENCOUNTER — Ambulatory Visit
Admission: RE | Admit: 2015-05-31 | Discharge: 2015-05-31 | Disposition: A | Discharge status: Home / Self Care | Payer: Medicare Other | Source: Ambulatory Visit | Attending: Sports Medicine | Admitting: Sports Medicine

## 2015-05-31 DIAGNOSIS — M1712 Unilateral primary osteoarthritis, left knee: Secondary | ICD-10-CM

## 2015-06-04 ENCOUNTER — Encounter: Payer: Medicare Other | Admitting: Pharmacist

## 2015-06-18 ENCOUNTER — Other Ambulatory Visit: Payer: Self-pay | Admitting: Family Medicine

## 2015-06-19 ENCOUNTER — Ambulatory Visit (INDEPENDENT_AMBULATORY_CARE_PROVIDER_SITE_OTHER): Payer: Medicare Other | Admitting: Sports Medicine

## 2015-06-19 ENCOUNTER — Encounter: Payer: Self-pay | Admitting: Sports Medicine

## 2015-06-19 VITALS — BP 137/97 | HR 107 | Ht 62.0 in | Wt 185.0 lb

## 2015-06-19 DIAGNOSIS — M1732 Unilateral post-traumatic osteoarthritis, left knee: Secondary | ICD-10-CM | POA: Diagnosis not present

## 2015-06-19 MED ORDER — METHYLPREDNISOLONE ACETATE 40 MG/ML IJ SUSP
40.0000 mg | Freq: Once | INTRAMUSCULAR | Status: AC
  Administered 2015-06-19: 40 mg via INTRA_ARTICULAR

## 2015-06-19 NOTE — Progress Notes (Signed)
   Subjective:    Patient ID: Shannon [name], female    DOB: 18-Aug-1955, 60 y.o.   MRN: [id number]  HPI   Patient comes in today for follow-up on her left knee pain. Recent x-rays of her knee shows post traumatic osteoarthritis. It is mild to moderate tricompartmental disease. Patient's symptoms have improved with meloxicam and a compression sleeve but it has not completely resolved. Tramadol has also been somewhat helpful. She would like to consider a cortisone injection today.    Review of Systems     Objective:   Physical Exam  Well-developed, well-nourished. No acute distress  Left knee: Full range of motion. There is no obvious effusion. She still slightly tender to palpation along the medial joint line but a negative McMurray's. 3+ patellofemoral crepitus. Good joint stability. Neurovascularly intact distally. Valgus thrust with standing.  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 An anterior lateral approach was utilized. Patient tolerated this without difficulty. She will continue with her meloxicam and tramadol as needed. Continue with her compression sleeve as well. Follow-up for ongoing or recalcitrant issues.  Consent obtained and verified. Time-out conducted. Noted no overlying erythema, induration, or other signs of local infection. Skin prepped in a sterile fashion. Topical analgesic spray: Ethyl chloride. Joint: left knee Needle: 22g 1.5 inch Completed without difficulty. Meds: 3cc 1% xylocaine, 1cc (40mg ) depomedrol  Advised to call if fevers/chills, erythema, induration, drainage, or persistent bleeding.

## 2015-07-11 ENCOUNTER — Other Ambulatory Visit: Payer: Self-pay | Admitting: Sports Medicine

## 2015-08-09 ENCOUNTER — Encounter: Payer: Self-pay | Admitting: Family Medicine

## 2015-08-09 ENCOUNTER — Ambulatory Visit: Payer: Medicare Other | Attending: Family Medicine | Admitting: Family Medicine

## 2015-08-09 ENCOUNTER — Encounter: Payer: Self-pay | Admitting: Clinical

## 2015-08-09 VITALS — BP 141/94 | HR 70 | Temp 98.5°F | Resp 16 | Ht 62.0 in | Wt 189.0 lb

## 2015-08-09 DIAGNOSIS — M545 Low back pain, unspecified: Secondary | ICD-10-CM

## 2015-08-09 DIAGNOSIS — Z9889 Other specified postprocedural states: Secondary | ICD-10-CM | POA: Insufficient documentation

## 2015-08-09 DIAGNOSIS — Z79899 Other long term (current) drug therapy: Secondary | ICD-10-CM | POA: Diagnosis not present

## 2015-08-09 DIAGNOSIS — M1712 Unilateral primary osteoarthritis, left knee: Secondary | ICD-10-CM | POA: Insufficient documentation

## 2015-08-09 DIAGNOSIS — Z90710 Acquired absence of both cervix and uterus: Secondary | ICD-10-CM | POA: Insufficient documentation

## 2015-08-09 DIAGNOSIS — I10 Essential (primary) hypertension: Secondary | ICD-10-CM

## 2015-08-09 DIAGNOSIS — G8929 Other chronic pain: Secondary | ICD-10-CM | POA: Insufficient documentation

## 2015-08-09 MED ORDER — CYCLOBENZAPRINE HCL 10 MG PO TABS: 10.0000 mg | ORAL_TABLET | Freq: Every day | ORAL | Status: DC

## 2015-08-09 MED ORDER — MELOXICAM 15 MG PO TABS: 15.0000 mg | ORAL_TABLET | Freq: Every day | ORAL | Status: DC

## 2015-08-09 MED ORDER — ACETAMINOPHEN-CODEINE #3 300-30 MG PO TABS: 1.0000 | ORAL_TABLET | Freq: Two times a day (BID) | ORAL | Status: DC

## 2015-08-09 NOTE — Progress Notes (Signed)
Patient ID: Shannon [name], female   DOB: 1956-02-26, 60 y.o.   MRN: [id number]   Subjective:  Patient ID: Shannon [name], female    DOB: 10-Sep-1955  Age: 60 y.o. MRN: [id number]  CC: No chief complaint on file.   HPI Shannon [name] presents for   1. CHRONIC HYPERTENSION  Disease Monitoring  Blood pressure range: not checking   Chest pain: no   Dyspnea: no   Claudication: no   Medication compliance: yes  Medication Side Effects  Lightheadedness: no   Urinary frequency: no   Edema: no   Impotence: no   Preventitive Healthcare:  Exercise: no   Diet Pattern:   Salt Restriction: yes  2. Chronic low back pain: has hx of low back surgery. Reports pain with bending. 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 She takes tylenol, mobic and flexeril at night  for pain. She is did not qualify for home health services. She does not exercise. She reports that she would like a hover-round. Pain is 8/10.   Social History  Substance Use Topics  . Smoking status: Never Smoker   . Smokeless tobacco: Not on file  . Alcohol Use: No    Past Surgical History  Procedure Laterality Date  . Abdominal hysterectomy    . Back surgery  09/01/2010    L4-5 lumbar decompression including laminotomy, facetectomy,   . Fracture surgery      left leg  . Uterine fibroid surgery    . Colonscopy    . Ventral hernia repair N/A 05/24/2013    Procedure: OPEN REPAIR EPIGASTRIC VENTRAL HERNIA;  Surgeon: Imogene Burn. Georgette Dover, MD;  Location: [place];  Service: General;  Laterality: N/A;  . Insertion of mesh N/A 05/24/2013    Procedure: INSERTION OF MESH;  Surgeon: Imogene Burn. Georgette Dover, MD;  Location: [place];  Service: General;  Laterality: N/A;   Outpatient Prescriptions Prior to Visit  Medication Sig Dispense Refill  . cyclobenzaprine (FLEXERIL) 10 MG tablet Take 1 tablet (10 mg total) by mouth at bedtime. 90 tablet 1  . fenofibrate (TRICOR) 48 MG tablet Take 1 tablet (48 mg total) by  mouth daily. 90 tablet 1  . lisinopril (PRINIVIL,ZESTRIL) 40 MG tablet Take 1 tablet (40 mg total) by mouth daily. 90 tablet 2  . meloxicam (MOBIC) 15 MG tablet TAKE 1 TABLET BY MOUTH DAILY FOR 7 DAYS THEN TAKE AS NEEDED. TAKE WITH FOOD 40 tablet 0  . meloxicam (MOBIC) 7.5 MG tablet TAKE 1 TABLET BY MOUTH EVERY DAY 90 tablet 0  . metoprolol succinate (TOPROL-XL) 100 MG 24 hr tablet Take 1 tablet (100 mg total) by mouth daily. Take with or immediately following a meal. 90 tablet 2  . traMADol (ULTRAM) 50 MG tablet Take 1 tablet (50 mg total) by mouth every 8 (eight) hours as needed. 60 tablet 0   No facility-administered medications prior to visit.    ROS Review of Systems  Constitutional: Negative for fever and chills.  Eyes: Negative for visual disturbance.  Respiratory: Negative for shortness of breath.   Cardiovascular: Negative for chest pain.  Gastrointestinal: Negative for abdominal pain and blood in stool.  Musculoskeletal: Positive for back pain, joint swelling and arthralgias.  Skin: Negative for rash.  Allergic/Immunologic: Negative for immunocompromised state.  Hematological: Negative for adenopathy. Does not bruise/bleed easily.  Psychiatric/Behavioral: Negative for suicidal ideas and dysphoric mood.    Objective:  BP 141/94 mmHg  Pulse 70  Temp(Src) 98.5 F (36.9 C) (Oral)  Resp 16  Ht 5\' 2"  (1.575 m)  Wt 189 lb (85.73 kg)  BMI 34.56 kg/m2  SpO2 95%  BP/Weight 08/09/2015 Q000111Q [id number]  Systolic BP Q000111Q 0000000 [id number]  Diastolic BP 94 97 96  Wt. (Lbs) 189 185 180  BMI 34.56 33.83 32.91   Physical Exam  Constitutional: She is oriented to person, place, and time. She appears well-developed and well-nourished. No distress.  HENT:  Head: Normocephalic and atraumatic.  Cardiovascular: Normal rate, regular rhythm, normal heart sounds and intact distal pulses.   Pulmonary/Chest: Effort normal and breath sounds normal.  Musculoskeletal: She exhibits no edema.    Neurological: She is alert and oriented to person, place, and time.  Skin: Skin is warm and dry. No rash noted.  Psychiatric: She has a normal mood and affect.     Assessment & Plan:   Problem List Items Addressed This Visit    Osteoarthritis of left knee - Primary (Chronic)   Relevant Medications   meloxicam (MOBIC) 15 MG tablet   acetaminophen-codeine (TYLENOL #3) 300-30 MG tablet   cyclobenzaprine (FLEXERIL) 10 MG tablet   Chronic low back pain (Chronic)   Relevant Medications   meloxicam (MOBIC) 15 MG tablet   acetaminophen-codeine (TYLENOL #3) 300-30 MG tablet   cyclobenzaprine (FLEXERIL) 10 MG tablet      No orders of the defined types were placed in this encounter.    Follow-up: No Follow-up on file.   Boykin Nearing MD

## 2015-08-09 NOTE — Patient Instructions (Addendum)
S[place] was seen today for hypertension.  Diagnoses and all orders for this visit:  Primary osteoarthritis of left knee -     meloxicam (MOBIC) 15 MG tablet; Take 1 tablet (15 mg total) by mouth daily.  Chronic low back pain -     acetaminophen-codeine (TYLENOL #3) 300-30 MG tablet; Take 1 tablet by mouth 2 (two) times daily. -     cyclobenzaprine (FLEXERIL) 10 MG tablet; Take 1 tablet (10 mg total) by mouth at bedtime.  Essential hypertension   F/u in 3 months for HTN   Dr. Adrian Blackwater

## 2015-08-09 NOTE — Progress Notes (Signed)
F/U HTN  Taking medication as prescription as prescribed  No pain today today  No suicidal thoughts in the past two weeks  No tobacco user

## 2015-08-09 NOTE — Progress Notes (Signed)
Depression screen [facility] 2/9 08/09/2015 06/19/2015 05/20/2015 05/02/2015 01/03/2015  Decreased Interest 1 0 0 0 0  Down, Depressed, Hopeless 0 0 0 0 0  PHQ - 2 Score 1 0 0 0 0  Altered sleeping 0 - 0 2 -  Tired, decreased energy 1 - 0 1 -  Change in appetite 0 - 0 1 -  Feeling bad or failure about yourself  0 - 0 0 -  Trouble concentrating 2 - 0 0 -  Moving slowly or fidgety/restless 0 - 0 0 -  Suicidal thoughts 0 - 0 0 -  PHQ-9 Score 4 - 0 4 -    GAD 7 : Generalized Anxiety Score 08/09/2015 05/02/2015  Nervous, Anxious, on Edge 0 0  Control/stop worrying 2 0  Worry too much - different things 0 1  Trouble relaxing 0 2  Restless 1 0  Easily annoyed or irritable 0 0  Afraid - awful might happen 0 0  Total GAD 7 Score 3 3

## 2015-08-09 NOTE — Assessment & Plan Note (Signed)
Improved near goal  Advised weight loss to get B to goal.

## 2015-08-09 NOTE — Assessment & Plan Note (Signed)
Chronic pain Advise weight loss Exercise as tolerated Discouraged hover round Tylenol #3 for pain control Back brace for support Rx written

## 2015-09-25 ENCOUNTER — Other Ambulatory Visit: Payer: Self-pay | Admitting: Family Medicine

## 2015-10-02 ENCOUNTER — Telehealth: Payer: Self-pay | Admitting: Family Medicine

## 2015-10-02 DIAGNOSIS — I10 Essential (primary) hypertension: Secondary | ICD-10-CM

## 2015-10-02 MED ORDER — LISINOPRIL 40 MG PO TABS: 40.0000 mg | ORAL_TABLET | Freq: Every day | ORAL | Status: DC

## 2015-10-02 MED ORDER — METOPROLOL SUCCINATE ER 100 MG PO TB24
100.0000 mg | ORAL_TABLET | Freq: Every day | ORAL | Status: DC
Start: 2015-10-02 — End: 2015-11-08

## 2015-10-02 NOTE — Telephone Encounter (Signed)
Lisinopril and metoprolol succinate have been refilled.

## 2015-10-02 NOTE — Telephone Encounter (Signed)
Pt. Called requesting a refill on the following medications:  metoprolol succinate (TOPROL-XL) 100 MG 24 hr tablet  lisinopril (PRINIVIL,ZESTRIL) 40 MG tablet  Please f/u

## 2015-11-08 ENCOUNTER — Ambulatory Visit: Payer: Medicare Other | Attending: Family Medicine | Admitting: Family Medicine

## 2015-11-08 ENCOUNTER — Encounter: Payer: Self-pay | Admitting: Family Medicine

## 2015-11-08 VITALS — BP 167/108 | HR 92 | Temp 98.5°F | Resp 17 | Ht 62.0 in | Wt 184.2 lb

## 2015-11-08 DIAGNOSIS — I10 Essential (primary) hypertension: Secondary | ICD-10-CM

## 2015-11-08 DIAGNOSIS — M545 Low back pain, unspecified: Secondary | ICD-10-CM

## 2015-11-08 DIAGNOSIS — G8929 Other chronic pain: Secondary | ICD-10-CM

## 2015-11-08 MED ORDER — METOPROLOL SUCCINATE ER 100 MG PO TB24: 100.0000 mg | ORAL_TABLET | Freq: Every day | ORAL | 3 refills | Status: DC

## 2015-11-08 MED ORDER — ACETAMINOPHEN-CODEINE #3 300-30 MG PO TABS: 1.0000 | ORAL_TABLET | Freq: Two times a day (BID) | ORAL | 2 refills | Status: DC

## 2015-11-08 MED ORDER — LISINOPRIL 40 MG PO TABS: 40.0000 mg | ORAL_TABLET | Freq: Every day | ORAL | 3 refills | Status: DC

## 2015-11-08 NOTE — Patient Instructions (Addendum)
Shannon [name] was seen today for hypertension.  Diagnoses and all orders for this visit:  Essential hypertension -     lisinopril (PRINIVIL,ZESTRIL) 40 MG tablet; Take 1 tablet (40 mg total) by mouth daily. -     metoprolol succinate (TOPROL-XL) 100 MG 24 hr tablet; Take 1 tablet (100 mg total) by mouth daily. Take with or immediately following a meal.  Chronic low back pain -     acetaminophen-codeine (TYLENOL #3) 300-30 MG tablet; Take 1 tablet by mouth 2 (two) times daily.   [facility] [phone]  Dr. Adrian Blackwater

## 2015-11-08 NOTE — Progress Notes (Signed)
Patient ID: Shannon [name], female   DOB: 1956/02/06, 60 y.o.   MRN: [id number]   Subjective:  Patient ID: Shannon [name], female    DOB: 1955-10-18  Age: 60 y.o. MRN: [id number]  CC: Hypertension   HPI Allyson Rima Kue presents for   1. CHRONIC HYPERTENSION  Disease Monitoring  Blood pressure range: not checking   Chest pain: no   Dyspnea: no   Claudication: no   Medication compliance: yes  Medication Side Effects  Lightheadedness: no   Urinary frequency: no   Edema: no   Impotence: no   Preventitive Healthcare:  Exercise: no   Diet Pattern:   Salt Restriction: yes  2. Chronic low back pain: has hx of low back surgery. Reports pain with bending. 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 She takes tylenol #3, mobic and flexeril at night  for pain. She is walking for exercise.   Social History  Substance Use Topics  . Smoking status: Never Smoker  . Smokeless tobacco: Not on file  . Alcohol use No    Past Surgical History:  Procedure Laterality Date  . ABDOMINAL HYSTERECTOMY    . BACK SURGERY  09/01/2010   L4-5 lumbar decompression including laminotomy, facetectomy,   . colonscopy    . FRACTURE SURGERY     left leg  . INSERTION OF MESH N/A 05/24/2013   Procedure: INSERTION OF MESH;  Surgeon: Imogene Burn. Georgette Dover, MD;  Location: [facility];  Service: General;  Laterality: N/A;  . UTERINE FIBROID SURGERY    . VENTRAL HERNIA REPAIR N/A 05/24/2013   Procedure: OPEN REPAIR EPIGASTRIC VENTRAL HERNIA;  Surgeon: Imogene Burn. Georgette Dover, MD;  Location: [facility];  Service: General;  Laterality: N/A;   Outpatient Medications Prior to Visit  Medication Sig Dispense Refill  . acetaminophen-codeine (TYLENOL #3) 300-30 MG tablet Take 1 tablet by mouth 2 (two) times daily. 60 tablet 0  . cyclobenzaprine (FLEXERIL) 10 MG tablet Take 1 tablet (10 mg total) by mouth at bedtime. 90 tablet 3  . fenofibrate (TRICOR) 48 MG tablet TAKE 1 TABLET BY MOUTH DAILY 90 tablet 0    . lisinopril (PRINIVIL,ZESTRIL) 40 MG tablet Take 1 tablet (40 mg total) by mouth daily. 90 tablet 0  . meloxicam (MOBIC) 15 MG tablet Take 1 tablet (15 mg total) by mouth daily. 30 tablet 5  . metoprolol succinate (TOPROL-XL) 100 MG 24 hr tablet Take 1 tablet (100 mg total) by mouth daily. Take with or immediately following a meal. 90 tablet 0   No facility-administered medications prior to visit.     ROS Review of Systems  Constitutional: Negative for chills and fever.  Eyes: Negative for visual disturbance.  Respiratory: Negative for shortness of breath.   Cardiovascular: Negative for chest pain.  Gastrointestinal: Negative for abdominal pain and blood in stool.  Musculoskeletal: Positive for arthralgias, back pain and joint swelling.  Skin: Negative for rash.  Allergic/Immunologic: Negative for immunocompromised state.  Hematological: Negative for adenopathy. Does not bruise/bleed easily.  Psychiatric/Behavioral: Negative for dysphoric mood and suicidal ideas.    Objective:  BP (!) 167/108 (BP Location: Left Arm, Patient Position: Sitting, Cuff Size: Large)   Pulse 92   Temp 98.5 F (36.9 C) (Oral)   Resp 17   Ht 5\' 2"  (1.575 m)   Wt 184 lb 3.2 oz (83.6 kg)   SpO2 95%   BMI 33.69 kg/m   BP/Weight 11/08/2015 Q000111Q Q000111Q  Systolic BP A999333 Q000111Q 0000000  Diastolic BP  108 94 97  Wt. (Lbs) 184.2 189 185  BMI 33.69 34.56 33.83   Physical Exam  Constitutional: She is oriented to person, place, and time. She appears well-developed and well-nourished. No distress.  HENT:  Head: Normocephalic and atraumatic.  Cardiovascular: Normal rate, regular rhythm, normal heart sounds and intact distal pulses.   Pulmonary/Chest: Effort normal and breath sounds normal.  Musculoskeletal: She exhibits no edema.  Neurological: She is alert and oriented to person, place, and time.  Skin: Skin is warm and dry. No rash noted.  Psychiatric: She has a normal mood and affect.     Assessment  & Plan:   Problem List Items Addressed This Visit      High   Essential hypertension - Primary (Chronic)    BP elevated today but previously well controlled Will continue current regimen Low salt diet Exercise Increase toprol at fu visit if needed       Relevant Medications   lisinopril (PRINIVIL,ZESTRIL) 40 MG tablet   metoprolol succinate (TOPROL-XL) 100 MG 24 hr tablet   Chronic low back pain (Chronic)    Chronic low back pain Controlled with current regimen Refilled tylenol #3       Relevant Medications   acetaminophen-codeine (TYLENOL #3) 300-30 MG tablet    Other Visit Diagnoses   None.     No orders of the defined types were placed in this encounter.   Follow-up: No Follow-up on file.   Boykin Nearing MD

## 2015-11-08 NOTE — Progress Notes (Signed)
Patient is here for follow up of HTN. Patient has not taken BP medication since yesterday.

## 2015-11-10 NOTE — Assessment & Plan Note (Signed)
BP elevated today but previously well controlled Will continue current regimen Low salt diet Exercise Increase toprol at fu visit if needed

## 2015-11-10 NOTE — Assessment & Plan Note (Signed)
Chronic low back pain Controlled with current regimen Refilled tylenol #3

## 2016-02-11 ENCOUNTER — Ambulatory Visit: Payer: Medicare Other | Attending: Family Medicine | Admitting: Family Medicine

## 2016-02-11 ENCOUNTER — Encounter: Payer: Self-pay | Admitting: Family Medicine

## 2016-02-11 VITALS — BP 160/106 | HR 61 | Temp 98.1°F | Ht 62.0 in | Wt 176.4 lb

## 2016-02-11 DIAGNOSIS — G8929 Other chronic pain: Secondary | ICD-10-CM | POA: Diagnosis not present

## 2016-02-11 DIAGNOSIS — M1712 Unilateral primary osteoarthritis, left knee: Secondary | ICD-10-CM | POA: Diagnosis not present

## 2016-02-11 DIAGNOSIS — I10 Essential (primary) hypertension: Secondary | ICD-10-CM | POA: Insufficient documentation

## 2016-02-11 DIAGNOSIS — Z9889 Other specified postprocedural states: Secondary | ICD-10-CM | POA: Diagnosis not present

## 2016-02-11 DIAGNOSIS — Z79899 Other long term (current) drug therapy: Secondary | ICD-10-CM | POA: Insufficient documentation

## 2016-02-11 DIAGNOSIS — E785 Hyperlipidemia, unspecified: Secondary | ICD-10-CM | POA: Insufficient documentation

## 2016-02-11 DIAGNOSIS — M545 Low back pain: Secondary | ICD-10-CM | POA: Diagnosis not present

## 2016-02-11 DIAGNOSIS — Z23 Encounter for immunization: Secondary | ICD-10-CM | POA: Diagnosis not present

## 2016-02-11 MED ORDER — HYDRALAZINE HCL 25 MG PO TABS: 25.0000 mg | ORAL_TABLET | Freq: Three times a day (TID) | ORAL | 3 refills | Status: DC

## 2016-02-11 MED ORDER — ACETAMINOPHEN-CODEINE #3 300-30 MG PO TABS: 1.0000 | ORAL_TABLET | Freq: Two times a day (BID) | ORAL | 2 refills | Status: DC

## 2016-02-11 MED ORDER — MELOXICAM 15 MG PO TABS: 15.0000 mg | ORAL_TABLET | Freq: Every day | ORAL | 5 refills | Status: DC

## 2016-02-11 MED ORDER — METOPROLOL SUCCINATE ER 100 MG PO TB24: 100.0000 mg | ORAL_TABLET | Freq: Every day | ORAL | 3 refills | Status: DC

## 2016-02-11 MED ORDER — FENOFIBRATE 48 MG PO TABS: 48.0000 mg | ORAL_TABLET | Freq: Every day | ORAL | 3 refills | Status: DC

## 2016-02-11 MED ORDER — LISINOPRIL 40 MG PO TABS: 40.0000 mg | ORAL_TABLET | Freq: Every day | ORAL | 3 refills | Status: DC

## 2016-02-11 MED ORDER — CLONIDINE HCL 0.1 MG PO TABS
0.1000 mg | ORAL_TABLET | Freq: Once | ORAL | Status: AC
  Administered 2016-02-11: 0.2 mg via ORAL

## 2016-02-11 NOTE — Progress Notes (Signed)
Patient ID: [name], female   DOB: December 19, 1955, 60 y.o.   MRN: [id number]   Subjective:  Patient ID: Shannon [name], female    DOB: 1955-06-19  Age: 60 y.o. MRN: [id number]  CC: Hypertension   HPI Shannon [name] presents for   1. CHRONIC HYPERTENSION  Disease Monitoring  Blood pressure range: not checking   Chest pain: no   Dyspnea: no   Claudication: no   Medication compliance: yes  Medication Side Effects  Lightheadedness: no   Urinary frequency: no   Edema: no   Impotence: no   Preventitive Healthcare:  Exercise: no   Diet Pattern:   Salt Restriction: yes  2. Chronic low back pain: has hx of low back surgery. Reports pain with bending. 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 She takes tylenol #3, mobic and flexeril at night  for pain. She is walking for exercise.   Social History  Substance Use Topics  . Smoking status: Never Smoker  . Smokeless tobacco: Not on file  . Alcohol use No    Past Surgical History:  Procedure Laterality Date  . ABDOMINAL HYSTERECTOMY    . BACK SURGERY  09/01/2010   L4-5 lumbar decompression including laminotomy, facetectomy,   . colonscopy    . FRACTURE SURGERY     left leg  . INSERTION OF MESH N/A 05/24/2013   Procedure: INSERTION OF MESH;  Surgeon: Imogene Burn. Georgette Dover, MD;  Location: [place];  Service: General;  Laterality: N/A;  . UTERINE FIBROID SURGERY    . VENTRAL HERNIA REPAIR N/A 05/24/2013   Procedure: OPEN REPAIR EPIGASTRIC VENTRAL HERNIA;  Surgeon: Imogene Burn. Georgette Dover, MD;  Location: [facility];  Service: General;  Laterality: N/A;   Outpatient Medications Prior to Visit  Medication Sig Dispense Refill  . acetaminophen-codeine (TYLENOL #3) 300-30 MG tablet Take 1 tablet by mouth 2 (two) times daily. 60 tablet 2  . cyclobenzaprine (FLEXERIL) 10 MG tablet Take 1 tablet (10 mg total) by mouth at bedtime. 90 tablet 3  . fenofibrate (TRICOR) 48 MG tablet TAKE 1 TABLET BY MOUTH DAILY 90 tablet 0    . lisinopril (PRINIVIL,ZESTRIL) 40 MG tablet Take 1 tablet (40 mg total) by mouth daily. 90 tablet 3  . meloxicam (MOBIC) 15 MG tablet Take 1 tablet (15 mg total) by mouth daily. 30 tablet 5  . metoprolol succinate (TOPROL-XL) 100 MG 24 hr tablet Take 1 tablet (100 mg total) by mouth daily. Take with or immediately following a meal. 90 tablet 3   No facility-administered medications prior to visit.     ROS Review of Systems  Constitutional: Negative for chills and fever.  Eyes: Negative for visual disturbance.  Respiratory: Negative for shortness of breath.   Cardiovascular: Negative for chest pain.  Gastrointestinal: Negative for abdominal pain and blood in stool.  Musculoskeletal: Positive for arthralgias, back pain and joint swelling.  Skin: Negative for rash.  Allergic/Immunologic: Negative for immunocompromised state.  Hematological: Negative for adenopathy. Does not bruise/bleed easily.  Psychiatric/Behavioral: Negative for dysphoric mood and suicidal ideas.    Objective:  BP (!) 184/124 (BP Location: Left Arm, Patient Position: Sitting, Cuff Size: Small)   Pulse 61   Temp 98.1 F (36.7 C) (Oral)   Ht 5\' 2"  (1.575 m)   Wt 176 lb 6.4 oz (80 kg)   SpO2 100%   BMI 32.26 kg/m   BP/Weight 02/11/2016 11/08/2015 Q000111Q  Systolic BP Q000111Q A999333 Q000111Q  Diastolic BP A999333 [id number] 94  Wt. (Lbs) 176.4 184.2 189  BMI 32.26 33.69 34.56   Physical Exam  Constitutional: She is oriented to person, place, and time. She appears well-developed and well-nourished. No distress.  HENT:  Head: Normocephalic and atraumatic.  Cardiovascular: Normal rate, regular rhythm, normal heart sounds and intact distal pulses.   Pulmonary/Chest: Effort normal and breath sounds normal.  Musculoskeletal: She exhibits no edema.  Neurological: She is alert and oriented to person, place, and time.  Skin: Skin is warm and dry. No rash noted.  Psychiatric: She has a normal mood and affect.   Treated with clonidine  0.1 mg PO x one  Repeat BP 160/106   Assessment & Plan:   Problem List Items Addressed This Visit      High   Essential hypertension - Primary (Chronic)    A: chronic HTN, uncontrolled Med: compliant P:         Relevant Medications   cloNIDine (CATAPRES) tablet 0.1 mg (Completed)   hydrALAZINE (APRESOLINE) 25 MG tablet   metoprolol succinate (TOPROL-XL) 100 MG 24 hr tablet   fenofibrate (TRICOR) 48 MG tablet   lisinopril (PRINIVIL,ZESTRIL) 40 MG tablet   Other Relevant Orders   BASIC METABOLIC PANEL WITH GFR   Chronic low back pain (Chronic)   Relevant Medications   acetaminophen-codeine (TYLENOL #3) 300-30 MG tablet   meloxicam (MOBIC) 15 MG tablet     Medium   Osteoarthritis of left knee (Chronic)   Relevant Medications   acetaminophen-codeine (TYLENOL #3) 300-30 MG tablet   meloxicam (MOBIC) 15 MG tablet   Dyslipidemia (Chronic)   Relevant Medications   fenofibrate (TRICOR) 48 MG tablet   Other Relevant Orders   Lipid Panel    Other Visit Diagnoses    Encounter for immunization       Relevant Medications   cloNIDine (CATAPRES) tablet 0.1 mg (Completed)   acetaminophen-codeine (TYLENOL #3) 300-30 MG tablet   hydrALAZINE (APRESOLINE) 25 MG tablet   metoprolol succinate (TOPROL-XL) 100 MG 24 hr tablet   fenofibrate (TRICOR) 48 MG tablet   lisinopril (PRINIVIL,ZESTRIL) 40 MG tablet   meloxicam (MOBIC) 15 MG tablet   Other Relevant Orders   Flu Vaccine QUAD 36+ mos IM (Completed)   BASIC METABOLIC PANEL WITH GFR      Meds ordered this encounter  Medications  . cloNIDine (CATAPRES) tablet 0.1 mg    Follow-up: No Follow-up on file.   Boykin Nearing MD

## 2016-02-11 NOTE — Progress Notes (Signed)
Pt is here to follow up on HTN.   Pt needs refills on all medications.  Pt is getting flu shot today.

## 2016-02-11 NOTE — Patient Instructions (Signed)
Shannon [name] was seen today for hypertension.  Diagnoses and all orders for this visit:  Essential hypertension -     cloNIDine (CATAPRES) tablet 0.1 mg; Take 1 tablet (0.1 mg total) by mouth once. -     BASIC METABOLIC PANEL WITH GFR -     hydrALAZINE (APRESOLINE) 25 MG tablet; Take 1 tablet (25 mg total) by mouth 3 (three) times daily. -     metoprolol succinate (TOPROL-XL) 100 MG 24 hr tablet; Take 1 tablet (100 mg total) by mouth daily. Take with or immediately following a meal. -     lisinopril (PRINIVIL,ZESTRIL) 40 MG tablet; Take 1 tablet (40 mg total) by mouth daily.  Encounter for immunization -     cloNIDine (CATAPRES) tablet 0.1 mg; Take 1 tablet (0.1 mg total) by mouth once. -     Flu Vaccine QUAD 36+ mos IM -     BASIC METABOLIC PANEL WITH GFR -     acetaminophen-codeine (TYLENOL #3) 300-30 MG tablet; Take 1 tablet by mouth 2 (two) times daily.  Chronic bilateral low back pain, with sciatica presence unspecified -     acetaminophen-codeine (TYLENOL #3) 300-30 MG tablet; Take 1 tablet by mouth 2 (two) times daily.  Primary osteoarthritis of left knee -     meloxicam (MOBIC) 15 MG tablet; Take 1 tablet (15 mg total) by mouth daily.  Dyslipidemia -     fenofibrate (TRICOR) 48 MG tablet; Take 1 tablet (48 mg total) by mouth daily.    Return in 2 weeks for BP recheck   Dr. Adrian Blackwater

## 2016-02-13 NOTE — Assessment & Plan Note (Addendum)
A: chronic HTN, uncontrolled Med: compliant P: Continue current regimen Add hydralazine 25 mg TID Close f/u in 2 weeks BMP with GFR and lipids at f/u- ran out of time today, patient's transportation waiting

## 2016-05-04 ENCOUNTER — Encounter: Payer: Self-pay | Admitting: Family Medicine

## 2016-05-04 ENCOUNTER — Ambulatory Visit: Payer: Medicare Other | Attending: Family Medicine | Admitting: Family Medicine

## 2016-05-04 VITALS — BP 150/92 | HR 85 | Temp 99.4°F | Ht 62.0 in | Wt 177.2 lb

## 2016-05-04 DIAGNOSIS — I10 Essential (primary) hypertension: Secondary | ICD-10-CM

## 2016-05-04 DIAGNOSIS — J0110 Acute frontal sinusitis, unspecified: Secondary | ICD-10-CM | POA: Diagnosis not present

## 2016-05-04 DIAGNOSIS — Z79899 Other long term (current) drug therapy: Secondary | ICD-10-CM | POA: Diagnosis not present

## 2016-05-04 DIAGNOSIS — M545 Low back pain: Secondary | ICD-10-CM | POA: Insufficient documentation

## 2016-05-04 DIAGNOSIS — Z9889 Other specified postprocedural states: Secondary | ICD-10-CM | POA: Insufficient documentation

## 2016-05-04 DIAGNOSIS — R05 Cough: Secondary | ICD-10-CM | POA: Insufficient documentation

## 2016-05-04 DIAGNOSIS — R51 Headache: Secondary | ICD-10-CM | POA: Diagnosis not present

## 2016-05-04 DIAGNOSIS — G8929 Other chronic pain: Secondary | ICD-10-CM

## 2016-05-04 DIAGNOSIS — J321 Chronic frontal sinusitis: Secondary | ICD-10-CM | POA: Diagnosis not present

## 2016-05-04 MED ORDER — HYDRALAZINE HCL 50 MG PO TABS: 50.0000 mg | ORAL_TABLET | Freq: Three times a day (TID) | ORAL | 5 refills | Status: DC

## 2016-05-04 MED ORDER — AMOXICILLIN 875 MG PO TABS: 875.0000 mg | ORAL_TABLET | Freq: Two times a day (BID) | ORAL | 0 refills | Status: DC

## 2016-05-04 MED ORDER — ACETAMINOPHEN-CODEINE #3 300-30 MG PO TABS: 1.0000 | ORAL_TABLET | Freq: Two times a day (BID) | ORAL | 2 refills | Status: DC

## 2016-05-04 NOTE — Patient Instructions (Addendum)
Shannon [name] was seen today for hypertension.  Diagnoses and all orders for this visit:  Essential hypertension -     hydrALAZINE (APRESOLINE) 50 MG tablet; Take 1 tablet (50 mg total) by mouth 3 (three) times daily.  Acute non-recurrent frontal sinusitis -     amoxicillin (AMOXIL) 875 MG tablet; Take 1 tablet (875 mg total) by mouth 2 (two) times daily.  Chronic bilateral low back pain without sciatica -     acetaminophen-codeine (TYLENOL #3) 300-30 MG tablet; Take 1 tablet by mouth 2 (two) times daily.   corcidin HBP is a good cough medicine for HTN  F/u in 4 weeks for RN BP check  F/u in 3 months with me   Dr. Adrian Blackwater

## 2016-05-04 NOTE — Assessment & Plan Note (Signed)
Acute sinus infection Course of amoxicillin corcidin HBP for cough

## 2016-05-04 NOTE — Progress Notes (Signed)
Patient ID: Shannon [name], female   DOB: February 07, 1956, 61 y.o.   MRN: [id number]   Subjective:  Patient ID: Shannon [name], female    DOB: April 16, 1955  Age: 61 y.o. MRN: [id number]  CC: Hypertension   HPI Shannon [name] presents for   1. CHRONIC HYPERTENSION  Disease Monitoring  Blood pressure range: not checking   Chest pain: no   Dyspnea: no   Claudication: no   Medication compliance: yes  Medication Side Effects  Lightheadedness: no   Urinary frequency: no   Edema: no   Impotence: no   Preventitive Healthcare:  Exercise: no   Diet Pattern:   Salt Restriction: yes  2. Chronic low back pain: has hx of low back surgery. Reports pain with bending. 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 She takes tylenol #3, mobic and flexeril at night  for pain. She is walking for exercise.   3. Cough: x 1 week. With body aches and chills. No fever at home. Cough is productive. Also with headache and congestion. Symptoms improve temporarily with cold and and flu OTC medicine, but overall have not improved.   Social History  Substance Use Topics  . Smoking status: Never Smoker  . Smokeless tobacco: Never Used  . Alcohol use No    Past Surgical History:  Procedure Laterality Date  . ABDOMINAL HYSTERECTOMY    . BACK SURGERY  09/01/2010   L4-5 lumbar decompression including laminotomy, facetectomy,   . colonscopy    . FRACTURE SURGERY     left leg  . INSERTION OF MESH N/A 05/24/2013   Procedure: INSERTION OF MESH;  Surgeon: Imogene Burn. Georgette Dover, MD;  Location: [facility];  Service: General;  Laterality: N/A;  . UTERINE FIBROID SURGERY    . VENTRAL HERNIA REPAIR N/A 05/24/2013   Procedure: OPEN REPAIR EPIGASTRIC VENTRAL HERNIA;  Surgeon: Imogene Burn. Georgette Dover, MD;  Location: [facility];  Service: General;  Laterality: N/A;   Outpatient Medications Prior to Visit  Medication Sig Dispense Refill  . acetaminophen-codeine (TYLENOL #3) 300-30 MG tablet Take 1 tablet by  mouth 2 (two) times daily. 60 tablet 2  . cyclobenzaprine (FLEXERIL) 10 MG tablet Take 1 tablet (10 mg total) by mouth at bedtime. 90 tablet 3  . fenofibrate (TRICOR) 48 MG tablet Take 1 tablet (48 mg total) by mouth daily. 90 tablet 3  . hydrALAZINE (APRESOLINE) 25 MG tablet Take 1 tablet (25 mg total) by mouth 3 (three) times daily. 90 tablet 3  . lisinopril (PRINIVIL,ZESTRIL) 40 MG tablet Take 1 tablet (40 mg total) by mouth daily. 90 tablet 3  . meloxicam (MOBIC) 15 MG tablet Take 1 tablet (15 mg total) by mouth daily. 30 tablet 5  . metoprolol succinate (TOPROL-XL) 100 MG 24 hr tablet Take 1 tablet (100 mg total) by mouth daily. Take with or immediately following a meal. 90 tablet 3   No facility-administered medications prior to visit.     ROS Review of Systems  Constitutional: Negative for chills and fever.  HENT: Positive for congestion.   Eyes: Negative for visual disturbance.  Respiratory: Positive for cough. Negative for shortness of breath.   Cardiovascular: Negative for chest pain.  Gastrointestinal: Negative for abdominal pain and blood in stool.  Musculoskeletal: Positive for arthralgias, back pain and joint swelling.  Skin: Negative for rash.  Allergic/Immunologic: Negative for immunocompromised state.  Neurological: Positive for headaches.  Hematological: Negative for adenopathy. Does not bruise/bleed easily.  Psychiatric/Behavioral: Negative for dysphoric mood and  suicidal ideas.    Objective:  BP (!) 150/92 (BP Location: Left Arm, Patient Position: Sitting, Cuff Size: Small)   Pulse 85   Temp 99.4 F (37.4 C) (Oral)   Ht 5\' 2"  (1.575 m)   Wt 177 lb 3.2 oz (80.4 kg)   SpO2 93%   BMI 32.41 kg/m   BP/Weight 05/04/2016 02/11/2016 [id number]  Systolic BP [id number] 0000000 [id number]  Diastolic BP 92 [id number]  Wt. (Lbs) 177.2 176.4 184.2  BMI 32.41 32.26 33.69   Physical Exam  Constitutional: She is oriented to person, place, and time. She appears well-developed and  well-nourished. No distress.  HENT:  Head: Normocephalic and atraumatic.  Nose: Mucosal edema present. Right sinus exhibits frontal sinus tenderness. Left sinus exhibits frontal sinus tenderness.  Mouth/Throat: Oropharynx is clear and moist.  Cardiovascular: Normal rate, regular rhythm, normal heart sounds and intact distal pulses.   Pulmonary/Chest: Effort normal and breath sounds normal.  Musculoskeletal: She exhibits no edema.  Neurological: She is alert and oriented to person, place, and time.  Skin: Skin is warm and dry. No rash noted.  Psychiatric: She has a normal mood and affect.    Assessment & Plan:   Problem List Items Addressed This Visit      High   Essential hypertension - Primary (Chronic)   Relevant Medications   hydrALAZINE (APRESOLINE) 50 MG tablet   Chronic low back pain (Chronic)   Relevant Medications   acetaminophen-codeine (TYLENOL #3) 300-30 MG tablet    Other Visit Diagnoses    Acute non-recurrent frontal sinusitis       Relevant Medications   amoxicillin (AMOXIL) 875 MG tablet      No orders of the defined types were placed in this encounter.   Follow-up: Return in about 4 weeks (around 06/01/2016) for HTN, RN BP check .   Boykin Nearing MD

## 2016-05-11 ENCOUNTER — Other Ambulatory Visit: Payer: Self-pay | Admitting: Family Medicine

## 2016-05-11 DIAGNOSIS — Z1231 Encounter for screening mammogram for malignant neoplasm of breast: Secondary | ICD-10-CM

## 2016-05-22 ENCOUNTER — Ambulatory Visit
Admission: RE | Admit: 2016-05-22 | Discharge: 2016-05-22 | Disposition: A | Discharge status: Home / Self Care | Payer: Medicare Other | Source: Ambulatory Visit | Attending: Family Medicine | Admitting: Family Medicine

## 2016-05-22 DIAGNOSIS — Z1231 Encounter for screening mammogram for malignant neoplasm of breast: Secondary | ICD-10-CM | POA: Diagnosis not present

## 2016-05-27 ENCOUNTER — Telehealth: Payer: Self-pay

## 2016-05-27 NOTE — Telephone Encounter (Signed)
Pt was called and informed of results. 

## 2016-06-01 ENCOUNTER — Encounter: Payer: Medicare Other | Admitting: Family Medicine

## 2016-08-26 ENCOUNTER — Encounter: Payer: Self-pay | Admitting: Family Medicine

## 2016-09-03 ENCOUNTER — Inpatient Hospital Stay (HOSPITAL_COMMUNITY): Payer: Medicare Other

## 2016-09-03 ENCOUNTER — Emergency Department (HOSPITAL_COMMUNITY): Payer: Medicare Other

## 2016-09-03 ENCOUNTER — Inpatient Hospital Stay (HOSPITAL_COMMUNITY)
Admission: EM | Admit: 2016-09-03 | Discharge: 2016-09-09 | DRG: 482 | Disposition: A | Discharge status: Home Health | Payer: Medicare Other | Attending: Orthopedic Surgery | Admitting: Orthopedic Surgery

## 2016-09-03 ENCOUNTER — Encounter (HOSPITAL_COMMUNITY): Payer: Self-pay

## 2016-09-03 DIAGNOSIS — M79606 Pain in leg, unspecified: Secondary | ICD-10-CM

## 2016-09-03 DIAGNOSIS — Z823 Family history of stroke: Secondary | ICD-10-CM

## 2016-09-03 DIAGNOSIS — M79605 Pain in left leg: Secondary | ICD-10-CM | POA: Diagnosis not present

## 2016-09-03 DIAGNOSIS — Z803 Family history of malignant neoplasm of breast: Secondary | ICD-10-CM

## 2016-09-03 DIAGNOSIS — S72412A Displaced unspecified condyle fracture of lower end of left femur, initial encounter for closed fracture: Secondary | ICD-10-CM | POA: Diagnosis not present

## 2016-09-03 DIAGNOSIS — W010XXA Fall on same level from slipping, tripping and stumbling without subsequent striking against object, initial encounter: Secondary | ICD-10-CM | POA: Diagnosis not present

## 2016-09-03 DIAGNOSIS — Y92009 Unspecified place in unspecified non-institutional (private) residence as the place of occurrence of the external cause: Secondary | ICD-10-CM

## 2016-09-03 DIAGNOSIS — I10 Essential (primary) hypertension: Secondary | ICD-10-CM | POA: Diagnosis present

## 2016-09-03 DIAGNOSIS — S72392A Other fracture of shaft of left femur, initial encounter for closed fracture: Secondary | ICD-10-CM | POA: Diagnosis not present

## 2016-09-03 DIAGNOSIS — R52 Pain, unspecified: Secondary | ICD-10-CM | POA: Diagnosis not present

## 2016-09-03 DIAGNOSIS — W19XXXA Unspecified fall, initial encounter: Secondary | ICD-10-CM | POA: Diagnosis not present

## 2016-09-03 DIAGNOSIS — E785 Hyperlipidemia, unspecified: Secondary | ICD-10-CM | POA: Diagnosis present

## 2016-09-03 DIAGNOSIS — J9811 Atelectasis: Secondary | ICD-10-CM | POA: Diagnosis not present

## 2016-09-03 DIAGNOSIS — S7292XA Unspecified fracture of left femur, initial encounter for closed fracture: Secondary | ICD-10-CM | POA: Diagnosis not present

## 2016-09-03 DIAGNOSIS — Z90710 Acquired absence of both cervix and uterus: Secondary | ICD-10-CM

## 2016-09-03 DIAGNOSIS — F329 Major depressive disorder, single episode, unspecified: Secondary | ICD-10-CM | POA: Diagnosis not present

## 2016-09-03 DIAGNOSIS — S72402A Unspecified fracture of lower end of left femur, initial encounter for closed fracture: Principal | ICD-10-CM | POA: Diagnosis present

## 2016-09-03 DIAGNOSIS — T148XXA Other injury of unspecified body region, initial encounter: Secondary | ICD-10-CM

## 2016-09-03 HISTORY — DX: Headache: R51

## 2016-09-03 HISTORY — DX: Headache, unspecified: R51.9

## 2016-09-03 LAB — BASIC METABOLIC PANEL
Anion gap: 5 (ref 5–15)
BUN: 18 mg/dL (ref 6–20)
CHLORIDE: 115 mmol/L — AB (ref 101–111)
CO2: 23 mmol/L (ref 22–32)
CREATININE: 1.12 mg/dL — AB (ref 0.44–1.00)
Calcium: 8.9 mg/dL (ref 8.9–10.3)
GFR calc non Af Amer: 53 mL/min — ABNORMAL LOW (ref >60)
Glucose, Bld: 109 mg/dL — ABNORMAL HIGH (ref 65–99)
POTASSIUM: 4.1 mmol/L (ref 3.5–5.1)
Sodium: 143 mmol/L (ref 135–145)

## 2016-09-03 LAB — CBC WITH DIFFERENTIAL/PLATELET
Basophils Absolute: 0 10*3/uL (ref 0.0–0.1)
Basophils Relative: 0 %
Eosinophils Absolute: 0.1 10*3/uL (ref 0.0–0.7)
Eosinophils Relative: 1 %
HEMATOCRIT: 39.7 % (ref 36.0–46.0)
HEMOGLOBIN: 12.4 g/dL (ref 12.0–15.0)
LYMPHS ABS: 1.9 10*3/uL (ref 0.7–4.0)
LYMPHS PCT: 29 %
MCH: 23.3 pg — AB (ref 26.0–34.0)
MCHC: 31.2 g/dL (ref 30.0–36.0)
MCV: 74.6 fL — AB (ref 78.0–100.0)
MONOS PCT: 3 %
Monocytes Absolute: 0.2 10*3/uL (ref 0.1–1.0)
NEUTROS PCT: 67 %
Neutro Abs: 4.4 10*3/uL (ref 1.7–7.7)
Platelets: 284 10*3/uL (ref 150–400)
RBC: 5.32 MIL/uL — AB (ref 3.87–5.11)
RDW: 15.5 % (ref 11.5–15.5)
WBC: 6.5 10*3/uL (ref 4.0–10.5)

## 2016-09-03 LAB — TYPE AND SCREEN
ABO/RH(D): B POS
Antibody Screen: NEGATIVE

## 2016-09-03 MED ORDER — ACETAMINOPHEN 325 MG PO TABS
650.0000 mg | ORAL_TABLET | Freq: Four times a day (QID) | ORAL | Status: DC | PRN
  Administered 2016-09-03: 650 mg via ORAL
  Filled 2016-09-03: qty 2

## 2016-09-03 MED ORDER — METHOCARBAMOL 1000 MG/10ML IJ SOLN
500.0000 mg | Freq: Four times a day (QID) | INTRAVENOUS | Status: DC | PRN
  Filled 2016-09-03: qty 5

## 2016-09-03 MED ORDER — HYDRALAZINE HCL 50 MG PO TABS
50.0000 mg | ORAL_TABLET | Freq: Three times a day (TID) | ORAL | Status: DC
  Administered 2016-09-03 – 2016-09-09 (×16): 50 mg via ORAL
  Filled 2016-09-03 (×18): qty 1

## 2016-09-03 MED ORDER — OXYCODONE HCL 5 MG PO TABS
10.0000 mg | ORAL_TABLET | ORAL | Status: DC | PRN
  Administered 2016-09-03: 20 mg via ORAL
  Administered 2016-09-03: 15 mg via ORAL
  Administered 2016-09-04 (×4): 20 mg via ORAL
  Administered 2016-09-05: 15 mg via ORAL
  Administered 2016-09-05: 20 mg via ORAL
  Administered 2016-09-05 – 2016-09-06 (×2): 15 mg via ORAL
  Administered 2016-09-06: 20 mg via ORAL
  Administered 2016-09-06: 15 mg via ORAL
  Administered 2016-09-07 – 2016-09-09 (×13): 20 mg via ORAL
  Filled 2016-09-03 (×7): qty 4
  Filled 2016-09-03: qty 3
  Filled 2016-09-03 (×11): qty 4
  Filled 2016-09-03: qty 3
  Filled 2016-09-03 (×3): qty 4
  Filled 2016-09-03 (×2): qty 3

## 2016-09-03 MED ORDER — FENOFIBRATE 54 MG PO TABS
54.0000 mg | ORAL_TABLET | Freq: Every day | ORAL | Status: DC
  Administered 2016-09-04 – 2016-09-09 (×5): 54 mg via ORAL
  Filled 2016-09-03 (×7): qty 1

## 2016-09-03 MED ORDER — HYDROMORPHONE HCL 1 MG/ML IJ SOLN
0.5000 mg | Freq: Once | INTRAMUSCULAR | Status: AC
  Administered 2016-09-03: 0.5 mg via INTRAVENOUS
  Filled 2016-09-03: qty 1

## 2016-09-03 MED ORDER — ACETAMINOPHEN 650 MG RE SUPP: 650.0000 mg | Freq: Four times a day (QID) | RECTAL | Status: DC | PRN

## 2016-09-03 MED ORDER — LISINOPRIL 40 MG PO TABS
40.0000 mg | ORAL_TABLET | Freq: Every day | ORAL | Status: DC
  Administered 2016-09-04 – 2016-09-09 (×6): 40 mg via ORAL
  Filled 2016-09-03 (×6): qty 1

## 2016-09-03 MED ORDER — METHOCARBAMOL 500 MG PO TABS
500.0000 mg | ORAL_TABLET | Freq: Four times a day (QID) | ORAL | Status: DC | PRN
  Administered 2016-09-03 (×2): 500 mg via ORAL
  Filled 2016-09-03 (×2): qty 1

## 2016-09-03 MED ORDER — ONDANSETRON HCL 4 MG PO TABS: 4.0000 mg | ORAL_TABLET | Freq: Four times a day (QID) | ORAL | Status: DC | PRN

## 2016-09-03 MED ORDER — METOPROLOL SUCCINATE ER 100 MG PO TB24
100.0000 mg | ORAL_TABLET | Freq: Every day | ORAL | Status: DC
  Administered 2016-09-04 – 2016-09-09 (×6): 100 mg via ORAL
  Filled 2016-09-03 (×6): qty 1

## 2016-09-03 MED ORDER — DOCUSATE SODIUM 100 MG PO CAPS
100.0000 mg | ORAL_CAPSULE | Freq: Two times a day (BID) | ORAL | Status: DC
  Administered 2016-09-03 – 2016-09-09 (×12): 100 mg via ORAL
  Filled 2016-09-03 (×12): qty 1

## 2016-09-03 MED ORDER — HYDROMORPHONE HCL 1 MG/ML IJ SOLN
0.5000 mg | INTRAMUSCULAR | Status: DC | PRN
  Administered 2016-09-03 – 2016-09-07 (×16): 0.5 mg via INTRAVENOUS
  Filled 2016-09-03 (×18): qty 1

## 2016-09-03 MED ORDER — ENOXAPARIN SODIUM 40 MG/0.4ML ~~LOC~~ SOLN
40.0000 mg | SUBCUTANEOUS | Status: DC
  Administered 2016-09-03 – 2016-09-04 (×2): 40 mg via SUBCUTANEOUS
  Filled 2016-09-03 (×2): qty 0.4

## 2016-09-03 MED ORDER — ONDANSETRON HCL 4 MG/2ML IJ SOLN
4.0000 mg | Freq: Four times a day (QID) | INTRAMUSCULAR | Status: DC | PRN
Start: 2016-09-03 — End: 2016-09-05
  Administered 2016-09-03: 4 mg via INTRAVENOUS
  Filled 2016-09-03: qty 2

## 2016-09-03 MED ORDER — POTASSIUM CHLORIDE IN NACL 20-0.45 MEQ/L-% IV SOLN
INTRAVENOUS | Status: DC
  Administered 2016-09-03 – 2016-09-06 (×2): via INTRAVENOUS
  Filled 2016-09-03 (×5): qty 1000

## 2016-09-03 NOTE — Consult Note (Deleted)
Reason for Consult:Left distal femur fx Referring Physician: Estanislado Pandy Din is an 61 y.o. female.  HPI: Shannon [name] was walking into her house in sock feet when she slipped on her recently mopped floor and fell onto her left leg. She had immediate pain. She was brought to [facility] and x-rays revealed a comminuted distal femur fx. Orthopedic surgery was consulted.  Past Medical History:  Diagnosis Date  . Dizziness    occasionally  . Hyperlipidemia    takes Tricor daily  . Hypertension    takes Lisinopril and Metoprolol daily  . Numbness    stinging in right hand  . Peripheral edema    takes lasix daily  . Ventral hernia     Past Surgical History:  Procedure Laterality Date  . ABDOMINAL HYSTERECTOMY    . BACK SURGERY  09/01/2010   L4-5 lumbar decompression including laminotomy, facetectomy,   . colonscopy    . FRACTURE SURGERY     left leg  . INSERTION OF MESH N/A 05/24/2013   Procedure: INSERTION OF MESH;  Surgeon: Imogene Burn. Georgette Dover, MD;  Location: [facility];  Service: General;  Laterality: N/A;  . UTERINE FIBROID SURGERY    . VENTRAL HERNIA REPAIR N/A 05/24/2013   Procedure: OPEN REPAIR EPIGASTRIC VENTRAL HERNIA;  Surgeon: Imogene Burn. Georgette Dover, MD;  Location: [facility];  Service: General;  Laterality: N/A;    Family History  Problem Relation Age of Onset  . Stroke Mother   . Breast cancer Sister     Social History:  reports that she has never smoked. She has never used smokeless tobacco. She reports that she drinks about 1.2 oz of alcohol per week . She reports that she does not use drugs.  Allergies:  Allergies  Allergen Reactions  . Morphine And Related     Itch    Medications: I have reviewed the patient's current medications.  No results found for this or any previous visit (from the past 48 hour(s)).  Dg Chest Port 1 View  Result Date: 09/03/2016 CLINICAL DATA:  Preop EXAM: PORTABLE CHEST 1 VIEW COMPARISON:  05/22/2013 FINDINGS: Heart size upper normal. Mild  vascular congestion without edema or effusion. Mild bibasilar airspace disease most likely atelectasis. IMPRESSION: Mild vascular congestion without edema Mild bibasilar atelectasis. Electronically Signed   By: Franchot Gallo M.D.   On: 09/03/2016 14:09   Dg Femur Min 2 Views Left  Result Date: 09/03/2016 CLINICAL DATA:  Status post fall.  Left leg deformity. EXAM: LEFT FEMUR 2 VIEWS COMPARISON:  None. FINDINGS: Comminuted transverse fracture of the distal femoral diaphysis with a oblique fracture cleft extending from the primary fracture cleft into the left femoral diametaphysis. Apex posterior and lateral angulation. 10 mm of posterior displacement. 12 mm of lateral displacement. No other fracture or dislocation.  Large likely hemarthrosis. Partially visualize is old lateral tibial plateau fracture transfixed with 2 cannulated screws. IMPRESSION: Comminuted fracture of the distal left femoral diaphysis extending into the metaphysis with apex posterior and lateral angulation. Electronically Signed   By: Kathreen Devoid   On: 09/03/2016 13:30    Review of Systems  Constitutional: Negative for weight loss.  HENT: Negative for ear discharge, ear pain, hearing loss and tinnitus.   Eyes: Negative for blurred vision, double vision, photophobia and pain.  Respiratory: Negative for cough, sputum production and shortness of breath.   Cardiovascular: Negative for chest pain.  Gastrointestinal: Negative for abdominal pain, nausea and vomiting.  Genitourinary: Negative for dysuria, flank  pain, frequency and urgency.  Musculoskeletal: Positive for joint pain (Left knee). Negative for back pain, falls, myalgias and neck pain.  Neurological: Negative for dizziness, tingling, sensory change, focal weakness, loss of consciousness and headaches.  Endo/Heme/Allergies: Does not bruise/bleed easily.  Psychiatric/Behavioral: Negative for depression, memory loss and substance abuse. The patient is not nervous/anxious.     Blood pressure 125/87, pulse (!) 50, temperature 98 F (36.7 C), temperature source Oral, resp. rate 13, height 5\' 2"  (1.575 m), weight 78.5 kg (173 lb), SpO2 96 %. Physical Exam  Constitutional: She appears well-developed and well-nourished. No distress.  HENT:  Head: Normocephalic.  Eyes: Conjunctivae are normal. Right eye exhibits no discharge. Left eye exhibits no discharge. No scleral icterus.  Cardiovascular: Regular rhythm.  Bradycardia present.   Respiratory: Effort normal. No respiratory distress.  Musculoskeletal:  Bilateral shoulder, elbow, wrist, digits- no skin wounds, nontender, no instability, no blocks to motion  Sens  Ax/R/M/U intact  Mot   Ax/ R/ PIN/ M/ AIN/ U intact  Rad 2+  RLE No traumatic wounds, ecchymosis, or rash  Nontender  No effusions  Knee stable to varus/ valgus and anterior/posterior stress  Sens DPN, SPN, TN intact  Motor EHL, ext, flex, evers 5/5  DP 2+, PT 2+, No significant edema   LLE No traumatic wounds, ecchymosis, or rash  TTP knee  Significant effusion  Sens DPN, SPN paresthetic, TN intact  Motor EHL 3/5, ext, flex, evers 5/5  DP 2+, PT 2+, No significant edema  Neurological: She is alert.  Skin: Skin is warm and dry. She is not diaphoretic.  Psychiatric: She has a normal mood and affect. Her behavior is normal.    Assessment/Plan: Fall Distal left femur fx -- Will get CT to confirm no articular involvement. Plan for surgery tomorrow, surgeon unknown at this time. May have diet today. Will place in Buck's traction once she is in appropriate bed. Tobacco use -- Encouraged cessation EtOH use HTN -- Home meds    Lisette Abu, PA-C Orthopedic Surgery [phone] 09/03/2016, 2:35 PM

## 2016-09-03 NOTE — ED Notes (Signed)
ED Provider at bedside. 

## 2016-09-03 NOTE — ED Triage Notes (Signed)
Pt BIB [facility] from home. Pt reports slipping at home and her left leg went behind her. EMS reports obvious deformity to left femur with palpable pulses and feeling in left leg. Pt denies any other injuries with fall. 150 mcg Fentanyl given PTA.

## 2016-09-03 NOTE — ED Notes (Signed)
Patient transported to X-ray 

## 2016-09-03 NOTE — ED Provider Notes (Signed)
[facility] DEPT Provider Note   CSN: [id number] Arrival date & time: 09/03/16  1118     History   Chief Complaint Chief Complaint  Patient presents with  . Fall  . Leg Injury    left    HPI Shannon [name] is a 61 y.o. female.  Patient is a 60 year old female who presents with leg pain after a fall. She states that she slipped on the floor at home and her left leg got caught under her. She's had constant pain to her left mid femur since that time. She has reported femur deformity per EMS. She denies any other injuries. She denies hitting her head. She's not anticoagulants. She denies any neck or back pain. She was given 150 g of fentanyl prior to arrival by EMS. She has some improvement of symptoms after that.      Past Medical History:  Diagnosis Date  . Dizziness    occasionally  . Hyperlipidemia    takes Tricor daily  . Hypertension    takes Lisinopril and Metoprolol daily  . Numbness    stinging in right hand  . Peripheral edema    takes lasix daily  . Ventral hernia     Patient Active Problem List   Diagnosis Date Noted  . Closed fracture of left distal femur ([facility]) 09/03/2016  . Acute non-recurrent frontal sinusitis 05/04/2016  . Osteoarthritis of left knee 05/02/2015  . Postlaminectomy syndrome, lumbar region 11/09/2013  . Lumbosacral spondylosis without myelopathy 11/09/2013  . Dyslipidemia 10/11/2013  . Ventral hernia 05/19/2013  . DEPRESSION 06/13/2010  . ONYCHOMYCOSIS, TOENAILS 09/20/2009  . DIVERTICULOSIS, COLON 07/21/2009  . SYPHILIS 05/14/2009  . DYSHIDROTIC ECZEMA, HANDS 11/06/2008  . Chronic low back pain 11/06/2008  . FIBROIDS, UTERUS 11/17/2007  . Essential hypertension 11/17/2007    Past Surgical History:  Procedure Laterality Date  . ABDOMINAL HYSTERECTOMY    . BACK SURGERY  09/01/2010   L4-5 lumbar decompression including laminotomy, facetectomy,   . colonscopy    . FRACTURE SURGERY     left leg  . INSERTION OF MESH N/A  05/24/2013   Procedure: INSERTION OF MESH;  Surgeon: Imogene Burn. Georgette Dover, MD;  Location: [facility];  Service: General;  Laterality: N/A;  . UTERINE FIBROID SURGERY    . VENTRAL HERNIA REPAIR N/A 05/24/2013   Procedure: OPEN REPAIR EPIGASTRIC VENTRAL HERNIA;  Surgeon: Imogene Burn. Georgette Dover, MD;  Location: [facility];  Service: General;  Laterality: N/A;    OB History    No data available       Home Medications    Prior to Admission medications   Medication Sig Start Date End Date Taking? Authorizing Provider  acetaminophen-codeine (TYLENOL #3) 300-30 MG tablet Take 1 tablet by mouth 2 (two) times daily. 05/04/16  Yes Funches, Josalyn, MD  fenofibrate (TRICOR) 48 MG tablet Take 1 tablet (48 mg total) by mouth daily. 02/11/16  Yes Funches, Josalyn, MD  hydrALAZINE (APRESOLINE) 50 MG tablet Take 1 tablet (50 mg total) by mouth 3 (three) times daily. 05/04/16  Yes Funches, Josalyn, MD  lisinopril (PRINIVIL,ZESTRIL) 40 MG tablet Take 1 tablet (40 mg total) by mouth daily. 02/11/16  Yes Funches, Josalyn, MD  meloxicam (MOBIC) 15 MG tablet Take 1 tablet (15 mg total) by mouth daily. 02/11/16  Yes Funches, Josalyn, MD  metoprolol succinate (TOPROL-XL) 100 MG 24 hr tablet Take 1 tablet (100 mg total) by mouth daily. Take with or immediately following a meal. 02/11/16  Yes Boykin Nearing, MD  amoxicillin (  AMOXIL) 875 MG tablet Take 1 tablet (875 mg total) by mouth 2 (two) times daily. Patient not taking: Reported on 09/03/2016 05/04/16   Boykin Nearing, MD  cyclobenzaprine (FLEXERIL) 10 MG tablet Take 1 tablet (10 mg total) by mouth at bedtime. Patient not taking: Reported on 09/03/2016 08/09/15   Boykin Nearing, MD    Family History Family History  Problem Relation Age of Onset  . Stroke Mother   . Breast cancer Sister     Social History Social History  Substance Use Topics  . Smoking status: Never Smoker  . Smokeless tobacco: Never Used  . Alcohol use 1.2 oz/week    2 Cans of beer per week      Allergies   Morphine and related   Review of Systems Review of Systems  Constitutional: Negative for chills, diaphoresis, fatigue and fever.  HENT: Negative for congestion, rhinorrhea and sneezing.   Eyes: Negative.   Respiratory: Negative for cough, chest tightness and shortness of breath.   Cardiovascular: Negative for chest pain and leg swelling.  Gastrointestinal: Negative for abdominal pain, blood in stool, diarrhea, nausea and vomiting.  Genitourinary: Negative for difficulty urinating, flank pain, frequency and hematuria.  Musculoskeletal: Positive for arthralgias. Negative for back pain.  Skin: Negative for rash.  Neurological: Negative for dizziness, speech difficulty, weakness, numbness and headaches.     Physical Exam Updated Vital Signs BP 122/90   Pulse (!) 58   Temp 98 F (36.7 C) (Oral)   Resp (!) 24   Ht 5\' 2"  (1.575 m)   Wt 78.5 kg (173 lb)   SpO2 100%   BMI 31.64 kg/m   Physical Exam  Constitutional: She is oriented to person, place, and time. She appears well-developed and well-nourished.  HENT:  Head: Normocephalic and atraumatic.  Eyes: Pupils are equal, round, and reactive to light.  Neck: Normal range of motion. Neck supple.  No pain along the cervical thoracic or lumbosacral spine.  Cardiovascular: Normal rate, regular rhythm and normal heart sounds.   Pulmonary/Chest: Effort normal and breath sounds normal. No respiratory distress. She has no wheezes. She has no rales. She exhibits no tenderness.  Abdominal: Soft. Bowel sounds are normal. There is no tenderness. There is no rebound and no guarding.  Musculoskeletal: Normal range of motion. She exhibits no edema.  Positive deformity to the left mid femur. Pedal pulses are intact. She has normal sensation in the foot. There is no pain on palpation of the hip or the knee. No other pain on palpation or range of motion extremities  Lymphadenopathy:    She has no cervical adenopathy.   Neurological: She is alert and oriented to person, place, and time.  Skin: Skin is warm and dry. No rash noted.  Psychiatric: She has a normal mood and affect.     ED Treatments / Results  Labs (all labs ordered are listed, but only abnormal results are displayed) Labs Reviewed  BASIC METABOLIC PANEL - Abnormal; Notable for the following:       Result Value   Chloride 115 (*)    Glucose, Bld 109 (*)    Creatinine, Ser 1.12 (*)    GFR calc non Af Amer 53 (*)    All other components within normal limits  CBC WITH DIFFERENTIAL/PLATELET - Abnormal; Notable for the following:    RBC 5.32 (*)    MCV 74.6 (*)    MCH 23.3 (*)    All other components within normal limits  HIV ANTIBODY (ROUTINE  TESTING)  TYPE AND SCREEN    EKG  EKG Interpretation None       Radiology Dg Chest Port 1 View  Result Date: 09/03/2016 CLINICAL DATA:  Preop EXAM: PORTABLE CHEST 1 VIEW COMPARISON:  05/22/2013 FINDINGS: Heart size upper normal. Mild vascular congestion without edema or effusion. Mild bibasilar airspace disease most likely atelectasis. IMPRESSION: Mild vascular congestion without edema Mild bibasilar atelectasis. Electronically Signed   By: Franchot Gallo M.D.   On: 09/03/2016 14:09   Dg Femur Min 2 Views Left  Result Date: 09/03/2016 CLINICAL DATA:  Status post fall.  Left leg deformity. EXAM: LEFT FEMUR 2 VIEWS COMPARISON:  None. FINDINGS: Comminuted transverse fracture of the distal femoral diaphysis with a oblique fracture cleft extending from the primary fracture cleft into the left femoral diametaphysis. Apex posterior and lateral angulation. 10 mm of posterior displacement. 12 mm of lateral displacement. No other fracture or dislocation.  Large likely hemarthrosis. Partially visualize is old lateral tibial plateau fracture transfixed with 2 cannulated screws. IMPRESSION: Comminuted fracture of the distal left femoral diaphysis extending into the metaphysis with apex posterior and lateral  angulation. Electronically Signed   By: Kathreen Devoid   On: 09/03/2016 13:30    Procedures Procedures (including critical care time)  Medications Ordered in ED Medications  hydrALAZINE (APRESOLINE) tablet 50 mg (not administered)  fenofibrate tablet 54 mg (not administered)  lisinopril (PRINIVIL,ZESTRIL) tablet 40 mg (not administered)  metoprolol succinate (TOPROL-XL) 24 hr tablet 100 mg (not administered)  acetaminophen (TYLENOL) tablet 650 mg (not administered)    Or  acetaminophen (TYLENOL) suppository 650 mg (not administered)  enoxaparin (LOVENOX) injection 40 mg (not administered)  0.45 % NaCl with KCl 20 mEq / L infusion (not administered)  methocarbamol (ROBAXIN) tablet 500 mg (not administered)    Or  methocarbamol (ROBAXIN) 500 mg in dextrose 5 % 50 mL IVPB (not administered)  docusate sodium (COLACE) capsule 100 mg (not administered)  ondansetron (ZOFRAN) tablet 4 mg ( Oral See Alternative 09/03/16 1455)    Or  ondansetron (ZOFRAN) injection 4 mg (4 mg Intravenous Given 09/03/16 1455)  oxyCODONE (Oxy IR/ROXICODONE) immediate release tablet 10-20 mg (not administered)  HYDROmorphone (DILAUDID) injection 0.5 mg (0.5 mg Intravenous Given 09/03/16 1456)  HYDROmorphone (DILAUDID) injection 0.5 mg (0.5 mg Intravenous Given 09/03/16 1218)  HYDROmorphone (DILAUDID) injection 0.5 mg (0.5 mg Intravenous Given 09/03/16 1358)     Initial Impression / Assessment and Plan / ED Course  I have reviewed the triage vital signs and the nursing notes.  Pertinent labs & imaging results that were available during my care of the patient were reviewed by me and considered in my medical decision making (see chart for details).     Patient presents with leg pain after mechanical fall. She has evidence of a femur fracture. This is closed in nature. She was given pain management in the ED. Pulses are intact. I spoke with orthopedics who will admit the patient for further treatment.  Final Clinical  Impressions(s) / ED Diagnoses   Final diagnoses:  Leg pain    New Prescriptions Current Discharge Medication List       Malvin Johns, MD 09/03/16 1538

## 2016-09-03 NOTE — H&P (Signed)
Reason for Consult:Left distal femur fx Referring Physician: Estanislado Pandy Frimpong is an 61 y.o. female.  HPI: Sharma was walking into her house in sock feet when she slipped on her recently mopped floor and fell onto her left leg. She had immediate pain. She was brought to [facility] and x-rays revealed a comminuted distal femur fx. Orthopedic surgery was consulted.  Past Medical History:  Diagnosis Date  . Dizziness    occasionally  . Hyperlipidemia    takes Tricor daily  . Hypertension    takes Lisinopril and Metoprolol daily  . Numbness    stinging in right hand  . Peripheral edema    takes lasix daily  . Ventral hernia     Past Surgical History:  Procedure Laterality Date  . ABDOMINAL HYSTERECTOMY    . BACK SURGERY  09/01/2010   L4-5 lumbar decompression including laminotomy, facetectomy,   . colonscopy    . FRACTURE SURGERY     left leg  . INSERTION OF MESH N/A 05/24/2013   Procedure: INSERTION OF MESH;  Surgeon: Imogene Burn. Georgette Dover, MD;  Location: [place];  Service: General;  Laterality: N/A;  . UTERINE FIBROID SURGERY    . VENTRAL HERNIA REPAIR N/A 05/24/2013   Procedure: OPEN REPAIR EPIGASTRIC VENTRAL HERNIA;  Surgeon: Imogene Burn. Georgette Dover, MD;  Location: [place] [facility];  Service: General;  Laterality: N/A;    Family History  Problem Relation Age of Onset  . Stroke Mother   . Breast cancer Sister     Social History:  reports that she has never smoked. She has never used smokeless tobacco. She reports that she drinks about 1.2 oz of alcohol per week . She reports that she does not use drugs.  Allergies:  Allergies  Allergen Reactions  . Morphine And Related     Itch    Medications: I have reviewed the patient's current medications.  No results found for this or any previous visit (from the past 48 hour(s)).  Dg Chest Port 1 View  Result Date: 09/03/2016 CLINICAL DATA:  Preop EXAM: PORTABLE CHEST 1 VIEW COMPARISON:  05/22/2013 FINDINGS: Heart size upper normal. Mild  vascular congestion without edema or effusion. Mild bibasilar airspace disease most likely atelectasis. IMPRESSION: Mild vascular congestion without edema Mild bibasilar atelectasis. Electronically Signed   By: Franchot Gallo M.D.   On: 09/03/2016 14:09   Dg Femur Min 2 Views Left  Result Date: 09/03/2016 CLINICAL DATA:  Status post fall.  Left leg deformity. EXAM: LEFT FEMUR 2 VIEWS COMPARISON:  None. FINDINGS: Comminuted transverse fracture of the distal femoral diaphysis with a oblique fracture cleft extending from the primary fracture cleft into the left femoral diametaphysis. Apex posterior and lateral angulation. 10 mm of posterior displacement. 12 mm of lateral displacement. No other fracture or dislocation.  Large likely hemarthrosis. Partially visualize is old lateral tibial plateau fracture transfixed with 2 cannulated screws. IMPRESSION: Comminuted fracture of the distal left femoral diaphysis extending into the metaphysis with apex posterior and lateral angulation. Electronically Signed   By: Kathreen Devoid   On: 09/03/2016 13:30    Review of Systems  Constitutional: Negative for weight loss.  HENT: Negative for ear discharge, ear pain, hearing loss and tinnitus.   Eyes: Negative for blurred vision, double vision, photophobia and pain.  Respiratory: Negative for cough, sputum production and shortness of breath.   Cardiovascular: Negative for chest pain.  Gastrointestinal: Negative for abdominal pain, nausea and vomiting.  Genitourinary: Negative for dysuria, flank  pain, frequency and urgency.  Musculoskeletal: Positive for joint pain (Left knee). Negative for back pain, falls, myalgias and neck pain.  Neurological: Negative for dizziness, tingling, sensory change, focal weakness, loss of consciousness and headaches.  Endo/Heme/Allergies: Does not bruise/bleed easily.  Psychiatric/Behavioral: Negative for depression, memory loss and substance abuse. The patient is not nervous/anxious.     Blood pressure 125/87, pulse (!) 50, temperature 98 F (36.7 C), temperature source Oral, resp. rate 13, height 5\' 2"  (1.575 m), weight 78.5 kg (173 lb), SpO2 96 %. Physical Exam  Constitutional: She appears well-developed and well-nourished. No distress.  HENT:  Head: Normocephalic.  Eyes: Conjunctivae are normal. Right eye exhibits no discharge. Left eye exhibits no discharge. No scleral icterus.  Cardiovascular: Regular rhythm.  Bradycardia present.   Respiratory: Effort normal. No respiratory distress.  Musculoskeletal:  Bilateral shoulder, elbow, wrist, digits- no skin wounds, nontender, no instability, no blocks to motion  Sens  Ax/R/M/U intact  Mot   Ax/ R/ PIN/ M/ AIN/ U intact  Rad 2+  RLE No traumatic wounds, ecchymosis, or rash  Nontender  No effusions  Knee stable to varus/ valgus and anterior/posterior stress  Sens DPN, SPN, TN intact  Motor EHL, ext, flex, evers 5/5  DP 2+, PT 2+, No significant edema   LLE No traumatic wounds, ecchymosis, or rash  TTP knee  Significant effusion  Sens DPN, SPN paresthetic, TN intact  Motor EHL 3/5, ext, flex, evers 5/5  DP 2+, PT 2+, No significant edema  Neurological: She is alert.  Skin: Skin is warm and dry. She is not diaphoretic.  Psychiatric: She has a normal mood and affect. Her behavior is normal.    Assessment/Plan: Fall Distal left femur fx -- Will get CT to confirm no articular involvement. Plan for surgery tomorrow, surgeon unknown at this time. May have diet today. Will place in Buck's traction once she is in appropriate bed. Tobacco use -- Encouraged cessation EtOH use HTN -- Home meds    Lisette Abu, PA-C Orthopedic Surgery [phone] 09/03/2016, 2:35 PM

## 2016-09-04 LAB — HIV ANTIBODY (ROUTINE TESTING W REFLEX): HIV Screen 4th Generation wRfx: NONREACTIVE

## 2016-09-04 LAB — SURGICAL PCR SCREEN
MRSA, PCR: NEGATIVE
Staphylococcus aureus: NEGATIVE

## 2016-09-04 MED ORDER — METHOCARBAMOL 750 MG PO TABS
1500.0000 mg | ORAL_TABLET | Freq: Four times a day (QID) | ORAL | Status: DC
  Administered 2016-09-04 – 2016-09-08 (×16): 1500 mg via ORAL
  Filled 2016-09-04 (×20): qty 2

## 2016-09-04 NOTE — Progress Notes (Addendum)
Patient ID: Shannon [name], female   DOB: Jun 23, 1955, 61 y.o.   MRN: [id number]   LOS: 1 day   Subjective: Doing well enough this morning. Pain meds don't last long enough.   Objective: Vital signs in last 24 hours: Temp:  [97.9 F (36.6 C)-99.2 F (37.3 C)] 99.2 F (37.3 C) (05/25 0408) Pulse Rate:  [49-66] 66 (05/25 0408) Resp:  [13-24] 18 (05/25 0408) BP: (116-140)/(63-107) 116/64 (05/25 0408) SpO2:  [95 %-100 %] 100 % (05/25 0408) Weight:  [78.5 kg (173 lb)] 78.5 kg (173 lb) (05/24 1122) Last BM Date: 09/03/16   Physical Exam General appearance: alert and no distress  LLE No traumatic wounds, ecchymosis, or rash  In Buck's traction  Sens DPN, SPN, TN intact  Motor EHL, ext, flex, evers 5/5  DP 2+, no significant edema    Assessment/Plan: Fall Distal left femur fx -- CT showed extension to articular surface but will try for IMN. Plan for surgery tomorrow with Dr. Stann Mainland.  May have diet today. I have increased and scheduled her Robaxin to see if that will help with pain control. Tobacco use -- Encouraged cessation EtOH use HTN -- Home meds    Lisette Abu, PA-C Orthopedic Surgery [phone] 09/04/2016   Patient seen and examined.  Reviewed plan with her Friday am.  To OR Saturday am for ORIF

## 2016-09-04 NOTE — Consult Note (Signed)
[facility]TH[facility]AEDIC [facility]  REQUESTING PHYSICIAN: Paralee Cancel, MD  PCP:  Boykin Nearing, MD  Chief Complaint: Left distal femur fracture  HPI: Shannon [name] is a 61 y.o. female who complains of  left distal femur fracture following a fall on her hardwood floors yesterday afternoon. She had an angular deformity type injury to the left knee that resulted in a distal femur fracture. She was evaluated in the emergency department yesterday and determined to have an operative fracture. A partner Dr. Alvan Dame admitted her service and has asked me to take over management of her fractures.  She states that she is on 100% disability for back injuries and surgeries. At her baseline she walks with a cane. She does not live alone she lives with a female friend. She is independent with all ADLs.  She states she's having pain and muscle cramps otherwise denies radicular pain or numbness in the left lower extremity. She has a history of prior trauma to the left knee with a tibial plateau fracture that was fixed years ago with screws.  Past Medical History:  Diagnosis Date  . Dizziness    occasionally  . Hyperlipidemia    takes Tricor daily  . Hypertension    takes Lisinopril and Metoprolol daily  . Numbness    stinging in right hand  . Peripheral edema    takes lasix daily  . Ventral hernia    Past Surgical History:  Procedure Laterality Date  . ABDOMINAL HYSTERECTOMY    . BACK SURGERY  09/01/2010   L4-5 lumbar decompression including laminotomy, facetectomy,   . colonscopy    . FRACTURE SURGERY     left leg  . INSERTION OF MESH N/A 05/24/2013   Procedure: INSERTION OF MESH;  Surgeon: Imogene Burn. Georgette Dover, MD;  Location: [place];  Service: General;  Laterality: N/A;  . UTERINE FIBROID SURGERY    . VENTRAL HERNIA REPAIR N/A 05/24/2013   Procedure: OPEN REPAIR EPIGASTRIC VENTRAL HERNIA;  Surgeon: Imogene Burn. Georgette Dover, MD;  Location: [place] [facility];  Service: General;  Laterality: N/A;   Social History   Social  History  . Marital status: Legally Separated    Spouse name: N/A  . Number of children: N/A  . Years of education: N/A   Social History Main Topics  . Smoking status: Never Smoker  . Smokeless tobacco: Never Used  . Alcohol use 1.2 oz/week    2 Cans of beer per week  . Drug use: No  . Sexual activity: Yes    Birth control/ protection: Surgical   Other Topics Concern  . None   Social History Narrative   ** Merged History Encounter **       Family History  Problem Relation Age of Onset  . Stroke Mother   . Breast cancer Sister    Allergies  Allergen Reactions  . Morphine And Related     Itch   Prior to Admission medications   Medication Sig Start Date End Date Taking? Authorizing Provider  acetaminophen-codeine (TYLENOL #3) 300-30 MG tablet Take 1 tablet by mouth 2 (two) times daily. 05/04/16  Yes Funches, Josalyn, MD  fenofibrate (TRICOR) 48 MG tablet Take 1 tablet (48 mg total) by mouth daily. 02/11/16  Yes Funches, Josalyn, MD  hydrALAZINE (APRESOLINE) 50 MG tablet Take 1 tablet (50 mg total) by mouth 3 (three) times daily. 05/04/16  Yes Funches, Josalyn, MD  lisinopril (PRINIVIL,ZESTRIL) 40 MG tablet Take 1 tablet (40 mg total) by mouth daily. 02/11/16  Yes Funches, Josalyn, MD  meloxicam (MOBIC) 15 MG tablet Take 1 tablet (15 mg total) by mouth daily. 02/11/16  Yes Funches, Josalyn, MD  metoprolol succinate (TOPROL-XL) 100 MG 24 hr tablet Take 1 tablet (100 mg total) by mouth daily. Take with or immediately following a meal. 02/11/16  Yes Funches, Josalyn, MD  amoxicillin (AMOXIL) 875 MG tablet Take 1 tablet (875 mg total) by mouth 2 (two) times daily. Patient not taking: Reported on 09/03/2016 05/04/16   Boykin Nearing, MD  cyclobenzaprine (FLEXERIL) 10 MG tablet Take 1 tablet (10 mg total) by mouth at bedtime. Patient not taking: Reported on 09/03/2016 08/09/15   Boykin Nearing, MD   Ct Femur Left Wo Contrast  Result Date: 09/03/2016 CLINICAL DATA:  Femur fracture.  CT to exclude articular involvement. Initial encounter. EXAM: CT OF THE LOWER LEFT EXTREMITY WITHOUT CONTRAST TECHNIQUE: Multidetector CT imaging of the lower left extremity was performed according to the standard protocol. COMPARISON:  Radiography from earlier today FINDINGS: Bones/Joint/Cartilage Predominately transverse fracture involving the distal diaphysis of the femur. There is comminution with multiple branching fractures extending distally to the level of the knee joint, extending into the intracondylar notch and the medial femoral condyle anteriorly. No displacement along the articular surface. Remote proximal tibia fracture with screws that are partly visualized. Advanced tricompartmental osteoarthritis of the knee. Large lipohemarthrosis. Ligaments Suboptimally assessed by CT. Muscles and Tendons The extensor mechanism appears to be intact.  No malalignment. Large intramuscular hematoma in the anterior compartment about the fracture, commonly seen. Soft tissues Stranding/ hemorrhage in the popliteal fossa. No bone fragments or distortion along the neurovascular bundle. No bone fragments impinging on the SFA. IMPRESSION: 1. Comminuted distal femur fracture extending from the distal diaphysis to the intercondylar notch/medial femoral condyle. No displacement along the articular surface. 2. Tricompartmental knee osteoarthritis. Prior proximal tibia fracture and repair. Electronically Signed   By: Monte Fantasia M.D.   On: 09/03/2016 17:37   Dg Chest Port 1 View  Result Date: 09/03/2016 CLINICAL DATA:  Preop EXAM: PORTABLE CHEST 1 VIEW COMPARISON:  05/22/2013 FINDINGS: Heart size upper normal. Mild vascular congestion without edema or effusion. Mild bibasilar airspace disease most likely atelectasis. IMPRESSION: Mild vascular congestion without edema Mild bibasilar atelectasis. Electronically Signed   By: Franchot Gallo M.D.   On: 09/03/2016 14:09   Dg Femur Min 2 Views Left  Result Date:  09/03/2016 CLINICAL DATA:  Status post fall.  Left leg deformity. EXAM: LEFT FEMUR 2 VIEWS COMPARISON:  None. FINDINGS: Comminuted transverse fracture of the distal femoral diaphysis with a oblique fracture cleft extending from the primary fracture cleft into the left femoral diametaphysis. Apex posterior and lateral angulation. 10 mm of posterior displacement. 12 mm of lateral displacement. No other fracture or dislocation.  Large likely hemarthrosis. Partially visualize is old lateral tibial plateau fracture transfixed with 2 cannulated screws. IMPRESSION: Comminuted fracture of the distal left femoral diaphysis extending into the metaphysis with apex posterior and lateral angulation. Electronically Signed   By: Kathreen Devoid   On: 09/03/2016 13:30    Positive ROS: All other systems have been reviewed and were otherwise negative with the exception of those mentioned in the HPI and as above.  Physical Exam: General: Alert, no acute distress Cardiovascular: No pedal edema Respiratory: No cyanosis, no use of accessory musculature GI: No organomegaly, abdomen is soft and non-tender Skin: No lesions in the area of chief complaint Neurologic: Sensation intact distally Psychiatric: Patient is competent for consent with normal mood  and affect Lymphatic: No axillary or cervical lymphadenopathy  MUSCULOSKELETAL:  Left lower extremity:  Buck's traction off the end of the bed. Otherwise tender to palpation about the knee mild knee joint effusion noted. Distally she has motor intact with EHL/FHL/tib ant/GS CC. Sensation intact to light touch in the deep and superficial peroneal nerves as well as sural and saphenous nerves and the tibial nerves. 2+ dorsalis pedis pulse.  Assessment: Closed left distal femur fracture.  Plan: -I discussed with the patient that our recommendation is for operative intervention and fixation of the left distal femur fracture. Our plan will be for intramedullary nail fixation  versus possible laterally based plating depending on if the articular block remains nondisplaced at the end onset of nailing. -I reviewed the risk benefits and indications of the procedure with the patient and she did provide informed consent for the procedure. -The risks, benefits, and alternatives were discussed with the patient. There are risks associated with the surgery including, but not limited to, problems with anesthesia (death), infection, differences in leg length/angulation/rotation, fracture of bones, loosening or failure of implants, malunion, nonunion, hematoma (blood accumulation) which may require surgical drainage, blood clots, pulmonary embolism, nerve injury (foot drop), and blood vessel injury. The patient understands these risks and elects to proceed. -Based on the intra-articular nature of this fracture will likely need to be nonweightbearing postoperatively for a minimum of 6-8 weeks. We will likely be able to begin knee range of motion however much sooner than that. -We will also plan on keeping her on postoperative chemical DVT prophylaxis for 1 month. -She'll be nothing by mouth tonight at midnight in preparation for surgery tomorrow morning.    Nicholes Stairs, MD Cell [phone]    09/04/2016 5:25 PM

## 2016-09-04 NOTE — Progress Notes (Signed)
Orthopedic Tech Progress Note Patient Details:  Shannon [name] May 14, 1955 [id number]      [name] 09/04/2016, 12:46 AM

## 2016-09-05 ENCOUNTER — Inpatient Hospital Stay (HOSPITAL_COMMUNITY): Payer: Medicare Other

## 2016-09-05 ENCOUNTER — Encounter (HOSPITAL_COMMUNITY): Payer: Self-pay | Admitting: *Deleted

## 2016-09-05 ENCOUNTER — Inpatient Hospital Stay (HOSPITAL_COMMUNITY): Payer: Medicare Other | Admitting: Certified Registered Nurse Anesthetist

## 2016-09-05 ENCOUNTER — Encounter (HOSPITAL_COMMUNITY)
Admission: EM | Disposition: A | Discharge status: Home Health | Payer: Self-pay | Source: Home / Self Care | Attending: Orthopedic Surgery

## 2016-09-05 HISTORY — PX: FEMUR IM NAIL: SHX1597

## 2016-09-05 SURGERY — INSERTION, INTRAMEDULLARY ROD, FEMUR
Anesthesia: General | Site: Leg Upper | Laterality: Left

## 2016-09-05 MED ORDER — PHENYLEPHRINE 40 MCG/ML (10ML) SYRINGE FOR IV PUSH (FOR BLOOD PRESSURE SUPPORT)
PREFILLED_SYRINGE | INTRAVENOUS | Status: AC
  Filled 2016-09-05: qty 20

## 2016-09-05 MED ORDER — 0.9 % SODIUM CHLORIDE (POUR BTL) OPTIME
TOPICAL | Status: DC | PRN
  Administered 2016-09-05: 1000 mL

## 2016-09-05 MED ORDER — PROPOFOL 10 MG/ML IV BOLUS
INTRAVENOUS | Status: DC | PRN
  Administered 2016-09-05: 20 mg via INTRAVENOUS
  Administered 2016-09-05: 100 mg via INTRAVENOUS

## 2016-09-05 MED ORDER — ONDANSETRON HCL 4 MG PO TABS: 4.0000 mg | ORAL_TABLET | Freq: Four times a day (QID) | ORAL | Status: DC | PRN

## 2016-09-05 MED ORDER — ONDANSETRON HCL 4 MG/2ML IJ SOLN: 4.0000 mg | Freq: Four times a day (QID) | INTRAMUSCULAR | Status: DC | PRN

## 2016-09-05 MED ORDER — PROPOFOL 10 MG/ML IV BOLUS
INTRAVENOUS | Status: AC
  Filled 2016-09-05: qty 40

## 2016-09-05 MED ORDER — PHENYLEPHRINE 40 MCG/ML (10ML) SYRINGE FOR IV PUSH (FOR BLOOD PRESSURE SUPPORT)
PREFILLED_SYRINGE | INTRAVENOUS | Status: DC | PRN
  Administered 2016-09-05 (×5): 40 ug via INTRAVENOUS
  Administered 2016-09-05: 80 ug via INTRAVENOUS
  Administered 2016-09-05 (×4): 40 ug via INTRAVENOUS
  Administered 2016-09-05: 80 ug via INTRAVENOUS
  Administered 2016-09-05 (×3): 40 ug via INTRAVENOUS

## 2016-09-05 MED ORDER — ONDANSETRON HCL 4 MG/2ML IJ SOLN: 4.0000 mg | Freq: Once | INTRAMUSCULAR | Status: DC | PRN

## 2016-09-05 MED ORDER — DEXAMETHASONE SODIUM PHOSPHATE 10 MG/ML IJ SOLN
INTRAMUSCULAR | Status: AC
  Filled 2016-09-05: qty 1

## 2016-09-05 MED ORDER — CEFAZOLIN SODIUM-DEXTROSE 2-4 GM/100ML-% IV SOLN
2.0000 g | Freq: Four times a day (QID) | INTRAVENOUS | Status: AC
  Administered 2016-09-05 – 2016-09-06 (×3): 2 g via INTRAVENOUS
  Filled 2016-09-05 (×4): qty 100

## 2016-09-05 MED ORDER — ACETAMINOPHEN 650 MG RE SUPP
650.0000 mg | Freq: Four times a day (QID) | RECTAL | Status: DC | PRN
Start: 2016-09-05 — End: 2016-09-09

## 2016-09-05 MED ORDER — SUGAMMADEX SODIUM 200 MG/2ML IV SOLN
INTRAVENOUS | Status: DC | PRN
  Administered 2016-09-05: 100 mg via INTRAVENOUS
  Administered 2016-09-05: 50 mg via INTRAVENOUS

## 2016-09-05 MED ORDER — HYDROMORPHONE HCL 1 MG/ML IJ SOLN
0.2500 mg | INTRAMUSCULAR | Status: DC | PRN
  Administered 2016-09-05 (×3): 0.5 mg via INTRAVENOUS

## 2016-09-05 MED ORDER — LIDOCAINE 2% (20 MG/ML) 5 ML SYRINGE
INTRAMUSCULAR | Status: DC | PRN
  Administered 2016-09-05: 100 mg via INTRAVENOUS

## 2016-09-05 MED ORDER — METOCLOPRAMIDE HCL 5 MG/ML IJ SOLN: 5.0000 mg | Freq: Three times a day (TID) | INTRAMUSCULAR | Status: DC | PRN

## 2016-09-05 MED ORDER — FENTANYL CITRATE (PF) 100 MCG/2ML IJ SOLN
INTRAMUSCULAR | Status: DC | PRN
  Administered 2016-09-05 (×2): 25 ug via INTRAVENOUS
  Administered 2016-09-05: 50 ug via INTRAVENOUS
  Administered 2016-09-05 (×2): 25 ug via INTRAVENOUS
  Administered 2016-09-05: 150 ug via INTRAVENOUS

## 2016-09-05 MED ORDER — PHENYLEPHRINE HCL 10 MG/ML IJ SOLN: INTRAVENOUS | Status: DC | PRN

## 2016-09-05 MED ORDER — ROCURONIUM BROMIDE 10 MG/ML (PF) SYRINGE
PREFILLED_SYRINGE | INTRAVENOUS | Status: DC | PRN
  Administered 2016-09-05: 50 mg via INTRAVENOUS

## 2016-09-05 MED ORDER — LIDOCAINE 2% (20 MG/ML) 5 ML SYRINGE
INTRAMUSCULAR | Status: AC
  Filled 2016-09-05: qty 5

## 2016-09-05 MED ORDER — LACTATED RINGERS IV SOLN
INTRAVENOUS | Status: DC
  Administered 2016-09-05 (×2): via INTRAVENOUS

## 2016-09-05 MED ORDER — ROCURONIUM BROMIDE 10 MG/ML (PF) SYRINGE
PREFILLED_SYRINGE | INTRAVENOUS | Status: AC
  Filled 2016-09-05: qty 5

## 2016-09-05 MED ORDER — EPHEDRINE 5 MG/ML INJ
INTRAVENOUS | Status: AC
  Filled 2016-09-05: qty 10

## 2016-09-05 MED ORDER — FENTANYL CITRATE (PF) 250 MCG/5ML IJ SOLN
INTRAMUSCULAR | Status: AC
  Filled 2016-09-05: qty 5

## 2016-09-05 MED ORDER — MEPERIDINE HCL 25 MG/ML IJ SOLN: 6.2500 mg | INTRAMUSCULAR | Status: DC | PRN

## 2016-09-05 MED ORDER — HYDROMORPHONE HCL 1 MG/ML IJ SOLN
INTRAMUSCULAR | Status: AC
  Administered 2016-09-05: 0.5 mg via INTRAVENOUS
  Filled 2016-09-05: qty 1

## 2016-09-05 MED ORDER — EPHEDRINE SULFATE-NACL 50-0.9 MG/10ML-% IV SOSY
PREFILLED_SYRINGE | INTRAVENOUS | Status: DC | PRN
  Administered 2016-09-05: 10 mg via INTRAVENOUS

## 2016-09-05 MED ORDER — MIDAZOLAM HCL 5 MG/5ML IJ SOLN
INTRAMUSCULAR | Status: DC | PRN
  Administered 2016-09-05: 2 mg via INTRAVENOUS

## 2016-09-05 MED ORDER — SUGAMMADEX SODIUM 200 MG/2ML IV SOLN
INTRAVENOUS | Status: AC
  Filled 2016-09-05: qty 2

## 2016-09-05 MED ORDER — HYDROMORPHONE HCL 1 MG/ML IJ SOLN
INTRAMUSCULAR | Status: AC
  Administered 2016-09-05: 0.5 mg via INTRAVENOUS
  Filled 2016-09-05: qty 0.5

## 2016-09-05 MED ORDER — ENOXAPARIN SODIUM 30 MG/0.3ML ~~LOC~~ SOLN
30.0000 mg | SUBCUTANEOUS | Status: DC
  Administered 2016-09-06 – 2016-09-08 (×3): 30 mg via SUBCUTANEOUS
  Filled 2016-09-05 (×4): qty 0.3

## 2016-09-05 MED ORDER — METOCLOPRAMIDE HCL 5 MG PO TABS: 5.0000 mg | ORAL_TABLET | Freq: Three times a day (TID) | ORAL | Status: DC | PRN

## 2016-09-05 MED ORDER — OXYCODONE HCL 5 MG PO TABS: 5.0000 mg | ORAL_TABLET | ORAL | 0 refills | Status: DC | PRN

## 2016-09-05 MED ORDER — ONDANSETRON HCL 4 MG/2ML IJ SOLN
INTRAMUSCULAR | Status: AC
  Filled 2016-09-05: qty 2

## 2016-09-05 MED ORDER — ACETAMINOPHEN 325 MG PO TABS: 650.0000 mg | ORAL_TABLET | Freq: Four times a day (QID) | ORAL | Status: DC | PRN

## 2016-09-05 MED ORDER — ONDANSETRON HCL 4 MG/2ML IJ SOLN
INTRAMUSCULAR | Status: DC | PRN
  Administered 2016-09-05: 4 mg via INTRAVENOUS

## 2016-09-05 MED ORDER — DEXAMETHASONE SODIUM PHOSPHATE 10 MG/ML IJ SOLN
INTRAMUSCULAR | Status: DC | PRN
  Administered 2016-09-05: 10 mg via INTRAVENOUS

## 2016-09-05 MED ORDER — CEFAZOLIN SODIUM 1 G IJ SOLR
INTRAMUSCULAR | Status: DC | PRN
  Administered 2016-09-05: 2 g via INTRAMUSCULAR

## 2016-09-05 MED ORDER — ONDANSETRON 4 MG PO TBDP: 4.0000 mg | ORAL_TABLET | Freq: Three times a day (TID) | ORAL | 0 refills | Status: DC | PRN

## 2016-09-05 MED ORDER — ENOXAPARIN SODIUM 40 MG/0.4ML ~~LOC~~ SOLN: 40.0000 mg | SUBCUTANEOUS | 0 refills | Status: DC

## 2016-09-05 MED ORDER — MIDAZOLAM HCL 2 MG/2ML IJ SOLN
INTRAMUSCULAR | Status: AC
  Filled 2016-09-05: qty 2

## 2016-09-05 SURGICAL SUPPLY — 48 items
ALCOHOL 70% 16 OZ (MISCELLANEOUS) ×3 IMPLANT
BIT DRILL CALIBRATED 4.3MMX365 (DRILL) ×1 IMPLANT
BIT DRILL CROWE PNT TWST 4.5MM (DRILL) ×2 IMPLANT
BNDG COHESIVE 4X5 TAN STRL (GAUZE/BANDAGES/DRESSINGS) ×6 IMPLANT
BNDG COHESIVE 6X5 TAN STRL LF (GAUZE/BANDAGES/DRESSINGS) ×6 IMPLANT
COVER PERINEAL POST (MISCELLANEOUS) ×3 IMPLANT
COVER SURGICAL LIGHT HANDLE (MISCELLANEOUS) ×3 IMPLANT
DRAPE C-ARMOR (DRAPES) ×3 IMPLANT
DRAPE HALF SHEET 40X57 (DRAPES) ×9 IMPLANT
DRAPE INCISE IOBAN 66X45 STRL (DRAPES) ×6 IMPLANT
DRAPE ORTHO SPLIT 77X108 STRL (DRAPES) ×4
DRAPE STERI IOBAN 125X83 (DRAPES) ×9 IMPLANT
DRAPE SURG ORHT 6 SPLT 77X108 (DRAPES) ×2 IMPLANT
DRILL CALIBRATED 4.3MMX365 (DRILL) ×3
DRILL CROWE POINT TWIST 4.5MM (DRILL) ×6
DRSG TEGADERM 2-3/8X2-3/4 SM (GAUZE/BANDAGES/DRESSINGS) IMPLANT
DRSG TEGADERM 4X4.75 (GAUZE/BANDAGES/DRESSINGS) ×6 IMPLANT
DURAPREP 26ML APPLICATOR (WOUND CARE) ×6 IMPLANT
ELECT REM PT RETURN 9FT ADLT (ELECTROSURGICAL) ×3
ELECTRODE REM PT RTRN 9FT ADLT (ELECTROSURGICAL) ×1 IMPLANT
FACESHIELD WRAPAROUND (MASK) IMPLANT
GAUZE SPONGE 4X4 12PLY STRL LF (GAUZE/BANDAGES/DRESSINGS) ×3 IMPLANT
GAUZE XEROFORM 1X8 LF (GAUZE/BANDAGES/DRESSINGS) ×3 IMPLANT
GLOVE BIO SURGEON STRL SZ7.5 (GLOVE) ×6 IMPLANT
GLOVE BIOGEL PI IND STRL 8 (GLOVE) ×1 IMPLANT
GLOVE BIOGEL PI INDICATOR 8 (GLOVE) ×2
GOWN STRL REUS W/ TWL LRG LVL3 (GOWN DISPOSABLE) ×2 IMPLANT
GOWN STRL REUS W/ TWL XL LVL3 (GOWN DISPOSABLE) ×1 IMPLANT
GOWN STRL REUS W/TWL LRG LVL3 (GOWN DISPOSABLE) ×4
GOWN STRL REUS W/TWL XL LVL3 (GOWN DISPOSABLE) ×2
GUIDEWIRE BEAD TIP (WIRE) ×3 IMPLANT
KIT BASIN OR (CUSTOM PROCEDURE TRAY) ×3 IMPLANT
KIT ROOM TURNOVER OR (KITS) ×3 IMPLANT
MANIFOLD NEPTUNE II (INSTRUMENTS) ×3 IMPLANT
NAIL FEM RETRO 10.5X380 (Nail) ×3 IMPLANT
NS IRRIG 1000ML POUR BTL (IV SOLUTION) ×3 IMPLANT
PACK GENERAL/GYN (CUSTOM PROCEDURE TRAY) ×3 IMPLANT
PAD ARMBOARD 7.5X6 YLW CONV (MISCELLANEOUS) ×6 IMPLANT
PIN GUIDE 3.2 903003004 (MISCELLANEOUS) ×3 IMPLANT
SCREW CORT TI DBL LEAD 5X30 (Screw) ×3 IMPLANT
SCREW CORT TI DBL LEAD 5X32 (Screw) ×3 IMPLANT
SCREW CORT TI DBL LEAD 5X60 (Screw) ×3 IMPLANT
SCREW CORT TI DBL LEAD 5X75 (Screw) ×3 IMPLANT
SCREW CORT TI DBLE LEAD 5X52 (Screw) ×3 IMPLANT
SPONGE GAUZE 4X4 12PLY STER LF (GAUZE/BANDAGES/DRESSINGS) ×3 IMPLANT
STAPLER VISISTAT 35W (STAPLE) ×3 IMPLANT
SUT VIC AB 2-0 CT1 27 (SUTURE) ×4
SUT VIC AB 2-0 CT1 TAPERPNT 27 (SUTURE) ×2 IMPLANT

## 2016-09-05 NOTE — Anesthesia Preprocedure Evaluation (Signed)
Anesthesia Evaluation  Patient identified by MRN, date of birth, ID band Patient awake    Reviewed: Allergy & Precautions, NPO status , Patient's Chart, lab work & pertinent test results  Airway Mallampati: I  TM Distance: >3 FB Neck ROM: Full    Dental   Pulmonary    Pulmonary exam normal        Cardiovascular hypertension, Pt. on medications Normal cardiovascular exam     Neuro/Psych Depression    GI/Hepatic   Endo/Other    Renal/GU      Musculoskeletal   Abdominal   Peds  Hematology   Anesthesia Other Findings   Reproductive/Obstetrics                             Anesthesia Physical Anesthesia Plan  ASA: II  Anesthesia Plan: General   Post-op Pain Management:    Induction: Intravenous  Airway Management Planned: Oral ETT  Additional Equipment:   Intra-op Plan:   Post-operative Plan: Extubation in OR  Informed Consent: I have reviewed the patients History and Physical, chart, labs and discussed the procedure including the risks, benefits and alternatives for the proposed anesthesia with the patient or authorized representative who has indicated his/her understanding and acceptance.     Plan Discussed with: CRNA and Surgeon  Anesthesia Plan Comments:         Anesthesia Quick Evaluation

## 2016-09-05 NOTE — Discharge Instructions (Signed)
-  Nonweightbearing to the left lower extremity. -Maintain postoperative dressing until follow-up appointment. It is okay to shower with his bandage in place. Do not submerge under water. -Administer one Lovenox injection per day for 28 days postoperatively for prevention of blood clots. -Return to clinic to see M.D. in 2 weeks for wound check.

## 2016-09-05 NOTE — Anesthesia Procedure Notes (Signed)
Procedure Name: Intubation Date/Time: 09/05/2016 9:40 AM Performed by: Merdis Delay Pre-anesthesia Checklist: Patient identified, Emergency Drugs available, Suction available, Patient being monitored and Timeout performed Patient Re-evaluated:Patient Re-evaluated prior to inductionOxygen Delivery Method: Circle system utilized Preoxygenation: Pre-oxygenation with 100% oxygen Intubation Type: IV induction Ventilation: Mask ventilation without difficulty Laryngoscope Size: Mac and 3 Grade View: Grade I Tube size: 7.0 mm Number of attempts: 1 Airway Equipment and Method: Stylet Placement Confirmation: ETT inserted through vocal cords under direct vision,  positive ETCO2 and breath sounds checked- equal and bilateral Secured at: 22 cm Tube secured with: Tape Dental Injury: Teeth and Oropharynx as per pre-operative assessment

## 2016-09-05 NOTE — Transfer of Care (Signed)
Immediate Anesthesia Transfer of Care Note  Patient: Shannon [name]  Procedure(s) Performed: Procedure(s): INTRAMEDULLARY (IM) NAIL FEMORAL (Left)  Patient Location: PACU  Anesthesia Type:General  Level of Consciousness: awake, alert  and oriented  Airway & Oxygen Therapy: Patient Spontanous Breathing and Patient connected to nasal cannula oxygen  Post-op Assessment: Report given to RN and Post -op Vital signs reviewed and stable  Post vital signs: Reviewed and stable  Last Vitals:  Vitals:   09/05/16 0507 09/05/16 0845  BP: 137/74 130/75  Pulse: 66 75  Resp: 16   Temp: 37.2 C 37.6 C    Last Pain:  Vitals:   09/05/16 0845  TempSrc: Oral  PainSc: 5          Complications: No apparent anesthesia complications

## 2016-09-05 NOTE — Progress Notes (Signed)
Md on call page patient is having itching Awaiting for orders will continue to monitor. Arthor Captain LPN

## 2016-09-05 NOTE — Progress Notes (Signed)
Post op Xrays completed

## 2016-09-05 NOTE — Brief Op Note (Signed)
09/03/2016 - 09/05/2016  11:08 AM  PATIENT:  Shannon [name]  61 y.o. female  PRE-OPERATIVE DIAGNOSIS:  femur fx  POST-OPERATIVE DIAGNOSIS:  femur fx  PROCEDURE:  Procedure(s): INTRAMEDULLARY (IM) NAIL FEMORAL (Left)  SURGEON:  Surgeon(s) and Role:    * Stann Mainland, Elly Modena, MD - Primary  PHYSICIAN ASSISTANT:   ASSISTANTS: none   ANESTHESIA:   general  EBL:  Total I/O In: 1150 [I.V.:1150] Out: 850 [Urine:800; Blood:50]  BLOOD ADMINISTERED:none  DRAINS: none   LOCAL MEDICATIONS USED:  NONE  SPECIMEN:  No Specimen  DISPOSITION OF SPECIMEN:  N/A  COUNTS:  YES  TOURNIQUET:  * No tourniquets in log *  DICTATION: .Note written in EPIC  PLAN OF CARE: Admit to inpatient   PATIENT DISPOSITION:  PACU - hemodynamically stable.   Delay start of Pharmacological VTE agent (>24hrs) due to surgical blood loss or risk of bleeding: not applicable

## 2016-09-05 NOTE — Anesthesia Postprocedure Evaluation (Signed)
Anesthesia Post Note  Patient: Shannon [name]  Procedure(s) Performed: Procedure(s) (LRB): INTRAMEDULLARY (IM) NAIL FEMORAL (Left)  Patient location during evaluation: PACU Anesthesia Type: General Level of consciousness: awake and alert Pain management: pain level controlled Vital Signs Assessment: post-procedure vital signs reviewed and stable Respiratory status: spontaneous breathing, nonlabored ventilation, respiratory function stable and patient connected to nasal cannula oxygen Cardiovascular status: blood pressure returned to baseline and stable Postop Assessment: no signs of nausea or vomiting Anesthetic complications: no       Last Vitals:  Vitals:   09/05/16 1117 09/05/16 1118  BP:  (!) 155/84  Pulse: 81 81  Resp:  11  Temp:  36.8 C    Last Pain:  Vitals:   09/05/16 0845  TempSrc: Oral  PainSc: 5                  Osmond Steckman DAVID

## 2016-09-05 NOTE — Op Note (Signed)
Date of Surgery: 09/05/2016  INDICATIONS: Shannon [name] is a 61 y.o.-year-old female with a left distal femur comminuted fracture. The patient sustained a ground-level fall 2 days prior to the procedure when she slipped on her hardwood floors at home. She has a history of prior proximal tibia ORIF which has given her some preoperative knee snip stiffness and pain intermittently. Otherwise she did ambulate prior to the fall without assistive devices. She is on permanent disability due to some lumbar spine surgery and issues secondary to that. She was indicated for operative intervention based on the displaced nature of the fracture as well as the intra-articular extension of the distal segment. We did have a thorough conversation about the risks benefits and indications of intramedullary nailing of the left femur. The risks include but are not limited to bleeding, infection, malunion, nonunion, pain, need for further surgery, damage to blood vessels and/or nerves. Also the risk of blood clots and anesthetic risk were discussed.;  The patient did consent to the procedure after discussion of the risks and benefits.  PREOPERATIVE DIAGNOSIS: Left femur distal fracture with intra-articular extension, closed.  POSTOPERATIVE DIAGNOSIS: Same.  PROCEDURE: Intramedullary nailing of left distal femur fracture.  SURGEON: Geralynn Rile, M.D.  ASSIST: None.  ANESTHESIA:  general  IV FLUIDS AND URINE: See anesthesia.  ESTIMATED BLOOD LOSS: 100 mL.  IMPLANTS:  [employer] [place] retrograde nail 10.5 mm x 38 cm Five 5 mm interlocking screws.  DRAINS: None  COMPLICATIONS: None.  DESCRIPTION OF PROCEDURE: The patient was brought to the operating room and placed supine on the operating table.  The patient had been signed prior to the procedure and this was documented. The patient had the anesthesia placed by the anesthesiologist.  A time-out was performed to confirm that this was the correct patient, site, side  and location. The patient did receive antibiotics prior to the incision and was re-dosed during the procedure as needed at indicated intervals.  A tourniquet was not placed.  The patient had the operative extremity prepped and draped in the standard surgical fashion.      A trans-patellar tendon approach was used to access the knee joint. The patellar tendon was incised in line with its fibers and a peritenon layer was preserved for closure upon the end of the case.  Due to an intra-condylar split noted on preoperative CT scan, we elected to use a periarticular reduction clamp to hold that nondisplaced segment intact during the reaming and insertion of the nail.  The adequacy of this clamp placement and reduction of the intercondylar split were confirmed with AP and lateral radiographs.  We next turned our attention to starting the nailing process. The starting point was first found with the guide wire and this was inserted under fluoroscopic visualization. The guide wire was placed partially down into the femur and then the opening reamer was placed over this.  All radiographs were confirmed throughout the procedure on both AP and lateral views. The ball-tipped guide wire was then placed up to the proximal portion of the femur in the proper location and the measuring guide was used to measure off of this after the femur was brought out to length. Sequential reaming was then performed to give some chatter, while maintaining reduction of the fracture.  The nail itself was then inserted over the wire and then the guide wire was removed. The distal interlocking screws were placed first through the jig.  Again, all radiographs were confirmed on both AP and lateral  views.  The screws were placed through the jig in the standard fashion, first incising the skin, subcutaneous tissue, and fascia, then spreading with a clamp.  The drill was placed and confirmed fluoroscopically, followed by measuring, then placing the  screws by hand.    Attention was then turned to the proximall interlocking screws. The AP x-ray was used to get the perfect circles view, then an incision was made through the skin and subcutaneous tissue and fascia followed by drilling, measuring with a depth gauge, then placing the screws by hand. Final x-rays were taken on both AP and lateral views to confirm all of the screw placements.  An internal rotation view was taken at the femoral neck to confirm integrity of the neck.  The wounds were copiously irrigated with saline and then the patellar tendon was closed with a #1 Vicryl stitch in a figure-of-eight fashion. Next the peritenon layer was closed with a running locking 2-0 Vicryl. Subcutaneous tissue was reapproximated with 2-0 Vicryl. Skin was closed with staples. The wounds were cleaned and dried a final time and a sterile dressing was placed.    POSTOPERATIVE PLAN: The patient will be nonweightbearing to the left lower extremity for approximately 8 weeks due to the intra-articular nature of this fracture. We will allow full passive and active range of motion at the knee beginning immediately. She will be on Lovenox for prevention of blood clots for approximately 4 weeks postoperatively. Postoperative bandages can remain in place for 2 weeks as long as they do not become saturated. I will see her back in the office in 2 weeks for a wound check.

## 2016-09-06 NOTE — Evaluation (Signed)
Physical Therapy Evaluation Patient Details Name: Shannon [name] MRN: [id number] DOB: [birth year] Today's Date: 09/06/2016   History of Present Illness  61 yo f s/p left femur fx. Underwent IM nail on 09/05/16. PMH significant for HTN, L4-5 laminectomy.   Clinical Impression  Patient is s/p above surgery resulting in functional limitations due to the deficits listed below (see PT Problem List).  Patient will benefit from skilled PT to increase their independence and safety with mobility to allow discharge hopefully to home.  Pt feels she can use a WC in her apartment and would prefer to go home rather than SNF.        Follow Up Recommendations Home health PT;Supervision for mobility/OOB    Equipment Recommendations  Rolling walker with 5" wheels;Wheelchair (measurements PT)    Recommendations for Other Services       Precautions / Restrictions Precautions Precautions: Fall Restrictions Weight Bearing Restrictions: Yes LLE Weight Bearing: Touchdown weight bearing      Mobility  Bed Mobility Overal bed mobility: Needs Assistance Bed Mobility: Supine to Sit     Supine to sit: Min assist     General bed mobility comments: needed assist to move LLE  Transfers Overall transfer level: Needs assistance Equipment used: Rolling walker (2 wheeled) Transfers: Sit to/from [employer] Sit to Stand: Min assist   Squat pivot transfers: Min assist     General transfer comment: Educational cues for hand placement and technique.  Squat pivot was towards right side oonto BSC  Ambulation/Gait Ambulation/Gait assistance: Min assist Ambulation Distance (Feet): 4 Feet Assistive device: Rolling walker (2 wheeled)   Gait velocity: decreased   General Gait Details: pt able to maintain NWB on LLE.    Stairs            Wheelchair Mobility    Modified Rankin (Stroke Patients Only)       Balance                                              Pertinent Vitals/Pain Pain Assessment: 0-10 Pain Score: 10-Worst pain ever Pain Location: left leg Pain Descriptors / Indicators: Constant;Crying;Grimacing Pain Intervention(s): Limited activity within patient's tolerance;Monitored during session    Home Living Family/patient expects to be discharged to:: Private residence Living Arrangements: Other (Comment) (Boyfriend) Available Help at Discharge: Available PRN/intermittently (boyfriend there most of the time, except he receives HD 3xwk) Type of Home: Apartment Home Access: Level entry     Home Layout: One level Home Equipment: Cane - single point;Bedside commode      Prior Function Level of Independence: Independent with assistive device(s)         Comments: used cane     Hand Dominance        Extremity/Trunk Assessment   Upper Extremity Assessment Upper Extremity Assessment: Defer to OT evaluation    Lower Extremity Assessment Lower Extremity Assessment: LLE deficits/detail LLE Deficits / Details: did not MMT due to recent surgery LLE: Unable to fully assess due to pain    Cervical / Trunk Assessment Cervical / Trunk Assessment: Normal  Communication   Communication: No difficulties  Cognition Arousal/Alertness: Awake/alert Behavior During Therapy: WFL for tasks assessed/performed Overall Cognitive Status: Within Functional Limits for tasks assessed  General Comments General comments (skin integrity, edema, etc.): No visitors present    Exercises     Assessment/Plan    PT Assessment Patient needs continued PT services  PT Problem List Decreased activity tolerance;Decreased mobility;Decreased knowledge of use of DME;Decreased knowledge of precautions;Pain       PT Treatment Interventions DME instruction;Gait training;Functional mobility training;Therapeutic exercise;Patient/family education    PT Goals (Current goals can be found in  the Care Plan section)  Acute Rehab PT Goals Patient Stated Goal: to be able to walk PT Goal Formulation: With patient Time For Goal Achievement: 09/12/16 Potential to Achieve Goals: Good    Frequency Min 5X/week   Barriers to discharge        Co-evaluation               AM-PAC PT "6 Clicks" Daily Activity  Outcome Measure Difficulty turning over in bed (including adjusting bedclothes, sheets and blankets)?: Total Difficulty moving from lying on back to sitting on the side of the bed? : Total Difficulty sitting down on and standing up from a chair with arms (e.g., wheelchair, bedside commode, etc,.)?: Total Help needed moving to and from a bed to chair (including a wheelchair)?: A Little Help needed walking in hospital room?: A Lot Help needed climbing 3-5 steps with a railing? : Total 6 Click Score: 9    End of Session Equipment Utilized During Treatment: Gait belt Activity Tolerance: Patient limited by pain Patient left: Other (comment) (on [facility] with rehab tech and OT coming in to begin evaluation)   PT Visit Diagnosis: Unsteadiness on feet (R26.81)    Time: 1030-1053 PT Time Calculation (min) (ACUTE ONLY): 23 min   Charges:   PT Evaluation $PT Eval Moderate Complexity: 1 Procedure PT Treatments $Gait Training: 8-22 mins   PT G CodesLavonia Dana, PT  [phone] 09/06/2016   Melvern Banker 09/06/2016, 11:08 AM

## 2016-09-06 NOTE — Progress Notes (Signed)
Dictation #1 [id number]  [id number] Subjective: 1 Day Post-Op Procedure(s) (LRB): INTRAMEDULLARY (IM) NAIL FEMORAL (Left) Patient reports pain as moderate.    Objective: Vital signs in last 24 hours: Temp:  [97.7 F (36.5 C)-101.4 F (38.6 C)] 100.1 F (37.8 C) (05/27 0421) Pulse Rate:  [64-93] 64 (05/27 0421) Resp:  [10-18] 18 (05/27 0421) BP: (118-157)/(67-102) 118/67 (05/27 0421) SpO2:  [93 %-100 %] 97 % (05/27 0421)  Intake/Output from previous day: 05/26 0701 - 05/27 0700 In: 2762 [I.V.:2562; IV Piggyback:200] Out: 3700 [Urine:3650; Blood:50] Intake/Output this shift: No intake/output data recorded.   Recent Labs  09/03/16 1440  HGB 12.4    Recent Labs  09/03/16 1440  WBC 6.5  RBC 5.32*  HCT 39.7  PLT 284    Recent Labs  09/03/16 1440  NA 143  K 4.1  CL 115*  CO2 23  BUN 18  CREATININE 1.12*  GLUCOSE 109*  CALCIUM 8.9   No results for input(s): LABPT, INR in the last 72 hours.  Neurovascular intact No cellulitis present Compartment soft  Assessment/Plan: 1 Day Post-Op Procedure(s) (LRB): INTRAMEDULLARY (IM) NAIL FEMORAL (Left) Advance diet Up with therapy  Katera Rybka V 09/06/2016, 8:45 AM

## 2016-09-06 NOTE — Evaluation (Signed)
Occupational Therapy Evaluation Patient Details Name: Shannon [name] MRN: [id number] DOB: 08-08-1955 Today's Date: 09/06/2016    History of Present Illness 61 yo f s/p left femur fx. Underwent IM nail on 09/05/16. PMH significant for HTN, L4-5 laminectomy.    Clinical Impression   PTA, pt was independent and living with her boyfriend. Currently, pt performing functional mobility with Min A and LB ADLs with Mod A. Once re-educated, pt adhered to WB precautions throughout session. Pt would benefit from skilled acute OT to increased independence and safety with ADLs and functional mobility. Recommend dc home with HHOT to increase pt safety and independence at home.     Follow Up Recommendations  Home health OT;Supervision/Assistance - 24 hour (Pt desires to go home rather than to SNF)    Equipment Recommendations  Tub/shower bench    Recommendations for Other Services       Precautions / Restrictions Precautions Precautions: Fall Restrictions Weight Bearing Restrictions: Yes LLE Weight Bearing: Touchdown weight bearing      Mobility Bed Mobility Overal bed mobility: Needs Assistance Bed Mobility: Supine to Sit     Supine to sit: Min assist     General bed mobility comments: Pt on BSC upon arrival  Transfers Overall transfer level: Needs assistance Equipment used: Rolling walker (2 wheeled) Transfers: Sit to/from [facility] Sit to Stand: Min assist;+2 safety/equipment Stand pivot transfers: Min assist;+2 safety/equipment Squat pivot transfers: Min assist     General transfer comment: Educational cues for hand placement and technique.  Stand pivot was towards Left side from [facility] to recliner    Balance Overall balance assessment: Needs assistance Sitting-balance support: Feet supported;No upper extremity supported Sitting balance-Leahy Scale: Fair     Standing balance support: During functional activity;Single extremity supported Standing  balance-Leahy Scale: Fair Standing balance comment: Able to maintain standing balance with single UE support while performing toilet hygiene                           ADL either performed or assessed with clinical judgement   ADL Overall ADL's : Needs assistance/impaired Eating/Feeding: Set up;Sitting   Grooming: Set up;Sitting   Upper Body Bathing: Set up;Sitting   Lower Body Bathing: Sit to/from stand;Moderate assistance   Upper Body Dressing : Set up;Sitting   Lower Body Dressing: Moderate assistance;Sit to/from stand   Toilet Transfer: Minimal assistance;+2 for safety/equipment;Cueing for sequencing;Cueing for safety;BSC;RW;Stand-pivot   Toileting- Clothing Manipulation and Hygiene: Minimal assistance;Sit to/from stand Toileting - Clothing Manipulation Details (indicate cue type and reason): Min A for standing balance. Pt performed toilet hygiene while managing gown     Functional mobility during ADLs: Minimal assistance;+2 for safety/equipment;Rolling walker General ADL Comments: Pt adhered to WB precautions throughout session. However, when asked initially on how much weight alllowed through her LLE, pt responds "I don't know."     Vision         Perception     Praxis      Pertinent Vitals/Pain Pain Assessment: Faces Faces Pain Scale: Hurts little more Pain Location: left leg Pain Descriptors / Indicators: Constant;Grimacing;Guarding Pain Intervention(s): Monitored during session     Hand Dominance Right   Extremity/Trunk Assessment Upper Extremity Assessment Upper Extremity Assessment: Overall WFL for tasks assessed   Lower Extremity Assessment Lower Extremity Assessment: Defer to PT evaluation LLE Deficits / Details: did not MMT due to recent surgery LLE: Unable to fully assess due to pain   Cervical / Trunk  Assessment Cervical / Trunk Assessment: Normal   Communication Communication Communication: No difficulties   Cognition  Arousal/Alertness: Awake/alert Behavior During Therapy: WFL for tasks assessed/performed Overall Cognitive Status: Within Functional Limits for tasks assessed                                     General Comments  No visitors present    Exercises     Shoulder Instructions      Home Living Family/patient expects to be discharged to:: Private residence Living Arrangements: Other (Comment) (Boyfriend) Available Help at Discharge: Available PRN/intermittently (boyfriend there most of the time, except he receives HD 3xwk) Type of Home: Apartment Home Access: Level entry     Home Layout: One level     Bathroom Shower/Tub: Teacher, early years/pre: Standard     Home Equipment: Cane - single point;Bedside commode          Prior Functioning/Environment Level of Independence: Independent with assistive device(s)        Comments: used cane        OT Problem List: Decreased range of motion;Decreased strength;Decreased activity tolerance;Impaired balance (sitting and/or standing);Decreased safety awareness;Decreased knowledge of use of DME or AE;Decreased knowledge of precautions;Pain      OT Treatment/Interventions: Self-care/ADL training;Therapeutic exercise;Energy conservation;DME and/or AE instruction;Therapeutic activities;Patient/family education    OT Goals(Current goals can be found in the care plan section) Acute Rehab OT Goals Patient Stated Goal: Go home OT Goal Formulation: With patient Time For Goal Achievement: 09/20/16 Potential to Achieve Goals: Good ADL Goals Pt Will Perform Lower Body Bathing: with set-up;with supervision;sit to/from stand;with adaptive equipment Pt Will Perform Lower Body Dressing: with set-up;with supervision;with adaptive equipment;sit to/from stand Pt Will Transfer to Toilet: ambulating;bedside commode;with min guard assist Pt Will Perform Toileting - Clothing Manipulation and hygiene: with min guard assist;sit  to/from stand Pt Will Perform Tub/Shower Transfer: Tub transfer;tub bench;with min guard assist;rolling walker;ambulating  OT Frequency: Min 2X/week   Barriers to D/C: Decreased caregiver support  Boy friend will be present but has dialysis three days a week and is unable to A physically       Co-evaluation              AM-PAC PT "6 Clicks" Daily Activity     Outcome Measure Help from another person eating meals?: None Help from another person taking care of personal grooming?: A Little Help from another person toileting, which includes using toliet, bedpan, or urinal?: A Little Help from another person bathing (including washing, rinsing, drying)?: A Lot Help from another person to put on and taking off regular upper body clothing?: None Help from another person to put on and taking off regular lower body clothing?: A Little 6 Click Score: 19   End of Session Equipment Utilized During Treatment: Gait belt;Rolling walker Nurse Communication: Mobility status  Activity Tolerance: Patient tolerated treatment well Patient left: in chair;with call bell/phone within reach  OT Visit Diagnosis: Unsteadiness on feet (R26.81);Muscle weakness (generalized) (M62.81);History of falling (Z91.81);Pain Pain - Right/Left: Left Pain - part of body: Leg                Time: 6333-5456 OT Time Calculation (min): 14 min Charges:  OT General Charges $OT Visit: 1 Procedure OT Evaluation $OT Eval Low Complexity: 1 Procedure G-Codes:     Devun Anna, OTR/L [phone]  [place] 09/06/2016, 12:37 PM

## 2016-09-07 ENCOUNTER — Encounter (HOSPITAL_COMMUNITY): Payer: Self-pay | Admitting: General Practice

## 2016-09-07 NOTE — Progress Notes (Signed)
Subjective: 2 Days Post-Op Procedure(s) (LRB): INTRAMEDULLARY (IM) NAIL FEMORAL (Left) Patient reports pain as mild.  More comfortable than yesterday  Objective: Vital signs in last 24 hours: Temp:  [99.4 F (37.4 C)-100.2 F (37.9 C)] 99.8 F (37.7 C) (05/28 0511) Pulse Rate:  [76-84] 79 (05/28 0511) Resp:  [18] 18 (05/28 0511) BP: (119-147)/(68-78) 147/78 (05/28 0511) SpO2:  [95 %-98 %] 95 % (05/28 0511)  Intake/Output from previous day: 05/27 0701 - 05/28 0700 In: 2106.7 [P.O.:860; I.V.:1246.7] Out: 300 [Urine:300] Intake/Output this shift: Total I/O In: 510.8 [I.V.:510.8] Out: -   No results for input(s): HGB in the last 72 hours. No results for input(s): WBC, RBC, HCT, PLT in the last 72 hours. No results for input(s): NA, K, CL, CO2, BUN, CREATININE, GLUCOSE, CALCIUM in the last 72 hours. No results for input(s): LABPT, INR in the last 72 hours.  Neurovascular intact No cellulitis present Compartment soft  Assessment/Plan: 2 Days Post-Op Procedure(s) (LRB): INTRAMEDULLARY (IM) NAIL FEMORAL (Left) Up with therapy  Plan home with home health once ready for discharge, but not ready yet  Nitika Jackowski V 09/07/2016, 6:46 AM

## 2016-09-07 NOTE — Care Management Note (Signed)
Case Management Note  Patient Details  Name: Shannon [name] MRN: [id number] Date of Birth: 16-Oct-1955  Subjective/Objective:   61 yr old female admitted with left distal femur fracture, underwent  IM Nailing of left femur.  Action/Plan: Case manager spoke with patient and her husband concerning discharge plan and DME needs. Choice for Home Health was offered, referral called to Christa See, [facility] Patient has a 3in1, CM will order a RW and wheelchair. Patient says her husband goes to dialysis 3xa week so they will get family and friends to assist her at discharge.   Expected Discharge Date:   09/08/16               Expected Discharge Plan:  [place]  In-House Referral:  NA  Discharge planning Services  CM Consult  Post Acute Care Choice:  Durable Medical Equipment, Home Health Choice offered to:  Patient, Spouse  DME Arranged:  Walker rolling, Wheelchair manual DME Agency:  [place]:  PT, OT [place] Agency:  [facility] [employer])  Status of Service:  Completed, signed off  If discussed at [employer], dates discussed:    Additional Comments:  Ninfa Meeker, RN 09/07/2016, 11:54 AM

## 2016-09-07 NOTE — Progress Notes (Signed)
Physical Therapy Treatment Patient Details Name: Shannon [name] MRN: [id number] DOB: [birth year] Today's Date: 09/07/2016    History of Present Illness 61 yo f s/p left femur fx. Underwent IM nail on 09/05/16. PMH significant for HTN, L4-5 laminectomy.     PT Comments    Session today focused on WC mobility and transfers. Pt safest performing squat pivot transfers.     Follow Up Recommendations  Home health PT;Supervision for mobility/OOB     Equipment Recommendations  Rolling walker with 5" wheels;Wheelchair (measurements PT) (18x16 lightweight WC)    Recommendations for Other Services       Precautions / Restrictions Precautions Precautions: Fall Restrictions Weight Bearing Restrictions: Yes LLE Weight Bearing: Touchdown weight bearing    Mobility  Bed Mobility Overal bed mobility: Needs Assistance Bed Mobility: Supine to Sit;Sit to Supine     Supine to sit: Supervision Sit to supine: Supervision   General bed mobility comments: Cues on safest and easiest technique  Transfers Overall transfer level: Needs assistance   Transfers: Squat Pivot Transfers;Stand Pivot Transfers Sit to Stand: Min guard Stand pivot transfers: Min assist Squat pivot transfers: Min guard     General transfer comment: Educational cues for hand placement and technique.  Squat pivot was towards right side oonto BSC  Ambulation/Gait Ambulation/Gait assistance:  (NT today. Focused on WC mobility and transfers)               Stairs            Wheelchair Mobility   Instructed pt in [facility] locks, management of footrests and armrests, positioning of chair as close as possible to where she is transferring, and general safety with chair. Pt manuvered chair about 75 feet including turns and positioning to where she is transferring.  Instructional cues required.       Modified Rankin (Stroke Patients Only)       Balance                                             Cognition Arousal/Alertness: Awake/alert Behavior During Therapy: WFL for tasks assessed/performed Overall Cognitive Status: Within Functional Limits for tasks assessed                                        Exercises      General Comments General comments (skin integrity, edema, etc.): Pt's boyfriend present and with pt's permission information related to mobility shared with him.       Pertinent Vitals/Pain Pain Assessment: 0-10 Pain Score: 4  Pain Location: left leg Pain Descriptors / Indicators: Grimacing Pain Intervention(s): Limited activity within patient's tolerance;Monitored during session    Home Living                      Prior Function            PT Goals (current goals can now be found in the care plan section) Progress towards PT goals: Progressing toward goals    Frequency    Min 5X/week      PT Plan Current plan remains appropriate    Co-evaluation              AM-PAC PT "6 Clicks" Daily Activity  Outcome Measure  Difficulty turning over in  bed (including adjusting bedclothes, sheets and blankets)?: A Little Difficulty moving from lying on back to sitting on the side of the bed? : A Little Difficulty sitting down on and standing up from a chair with arms (e.g., wheelchair, bedside commode, etc,.)?: Total Help needed moving to and from a bed to chair (including a wheelchair)?: A Little Help needed walking in hospital room?: A Lot Help needed climbing 3-5 steps with a railing? : Total 6 Click Score: 13    End of Session Equipment Utilized During Treatment: Gait belt Activity Tolerance: Patient tolerated treatment well Patient left: in bed;with call bell/phone within reach;with bed alarm set;with family/visitor present   PT Visit Diagnosis: Unsteadiness on feet (R26.81)     Time: 3888-2800 PT Time Calculation (min) (ACUTE ONLY): 33 min  Charges:  $Gait Training: 8-22 mins $Wheel Chair Management: 8-22  mins                    G CodesLavonia [name], [place]  [phone] 09/07/2016    Shannon [name] 09/07/2016, 12:01 PM

## 2016-09-08 ENCOUNTER — Encounter (HOSPITAL_COMMUNITY): Payer: Self-pay | Admitting: Orthopedic Surgery

## 2016-09-08 ENCOUNTER — Telehealth: Payer: Self-pay | Admitting: Family Medicine

## 2016-09-08 DIAGNOSIS — S72402A Unspecified fracture of lower end of left femur, initial encounter for closed fracture: Secondary | ICD-10-CM

## 2016-09-08 NOTE — Progress Notes (Addendum)
Subjective: 3 Days Post-Op Procedure(s) (LRB): INTRAMEDULLARY (IM) NAIL FEMORAL (Left) Patient reports pain as mild.  More comfortable than yesterday, but still not feeling confident when OOB.  Objective: Vital signs in last 24 hours: Temp:  [98.7 F (37.1 C)-99.8 F (37.7 C)] 99.3 F (37.4 C) (05/29 0612) Pulse Rate:  [65-80] 65 (05/29 0612) Resp:  [17] 17 (05/28 1403) BP: (116-138)/(64-76) 138/76 (05/29 0612) SpO2:  [98 %-99 %] 98 % (05/29 0612)  Intake/Output from previous day: 05/28 0701 - 05/29 0700 In: 240 [P.O.:240] Out: 400 [Urine:400] Intake/Output this shift: Total I/O In: 240 [P.O.:240] Out: -   No results for input(s): HGB in the last 72 hours. No results for input(s): WBC, RBC, HCT, PLT in the last 72 hours. No results for input(s): NA, K, CL, CO2, BUN, CREATININE, GLUCOSE, CALCIUM in the last 72 hours. No results for input(s): LABPT, INR in the last 72 hours.  Neurovascular intact No cellulitis present Compartment soft  +knee effusion Dressings c/d/i  Assessment/Plan: 3 Days Post-Op Procedure(s) (LRB): INTRAMEDULLARY (IM) NAIL FEMORAL (Left) Up with therapy  Plan home with home health once ready for discharge lovenox and SCDs for dvt ppx Likely home tomorrow am  Nicholes Stairs 09/08/2016, 1:26 PM

## 2016-09-08 NOTE — Telephone Encounter (Signed)
Caller calling to get orders for broken L distal femur. Needs OK to visit. Please f/u.

## 2016-09-08 NOTE — Care Management Important Message (Signed)
Important Message  Patient Details  Name: Shannon [name] MRN: [id number] Date of Birth: 1955-10-12   Medicare Important Message Given:  Yes    Giordan Fordham 09/08/2016, 1:57 PM

## 2016-09-08 NOTE — Progress Notes (Signed)
Occupational Therapy Treatment Patient Details Name: Shannon [name] MRN: [id number] DOB: Feb 17, 1956 Today's Date: 09/08/2016    History of present illness 61 yo f s/p left femur fx. Underwent IM nail on 09/05/16. PMH significant for HTN, L4-5 laminectomy.    OT comments  Pt progressing towards goals. Provided education on LB dressing and tub transfers. Pt perform LB dressing with Min A for safety in standing. Discussed tub transfer with 3N1 as well as tub bench; provided pt with information on purchasing bench. Continue to recommend dc home with HHOT to increase independence with ADLs and functional mobility. Will continue to follow acutely as admitted to facilitate safe dc.   Follow Up Recommendations  Home health OT;Supervision/Assistance - 24 hour (Pt desires to go home rather than to SNF)    Equipment Recommendations  Tub/shower bench    Recommendations for Other Services      Precautions / Restrictions Precautions Precautions: Fall Restrictions Weight Bearing Restrictions: Yes LLE Weight Bearing: Touchdown weight bearing       Mobility Bed Mobility Overal bed mobility: Needs Assistance Bed Mobility: Supine to Sit;Sit to Supine     Supine to sit: Supervision Sit to supine: Supervision   General bed mobility comments: Cues on safest and easiest technique  Transfers Overall transfer level: Needs assistance Equipment used: Rolling walker (2 wheeled) Transfers: Sit to/from Stand Sit to Stand: Min guard         General transfer comment: Educational cues for hand placement and technique.     Balance Overall balance assessment: Needs assistance Sitting-balance support: Feet supported;No upper extremity supported Sitting balance-Leahy Scale: Fair     Standing balance support: During functional activity;Single extremity supported Standing balance-Leahy Scale: Fair Standing balance comment: Able to maintain standing balance with single UE support while  performing toilet hygiene                           ADL either performed or assessed with clinical judgement   ADL Overall ADL's : Needs assistance/impaired     Grooming: Min guard;Wash/dry hands;Standing               Lower Body Dressing: Sit to/from stand;Minimal assistance Lower Body Dressing Details (indicate cue type and reason): Min for safety in standing. Pt donned/doffed pants with VCs for safe technique Toilet Transfer: BSC;RW;Ambulation;Min guard (BSC over toilet)   Toileting- Clothing Manipulation and Hygiene: Minimal assistance;Sit to/from stand Toileting - Clothing Manipulation Details (indicate cue type and reason): Min A for standing balance. Pt performed toilet hygiene while managing gown     Functional mobility during ADLs: Minimal assistance;+2 for safety/equipment;Rolling walker General ADL Comments: Pt demonstrated good adherance to WB precaution in LLE. Provided pt with education on LB dressing and tub transfer using 3N1. provided pt with information on tub transfer bench and educated her on safe transfer technqiues.      Vision       Perception     Praxis      Cognition Arousal/Alertness: Awake/alert Behavior During Therapy: WFL for tasks assessed/performed Overall Cognitive Status: Within Functional Limits for tasks assessed                                          Exercises     Shoulder Instructions       General Comments      Pertinent Vitals/  Pain       Pain Assessment: 0-10 Pain Score: 8  Pain Location: left leg Pain Descriptors / Indicators: Grimacing;Burning (Burning at bottom of L foot) Pain Intervention(s): Monitored during session;Repositioned  Home Living                                          Prior Functioning/Environment              Frequency  Min 2X/week        Progress Toward Goals  OT Goals(current goals can now be found in the care plan section)   Progress towards OT goals: Progressing toward goals  Acute Rehab OT Goals Patient Stated Goal: Go home OT Goal Formulation: With patient Time For Goal Achievement: 09/20/16 Potential to Achieve Goals: Good ADL Goals Pt Will Perform Lower Body Bathing: with set-up;with supervision;sit to/from stand;with adaptive equipment Pt Will Perform Lower Body Dressing: with set-up;with supervision;with adaptive equipment;sit to/from stand Pt Will Transfer to Toilet: ambulating;bedside commode;with min guard assist Pt Will Perform Toileting - Clothing Manipulation and hygiene: with min guard assist;sit to/from stand Pt Will Perform Tub/Shower Transfer: Tub transfer;tub bench;with min guard assist;rolling walker;ambulating  Plan Discharge plan remains appropriate    Co-evaluation                 AM-PAC PT "6 Clicks" Daily Activity     Outcome Measure   Help from another person eating meals?: None Help from another person taking care of personal grooming?: A Little Help from another person toileting, which includes using toliet, bedpan, or urinal?: A Little Help from another person bathing (including washing, rinsing, drying)?: A Lot Help from another person to put on and taking off regular upper body clothing?: None Help from another person to put on and taking off regular lower body clothing?: A Little 6 Click Score: 19    End of Session Equipment Utilized During Treatment: Gait belt;Rolling walker  OT Visit Diagnosis: Unsteadiness on feet (R26.81);Muscle weakness (generalized) (M62.81);History of falling (Z91.81);Pain Pain - Right/Left: Left Pain - part of body: Leg   Activity Tolerance Patient tolerated treatment well   Patient Left in chair;with call bell/phone within reach   Nurse Communication Mobility status        Time: 1497-0263 OT Time Calculation (min): 21 min  Charges: OT General Charges $OT Visit: 1 Procedure OT Treatments $Self Care/Home Management : 8-22  mins  [place], OTR/L [place] 09/08/2016, 1:23 PM

## 2016-09-08 NOTE — Progress Notes (Signed)
Physical Therapy Treatment Patient Details Name: Shannon [name] MRN: [id number] DOB: 1955/06/06 Today's Date: 09/08/2016    History of Present Illness 61 yo f s/p left femur fx. Underwent IM nail on 09/05/16. PMH significant for HTN, L4-5 laminectomy.     PT Comments    Pt able to increased ambulation tolerance and maintain L TTWB. Focused on positioning and completing transfer in/out of w/c from bed and how to manage the parts of the w/c. Pt is going home alone and will need to be seen again prior to d/c to go over w/c management again.    Follow Up Recommendations  Home health PT;Supervision for mobility/OOB     Equipment Recommendations  Rolling walker with 5" wheels;Wheelchair (measurements PT)    Recommendations for Other Services       Precautions / Restrictions Precautions Precautions: Fall Restrictions Weight Bearing Restrictions: Yes LLE Weight Bearing: Touchdown weight bearing    Mobility  Bed Mobility Overal bed mobility: Needs Assistance Bed Mobility: Supine to Sit     Supine to sit: Supervision Sit to supine: Supervision   General bed mobility comments: pt able to use hands to assist L LE off EOB and back onto bed  Transfers Overall transfer level: Needs assistance Equipment used: Rolling walker (2 wheeled) Transfers: Sit to/from Stand Sit to Stand: Min guard Stand pivot transfers: Min guard       General transfer comment: v/c's to push up from bed  Ambulation/Gait Ambulation/Gait assistance: Min guard Ambulation Distance (Feet): 20 Feet Assistive device: Rolling walker (2 wheeled) Gait Pattern/deviations: Step-to pattern Gait velocity: dec Gait velocity interpretation: Below normal speed for age/gender General Gait Details: pt able to maintain L LE TTWB but fatigues quickly   Hotel manager mobility: Yes Wheelchair propulsion: Both upper extremities Wheelchair parts: Needs  assistance Distance: 20 Wheelchair Assistance Details (indicate cue type and reason): v/c's for how to take leg rest on/off, bring arm rest up for stand pvt, how to position w/c for optimal std pvt transfer to/from bed. to transfer towards R side  Modified Rankin (Stroke Patients Only)       Balance Overall balance assessment: Needs assistance Sitting-balance support: Feet supported;No upper extremity supported Sitting balance-Leahy Scale: Fair     Standing balance support: During functional activity;Single extremity supported Standing balance-Leahy Scale: Fair Standing balance comment: able to maintain standing balance with unilateral UE support                            Cognition Arousal/Alertness: Awake/alert Behavior During Therapy: WFL for tasks assessed/performed Overall Cognitive Status: Within Functional Limits for tasks assessed                                        Exercises      General Comments General comments (skin integrity, edema, etc.): dressing intact      Pertinent Vitals/Pain Pain Assessment: 0-10 Pain Score: 7  Pain Location: L leg Pain Descriptors / Indicators: Aching Pain Intervention(s): Monitored during session    Home Living                      Prior Function            PT Goals (current goals can now be found in  the care plan section) Acute Rehab PT Goals Patient Stated Goal: go home Progress towards PT goals: Progressing toward goals    Frequency    Min 5X/week      PT Plan Current plan remains appropriate    Co-evaluation              AM-PAC PT "6 Clicks" Daily Activity  Outcome Measure  Difficulty turning over in bed (including adjusting bedclothes, sheets and blankets)?: A Little Difficulty moving from lying on back to sitting on the side of the bed? : A Little Difficulty sitting down on and standing up from a chair with arms (e.g., wheelchair, bedside commode, etc,.)?: A  Little Help needed moving to and from a bed to chair (including a wheelchair)?: A Little Help needed walking in hospital room?: A Little Help needed climbing 3-5 steps with a railing? : Total 6 Click Score: 16    End of Session Equipment Utilized During Treatment: Gait belt Activity Tolerance: Patient tolerated treatment well Patient left: in bed;with call bell/phone within reach;with bed alarm set;with family/visitor present Nurse Communication: Mobility status PT Visit Diagnosis: Unsteadiness on feet (R26.81)     Time: 1434-1500 PT Time Calculation (min) (ACUTE ONLY): 26 min  Charges:  $Gait Training: 8-22 mins $Therapeutic Activity: 8-22 mins                    G Codes:       Kittie Plater, PT, DPT Pager #: [phone] Office #: [phone]    [place] 09/08/2016, 3:03 PM

## 2016-09-09 NOTE — Progress Notes (Signed)
Discharge instructions and prescriptions reviewed with patient. Patient stated understanding. IV removed. Cab is here for patient to take patient home

## 2016-09-09 NOTE — Social Work (Signed)
CSW provided and set up cab voucher for patient to Dc home. Nurse confirmed that family would meet patient at home.  Clinical Social Worker will sign off for now as social work intervention is no longer needed. Please consult [place] again if new need arises.  Elissa Hefty, LCSW Clinical Social Worker [phone]

## 2016-09-09 NOTE — Evaluation (Signed)
Physical Therapy Evaluation Patient Details Name: Shannon [name] MRN: [id number] DOB: March 30, 1956 Today's Date: 09/09/2016   History of Present Illness  61 yo f s/p left femur fx. Underwent IM nail on 09/05/16. PMH significant for HTN, L4-5 laminectomy.   Clinical Impression  Pt progressing towards goals. Treatment focused on [facility] management this session. Pt able to recall how to appropriately set WC up to transfer, however, required cues for management of leg rests and arm rests. Practiced managing parts of WC and transfers to [facility] and back to bed. Pt required min guard to supervision for transfers and WC mobility. Pt reports her boyfriend and son will be able to assist at home and will be getting an aide. Will continue to follow acutely.     Follow Up Recommendations Home health PT;Supervision for mobility/OOB    Equipment Recommendations  Rolling walker with 5" wheels;Wheelchair (measurements PT)    Recommendations for Other Services       Precautions / Restrictions Precautions Precautions: Fall Restrictions Weight Bearing Restrictions: Yes LLE Weight Bearing: Touchdown weight bearing      Mobility  Bed Mobility Overal bed mobility: Needs Assistance Bed Mobility: Supine to Sit;Sit to Supine     Supine to sit: Supervision Sit to supine: Supervision   General bed mobility comments: Pt used BUE for LLE management during bed mobility. Supervision for safety.   Transfers Overall transfer level: Needs assistance Equipment used: Rolling walker (2 wheeled) Transfers: Sit to/from [facility] Sit to Stand: Min guard;Supervision Stand pivot transfers: Min guard       General transfer comment: Min guard for safety. Able to perform second sit>stand with supervision. Demonstrated good hand placement.  Performed stand pivot trasnfer X2 from bed to WC and WC back to bed.   Ambulation/Gait             General Gait Details: Focused on WC mobility this session  for safe return home   Hotel manager mobility: Yes Wheelchair propulsion: Both upper extremities Wheelchair parts: Needs assistance Distance: 30 Wheelchair Assistance Details (indicate cue type and reason): Pt able to recall positioning of WC on the R. Initially required cues and demonstration for leg rest management, but able to recall how to manage leg rests by end of session. Pt required cues and demonstration for arm rest management. Allowed pt to practice taking off and putting on arm rests. Verbal cues for appropriate use of UE for WC mobility, especially in turns.   Modified Rankin (Stroke Patients Only)       Balance Overall balance assessment: Needs assistance Sitting-balance support: Feet supported;No upper extremity supported Sitting balance-Leahy Scale: Fair     Standing balance support: Bilateral upper extremity supported;During functional activity Standing balance-Leahy Scale: Poor Standing balance comment: Reliant on RW for stability.                              Pertinent Vitals/Pain Pain Assessment: 0-10 Pain Score: 9  Pain Location: L leg Pain Descriptors / Indicators: Aching Pain Intervention(s): Limited activity within patient's tolerance;Monitored during session;Premedicated before session    Home Living                        Prior Function                 Hand Dominance  Extremity/Trunk Assessment                Communication      Cognition Arousal/Alertness: Awake/alert Behavior During Therapy: WFL for tasks assessed/performed Overall Cognitive Status: Within Functional Limits for tasks assessed                                        General Comments General comments (skin integrity, edema, etc.): dressing in tact    Exercises     Assessment/Plan    PT Assessment    PT Problem List         PT Treatment  Interventions      PT Goals (Current goals can be found in the Care Plan section)  Acute Rehab PT Goals Patient Stated Goal: go home PT Goal Formulation: With patient Time For Goal Achievement: 09/12/16 Potential to Achieve Goals: Good    Frequency Min 5X/week   Barriers to discharge        Co-evaluation               AM-PAC PT "6 Clicks" Daily Activity  Outcome Measure Difficulty turning over in bed (including adjusting bedclothes, sheets and blankets)?: A Little Difficulty moving from lying on back to sitting on the side of the bed? : A Little Difficulty sitting down on and standing up from a chair with arms (e.g., wheelchair, bedside commode, etc,.)?: A Little Help needed moving to and from a bed to chair (including a wheelchair)?: A Little Help needed walking in hospital room?: A Little Help needed climbing 3-5 steps with a railing? : Total 6 Click Score: 16    End of Session Equipment Utilized During Treatment: Gait belt Activity Tolerance: Patient tolerated treatment well Patient left: in bed;with call bell/phone within reach Nurse Communication: Mobility status PT Visit Diagnosis: Unsteadiness on feet (R26.81)    Time: 9432-7614 PT Time Calculation (min) (ACUTE ONLY): 23 min   Charges:     PT Treatments $Therapeutic Activity: 8-22 mins $Wheel Chair Management: 8-22 mins   PT G Codes:        Nicky Pugh, PT, DPT  Acute Rehabilitation Services  Pager: [place] 09/09/2016, 12:21 PM

## 2016-09-09 NOTE — Plan of Care (Signed)
Problem: Safety: Goal: Ability to remain free from injury will improve Outcome: Progressing No incidence of falls during this admission. Call bell within reach. Bed in low and locked position. Patient alert and oriented. Clean and clear environment maintained. Nonskid footwear being utilized. 2/4 siderails in place. Patient verbalized understanding of safety instruction.  Problem: Pain Management: Goal: General experience of comfort will improve Outcome: Progressing Pain being managed with PO PRN pain medication every 4 hours. Vital signs stable.

## 2016-09-10 NOTE — Telephone Encounter (Signed)
Also left VM on [place] Requesting call back if there are needs

## 2016-09-10 NOTE — Telephone Encounter (Signed)
Called back to [facility] Patient's visits have been approved I did place a referral in the chart Patient has already been scheduled as she iis follow up from ED/hospitalization.

## 2016-09-15 NOTE — Discharge Summary (Signed)
Patient ID: Shannon [name] MRN: [id number] DOB/AGE: [birth year] 61 y.o.  Admit date: 09/03/2016 Discharge date: 09/09/2016  Primary Diagnosis: Left distal femur fracture  Admission Diagnoses:  Past Medical History:  Diagnosis Date  . Dizziness    occasionally  . Headache   . Hyperlipidemia    takes Tricor daily  . Hypertension    takes Lisinopril and Metoprolol daily  . Numbness    stinging in right hand  . Peripheral edema    takes lasix daily  . Ventral hernia    Discharge Diagnoses:   Active Problems:   Closed fracture of left distal femur (HCC)  Estimated body mass index is 31.64 kg/m as calculated from the following:   Height as of this encounter: _0  (1.575 m).   Weight as of this encounter: 78.5 kg (173 lb).  Procedure:  Procedure(s) (LRB): INTRAMEDULLARY (IM) NAIL FEMORAL (Left)   Consults: None  HPI: Shannon [name] presented to our ED on 5/24 following a ground level fall at home where she sustained a left distal femur fracture that was closed.  She was evaluated by my partner and due to scheduling he asked for my assistance with management.  She was indicated for operative fixation and this was scheduled after my evaluation and discussion of the case with the patient.  Laboratory Data: Admission on 09/03/2016, Discharged on 09/09/2016  Component Date Value Ref Range Status  . Sodium 09/03/2016 143  135 - 145 mmol/L Final  . Potassium 09/03/2016 4.1  3.5 - 5.1 mmol/L Final  . Chloride 09/03/2016 115* 101 - 111 mmol/L Final  . CO2 09/03/2016 23  22 - 32 mmol/L Final  . Glucose, Bld 09/03/2016 109* 65 - 99 mg/dL Final  . BUN 09/03/2016 18  6 - 20 mg/dL Final  . Creatinine, Ser 09/03/2016 1.12* 0.44 - 1.00 mg/dL Final  . Calcium 09/03/2016 8.9  8.9 - 10.3 mg/dL Final  . GFR calc non Af Amer 09/03/2016 53* >60 mL/min Final  . GFR calc Af Amer 09/03/2016 >60  >60 mL/min Final   Comment: (NOTE) The eGFR has been calculated using the CKD EPI  equation. This calculation has not been validated in all clinical situations. eGFR's persistently <60 mL/min signify possible Chronic Kidney Disease.   . Anion gap 09/03/2016 5  5 - 15 Final  . WBC 09/03/2016 6.5  4.0 - 10.5 K/uL Final  . RBC 09/03/2016 5.32* 3.87 - 5.11 MIL/uL Final  . Hemoglobin 09/03/2016 12.4  12.0 - 15.0 g/dL Final  . HCT 09/03/2016 39.7  36.0 - 46.0 % Final  . MCV 09/03/2016 74.6* 78.0 - 100.0 fL Final  . MCH 09/03/2016 23.3* 26.0 - 34.0 pg Final  . MCHC 09/03/2016 31.2  30.0 - 36.0 g/dL Final  . RDW 09/03/2016 15.5  11.5 - 15.5 % Final  . Platelets 09/03/2016 284  150 - 400 K/uL Final  . Neutrophils Relative % 09/03/2016 67  % Final  . Neutro Abs 09/03/2016 4.4  1.7 - 7.7 K/uL Final  . Lymphocytes Relative 09/03/2016 29  % Final  . Lymphs Abs 09/03/2016 1.9  0.7 - 4.0 K/uL Final  . Monocytes Relative 09/03/2016 3  % Final  . Monocytes Absolute 09/03/2016 0.2  0.1 - 1.0 K/uL Final  . Eosinophils Relative 09/03/2016 1  % Final  . Eosinophils Absolute 09/03/2016 0.1  0.0 - 0.7 K/uL Final  . Basophils Relative 09/03/2016 0  % Final  . Basophils Absolute 09/03/2016 0.0  0.0 - 0.1 K/uL  Final  . ABO/RH(D) 09/03/2016 B POS   Final  . Antibody Screen 09/03/2016 NEG   Final  . Sample Expiration 09/03/2016 09/06/2016   Final  . HIV Screen 4th Generation wRfx 09/03/2016 Non Reactive  Non Reactive Final   Comment: (NOTE) Performed At: [facility] [place], [place] [id number] Lindon Romp MD VQ:[id number]   . MRSA, PCR 09/04/2016 NEGATIVE  NEGATIVE Final  . Staphylococcus aureus 09/04/2016 NEGATIVE  NEGATIVE Final   Comment:        The Xpert SA Assay (FDA approved for NASAL specimens in patients over 51 years of age), is one component of a comprehensive surveillance program.  Test performance has been validated by [facility] for patients greater than or equal to 21 year old. It is not intended to diagnose infection nor to guide or  monitor treatment.      X-Rays:Ct Femur Left Wo Contrast  Result Date: 09/03/2016 CLINICAL DATA:  Femur fracture. CT to exclude articular involvement. Initial encounter. EXAM: CT OF THE LOWER LEFT EXTREMITY WITHOUT CONTRAST TECHNIQUE: Multidetector CT imaging of the lower left extremity was performed according to the standard protocol. COMPARISON:  Radiography from earlier today FINDINGS: Bones/Joint/Cartilage Predominately transverse fracture involving the distal diaphysis of the femur. There is comminution with multiple branching fractures extending distally to the level of the knee joint, extending into the intracondylar notch and the medial femoral condyle anteriorly. No displacement along the articular surface. Remote proximal tibia fracture with screws that are partly visualized. Advanced tricompartmental osteoarthritis of the knee. Large lipohemarthrosis. Ligaments Suboptimally assessed by CT. Muscles and Tendons The extensor mechanism appears to be intact.  No malalignment. Large intramuscular hematoma in the anterior compartment about the fracture, commonly seen. Soft tissues Stranding/ hemorrhage in the popliteal fossa. No bone fragments or distortion along the neurovascular bundle. No bone fragments impinging on the SFA. IMPRESSION: 1. Comminuted distal femur fracture extending from the distal diaphysis to the intercondylar notch/medial femoral condyle. No displacement along the articular surface. 2. Tricompartmental knee osteoarthritis. Prior proximal tibia fracture and repair. Electronically Signed   By: Monte Fantasia M.D.   On: 09/03/2016 17:37   Dg Chest Port 1 View  Result Date: 09/03/2016 CLINICAL DATA:  Preop EXAM: PORTABLE CHEST 1 VIEW COMPARISON:  05/22/2013 FINDINGS: Heart size upper normal. Mild vascular congestion without edema or effusion. Mild bibasilar airspace disease most likely atelectasis. IMPRESSION: Mild vascular congestion without edema Mild bibasilar atelectasis.  Electronically Signed   By: Franchot Gallo M.D.   On: 09/03/2016 14:09   Dg C-arm 1-60 Min  Result Date: 09/05/2016 CLINICAL DATA:  61 year old female with a history of femoral fracture. EXAM: DG C-ARM 61-120 MIN; LEFT FEMUR 2 VIEWS COMPARISON:  09/03/2016 FINDINGS: Intraoperative fluoroscopic spot images of orthopedic surgery. Images demonstrate retrograde intramedullary femoral rod placement for fixation of distal femoral fracture. Three distal interlocking screws with 2 proximal interlocking screws. Improved anatomic alignment at the completion images. Re- demonstration of prior surgical changes of the tibia. No complicating features. IMPRESSION: Intraoperative fluoroscopic spot images of open reduction internal fixation of left femoral fracture, as above. No immediate complicating features. Please refer to the dictated operative report for full details of intraoperative findings and procedure. Electronically Signed   By: Corrie Mckusick D.O.   On: 09/05/2016 11:29   Dg Femur Min 2 Views Left  Result Date: 09/05/2016 CLINICAL DATA:  61 year old female with a history of femoral fracture. EXAM: DG C-ARM 61-120 MIN; LEFT FEMUR 2 VIEWS COMPARISON:  09/03/2016 FINDINGS: Intraoperative fluoroscopic spot images of orthopedic surgery. Images demonstrate retrograde intramedullary femoral rod placement for fixation of distal femoral fracture. Three distal interlocking screws with 2 proximal interlocking screws. Improved anatomic alignment at the completion images. Re- demonstration of prior surgical changes of the tibia. No complicating features. IMPRESSION: Intraoperative fluoroscopic spot images of open reduction internal fixation of left femoral fracture, as above. No immediate complicating features. Please refer to the dictated operative report for full details of intraoperative findings and procedure. Electronically Signed   By: Corrie Mckusick D.O.   On: 09/05/2016 11:29   Dg Femur Min 2 Views Left  Result  Date: 09/03/2016 CLINICAL DATA:  Status post fall.  Left leg deformity. EXAM: LEFT FEMUR 2 VIEWS COMPARISON:  None. FINDINGS: Comminuted transverse fracture of the distal femoral diaphysis with a oblique fracture cleft extending from the primary fracture cleft into the left femoral diametaphysis. Apex posterior and lateral angulation. 10 mm of posterior displacement. 12 mm of lateral displacement. No other fracture or dislocation.  Large likely hemarthrosis. Partially visualize is old lateral tibial plateau fracture transfixed with 2 cannulated screws. IMPRESSION: Comminuted fracture of the distal left femoral diaphysis extending into the metaphysis with apex posterior and lateral angulation. Electronically Signed   By: Kathreen Devoid   On: 09/03/2016 13:30   Dg Femur Port Min 2 Views Left  Result Date: 09/05/2016 CLINICAL DATA:  Left distal femur fracture. EXAM: LEFT FEMUR PORTABLE 2 VIEWS COMPARISON:  Radiographs Sep 03, 2016. FINDINGS: The patient is status post intramedullary rod fixation of comminuted distal left femoral shaft fracture. There is significantly improved alignment of fracture components compared to prior exam. IMPRESSION: Status post intramedullary rod fixation of comminuted distal left femoral shaft fracture. Electronically Signed   By: Marijo Conception, M.D.   On: 09/05/2016 12:06    EKG: Orders placed or performed during the hospital encounter of 09/03/16  . ED EKG  . ED EKG  . EKG 12-Lead  . EKG 12-Lead  . EKG     Hospital Course: Shannon [name] is a 61 y.o. who was admitted to Hospital. They were brought to the operating room on 09/05/2016 and underwent Procedure(s):retrograde INTRAMEDULLARY (IM) NAIL FEMORAL.  Patient tolerated the procedure well and was later transferred to the recovery room and then to the orthopaedic floor for postoperative care.  They were given PO and IV analgesics for pain control following their surgery.  They were given 24 hours of postoperative  antibiotics of  Anti-infectives    Start     Dose/Rate Route Frequency Ordered Stop   09/05/16 1600  ceFAZolin (ANCEF) IVPB 2g/100 mL premix     2 g 200 mL/hr over 30 Minutes Intravenous Every 6 hours 09/05/16 1310 09/06/16 0556     and started on DVT prophylaxis in the form of Lovenox and SCDS.   PT and OT were ordered.  Discharge planning consulted to help with postop disposition and equipment needs.  Patient had a good night on the evening of surgery.  They started to get up OOB with therapy on day one.  Continued to work with therapy into day two. Post op dressings remained c/d/i.  By day three, the patient had progressed with therapy and meeting their goals. But continued with some breakthrough pain and stayed another night.On POD #4 the Patient was seen in rounds and was ready to go home.   Diet: Regular diet Activity:NWB Follow-up:in 2 weeks Disposition - with HHRN Discharged Condition: good  Discharge Instructions    Call MD / Call 911    Complete by:  As directed    If you experience chest pain or shortness of breath, CALL 911 and be transported to the hospital emergency room.  If you develope a fever above 101 F, pus (white drainage) or increased drainage or redness at the wound, or calf pain, call your surgeon's office.   Constipation Prevention    Complete by:  As directed    Drink plenty of fluids.  Prune juice may be helpful.  You may use a stool softener, such as Colace (over the counter) 100 mg twice a day.  Use MiraLax (over the counter) for constipation as needed.   Diet - low sodium heart healthy    Complete by:  As directed    Increase activity slowly as tolerated    Complete by:  As directed      Allergies as of 09/09/2016      Reactions   Morphine And Related    Itch      Medication List    TAKE these medications   acetaminophen-codeine 300-30 MG tablet Commonly known as:  TYLENOL #3 Take 1 tablet by mouth 2 (two) times daily.   amoxicillin 875 MG  tablet Commonly known as:  AMOXIL Take 1 tablet (875 mg total) by mouth 2 (two) times daily.   cyclobenzaprine 10 MG tablet Commonly known as:  FLEXERIL Take 1 tablet (10 mg total) by mouth at bedtime.   enoxaparin 40 MG/0.4ML injection Commonly known as:  LOVENOX Inject 0.4 mLs (40 mg total) into the skin daily.   fenofibrate 48 MG tablet Commonly known as:  TRICOR Take 1 tablet (48 mg total) by mouth daily.   hydrALAZINE 50 MG tablet Commonly known as:  APRESOLINE Take 1 tablet (50 mg total) by mouth 3 (three) times daily.   lisinopril 40 MG tablet Commonly known as:  PRINIVIL,ZESTRIL Take 1 tablet (40 mg total) by mouth daily.   meloxicam 15 MG tablet Commonly known as:  MOBIC Take 1 tablet (15 mg total) by mouth daily.   metoprolol succinate 100 MG 24 hr tablet Commonly known as:  TOPROL-XL Take 1 tablet (100 mg total) by mouth daily. Take with or immediately following a meal.   ondansetron 4 MG disintegrating tablet Commonly known as:  ZOFRAN ODT Take 1 tablet (4 mg total) by mouth every 8 (eight) hours as needed.   oxyCODONE 5 MG immediate release tablet Commonly known as:  ROXICODONE Take 1-2 tablets (5-10 mg total) by mouth every 4 (four) hours as needed.      Follow-up Information    Nicholes Stairs, MD. Schedule an appointment as soon as possible for a visit in 2 week(s).   Specialty:  Orthopedic Surgery Contact information: [address] [place] [address] [phone]        [facility] Follow up.   Specialty:  [facility] Contact information: [place] [address] [phone]           Signed: Geralynn Rile, MD Orthopaedic Surgery 09/15/2016, 9:22 PM

## 2016-09-21 DIAGNOSIS — Z4789 Encounter for other orthopedic aftercare: Secondary | ICD-10-CM | POA: Diagnosis not present

## 2016-09-21 DIAGNOSIS — S72352D Displaced comminuted fracture of shaft of left femur, subsequent encounter for closed fracture with routine healing: Secondary | ICD-10-CM | POA: Diagnosis not present

## 2016-09-27 ENCOUNTER — Other Ambulatory Visit: Payer: Self-pay | Admitting: Family Medicine

## 2016-09-27 DIAGNOSIS — M1712 Unilateral primary osteoarthritis, left knee: Secondary | ICD-10-CM

## 2016-10-02 DIAGNOSIS — I10 Essential (primary) hypertension: Secondary | ICD-10-CM | POA: Diagnosis not present

## 2016-10-02 DIAGNOSIS — S72352D Displaced comminuted fracture of shaft of left femur, subsequent encounter for closed fracture with routine healing: Secondary | ICD-10-CM | POA: Diagnosis not present

## 2016-10-02 DIAGNOSIS — Z9181 History of falling: Secondary | ICD-10-CM | POA: Diagnosis not present

## 2016-10-02 DIAGNOSIS — W19XXXD Unspecified fall, subsequent encounter: Secondary | ICD-10-CM | POA: Diagnosis not present

## 2016-10-02 DIAGNOSIS — F17200 Nicotine dependence, unspecified, uncomplicated: Secondary | ICD-10-CM | POA: Diagnosis not present

## 2016-10-06 DIAGNOSIS — S72352D Displaced comminuted fracture of shaft of left femur, subsequent encounter for closed fracture with routine healing: Secondary | ICD-10-CM | POA: Diagnosis not present

## 2016-10-06 DIAGNOSIS — I10 Essential (primary) hypertension: Secondary | ICD-10-CM | POA: Diagnosis not present

## 2016-10-06 DIAGNOSIS — F17200 Nicotine dependence, unspecified, uncomplicated: Secondary | ICD-10-CM | POA: Diagnosis not present

## 2016-10-06 DIAGNOSIS — W19XXXD Unspecified fall, subsequent encounter: Secondary | ICD-10-CM | POA: Diagnosis not present

## 2016-10-06 DIAGNOSIS — Z9181 History of falling: Secondary | ICD-10-CM | POA: Diagnosis not present

## 2016-10-08 DIAGNOSIS — W19XXXD Unspecified fall, subsequent encounter: Secondary | ICD-10-CM | POA: Diagnosis not present

## 2016-10-08 DIAGNOSIS — F17200 Nicotine dependence, unspecified, uncomplicated: Secondary | ICD-10-CM | POA: Diagnosis not present

## 2016-10-08 DIAGNOSIS — Z9181 History of falling: Secondary | ICD-10-CM | POA: Diagnosis not present

## 2016-10-08 DIAGNOSIS — S72352D Displaced comminuted fracture of shaft of left femur, subsequent encounter for closed fracture with routine healing: Secondary | ICD-10-CM | POA: Diagnosis not present

## 2016-10-08 DIAGNOSIS — I10 Essential (primary) hypertension: Secondary | ICD-10-CM | POA: Diagnosis not present

## 2016-10-12 DIAGNOSIS — W19XXXD Unspecified fall, subsequent encounter: Secondary | ICD-10-CM | POA: Diagnosis not present

## 2016-10-12 DIAGNOSIS — S72352D Displaced comminuted fracture of shaft of left femur, subsequent encounter for closed fracture with routine healing: Secondary | ICD-10-CM | POA: Diagnosis not present

## 2016-10-12 DIAGNOSIS — F17200 Nicotine dependence, unspecified, uncomplicated: Secondary | ICD-10-CM | POA: Diagnosis not present

## 2016-10-12 DIAGNOSIS — I10 Essential (primary) hypertension: Secondary | ICD-10-CM | POA: Diagnosis not present

## 2016-10-12 DIAGNOSIS — Z9181 History of falling: Secondary | ICD-10-CM | POA: Diagnosis not present

## 2016-10-15 ENCOUNTER — Encounter: Payer: Self-pay | Admitting: Family Medicine

## 2016-10-15 ENCOUNTER — Ambulatory Visit: Payer: Medicare Other | Attending: Family Medicine | Admitting: Family Medicine

## 2016-10-15 VITALS — BP 140/90 | HR 60 | Temp 97.9°F | Wt 163.4 lb

## 2016-10-15 DIAGNOSIS — S72402D Unspecified fracture of lower end of left femur, subsequent encounter for closed fracture with routine healing: Secondary | ICD-10-CM | POA: Insufficient documentation

## 2016-10-15 DIAGNOSIS — Z87891 Personal history of nicotine dependence: Secondary | ICD-10-CM | POA: Diagnosis not present

## 2016-10-15 DIAGNOSIS — M545 Low back pain: Secondary | ICD-10-CM

## 2016-10-15 DIAGNOSIS — I10 Essential (primary) hypertension: Secondary | ICD-10-CM | POA: Insufficient documentation

## 2016-10-15 DIAGNOSIS — X58XXXD Exposure to other specified factors, subsequent encounter: Secondary | ICD-10-CM | POA: Insufficient documentation

## 2016-10-15 DIAGNOSIS — Z79899 Other long term (current) drug therapy: Secondary | ICD-10-CM | POA: Diagnosis not present

## 2016-10-15 DIAGNOSIS — S72402A Unspecified fracture of lower end of left femur, initial encounter for closed fracture: Secondary | ICD-10-CM

## 2016-10-15 DIAGNOSIS — G8929 Other chronic pain: Secondary | ICD-10-CM | POA: Insufficient documentation

## 2016-10-15 DIAGNOSIS — E785 Hyperlipidemia, unspecified: Secondary | ICD-10-CM

## 2016-10-15 MED ORDER — MELOXICAM 15 MG PO TABS: 15.0000 mg | ORAL_TABLET | Freq: Every day | ORAL | 1 refills | Status: DC

## 2016-10-15 MED ORDER — LISINOPRIL 40 MG PO TABS: 40.0000 mg | ORAL_TABLET | Freq: Every day | ORAL | 3 refills | Status: DC

## 2016-10-15 MED ORDER — ASPIRIN 81 MG PO TABS: 81.0000 mg | ORAL_TABLET | Freq: Every day | ORAL | 3 refills | Status: DC

## 2016-10-15 MED ORDER — METOPROLOL SUCCINATE ER 100 MG PO TB24: 100.0000 mg | ORAL_TABLET | Freq: Every day | ORAL | 3 refills | Status: DC

## 2016-10-15 MED ORDER — FENOFIBRATE 48 MG PO TABS: 48.0000 mg | ORAL_TABLET | Freq: Every day | ORAL | 3 refills | Status: DC

## 2016-10-15 MED ORDER — HYDRALAZINE HCL 50 MG PO TABS: 50.0000 mg | ORAL_TABLET | Freq: Three times a day (TID) | ORAL | 3 refills | Status: DC

## 2016-10-15 NOTE — Assessment & Plan Note (Signed)
Followed closely by ortho

## 2016-10-15 NOTE — Assessment & Plan Note (Signed)
Has history of chronic pain Not complaining today Main pain is L lateral distal femur, tramadol is controlling pain better than tylenol #3 did Patient is currently receiving tramadol Rx from ortho  Plan to continue tramadol if chronic pain Patient will need updated UDS and pain management contract

## 2016-10-15 NOTE — Patient Instructions (Addendum)
Shannon [name] was seen today for hypertension.  Diagnoses and all orders for this visit:  Chronic bilateral low back pain without sciatica -     Cancel: Drug Screen 8 w/Conf, Ur -     meloxicam (MOBIC) 15 MG tablet; Take 1 tablet (15 mg total) by mouth daily.  Essential hypertension -     hydrALAZINE (APRESOLINE) 50 MG tablet; Take 1 tablet (50 mg total) by mouth 3 (three) times daily. -     lisinopril (PRINIVIL,ZESTRIL) 40 MG tablet; Take 1 tablet (40 mg total) by mouth daily. -     metoprolol succinate (TOPROL-XL) 100 MG 24 hr tablet; Take 1 tablet (100 mg total) by mouth daily. Take with or immediately following a meal. -     aspirin 81 MG tablet; Take 1 tablet (81 mg total) by mouth daily.  Closed fracture of distal end of left femur, unspecified fracture morphology, initial encounter (HCC) -     meloxicam (MOBIC) 15 MG tablet; Take 1 tablet (15 mg total) by mouth daily.  Dyslipidemia -     fenofibrate (TRICOR) 48 MG tablet; Take 1 tablet (48 mg total) by mouth daily. -     Lipid Panel  start baby aspirin to reduce heart disease risk   F/u in 3 months for HTN, sooner if needed  Dr. Adrian Blackwater

## 2016-10-15 NOTE — Progress Notes (Signed)
Patient ID: Shannon [name], female   DOB: [birth year], 61 y.o.   MRN: [id number]   Subjective:  Patient ID: Shannon [name], female    DOB: 05/16/1955  Age: 61 y.o. MRN: [id number]  CC: Hypertension   HPI Shannon [name] presents for   1. CHRONIC HYPERTENSION  Disease Monitoring  Blood pressure range: not checking   Chest pain: no   Dyspnea: no   Claudication: no   Medication compliance: yes, but none today.   Medication Side Effects  Lightheadedness: no   Urinary frequency: no   Edema: Left leg. Following fracture.   Impotence: no   Preventitive Healthcare:  Exercise: no   Diet Pattern:   Salt Restriction: yes  2. Left distal femur fracture: she is 6 weeks s/p L femur fracture and repair. She is followed closely by orthopaedics, Dr. Francee Piccolo. Her pain was initially controlled with oxycodone. She has been transitioned to tramadol as of 10/01/2016. She reports tramadol works better than the tylenol #3 she used to take for chronic back pain. She stills takes meloxicam. She reports the majority of her pain is his her knee, especially lateral. She is up in the walker. She is getting some home PT.   Past Surgical History:  Procedure Laterality Date  . ABDOMINAL HYSTERECTOMY    . BACK SURGERY  09/01/2010   L4-5 lumbar decompression including laminotomy, facetectomy,   . colonscopy    . FEMUR IM NAIL Left 09/05/2016  . FEMUR IM NAIL Left 09/05/2016   Procedure: INTRAMEDULLARY (IM) NAIL FEMORAL;  Surgeon: Nicholes Stairs, MD;  Location: [facility];  Service: Orthopedics;  Laterality: Left;  . FRACTURE SURGERY     left leg  . INSERTION OF MESH N/A 05/24/2013   Procedure: INSERTION OF MESH;  Surgeon: Imogene Burn. Georgette Dover, MD;  Location: [facility];  Service: General;  Laterality: N/A;  . UTERINE FIBROID SURGERY    . VENTRAL HERNIA REPAIR N/A 05/24/2013   Procedure: OPEN REPAIR EPIGASTRIC VENTRAL HERNIA;  Surgeon: Imogene Burn. Georgette Dover, MD;  Location: [facility];  Service: General;  Laterality:  N/A;    Social History  Substance Use Topics  . Smoking status: Former Research scientist (life sciences)  . Smokeless tobacco: Never Used  . Alcohol use 1.2 oz/week    2 Cans of beer per week    Past Surgical History:  Procedure Laterality Date  . ABDOMINAL HYSTERECTOMY    . BACK SURGERY  09/01/2010   L4-5 lumbar decompression including laminotomy, facetectomy,   . colonscopy    . FEMUR IM NAIL Left 09/05/2016  . FEMUR IM NAIL Left 09/05/2016   Procedure: INTRAMEDULLARY (IM) NAIL FEMORAL;  Surgeon: Nicholes Stairs, MD;  Location: [facility];  Service: Orthopedics;  Laterality: Left;  . FRACTURE SURGERY     left leg  . INSERTION OF MESH N/A 05/24/2013   Procedure: INSERTION OF MESH;  Surgeon: Imogene Burn. Georgette Dover, MD;  Location: [facility];  Service: General;  Laterality: N/A;  . UTERINE FIBROID SURGERY    . VENTRAL HERNIA REPAIR N/A 05/24/2013   Procedure: OPEN REPAIR EPIGASTRIC VENTRAL HERNIA;  Surgeon: Imogene Burn. Georgette Dover, MD;  Location: [facility];  Service: General;  Laterality: N/A;   Outpatient Medications Prior to Visit  Medication Sig Dispense Refill  . acetaminophen-codeine (TYLENOL #3) 300-30 MG tablet Take 1 tablet by mouth 2 (two) times daily. 60 tablet 2  . amoxicillin (AMOXIL) 875 MG tablet Take 1 tablet (875 mg total) by mouth 2 (two) times daily. (Patient not taking: Reported  on 09/03/2016) 20 tablet 0  . cyclobenzaprine (FLEXERIL) 10 MG tablet Take 1 tablet (10 mg total) by mouth at bedtime. (Patient not taking: Reported on 09/03/2016) 90 tablet 3  . enoxaparin (LOVENOX) 40 MG/0.4ML injection Inject 0.4 mLs (40 mg total) into the skin daily. 11.2 mL 0  . fenofibrate (TRICOR) 48 MG tablet Take 1 tablet (48 mg total) by mouth daily. 90 tablet 3  . hydrALAZINE (APRESOLINE) 50 MG tablet Take 1 tablet (50 mg total) by mouth 3 (three) times daily. 90 tablet 5  . lisinopril (PRINIVIL,ZESTRIL) 40 MG tablet Take 1 tablet (40 mg total) by mouth daily. 90 tablet 3  . meloxicam (MOBIC) 15 MG tablet TAKE 1 TABLET(15 MG) BY  MOUTH DAILY 30 tablet 0  . metoprolol succinate (TOPROL-XL) 100 MG 24 hr tablet Take 1 tablet (100 mg total) by mouth daily. Take with or immediately following a meal. 90 tablet 3  . ondansetron (ZOFRAN ODT) 4 MG disintegrating tablet Take 1 tablet (4 mg total) by mouth every 8 (eight) hours as needed. 20 tablet 0  . oxyCODONE (ROXICODONE) 5 MG immediate release tablet Take 1-2 tablets (5-10 mg total) by mouth every 4 (four) hours as needed. 50 tablet 0   No facility-administered medications prior to visit.     ROS Review of Systems  Constitutional: Negative for chills and fever.  HENT: Negative for congestion.   Eyes: Negative for visual disturbance.  Respiratory: Negative for cough and shortness of breath.   Cardiovascular: Negative for chest pain.  Gastrointestinal: Negative for abdominal pain and blood in stool.  Musculoskeletal: Positive for arthralgias, back pain and joint swelling.  Skin: Negative for rash.  Allergic/Immunologic: Negative for immunocompromised state.  Neurological: Negative for headaches.  Hematological: Negative for adenopathy. Does not bruise/bleed easily.  Psychiatric/Behavioral: Negative for dysphoric mood and suicidal ideas.    Objective:  BP (!) 142/84   Pulse 60   Temp 97.9 F (36.6 C) (Oral)   Wt 163 lb 6.4 oz (74.1 kg)   SpO2 96%   BMI 29.89 kg/m   BP/Weight 10/15/2016 09/09/2016 9/56/3875  Systolic BP 643 329 -  Diastolic BP 84 77 -  Wt. (Lbs) 163.4 - 173  BMI 29.89 - 31.64   Physical Exam  Constitutional: She is oriented to person, place, and time. She appears well-developed and well-nourished. No distress.  HENT:  Head: Normocephalic and atraumatic.  Nose: Mucosal edema present. Right sinus exhibits frontal sinus tenderness. Left sinus exhibits frontal sinus tenderness.  Mouth/Throat: Oropharynx is clear and moist.  Cardiovascular: Normal rate, regular rhythm, normal heart sounds and intact distal pulses.   Pulmonary/Chest: Effort normal  and breath sounds normal.  Musculoskeletal: She exhibits no edema.  Neurological: She is alert and oriented to person, place, and time.  Skin: Skin is warm and dry. No rash noted.  Psychiatric: She has a normal mood and affect.    Assessment & Plan:   Problem List Items Addressed This Visit      High   Essential hypertension (Chronic)    A: HTN Med: compliant P:  Continue current regimen       Relevant Medications   hydrALAZINE (APRESOLINE) 50 MG tablet   lisinopril (PRINIVIL,ZESTRIL) 40 MG tablet   fenofibrate (TRICOR) 48 MG tablet   metoprolol succinate (TOPROL-XL) 100 MG 24 hr tablet   aspirin 81 MG tablet   Chronic low back pain - Primary (Chronic)    Has history of chronic pain Not complaining today Main pain is L  lateral distal femur, tramadol is controlling pain better than tylenol #3 did Patient is currently receiving tramadol Rx from ortho  Plan to continue tramadol if chronic pain Patient will need updated UDS and pain management contract       Relevant Medications   traMADol (ULTRAM) 50 MG tablet   meloxicam (MOBIC) 15 MG tablet   aspirin 81 MG tablet     Medium   Dyslipidemia (Chronic)    Continue fenofibrate Checking lipids Adding baby aspirin to reduced CVD risk       Relevant Medications   fenofibrate (TRICOR) 48 MG tablet   Other Relevant Orders   Lipid Panel     Unprioritized   Closed fracture of left distal femur (HCC)    Followed closely by ortho       Relevant Medications   meloxicam (MOBIC) 15 MG tablet      No orders of the defined types were placed in this encounter.   Follow-up: Return in about 3 months (around 01/15/2017) for HTN.   Boykin Nearing MD

## 2016-10-15 NOTE — Assessment & Plan Note (Signed)
Continue fenofibrate Checking lipids Adding baby aspirin to reduced CVD risk

## 2016-10-15 NOTE — Assessment & Plan Note (Signed)
A: HTN Med: compliant P:  Continue current regimen

## 2016-10-16 ENCOUNTER — Telehealth: Payer: Self-pay

## 2016-10-16 DIAGNOSIS — F17200 Nicotine dependence, unspecified, uncomplicated: Secondary | ICD-10-CM | POA: Diagnosis not present

## 2016-10-16 DIAGNOSIS — I10 Essential (primary) hypertension: Secondary | ICD-10-CM | POA: Diagnosis not present

## 2016-10-16 DIAGNOSIS — Z9181 History of falling: Secondary | ICD-10-CM | POA: Diagnosis not present

## 2016-10-16 DIAGNOSIS — W19XXXD Unspecified fall, subsequent encounter: Secondary | ICD-10-CM | POA: Diagnosis not present

## 2016-10-16 DIAGNOSIS — S72352D Displaced comminuted fracture of shaft of left femur, subsequent encounter for closed fracture with routine healing: Secondary | ICD-10-CM | POA: Diagnosis not present

## 2016-10-16 LAB — LIPID PANEL
CHOL/HDL RATIO: 1.9 ratio (ref 0.0–4.4)
CHOLESTEROL TOTAL: 155 mg/dL (ref 100–199)
HDL: 80 mg/dL (ref >39)
LDL Calculated: 60 mg/dL (ref 0–99)
TRIGLYCERIDES: 73 mg/dL (ref 0–149)
VLDL Cholesterol Cal: 15 mg/dL (ref 5–40)

## 2016-10-16 NOTE — Telephone Encounter (Signed)
Pt was called and informed of lab results. 

## 2016-10-19 DIAGNOSIS — S72352D Displaced comminuted fracture of shaft of left femur, subsequent encounter for closed fracture with routine healing: Secondary | ICD-10-CM | POA: Diagnosis not present

## 2016-10-19 DIAGNOSIS — Z4789 Encounter for other orthopedic aftercare: Secondary | ICD-10-CM | POA: Diagnosis not present

## 2016-10-20 DIAGNOSIS — Z9181 History of falling: Secondary | ICD-10-CM | POA: Diagnosis not present

## 2016-10-20 DIAGNOSIS — I10 Essential (primary) hypertension: Secondary | ICD-10-CM | POA: Diagnosis not present

## 2016-10-20 DIAGNOSIS — W19XXXD Unspecified fall, subsequent encounter: Secondary | ICD-10-CM | POA: Diagnosis not present

## 2016-10-20 DIAGNOSIS — S72352D Displaced comminuted fracture of shaft of left femur, subsequent encounter for closed fracture with routine healing: Secondary | ICD-10-CM | POA: Diagnosis not present

## 2016-10-20 DIAGNOSIS — F17200 Nicotine dependence, unspecified, uncomplicated: Secondary | ICD-10-CM | POA: Diagnosis not present

## 2016-10-23 DIAGNOSIS — F17200 Nicotine dependence, unspecified, uncomplicated: Secondary | ICD-10-CM | POA: Diagnosis not present

## 2016-10-23 DIAGNOSIS — I10 Essential (primary) hypertension: Secondary | ICD-10-CM | POA: Diagnosis not present

## 2016-10-23 DIAGNOSIS — W19XXXD Unspecified fall, subsequent encounter: Secondary | ICD-10-CM | POA: Diagnosis not present

## 2016-10-23 DIAGNOSIS — Z9181 History of falling: Secondary | ICD-10-CM | POA: Diagnosis not present

## 2016-10-23 DIAGNOSIS — S72352D Displaced comminuted fracture of shaft of left femur, subsequent encounter for closed fracture with routine healing: Secondary | ICD-10-CM | POA: Diagnosis not present

## 2016-10-26 DIAGNOSIS — W19XXXD Unspecified fall, subsequent encounter: Secondary | ICD-10-CM | POA: Diagnosis not present

## 2016-10-26 DIAGNOSIS — Z9181 History of falling: Secondary | ICD-10-CM | POA: Diagnosis not present

## 2016-10-26 DIAGNOSIS — S72352D Displaced comminuted fracture of shaft of left femur, subsequent encounter for closed fracture with routine healing: Secondary | ICD-10-CM | POA: Diagnosis not present

## 2016-10-26 DIAGNOSIS — F17200 Nicotine dependence, unspecified, uncomplicated: Secondary | ICD-10-CM | POA: Diagnosis not present

## 2016-10-26 DIAGNOSIS — I10 Essential (primary) hypertension: Secondary | ICD-10-CM | POA: Diagnosis not present

## 2016-10-28 DIAGNOSIS — I10 Essential (primary) hypertension: Secondary | ICD-10-CM | POA: Diagnosis not present

## 2016-10-28 DIAGNOSIS — W19XXXD Unspecified fall, subsequent encounter: Secondary | ICD-10-CM | POA: Diagnosis not present

## 2016-10-28 DIAGNOSIS — S72352D Displaced comminuted fracture of shaft of left femur, subsequent encounter for closed fracture with routine healing: Secondary | ICD-10-CM | POA: Diagnosis not present

## 2016-10-28 DIAGNOSIS — Z9181 History of falling: Secondary | ICD-10-CM | POA: Diagnosis not present

## 2016-10-28 DIAGNOSIS — F17200 Nicotine dependence, unspecified, uncomplicated: Secondary | ICD-10-CM | POA: Diagnosis not present

## 2016-11-16 DIAGNOSIS — S72352D Displaced comminuted fracture of shaft of left femur, subsequent encounter for closed fracture with routine healing: Secondary | ICD-10-CM | POA: Diagnosis not present

## 2016-11-16 DIAGNOSIS — Z4789 Encounter for other orthopedic aftercare: Secondary | ICD-10-CM | POA: Diagnosis not present

## 2017-01-15 ENCOUNTER — Encounter: Payer: Self-pay | Admitting: Internal Medicine

## 2017-01-15 ENCOUNTER — Ambulatory Visit: Payer: Medicare Other | Attending: Internal Medicine | Admitting: Internal Medicine

## 2017-01-15 VITALS — BP 147/98 | HR 65 | Temp 98.3°F | Resp 16 | Wt 167.4 lb

## 2017-01-15 DIAGNOSIS — M79605 Pain in left leg: Secondary | ICD-10-CM | POA: Insufficient documentation

## 2017-01-15 DIAGNOSIS — E669 Obesity, unspecified: Secondary | ICD-10-CM | POA: Insufficient documentation

## 2017-01-15 DIAGNOSIS — I10 Essential (primary) hypertension: Secondary | ICD-10-CM | POA: Insufficient documentation

## 2017-01-15 DIAGNOSIS — Z23 Encounter for immunization: Secondary | ICD-10-CM | POA: Diagnosis not present

## 2017-01-15 DIAGNOSIS — Z87891 Personal history of nicotine dependence: Secondary | ICD-10-CM | POA: Insufficient documentation

## 2017-01-15 DIAGNOSIS — M79604 Pain in right leg: Secondary | ICD-10-CM | POA: Insufficient documentation

## 2017-01-15 DIAGNOSIS — F1010 Alcohol abuse, uncomplicated: Secondary | ICD-10-CM | POA: Insufficient documentation

## 2017-01-15 DIAGNOSIS — Z7982 Long term (current) use of aspirin: Secondary | ICD-10-CM | POA: Insufficient documentation

## 2017-01-15 DIAGNOSIS — M545 Low back pain: Secondary | ICD-10-CM | POA: Diagnosis not present

## 2017-01-15 DIAGNOSIS — Z79899 Other long term (current) drug therapy: Secondary | ICD-10-CM | POA: Insufficient documentation

## 2017-01-15 DIAGNOSIS — G8929 Other chronic pain: Secondary | ICD-10-CM | POA: Diagnosis not present

## 2017-01-15 DIAGNOSIS — M961 Postlaminectomy syndrome, not elsewhere classified: Secondary | ICD-10-CM | POA: Insufficient documentation

## 2017-01-15 DIAGNOSIS — Z6830 Body mass index (BMI) 30.0-30.9, adult: Secondary | ICD-10-CM | POA: Insufficient documentation

## 2017-01-15 DIAGNOSIS — E785 Hyperlipidemia, unspecified: Secondary | ICD-10-CM | POA: Insufficient documentation

## 2017-01-15 NOTE — Progress Notes (Signed)
Patient ID: Shannon [name], female    DOB: 1955/07/12  MRN: [id number]  CC: re-establish; Hypertension; and Leg Pain (b/l)   Subjective: Shannon [name] is a 61 y.o. female who presents for chronic disease management. Last saw Dr. Adrian Blackwater 10/2016. Her concerns today include:  Patient with history of HTN, chronic lower back pain on tramadol and low back ( hx of L4-5 decompression surgery), HL, recent left distal femur fracture.  1. HTN: has meds with her. Out of Lisinopril x 3 wks. Had problems with transportation to and from drug store. She still has RFs. -limits salt in foods. Walks twice a wk for exercise.  No Cp/SOB/LE edema.   2. HL: compliant with Tricor. No muscle aches  3. Wgh: gained 4 lbs since last visit  4. Drinks two 40 oz beer a day. Drinks one in morning and one in evening.  States family has not expressed any concerns about the amount she drinks. She does not feel she has a problem with alcohol -does not smoke -no street drugs  Patient Active Problem List   Diagnosis Date Noted  . Immunization due 01/15/2017  . Obesity (BMI 30-39.9) 01/15/2017  . Alcohol use disorder, mild, abuse 01/15/2017  . Closed fracture of left distal femur ([place]) 09/03/2016  . Osteoarthritis of left knee 05/02/2015  . Postlaminectomy syndrome, lumbar region 11/09/2013  . Lumbosacral spondylosis without myelopathy 11/09/2013  . Dyslipidemia 10/11/2013  . Ventral hernia 05/19/2013  . DEPRESSION 06/13/2010  . ONYCHOMYCOSIS, TOENAILS 09/20/2009  . DIVERTICULOSIS, COLON 07/21/2009  . SYPHILIS 05/14/2009  . DYSHIDROTIC ECZEMA, HANDS 11/06/2008  . Chronic low back pain 11/06/2008  . FIBROIDS, UTERUS 11/17/2007  . Essential hypertension 11/17/2007     Current Outpatient Prescriptions on File Prior to Visit  Medication Sig Dispense Refill  . aspirin 81 MG tablet Take 1 tablet (81 mg total) by mouth daily. 90 tablet 3  . fenofibrate (TRICOR) 48 MG tablet Take 1 tablet (48 mg total) by  mouth daily. 90 tablet 3  . hydrALAZINE (APRESOLINE) 50 MG tablet Take 1 tablet (50 mg total) by mouth 3 (three) times daily. 270 tablet 3  . lisinopril (PRINIVIL,ZESTRIL) 40 MG tablet Take 1 tablet (40 mg total) by mouth daily. 90 tablet 3  . meloxicam (MOBIC) 15 MG tablet Take 1 tablet (15 mg total) by mouth daily. 90 tablet 1  . metoprolol succinate (TOPROL-XL) 100 MG 24 hr tablet Take 1 tablet (100 mg total) by mouth daily. Take with or immediately following a meal. 90 tablet 3   No current facility-administered medications on file prior to visit.     Allergies  Allergen Reactions  . Morphine And Related     Itch    Social History   Social History  . Marital status: Legally Separated    Spouse name: N/A  . Number of children: N/A  . Years of education: N/A   Occupational History  . Not on file.   Social History Main Topics  . Smoking status: Former Research scientist (life sciences)  . Smokeless tobacco: Never Used  . Alcohol use 1.2 oz/week    2 Cans of beer per week  . Drug use: No  . Sexual activity: Yes    Birth control/ protection: Surgical   Other Topics Concern  . Not on file   Social History Narrative   ** Merged History Encounter **        Family History  Problem Relation Age of Onset  . Stroke Mother   .  Breast cancer Sister     Past Surgical History:  Procedure Laterality Date  . ABDOMINAL HYSTERECTOMY    . BACK SURGERY  09/01/2010   L4-5 lumbar decompression including laminotomy, facetectomy,   . colonscopy    . FEMUR IM NAIL Left 09/05/2016  . FEMUR IM NAIL Left 09/05/2016   Procedure: INTRAMEDULLARY (IM) NAIL FEMORAL;  Surgeon: Nicholes Stairs, MD;  Location: [facility];  Service: Orthopedics;  Laterality: Left;  . FRACTURE SURGERY     left leg  . INSERTION OF MESH N/A 05/24/2013   Procedure: INSERTION OF MESH;  Surgeon: Imogene Burn. Georgette Dover, MD;  Location: [facility];  Service: General;  Laterality: N/A;  . UTERINE FIBROID SURGERY    . VENTRAL HERNIA REPAIR N/A 05/24/2013     Procedure: OPEN REPAIR EPIGASTRIC VENTRAL HERNIA;  Surgeon: Imogene Burn. Georgette Dover, MD;  Location: [facility];  Service: General;  Laterality: N/A;    ROS: Review of Systems  Respiratory: Negative for cough and shortness of breath.   Cardiovascular: Negative for chest pain and leg swelling.  Gastrointestinal: Negative for blood in stool and constipation.  Genitourinary: Negative for difficulty urinating.  Musculoskeletal:       Back doing ok on just Mobic  Psychiatric/Behavioral: Negative for dysphoric mood and sleep disturbance. The patient is not nervous/anxious.     PHYSICAL EXAM: BP (!) 147/98   Pulse 65   Temp 98.3 F (36.8 C) (Oral)   Resp 16   Wt 167 lb 6.4 oz (75.9 kg)   SpO2 98%   BMI 30.62 kg/m   Wt Readings from Last 3 Encounters:  01/15/17 167 lb 6.4 oz (75.9 kg)  10/15/16 163 lb 6.4 oz (74.1 kg)  09/03/16 173 lb (78.5 kg)  140/92  Physical Exam  General appearance - alert, well appearing, older African-American female and in no distress Mental status - alert, oriented to person, place, and time, normal mood, behavior, speech, dress, motor activity, and thought processes Neck - supple, no significant adenopathy Chest - clear to auscultation, no wheezes, rales or rhonchi, symmetric air entry Heart - normal rate, regular rhythm, normal S1, S2, no murmurs, rubs, clicks or gallops Extremities - peripheral pulses normal, no pedal edema, no clubbing or cyanosis   Depression screen [facility] 2/9 01/15/2017 10/15/2016 02/11/2016 11/08/2015 08/09/2015  Decreased Interest 0 0 1 1 1   Down, Depressed, Hopeless 1 0 2 1 0  PHQ - 2 Score 1 0 3 2 1   Altered sleeping - 0 2 - 0  Tired, decreased energy - 0 2 3 1   Change in appetite - 0 0 0 0  Feeling bad or failure about yourself  - 0 0 2 0  Trouble concentrating - 0 0 0 2  Moving slowly or fidgety/restless - 0 2 0 0  Suicidal thoughts - 0 0 0 0  PHQ-9 Score - 0 9 - 4    ASSESSMENT AND PLAN: 1. Essential hypertension  not at goal.  Plans to get refill on lisinopril today from her pharmacy  2. Dyslipidemia   3. Chronic bilateral low back pain without sciatica Stable on just Mobic. Tramadol taken off med list.   4. Need for influenza vaccination - Flu Vaccine QUAD 6+ mos PF IM (Fluarix Quad PF)  5. Alcohol use disorder, mild, abuse -Went over how much is too much in terms of alcoholic beverages a day for women. Recommend no more than 1 standard drink per day. She will try to cut back to one 12 ounce  beer a day  6. Obesity (BMI 30-39.9) -Discussed healthy eating and regular exercise not only to control weight. Prevent other obesity related diseases   Patient was given the opportunity to ask questions.  Patient verbalized understanding of the plan and was able to repeat key elements of the plan.   Orders Placed This Encounter  Procedures  . Flu Vaccine QUAD 6+ mos PF IM (Fluarix Quad PF)     Requested Prescriptions    No prescriptions requested or ordered in this encounter    Return in about 4 months (around 05/18/2017).  Karle Plumber, MD, FACP

## 2017-01-15 NOTE — Patient Instructions (Addendum)
You are drinking too much in one day. This can do damage to your liver. Please cut back to no more than one 12 oz beer a day.   Follow a Healthy Eating Plan - You can do it! Limit sugary drinks.  Avoid sodas, sweet tea, sport or energy drinks, or fruit drinks.  Drink water, lo-fat milk, or diet drinks. Limit snack foods.   Cut back on candy, cake, cookies, chips, ice cream.  These are a special treat, only in small amounts. Eat plenty of vegetables.  Especially dark green, red, and orange vegetables. Aim for at least 3 servings a day. More is better! Include fruit in your daily diet.  Whole fruit is much healthier than fruit juice! Limit "white" bread, "white" pasta, "white" rice.   Choose "100% whole grain" products, brown or wild rice. Avoid fatty meats. Try "Meatless Monday" and choose eggs or beans one day a week.  When eating meat, choose lean meats like chicken, [place], and fish.  Grill, broil, or bake meats instead of frying, and eat poultry without the skin. Eat less salt.  Avoid frozen pizzas, frozen dinners and salty foods.  Use seasonings other than salt in cooking.  This can help blood pressure and keep you from swelling Beer, wine and liquor have calories.  If you can safely drink alcohol, limit to 1 drink per day for women, 2 drinks for men   Influenza Virus Vaccine injection (Fluarix) What is this medicine? INFLUENZA VIRUS VACCINE (in floo EN zuh VAHY ruhs vak SEEN) helps to reduce the risk of getting influenza also known as the flu. This medicine may be used for other purposes; ask your health care provider or pharmacist if you have questions. COMMON BRAND NAME(S): Fluarix, Fluzone What should I tell my health care provider before I take this medicine? They need to know if you have any of these conditions: -bleeding disorder like hemophilia -fever or infection -Guillain-Barre syndrome or other neurological problems -immune system problems -infection with the human  immunodeficiency virus (HIV) or AIDS -low blood platelet counts -multiple sclerosis -an unusual or allergic reaction to influenza virus vaccine, eggs, chicken proteins, latex, gentamicin, other medicines, foods, dyes or preservatives -pregnant or trying to get pregnant -breast-feeding How should I use this medicine? This vaccine is for injection into a muscle. It is given by a health care professional. A copy of Vaccine Information Statements will be given before each vaccination. Read this sheet carefully each time. The sheet may change frequently. Talk to your pediatrician regarding the use of this medicine in children. Special care may be needed. Overdosage: If you think you have taken too much of this medicine contact a poison control center or emergency room at once. NOTE: This medicine is only for you. Do not share this medicine with others. What if I miss a dose? This does not apply. What may interact with this medicine? -chemotherapy or radiation therapy -medicines that lower your immune system like etanercept, anakinra, infliximab, and adalimumab -medicines that treat or prevent blood clots like warfarin -phenytoin -steroid medicines like prednisone or cortisone -theophylline -vaccines This list may not describe all possible interactions. Give your health care provider a list of all the medicines, herbs, non-prescription drugs, or dietary supplements you use. Also tell them if you smoke, drink alcohol, or use illegal drugs. Some items may interact with your medicine. What should I watch for while using this medicine? Report any side effects that do not go away within 3 days to your  doctor or health care professional. Call your health care provider if any unusual symptoms occur within 6 weeks of receiving this vaccine. You may still catch the flu, but the illness is not usually as bad. You cannot get the flu from the vaccine. The vaccine will not protect against colds or other  illnesses that may cause fever. The vaccine is needed every year. What side effects may I notice from receiving this medicine? Side effects that you should report to your doctor or health care professional as soon as possible: -allergic reactions like skin rash, itching or hives, swelling of the face, lips, or tongue Side effects that usually do not require medical attention (report to your doctor or health care professional if they continue or are bothersome): -fever -headache -muscle aches and pains -pain, tenderness, redness, or swelling at site where injected -weak or tired This list may not describe all possible side effects. Call your doctor for medical advice about side effects. You may report side effects to FDA at 1-800-FDA-1088. Where should I keep my medicine? This vaccine is only given in a clinic, pharmacy, doctor's office, or other health care setting and will not be stored at home. NOTE: This sheet is a summary. It may not cover all possible information. If you have questions about this medicine, talk to your doctor, pharmacist, or health care provider.  2018 Elsevier/Gold Standard (2007-10-26 09:30:40)

## 2017-03-12 DIAGNOSIS — M1732 Unilateral post-traumatic osteoarthritis, left knee: Secondary | ICD-10-CM | POA: Diagnosis not present

## 2017-03-12 DIAGNOSIS — S72352D Displaced comminuted fracture of shaft of left femur, subsequent encounter for closed fracture with routine healing: Secondary | ICD-10-CM | POA: Diagnosis not present

## 2017-04-16 ENCOUNTER — Ambulatory Visit: Payer: Medicare Other | Admitting: Internal Medicine

## 2017-05-04 ENCOUNTER — Other Ambulatory Visit: Payer: Self-pay | Admitting: Internal Medicine

## 2017-05-04 ENCOUNTER — Encounter: Payer: Self-pay | Admitting: Internal Medicine

## 2017-05-04 ENCOUNTER — Ambulatory Visit: Payer: Medicare Other | Attending: Internal Medicine | Admitting: Internal Medicine

## 2017-05-04 VITALS — BP 130/85 | HR 60 | Temp 97.7°F | Resp 16 | Ht 64.0 in | Wt 176.4 lb

## 2017-05-04 DIAGNOSIS — I10 Essential (primary) hypertension: Secondary | ICD-10-CM

## 2017-05-04 DIAGNOSIS — Z79899 Other long term (current) drug therapy: Secondary | ICD-10-CM | POA: Diagnosis not present

## 2017-05-04 DIAGNOSIS — Z90710 Acquired absence of both cervix and uterus: Secondary | ICD-10-CM | POA: Diagnosis not present

## 2017-05-04 DIAGNOSIS — M545 Low back pain, unspecified: Secondary | ICD-10-CM

## 2017-05-04 DIAGNOSIS — R0981 Nasal congestion: Secondary | ICD-10-CM

## 2017-05-04 DIAGNOSIS — Z7982 Long term (current) use of aspirin: Secondary | ICD-10-CM | POA: Diagnosis not present

## 2017-05-04 DIAGNOSIS — F1010 Alcohol abuse, uncomplicated: Secondary | ICD-10-CM | POA: Diagnosis not present

## 2017-05-04 DIAGNOSIS — E785 Hyperlipidemia, unspecified: Secondary | ICD-10-CM | POA: Diagnosis not present

## 2017-05-04 DIAGNOSIS — Z9889 Other specified postprocedural states: Secondary | ICD-10-CM | POA: Diagnosis not present

## 2017-05-04 DIAGNOSIS — S72402A Unspecified fracture of lower end of left femur, initial encounter for closed fracture: Secondary | ICD-10-CM | POA: Diagnosis not present

## 2017-05-04 DIAGNOSIS — G8929 Other chronic pain: Secondary | ICD-10-CM | POA: Diagnosis not present

## 2017-05-04 DIAGNOSIS — Z87891 Personal history of nicotine dependence: Secondary | ICD-10-CM | POA: Diagnosis not present

## 2017-05-04 MED ORDER — FLUTICASONE PROPIONATE 50 MCG/ACT NA SUSP
2.0000 | Freq: Every day | NASAL | 0 refills | Status: DC
Start: 2017-05-04 — End: 2020-07-04

## 2017-05-04 MED ORDER — MELOXICAM 15 MG PO TABS: 15.0000 mg | ORAL_TABLET | Freq: Every day | ORAL | 1 refills | Status: DC

## 2017-05-04 NOTE — Progress Notes (Signed)
Patient ID: Shannon [name], female    DOB: 05-Apr-1956  MRN: [id number]  CC: Follow-up (3 month); Back Pain; and Leg Pain   Subjective: Shannon [name] is a 62 y.o. female who presents for chronid ds management.  Last seen 01/2017 Her concerns today include:  Patient with history of HTN, chronic lower back pain ( hx of L4-5 decompression surgery), HL, ETOH use disorder.  1.  "My body just hurt all the time."  Mainly in lower back and L leg On Tramadol from Dr. Stann Mainland, her orthopedic specialist, for LT leg pain.  Had surgery last yr for LT distal femur fracture.  She takes one every 6 hrs.  She finds it helpful.  2.   HTN: compliant with meds.  NO device to check BP.  Limits salt in foods No CP/SOB/LE edema  3.  HL:  Tolerating Tricor  4.  ETOH use disorder:  She cut back from two 40 oz beers to two 16 oz beers a day.   5.  C/o nasal congestion, sneezing and lacrimation since yesterday. No cough or SOB.   Patient Active Problem List   Diagnosis Date Noted  . Immunization due 01/15/2017  . Obesity (BMI 30-39.9) 01/15/2017  . Alcohol use disorder, mild, abuse 01/15/2017  . Closed fracture of left distal femur ([place]) 09/03/2016  . Osteoarthritis of left knee 05/02/2015  . Postlaminectomy syndrome, lumbar region 11/09/2013  . Lumbosacral spondylosis without myelopathy 11/09/2013  . Dyslipidemia 10/11/2013  . Ventral hernia 05/19/2013  . DEPRESSION 06/13/2010  . ONYCHOMYCOSIS, TOENAILS 09/20/2009  . DIVERTICULOSIS, COLON 07/21/2009  . SYPHILIS 05/14/2009  . DYSHIDROTIC ECZEMA, HANDS 11/06/2008  . Chronic low back pain 11/06/2008  . FIBROIDS, UTERUS 11/17/2007  . Essential hypertension 11/17/2007     Current Outpatient Medications on File Prior to Visit  Medication Sig Dispense Refill  . fenofibrate (TRICOR) 48 MG tablet Take 1 tablet (48 mg total) by mouth daily. 90 tablet 3  . hydrALAZINE (APRESOLINE) 50 MG tablet Take 1 tablet (50 mg total) by mouth 3 (three) times  daily. 270 tablet 3  . lisinopril (PRINIVIL,ZESTRIL) 40 MG tablet Take 1 tablet (40 mg total) by mouth daily. 90 tablet 3  . meloxicam (MOBIC) 15 MG tablet Take 1 tablet (15 mg total) by mouth daily. 90 tablet 1  . metoprolol succinate (TOPROL-XL) 100 MG 24 hr tablet Take 1 tablet (100 mg total) by mouth daily. Take with or immediately following a meal. 90 tablet 3  . aspirin 81 MG tablet Take 1 tablet (81 mg total) by mouth daily. (Patient not taking: Reported on 05/04/2017) 90 tablet 3   No current facility-administered medications on file prior to visit.     Allergies  Allergen Reactions  . Morphine And Related     Itch    Social History   Socioeconomic History  . Marital status: Legally Separated    Spouse name: Not on file  . Number of children: Not on file  . Years of education: Not on file  . Highest education level: Not on file  Social Needs  . Financial resource strain: Not on file  . Food insecurity - worry: Not on file  . Food insecurity - inability: Not on file  . Transportation needs - medical: Not on file  . Transportation needs - non-medical: Not on file  Occupational History  . Not on file  Tobacco Use  . Smoking status: Former Research scientist (life sciences)  . Smokeless tobacco: Never Used  Substance and Sexual  Activity  . Alcohol use: Yes    Alcohol/week: 1.2 oz    Types: 2 Cans of beer per week  . Drug use: No  . Sexual activity: Yes    Birth control/protection: Surgical  Other Topics Concern  . Not on file  Social History Narrative   ** Merged History Encounter **        Family History  Problem Relation Age of Onset  . Stroke Mother   . Breast cancer Sister     Past Surgical History:  Procedure Laterality Date  . ABDOMINAL HYSTERECTOMY    . BACK SURGERY  09/01/2010   L4-5 lumbar decompression including laminotomy, facetectomy,   . colonscopy    . FEMUR IM NAIL Left 09/05/2016  . FEMUR IM NAIL Left 09/05/2016   Procedure: INTRAMEDULLARY (IM) NAIL FEMORAL;   Surgeon: Nicholes Stairs, MD;  Location: [facility];  Service: Orthopedics;  Laterality: Left;  . FRACTURE SURGERY     left leg  . INSERTION OF MESH N/A 05/24/2013   Procedure: INSERTION OF MESH;  Surgeon: Imogene Burn. Georgette Dover, MD;  Location: [facility];  Service: General;  Laterality: N/A;  . UTERINE FIBROID SURGERY    . VENTRAL HERNIA REPAIR N/A 05/24/2013   Procedure: OPEN REPAIR EPIGASTRIC VENTRAL HERNIA;  Surgeon: Imogene Burn. Tsuei, MD;  Location: [facility];  Service: General;  Laterality: N/A;    ROS: Review of Systems Neg except as above  PHYSICAL EXAM: BP (!) 155/95   Pulse 60   Temp 97.7 F (36.5 C) (Oral)   Resp 16   Ht 5\' 4"  (1.626 m)   Wt 176 lb 6.4 oz (80 kg)   SpO2 99%   BMI 30.28 kg/m   Wt Readings from Last 3 Encounters:  05/04/17 176 lb 6.4 oz (80 kg)  01/15/17 167 lb 6.4 oz (75.9 kg)  10/15/16 163 lb 6.4 oz (74.1 kg)   Repeat BP 130/85  Physical Exam  General appearance - alert, well appearing, and in no distress.  Sounds congested nasally Mental status - alert, oriented to person, place, and time, normal mood, behavior, speech, dress, motor activity, and thought processes Eyes - pupils equal and reactive, extraocular eye movements intact Nose - moderate enlargement of nasal turbinates Mouth - mucous membranes moist, pharynx normal without lesions Neck - supple, no significant adenopathy Chest - clear to auscultation, no wheezes, rales or rhonchi, symmetric air entry Heart - normal rate, regular rhythm, normal S1, S2, no murmurs, rubs, clicks or gallops Extremities - peripheral pulses normal, no pedal edema, no clubbing or cyanosis MSK: mild enlargement of knee jts.  Moderate crepitus on passive movement  Lab Results  Component Value Date   CHOL 155 10/15/2016   HDL 80 10/15/2016   LDLCALC 60 10/15/2016   TRIG 73 10/15/2016   CHOLHDL 1.9 10/15/2016    ASSESSMENT AND PLAN: 1. Essential hypertension At goal.  Continue current meds - CBC - Comprehensive  metabolic panel  2. Dyslipidemia Continue Tricor  3. Alcohol use disorder, mild, abuse -encouraged her to cut back more to one 16 oz beer a day  4. Chronic bilateral low back pain without sciatica - meloxicam (MOBIC) 15 MG tablet; Take 1 tablet (15 mg total) by mouth daily.  Dispense: 90 tablet; Refill: 1  5. Closed fracture of distal end of left femur, unspecified fracture morphology, initial encounter ([place]) I have added the Tramadol to med list.  Pt gets through ortho.  6. Sinus congestion - fluticasone (FLONASE) 50 MCG/ACT nasal  spray; Place 2 sprays into both nostrils daily.  Dispense: 16 g; Refill: 0   Patient was given the opportunity to ask questions.  Patient verbalized understanding of the plan and was able to repeat key elements of the plan.   No orders of the defined types were placed in this encounter.    Requested Prescriptions    No prescriptions requested or ordered in this encounter    No Follow-up on file.  Karle Plumber, MD, FACP

## 2017-05-04 NOTE — Patient Instructions (Addendum)
Try to cut back to one 16 oz beer a day as discussed.   Use the Flonase nasal spray for the sinus congestion.

## 2017-05-04 NOTE — Progress Notes (Signed)
Pt is having pain in her lower back and in both of her legs  Pt is needing medication refills

## 2017-05-10 ENCOUNTER — Other Ambulatory Visit: Payer: Self-pay | Admitting: Internal Medicine

## 2017-05-10 DIAGNOSIS — Z1231 Encounter for screening mammogram for malignant neoplasm of breast: Secondary | ICD-10-CM

## 2017-05-28 ENCOUNTER — Ambulatory Visit: Payer: Medicare Other

## 2017-05-28 ENCOUNTER — Ambulatory Visit
Admission: RE | Admit: 2017-05-28 | Discharge: 2017-05-28 | Disposition: A | Discharge status: Home / Self Care | Payer: Medicare Other | Source: Ambulatory Visit | Attending: Internal Medicine | Admitting: Internal Medicine

## 2017-05-28 DIAGNOSIS — Z1231 Encounter for screening mammogram for malignant neoplasm of breast: Secondary | ICD-10-CM | POA: Diagnosis not present

## 2017-06-21 DIAGNOSIS — M1711 Unilateral primary osteoarthritis, right knee: Secondary | ICD-10-CM | POA: Diagnosis not present

## 2017-06-21 DIAGNOSIS — M170 Bilateral primary osteoarthritis of knee: Secondary | ICD-10-CM | POA: Diagnosis not present

## 2017-06-21 DIAGNOSIS — M47896 Other spondylosis, lumbar region: Secondary | ICD-10-CM | POA: Diagnosis not present

## 2017-06-21 DIAGNOSIS — Z4789 Encounter for other orthopedic aftercare: Secondary | ICD-10-CM | POA: Diagnosis not present

## 2017-06-21 DIAGNOSIS — M1712 Unilateral primary osteoarthritis, left knee: Secondary | ICD-10-CM | POA: Diagnosis not present

## 2017-08-02 ENCOUNTER — Encounter: Payer: Self-pay | Admitting: Internal Medicine

## 2017-08-02 ENCOUNTER — Ambulatory Visit: Payer: Medicare Other | Attending: Internal Medicine | Admitting: Internal Medicine

## 2017-08-02 VITALS — BP 156/99 | HR 52 | Temp 97.7°F | Resp 16 | Wt 181.4 lb

## 2017-08-02 DIAGNOSIS — I10 Essential (primary) hypertension: Secondary | ICD-10-CM | POA: Diagnosis present

## 2017-08-02 DIAGNOSIS — Z87891 Personal history of nicotine dependence: Secondary | ICD-10-CM | POA: Insufficient documentation

## 2017-08-02 DIAGNOSIS — M545 Low back pain: Secondary | ICD-10-CM | POA: Insufficient documentation

## 2017-08-02 DIAGNOSIS — F1010 Alcohol abuse, uncomplicated: Secondary | ICD-10-CM

## 2017-08-02 DIAGNOSIS — Z90710 Acquired absence of both cervix and uterus: Secondary | ICD-10-CM | POA: Diagnosis not present

## 2017-08-02 DIAGNOSIS — Z79899 Other long term (current) drug therapy: Secondary | ICD-10-CM | POA: Diagnosis not present

## 2017-08-02 DIAGNOSIS — E785 Hyperlipidemia, unspecified: Secondary | ICD-10-CM | POA: Diagnosis not present

## 2017-08-02 DIAGNOSIS — M170 Bilateral primary osteoarthritis of knee: Secondary | ICD-10-CM | POA: Diagnosis not present

## 2017-08-02 DIAGNOSIS — Z79891 Long term (current) use of opiate analgesic: Secondary | ICD-10-CM | POA: Insufficient documentation

## 2017-08-02 DIAGNOSIS — Z9889 Other specified postprocedural states: Secondary | ICD-10-CM | POA: Diagnosis not present

## 2017-08-02 MED ORDER — HYDROCHLOROTHIAZIDE 12.5 MG PO CAPS: 12.5000 mg | ORAL_CAPSULE | Freq: Every day | ORAL | 1 refills | Status: DC

## 2017-08-02 NOTE — Patient Instructions (Signed)
Please give nurse visit for BP check and lab only visit in 1 week.  Your blood pressure is elevated.  The goal is to be less than 130/80.  We have added a medication called Hydrochlorothiazide.  Please take it daily.  Return to the laboratory in 1 week to have labs done.   Try to quit drinking alcoholic beverages completely.  You can do it!!!!

## 2017-08-02 NOTE — Progress Notes (Signed)
Patient ID: JAYAH BALTHAZAR, female    DOB: [birth year]  MRN: [id number]  CC: Hypertension   Subjective: Marlaine Arey is a 62 y.o. female who presents for chronic ds management. Her concerns today include:  Patient with history of HTN, chronic lower back pain ( hx of L4-5 decompression surgery), HL, ETOH use disorder.  1.  HTN:  Compliant with meds and salt restriction.  Not taking ASA No CP/SOB/LE edema  2.  LT leg pain: still sees Dr. Stann Mainland, her ortho specialist for LT knee pain.  Had surgery 2018 for fx of distal femur.  RT knee starting to bother her. Little swelling.  No stiffness.  Taking Mobic and Tramadol -does a lot of walking  3.  HL:  Compliant with tricor and tolerating it well.  4.  ETOH:  Down to one 16 oz can of beer a day.  "I'm trying to quit drinking." Patient Active Problem List   Diagnosis Date Noted  . Immunization due 01/15/2017  . Obesity (BMI 30-39.9) 01/15/2017  . Alcohol use disorder, mild, abuse 01/15/2017  . Closed fracture of left distal femur ([place]) 09/03/2016  . Osteoarthritis of left knee 05/02/2015  . Postlaminectomy syndrome, lumbar region 11/09/2013  . Lumbosacral spondylosis without myelopathy 11/09/2013  . Dyslipidemia 10/11/2013  . Ventral hernia 05/19/2013  . DEPRESSION 06/13/2010  . ONYCHOMYCOSIS, TOENAILS 09/20/2009  . DIVERTICULOSIS, COLON 07/21/2009  . SYPHILIS 05/14/2009  . DYSHIDROTIC ECZEMA, HANDS 11/06/2008  . Chronic low back pain 11/06/2008  . FIBROIDS, UTERUS 11/17/2007  . Essential hypertension 11/17/2007     Current Outpatient Medications on File Prior to Visit  Medication Sig Dispense Refill  . fenofibrate (TRICOR) 48 MG tablet Take 1 tablet (48 mg total) by mouth daily. 90 tablet 3  . fluticasone (FLONASE) 50 MCG/ACT nasal spray Place 2 sprays into both nostrils daily. 16 g 0  . hydrALAZINE (APRESOLINE) 50 MG tablet Take 1 tablet (50 mg total) by mouth 3 (three) times daily. 270 tablet 3  . lisinopril  (PRINIVIL,ZESTRIL) 40 MG tablet Take 1 tablet (40 mg total) by mouth daily. 90 tablet 3  . meloxicam (MOBIC) 15 MG tablet Take 1 tablet (15 mg total) by mouth daily. 90 tablet 1  . metoprolol succinate (TOPROL-XL) 100 MG 24 hr tablet Take 1 tablet (100 mg total) by mouth daily. Take with or immediately following a meal. 90 tablet 3  . traMADol (ULTRAM) 50 MG tablet TK 1 T PO Q 6 H PRN P  2   No current facility-administered medications on file prior to visit.     Allergies  Allergen Reactions  . Morphine And Related     Itch    Social History   Socioeconomic History  . Marital status: Legally Separated    Spouse name: Not on file  . Number of children: Not on file  . Years of education: Not on file  . Highest education level: Not on file  Occupational History  . Not on file  Social Needs  . Financial resource strain: Not on file  . Food insecurity:    Worry: Not on file    Inability: Not on file  . Transportation needs:    Medical: Not on file    Non-medical: Not on file  Tobacco Use  . Smoking status: Former Research scientist (life sciences)  . Smokeless tobacco: Never Used  Substance and Sexual Activity  . Alcohol use: Yes    Alcohol/week: 1.2 oz    Types: 2 Cans of beer per  week  . Drug use: No  . Sexual activity: Yes    Birth control/protection: Surgical  Lifestyle  . Physical activity:    Days per week: Not on file    Minutes per session: Not on file  . Stress: Not on file  Relationships  . Social connections:    Talks on phone: Not on file    Gets together: Not on file    Attends religious service: Not on file    Active member of club or organization: Not on file    Attends meetings of clubs or organizations: Not on file    Relationship status: Not on file  . Intimate partner violence:    Fear of current or ex partner: Not on file    Emotionally abused: Not on file    Physically abused: Not on file    Forced sexual activity: Not on file  Other Topics Concern  . Not on file    Social History Narrative   ** Merged History Encounter **        Family History  Problem Relation Age of Onset  . Stroke Mother   . Breast cancer Sister     Past Surgical History:  Procedure Laterality Date  . ABDOMINAL HYSTERECTOMY    . BACK SURGERY  09/01/2010   L4-5 lumbar decompression including laminotomy, facetectomy,   . colonscopy    . FEMUR IM NAIL Left 09/05/2016  . FEMUR IM NAIL Left 09/05/2016   Procedure: INTRAMEDULLARY (IM) NAIL FEMORAL;  Surgeon: Nicholes Stairs, MD;  Location: [facility];  Service: Orthopedics;  Laterality: Left;  . FRACTURE SURGERY     left leg  . INSERTION OF MESH N/A 05/24/2013   Procedure: INSERTION OF MESH;  Surgeon: Imogene Burn. Georgette Dover, MD;  Location: [facility];  Service: General;  Laterality: N/A;  . UTERINE FIBROID SURGERY    . VENTRAL HERNIA REPAIR N/A 05/24/2013   Procedure: OPEN REPAIR EPIGASTRIC VENTRAL HERNIA;  Surgeon: Imogene Burn. Tsuei, MD;  Location: [facility];  Service: General;  Laterality: N/A;    ROS: Review of Systems Negative except as stated above PHYSICAL EXAM: BP (!) 156/99   Pulse (!) 52   Temp 97.7 F (36.5 C) (Oral)   Resp 16   Wt 181 lb 6.4 oz (82.3 kg)   SpO2 100%   BMI 31.14 kg/m   Wt Readings from Last 3 Encounters:  08/02/17 181 lb 6.4 oz (82.3 kg)  05/04/17 176 lb 6.4 oz (80 kg)  01/15/17 167 lb 6.4 oz (75.9 kg)  Repeat BP 150/92  Physical Exam  General appearance - alert, well appearing, and in no distress Mental status - alert, oriented to person, place, and time, normal mood, behavior, speech, dress, motor activity, and thought processes Chest - clear to auscultation, no wheezes, rales or rhonchi, symmetric air entry Heart - normal rate, regular rhythm, normal S1, S2, no murmurs, rubs, clicks or gallops Musculoskeletal -good range of motion of the knees. Extremities -no lower extremity edema.  ASSESSMENT AND PLAN: 1. Essential hypertension Not at goal.  Continue DASH diet.  Add  hydrochlorothiazide. -Patient due for routine lab tests which were ordered on last visit.  I have advised her to return to the lab after she has been on the HCTZ for 1 week for routine blood test. - hydrochlorothiazide (MICROZIDE) 12.5 MG capsule; Take 1 capsule (12.5 mg total) by mouth daily.  Dispense: 45 capsule; Refill: 1  2. Dyslipidemia Continue TriCor.  3. Osteoarthritis of both knees,  unspecified osteoarthritis type Continue meloxicam.  Encouraged her to continue walking daily for exercise.  4. Alcohol use disorder, mild, abuse Commended her for cutting back even more.  Encouraged her to try to become alcohol free.  Patient was given the opportunity to ask questions.  Patient verbalized understanding of the plan and was able to repeat key elements of the plan.   No orders of the defined types were placed in this encounter.    Requested Prescriptions   Signed Prescriptions Disp Refills  . hydrochlorothiazide (MICROZIDE) 12.5 MG capsule 45 capsule 1    Sig: Take 1 capsule (12.5 mg total) by mouth daily.    Return in about 3 months (around 11/01/2017).  Karle Plumber, MD, FACP

## 2017-08-03 DIAGNOSIS — E559 Vitamin D deficiency, unspecified: Secondary | ICD-10-CM | POA: Diagnosis not present

## 2017-08-03 DIAGNOSIS — Z79899 Other long term (current) drug therapy: Secondary | ICD-10-CM | POA: Diagnosis not present

## 2017-08-03 DIAGNOSIS — M545 Low back pain: Secondary | ICD-10-CM | POA: Diagnosis not present

## 2017-08-03 DIAGNOSIS — M79604 Pain in right leg: Secondary | ICD-10-CM | POA: Diagnosis not present

## 2017-08-03 DIAGNOSIS — M129 Arthropathy, unspecified: Secondary | ICD-10-CM | POA: Diagnosis not present

## 2017-08-03 DIAGNOSIS — M79605 Pain in left leg: Secondary | ICD-10-CM | POA: Diagnosis not present

## 2017-08-06 DIAGNOSIS — M79604 Pain in right leg: Secondary | ICD-10-CM | POA: Diagnosis not present

## 2017-08-06 DIAGNOSIS — M545 Low back pain: Secondary | ICD-10-CM | POA: Diagnosis not present

## 2017-08-06 DIAGNOSIS — M79605 Pain in left leg: Secondary | ICD-10-CM | POA: Diagnosis not present

## 2017-08-09 ENCOUNTER — Other Ambulatory Visit: Payer: Medicare Other

## 2017-08-23 DIAGNOSIS — M545 Low back pain: Secondary | ICD-10-CM | POA: Diagnosis not present

## 2017-08-23 DIAGNOSIS — G8929 Other chronic pain: Secondary | ICD-10-CM | POA: Diagnosis not present

## 2017-08-23 DIAGNOSIS — E559 Vitamin D deficiency, unspecified: Secondary | ICD-10-CM | POA: Diagnosis not present

## 2017-08-23 DIAGNOSIS — Z87891 Personal history of nicotine dependence: Secondary | ICD-10-CM | POA: Diagnosis not present

## 2017-08-23 DIAGNOSIS — Z79899 Other long term (current) drug therapy: Secondary | ICD-10-CM | POA: Diagnosis not present

## 2017-08-23 DIAGNOSIS — F17200 Nicotine dependence, unspecified, uncomplicated: Secondary | ICD-10-CM | POA: Diagnosis not present

## 2017-09-20 DIAGNOSIS — M79605 Pain in left leg: Secondary | ICD-10-CM | POA: Diagnosis not present

## 2017-09-20 DIAGNOSIS — M545 Low back pain: Secondary | ICD-10-CM | POA: Diagnosis not present

## 2017-09-20 DIAGNOSIS — Z79899 Other long term (current) drug therapy: Secondary | ICD-10-CM | POA: Diagnosis not present

## 2017-09-20 DIAGNOSIS — G8929 Other chronic pain: Secondary | ICD-10-CM | POA: Diagnosis not present

## 2017-09-20 DIAGNOSIS — M79604 Pain in right leg: Secondary | ICD-10-CM | POA: Diagnosis not present

## 2017-10-25 DIAGNOSIS — Z79899 Other long term (current) drug therapy: Secondary | ICD-10-CM | POA: Diagnosis not present

## 2017-10-25 DIAGNOSIS — M79604 Pain in right leg: Secondary | ICD-10-CM | POA: Diagnosis not present

## 2017-10-25 DIAGNOSIS — M545 Low back pain: Secondary | ICD-10-CM | POA: Diagnosis not present

## 2017-10-25 DIAGNOSIS — G8929 Other chronic pain: Secondary | ICD-10-CM | POA: Diagnosis not present

## 2017-10-25 DIAGNOSIS — M79605 Pain in left leg: Secondary | ICD-10-CM | POA: Diagnosis not present

## 2017-10-29 ENCOUNTER — Other Ambulatory Visit: Payer: Self-pay | Admitting: Internal Medicine

## 2017-10-29 ENCOUNTER — Telehealth: Payer: Self-pay | Admitting: Internal Medicine

## 2017-10-29 DIAGNOSIS — I10 Essential (primary) hypertension: Secondary | ICD-10-CM

## 2017-10-29 MED ORDER — HYDROCHLOROTHIAZIDE 12.5 MG PO CAPS: 12.5000 mg | ORAL_CAPSULE | Freq: Every day | ORAL | 0 refills | Status: DC

## 2017-10-29 NOTE — Telephone Encounter (Signed)
Pt called to request a medication refill  -hydrochlorothiazide (MICROZIDE) 12.5 MG capsule  To  -[facility] [phone] - [place], [place] - [address] AT [address] Please follow up

## 2017-11-05 ENCOUNTER — Ambulatory Visit: Payer: Medicare Other | Admitting: Internal Medicine

## 2017-11-12 ENCOUNTER — Ambulatory Visit: Payer: Medicare Other | Attending: Internal Medicine | Admitting: Internal Medicine

## 2017-11-12 ENCOUNTER — Encounter: Payer: Self-pay | Admitting: Internal Medicine

## 2017-11-12 VITALS — BP 138/80 | HR 52 | Temp 98.1°F | Resp 16 | Wt 178.2 lb

## 2017-11-12 DIAGNOSIS — G8929 Other chronic pain: Secondary | ICD-10-CM | POA: Diagnosis not present

## 2017-11-12 DIAGNOSIS — I10 Essential (primary) hypertension: Secondary | ICD-10-CM

## 2017-11-12 DIAGNOSIS — Z885 Allergy status to narcotic agent status: Secondary | ICD-10-CM | POA: Diagnosis not present

## 2017-11-12 DIAGNOSIS — Z87891 Personal history of nicotine dependence: Secondary | ICD-10-CM | POA: Insufficient documentation

## 2017-11-12 DIAGNOSIS — H5203 Hypermetropia, bilateral: Secondary | ICD-10-CM | POA: Diagnosis not present

## 2017-11-12 DIAGNOSIS — F1010 Alcohol abuse, uncomplicated: Secondary | ICD-10-CM

## 2017-11-12 DIAGNOSIS — M170 Bilateral primary osteoarthritis of knee: Secondary | ICD-10-CM | POA: Insufficient documentation

## 2017-11-12 DIAGNOSIS — F329 Major depressive disorder, single episode, unspecified: Secondary | ICD-10-CM | POA: Diagnosis not present

## 2017-11-12 DIAGNOSIS — M545 Low back pain: Secondary | ICD-10-CM | POA: Insufficient documentation

## 2017-11-12 DIAGNOSIS — Z791 Long term (current) use of non-steroidal anti-inflammatories (NSAID): Secondary | ICD-10-CM | POA: Insufficient documentation

## 2017-11-12 DIAGNOSIS — E785 Hyperlipidemia, unspecified: Secondary | ICD-10-CM | POA: Diagnosis not present

## 2017-11-12 DIAGNOSIS — Z79899 Other long term (current) drug therapy: Secondary | ICD-10-CM | POA: Insufficient documentation

## 2017-11-12 MED ORDER — HYDROCHLOROTHIAZIDE 12.5 MG PO CAPS: 12.5000 mg | ORAL_CAPSULE | Freq: Every day | ORAL | 3 refills | Status: DC

## 2017-11-12 MED ORDER — METOPROLOL SUCCINATE ER 100 MG PO TB24: 100.0000 mg | ORAL_TABLET | Freq: Every day | ORAL | 3 refills | Status: DC

## 2017-11-12 MED ORDER — FENOFIBRATE 48 MG PO TABS: 48.0000 mg | ORAL_TABLET | Freq: Every day | ORAL | 3 refills | Status: DC

## 2017-11-12 MED ORDER — HYDRALAZINE HCL 50 MG PO TABS: 50.0000 mg | ORAL_TABLET | Freq: Three times a day (TID) | ORAL | 3 refills | Status: DC

## 2017-11-12 MED ORDER — LISINOPRIL 40 MG PO TABS: 40.0000 mg | ORAL_TABLET | Freq: Every day | ORAL | 3 refills | Status: DC

## 2017-11-12 NOTE — Patient Instructions (Signed)
Try to decrease beer consumption to one 8 oz can a day as discussed.

## 2017-11-12 NOTE — Progress Notes (Signed)
Patient ID: Shannon [name], female    DOB: 1955/04/15  MRN: [id number]  CC: Hypertension   Subjective: Shannon [name] is a 62 y.o. female who presents for chronic ds management. Her concerns today include:  Patient with history of HTN, chronic lower back pain ( hx of L4-5 decompression surgery), HL,ETOH use disorder, OA knees.  HYPERTENSION Currently taking: see medication list Med Adherence: [x]  Yes, HCTZ added on last visit.  Out of HCTZ x 1 wk.  Needs RF on all BP meds    []  No Medication side effects: []  Yes    [x]  No Adherence with salt restriction: [x]  Yes    []  No Home Monitoring?: []  Yes    [x]  No Monitoring Frequency: []  Yes    []  No Home BP results range: []  Yes    []  No SOB? []  Yes    [x]  No Chest Pain?: []  Yes    [x]  No Leg swelling?: []  Yes    [x]  No Headaches?: []  Yes    [x]  No Dizziness? []  Yes    [x]  No Comments:   ETOH: still at one 16 oz can a day.  No N/V/abd pain  HL:  Compliant and tolerating Tricor  Having problem seeing things close up for a while.  Wears OTC reading glasses which do not help  OA knees: Referred to [facility] clinic by her orthopedic specialist Dr. Stann Mainland for management of chronic knee pain.  Right knee worse than the left.  She has had arthroscopic surgery on the left in the past.   -Pain specialist at [facility] is Dr. Royce Macadamia.  Tramadol changed to [place] patch.   - No falls, ambulates with cane.   Patient Active Problem List   Diagnosis Date Noted  . Immunization due 01/15/2017  . Obesity (BMI 30-39.9) 01/15/2017  . Alcohol use disorder, mild, abuse 01/15/2017  . Closed fracture of left distal femur ([place]) 09/03/2016  . Osteoarthritis of left knee 05/02/2015  . Postlaminectomy syndrome, lumbar region 11/09/2013  . Lumbosacral spondylosis without myelopathy 11/09/2013  . Dyslipidemia 10/11/2013  . Ventral hernia 05/19/2013  . DEPRESSION 06/13/2010  . ONYCHOMYCOSIS, TOENAILS 09/20/2009  . DIVERTICULOSIS, COLON  07/21/2009  . SYPHILIS 05/14/2009  . DYSHIDROTIC ECZEMA, HANDS 11/06/2008  . Chronic low back pain 11/06/2008  . FIBROIDS, UTERUS 11/17/2007  . Essential hypertension 11/17/2007     Current Outpatient Medications on File Prior to Visit  Medication Sig Dispense Refill  . buprenorphine (BUTRANS) 20 MCG/HR PTWK patch Place 20 mcg onto the skin once a week.    . fluticasone (FLONASE) 50 MCG/ACT nasal spray Place 2 sprays into both nostrils daily. 16 g 0  . meloxicam (MOBIC) 15 MG tablet Take 1 tablet (15 mg total) by mouth daily. 90 tablet 1   No current facility-administered medications on file prior to visit.     Allergies  Allergen Reactions  . Morphine And Related     Itch    Social History   Socioeconomic History  . Marital status: Legally Separated    Spouse name: Not on file  . Number of children: Not on file  . Years of education: Not on file  . Highest education level: Not on file  Occupational History  . Not on file  Social Needs  . Financial resource strain: Not on file  . Food insecurity:    Worry: Not on file    Inability: Not on file  . Transportation needs:    Medical: Not on  file    Non-medical: Not on file  Tobacco Use  . Smoking status: Former Research scientist (life sciences)  . Smokeless tobacco: Never Used  Substance and Sexual Activity  . Alcohol use: Yes    Alcohol/week: 1.2 oz    Types: 2 Cans of beer per week  . Drug use: No  . Sexual activity: Yes    Birth control/protection: Surgical  Lifestyle  . Physical activity:    Days per week: Not on file    Minutes per session: Not on file  . Stress: Not on file  Relationships  . Social connections:    Talks on phone: Not on file    Gets together: Not on file    Attends religious service: Not on file    Active member of club or organization: Not on file    Attends meetings of clubs or organizations: Not on file    Relationship status: Not on file  . Intimate partner violence:    Fear of current or ex partner: Not  on file    Emotionally abused: Not on file    Physically abused: Not on file    Forced sexual activity: Not on file  Other Topics Concern  . Not on file  Social History Narrative   ** Merged History Encounter **        Family History  Problem Relation Age of Onset  . Stroke Mother   . Breast cancer Sister     Past Surgical History:  Procedure Laterality Date  . ABDOMINAL HYSTERECTOMY    . BACK SURGERY  09/01/2010   L4-5 lumbar decompression including laminotomy, facetectomy,   . colonscopy    . FEMUR IM NAIL Left 09/05/2016  . FEMUR IM NAIL Left 09/05/2016   Procedure: INTRAMEDULLARY (IM) NAIL FEMORAL;  Surgeon: Nicholes Stairs, MD;  Location: [facility];  Service: Orthopedics;  Laterality: Left;  . FRACTURE SURGERY     left leg  . INSERTION OF MESH N/A 05/24/2013   Procedure: INSERTION OF MESH;  Surgeon: Imogene Burn. Georgette Dover, MD;  Location: [facility];  Service: General;  Laterality: N/A;  . UTERINE FIBROID SURGERY    . VENTRAL HERNIA REPAIR N/A 05/24/2013   Procedure: OPEN REPAIR EPIGASTRIC VENTRAL HERNIA;  Surgeon: Imogene Burn. Georgette Dover, MD;  Location: [facility];  Service: General;  Laterality: N/A;    ROS: Review of Systems Negative except as stated above. PHYSICAL EXAM: BP 138/80   Pulse (!) 52   Temp 98.1 F (36.7 C) (Oral)   Resp 16   Wt 178 lb 3.2 oz (80.8 kg)   SpO2 95%   BMI 30.59 kg/m   Repeat blood pressure 138/80. Physical Exam General appearance - alert, well appearing, and in no distress Mental status - alert, oriented to person, place, and time, normal mood, behavior, speech, dress, motor activity, and thought processes Chest - clear to auscultation, no wheezes, rales or rhonchi, symmetric air entry Heart - normal rate, regular rhythm, normal S1, S2, no murmurs, rubs, clicks or gallops Musculoskeletal -patient ambulates with a cane.  Left knee: Moderate joint enlargement and crepitus with passive range of motion.  Right knee: Mild joint enlargement with mild crepitus  on passive range of motion.  No point tenderness.   Extremities -no lower extremity edema.   ASSESSMENT AND PLAN: 1. Essential hypertension Close to goal of 130/80.  Refill sent on all blood pressure medications. - Comprehensive metabolic panel - CBC - Lipid panel - metoprolol succinate (TOPROL-XL) 100 MG 24 hr tablet; Take  1 tablet (100 mg total) by mouth daily. Take with or immediately following a meal.  Dispense: 90 tablet; Refill: 3 - hydrochlorothiazide (MICROZIDE) 12.5 MG capsule; Take 1 capsule (12.5 mg total) by mouth daily.  Dispense: 90 capsule; Refill: 3 - lisinopril (PRINIVIL,ZESTRIL) 40 MG tablet; Take 1 tablet (40 mg total) by mouth daily.  Dispense: 90 tablet; Refill: 3 - hydrALAZINE (APRESOLINE) 50 MG tablet; Take 1 tablet (50 mg total) by mouth 3 (three) times daily.  Dispense: 270 tablet; Refill: 3  2. Dyslipidemia - fenofibrate (TRICOR) 48 MG tablet; Take 1 tablet (48 mg total) by mouth daily.  Dispense: 90 tablet; Refill: 3  3. Alcohol use disorder, mild, abuse Encourage patient to cut back more.  She has set goal to decrease to one 8 ounce beer a day. 4. Hypermetropia of both eyes - Ambulatory referral to Ophthalmology  Patient was given the opportunity to ask questions.  Patient verbalized understanding of the plan and was able to repeat key elements of the plan.   Orders Placed This Encounter  Procedures  . Comprehensive metabolic panel  . CBC  . Lipid panel  . Ambulatory referral to Ophthalmology     Requested Prescriptions   Signed Prescriptions Disp Refills  . metoprolol succinate (TOPROL-XL) 100 MG 24 hr tablet 90 tablet 3    Sig: Take 1 tablet (100 mg total) by mouth daily. Take with or immediately following a meal.  . hydrochlorothiazide (MICROZIDE) 12.5 MG capsule 90 capsule 3    Sig: Take 1 capsule (12.5 mg total) by mouth daily.  Marland Kitchen lisinopril (PRINIVIL,ZESTRIL) 40 MG tablet 90 tablet 3    Sig: Take 1 tablet (40 mg total) by mouth daily.  .  hydrALAZINE (APRESOLINE) 50 MG tablet 270 tablet 3    Sig: Take 1 tablet (50 mg total) by mouth 3 (three) times daily.  . fenofibrate (TRICOR) 48 MG tablet 90 tablet 3    Sig: Take 1 tablet (48 mg total) by mouth daily.    Return in about 4 months (around 03/14/2018).  Karle Plumber, MD, FACP

## 2017-11-13 LAB — COMPREHENSIVE METABOLIC PANEL
ALK PHOS: 49 IU/L (ref 39–117)
ALT: 24 IU/L (ref 0–32)
AST: 24 IU/L (ref 0–40)
Albumin/Globulin Ratio: 1.6 (ref 1.2–2.2)
Albumin: 4.3 g/dL (ref 3.5–5.5)
BILIRUBIN TOTAL: 0.4 mg/dL (ref 0.0–1.2)
BUN / CREAT RATIO: 19 (ref 9–23)
BUN: 17 mg/dL (ref 6–24)
CHLORIDE: 107 mmol/L — AB (ref 96–106)
CO2: 23 mmol/L (ref 20–29)
CREATININE: 0.91 mg/dL (ref 0.57–1.00)
Calcium: 9.6 mg/dL (ref 8.7–10.2)
GFR calc Af Amer: 80 mL/min/{1.73_m2} (ref >59)
GFR calc non Af Amer: 69 mL/min/{1.73_m2} (ref >59)
GLUCOSE: 100 mg/dL — AB (ref 65–99)
Globulin, Total: 2.7 g/dL (ref 1.5–4.5)
Potassium: 3.8 mmol/L (ref 3.5–5.2)
Sodium: 146 mmol/L — ABNORMAL HIGH (ref 134–144)
Total Protein: 7 g/dL (ref 6.0–8.5)

## 2017-11-13 LAB — CBC
HEMATOCRIT: 41.9 % (ref 34.0–46.6)
HEMOGLOBIN: 13 g/dL (ref 11.1–15.9)
MCH: 23.5 pg — ABNORMAL LOW (ref 26.6–33.0)
MCHC: 31 g/dL — ABNORMAL LOW (ref 31.5–35.7)
MCV: 76 fL — ABNORMAL LOW (ref 79–97)
Platelets: 322 10*3/uL (ref 150–450)
RBC: 5.53 x10E6/uL — ABNORMAL HIGH (ref 3.77–5.28)
RDW: 16.5 % — AB (ref 12.3–15.4)
WBC: 6.1 10*3/uL (ref 3.4–10.8)

## 2017-11-13 LAB — LIPID PANEL
CHOL/HDL RATIO: 2.6 ratio (ref 0.0–4.4)
Cholesterol, Total: 153 mg/dL (ref 100–199)
HDL: 58 mg/dL (ref >39)
LDL Calculated: 80 mg/dL (ref 0–99)
TRIGLYCERIDES: 73 mg/dL (ref 0–149)
VLDL Cholesterol Cal: 15 mg/dL (ref 5–40)

## 2017-11-18 ENCOUNTER — Encounter: Payer: Self-pay | Admitting: Internal Medicine

## 2017-11-18 DIAGNOSIS — H35033 Hypertensive retinopathy, bilateral: Secondary | ICD-10-CM | POA: Diagnosis not present

## 2017-11-18 DIAGNOSIS — H2513 Age-related nuclear cataract, bilateral: Secondary | ICD-10-CM | POA: Diagnosis not present

## 2017-11-18 DIAGNOSIS — H1851 Endothelial corneal dystrophy: Secondary | ICD-10-CM | POA: Diagnosis not present

## 2017-11-19 ENCOUNTER — Telehealth: Payer: Self-pay

## 2017-11-19 NOTE — Telephone Encounter (Signed)
Contacted pt to go over lab results pt is aware of results and doesn't have any questions or concerns 

## 2017-11-25 DIAGNOSIS — M545 Low back pain: Secondary | ICD-10-CM | POA: Diagnosis not present

## 2017-11-25 DIAGNOSIS — M79605 Pain in left leg: Secondary | ICD-10-CM | POA: Diagnosis not present

## 2017-11-25 DIAGNOSIS — G8929 Other chronic pain: Secondary | ICD-10-CM | POA: Diagnosis not present

## 2017-11-25 DIAGNOSIS — M79604 Pain in right leg: Secondary | ICD-10-CM | POA: Diagnosis not present

## 2017-11-25 DIAGNOSIS — Z79899 Other long term (current) drug therapy: Secondary | ICD-10-CM | POA: Diagnosis not present

## 2017-12-12 DIAGNOSIS — H35039 Hypertensive retinopathy, unspecified eye: Secondary | ICD-10-CM

## 2017-12-12 DIAGNOSIS — H269 Unspecified cataract: Secondary | ICD-10-CM

## 2017-12-12 HISTORY — DX: Hypertensive retinopathy, unspecified eye: H35.039

## 2017-12-12 HISTORY — DX: Unspecified cataract: H26.9

## 2017-12-16 ENCOUNTER — Encounter: Payer: Self-pay | Admitting: Internal Medicine

## 2017-12-16 NOTE — Progress Notes (Signed)
Received note from [facility].  Patient seen 11/18/2017.  She was diagnosed with cataracts OU and hypertensive retinopathy OU grade 2.  Plan is to monitor her cataracts and hypertensive changes.  Updated prescription given for glasses.

## 2018-03-01 ENCOUNTER — Ambulatory Visit: Payer: Medicare Other | Admitting: Internal Medicine

## 2018-03-15 ENCOUNTER — Encounter: Payer: Self-pay | Admitting: Internal Medicine

## 2018-03-15 ENCOUNTER — Ambulatory Visit: Payer: Medicare Other | Attending: Internal Medicine | Admitting: Internal Medicine

## 2018-03-15 VITALS — BP 134/89 | HR 69 | Temp 98.3°F | Resp 16 | Ht 65.0 in | Wt 172.8 lb

## 2018-03-15 DIAGNOSIS — Z9889 Other specified postprocedural states: Secondary | ICD-10-CM | POA: Diagnosis not present

## 2018-03-15 DIAGNOSIS — Z7409 Other reduced mobility: Secondary | ICD-10-CM | POA: Insufficient documentation

## 2018-03-15 DIAGNOSIS — Z79899 Other long term (current) drug therapy: Secondary | ICD-10-CM | POA: Insufficient documentation

## 2018-03-15 DIAGNOSIS — Z87891 Personal history of nicotine dependence: Secondary | ICD-10-CM | POA: Diagnosis not present

## 2018-03-15 DIAGNOSIS — Z885 Allergy status to narcotic agent status: Secondary | ICD-10-CM | POA: Diagnosis not present

## 2018-03-15 DIAGNOSIS — I10 Essential (primary) hypertension: Secondary | ICD-10-CM | POA: Insufficient documentation

## 2018-03-15 DIAGNOSIS — Z791 Long term (current) use of non-steroidal anti-inflammatories (NSAID): Secondary | ICD-10-CM | POA: Diagnosis not present

## 2018-03-15 DIAGNOSIS — M545 Low back pain: Secondary | ICD-10-CM | POA: Insufficient documentation

## 2018-03-15 DIAGNOSIS — E785 Hyperlipidemia, unspecified: Secondary | ICD-10-CM | POA: Diagnosis not present

## 2018-03-15 DIAGNOSIS — M170 Bilateral primary osteoarthritis of knee: Secondary | ICD-10-CM | POA: Diagnosis not present

## 2018-03-15 MED ORDER — MELOXICAM 15 MG PO TABS: 15.0000 mg | ORAL_TABLET | Freq: Every day | ORAL | 1 refills | Status: DC

## 2018-03-15 NOTE — Progress Notes (Signed)
Pt states she is also having pain come from her back

## 2018-03-15 NOTE — Progress Notes (Signed)
Patient ID: Shannon [name], female    DOB: Mar 02, 1956  MRN: [id number]  CC: mobility exam   Subjective: Shannon [name] is a 62 y.o. female who presents for mobility eval for Hoveround chair Her concerns today include:  Patient with history of HTN, chronic lower back pain ( hx of L4-5 decompression surgery), HL,ETOH use disorder, OA knees.  Patient presents today for mobility examination to be considered for [facility] chair. Patient with history of arthritis in both knees.  Left is more bothersome than the right.  She used to see Dr. Stann Mainland her orthopedics.  In the summer he referred her to [facility] for pain management.  She was placed on Butrans patch which she did not find helpful.  She has not been back for follow-up.  Rates her pain as 5/10 most days.  Request RF on Mobic.  Has been using an OTC med called Body, Back and Pain which helps but the store has been out of it for several wks  She is requesting a Hoveround chair because of increased difficulty getting around in her two-bedroom apartment.  She currently gets to and from her appointments using medical transportation "To cook, clean the dishes or make up my bed, I have to sit down.  It just hurts so bad in my legs.  I stagger a lot too. I can barely make it to the front door."  She reports falling a few times, the last time was about 2 days ago at home.  She ambulates with a cane but feels that it does not help to break falls and knees still hurt when she walks.  She does not have a walker or manual wheelchair.  However she states that she has used a friend's walker in the past and feels that her arms wear out too quickly to use a walker or manual wheelchair.  She does not feel that she would be able to learn the use of a controlled stick with a mobility scooter.  She feels that with a Hoveround will increase her mobility in the home.  She would be able to do her activities of daily living in her apartment like  cooking, washing the dishes, some cleaning and getting on and off the commode with it.  Has shower bars and bath chair in her tub/shower. . Patient Active Problem List   Diagnosis Date Noted  . Obesity (BMI 30-39.9) 01/15/2017  . Alcohol use disorder, mild, abuse 01/15/2017  . Closed fracture of left distal femur ([place]) 09/03/2016  . Osteoarthritis of left knee 05/02/2015  . Postlaminectomy syndrome, lumbar region 11/09/2013  . Lumbosacral spondylosis without myelopathy 11/09/2013  . Dyslipidemia 10/11/2013  . Ventral hernia 05/19/2013  . DEPRESSION 06/13/2010  . DIVERTICULOSIS, COLON 07/21/2009  . SYPHILIS 05/14/2009  . DYSHIDROTIC ECZEMA, HANDS 11/06/2008  . Chronic low back pain 11/06/2008  . FIBROIDS, UTERUS 11/17/2007  . Essential hypertension 11/17/2007     Current Outpatient Medications on File Prior to Visit  Medication Sig Dispense Refill  . buprenorphine (BUTRANS) 20 MCG/HR PTWK patch Place 20 mcg onto the skin once a week.    . fenofibrate (TRICOR) 48 MG tablet Take 1 tablet (48 mg total) by mouth daily. 90 tablet 3  . fluticasone (FLONASE) 50 MCG/ACT nasal spray Place 2 sprays into both nostrils daily. 16 g 0  . hydrALAZINE (APRESOLINE) 50 MG tablet Take 1 tablet (50 mg total) by mouth 3 (three) times daily. 270 tablet 3  . hydrochlorothiazide (MICROZIDE) 12.5  MG capsule Take 1 capsule (12.5 mg total) by mouth daily. 90 capsule 3  . lisinopril (PRINIVIL,ZESTRIL) 40 MG tablet Take 1 tablet (40 mg total) by mouth daily. 90 tablet 3  . metoprolol succinate (TOPROL-XL) 100 MG 24 hr tablet Take 1 tablet (100 mg total) by mouth daily. Take with or immediately following a meal. 90 tablet 3   No current facility-administered medications on file prior to visit.     Allergies  Allergen Reactions  . Morphine And Related     Itch    Social History   Socioeconomic History  . Marital status: Legally Separated    Spouse name: Not on file  . Number of children: Not on file  .  Years of education: Not on file  . Highest education level: Not on file  Occupational History  . Not on file  Social Needs  . Financial resource strain: Not on file  . Food insecurity:    Worry: Not on file    Inability: Not on file  . Transportation needs:    Medical: Not on file    Non-medical: Not on file  Tobacco Use  . Smoking status: Former Research scientist (life sciences)  . Smokeless tobacco: Never Used  Substance and Sexual Activity  . Alcohol use: Yes    Alcohol/week: 2.0 standard drinks    Types: 2 Cans of beer per week  . Drug use: No  . Sexual activity: Yes    Birth control/protection: Surgical  Lifestyle  . Physical activity:    Days per week: Not on file    Minutes per session: Not on file  . Stress: Not on file  Relationships  . Social connections:    Talks on phone: Not on file    Gets together: Not on file    Attends religious service: Not on file    Active member of club or organization: Not on file    Attends meetings of clubs or organizations: Not on file    Relationship status: Not on file  . Intimate partner violence:    Fear of current or ex partner: Not on file    Emotionally abused: Not on file    Physically abused: Not on file    Forced sexual activity: Not on file  Other Topics Concern  . Not on file  Social History Narrative   ** Merged History Encounter **        Family History  Problem Relation Age of Onset  . Stroke Mother   . Breast cancer Sister     Past Surgical History:  Procedure Laterality Date  . ABDOMINAL HYSTERECTOMY    . BACK SURGERY  09/01/2010   L4-5 lumbar decompression including laminotomy, facetectomy,   . colonscopy    . FEMUR IM NAIL Left 09/05/2016  . FEMUR IM NAIL Left 09/05/2016   Procedure: INTRAMEDULLARY (IM) NAIL FEMORAL;  Surgeon: Nicholes Stairs, MD;  Location: [facility];  Service: Orthopedics;  Laterality: Left;  . FRACTURE SURGERY     left leg  . INSERTION OF MESH N/A 05/24/2013   Procedure: INSERTION OF MESH;  Surgeon:  Imogene Burn. Georgette Dover, MD;  Location: [facility];  Service: General;  Laterality: N/A;  . UTERINE FIBROID SURGERY    . VENTRAL HERNIA REPAIR N/A 05/24/2013   Procedure: OPEN REPAIR EPIGASTRIC VENTRAL HERNIA;  Surgeon: Imogene Burn. Georgette Dover, MD;  Location: [facility];  Service: General;  Laterality: N/A;    ROS: Review of Systems Neg except as above  PHYSICAL EXAM: BP  134/89   Pulse 69   Temp 98.3 F (36.8 C) (Oral)   Resp 16   Ht 5\' 5"  (1.651 m)   Wt 172 lb 12.8 oz (78.4 kg)   SpO2 96%   BMI 28.76 kg/m   Physical Exam  General appearance - alert, well appearing, and in no distress Mental status - normal mood, behavior, speech, dress, motor activity, and thought processes Chest: Clear to auscultation bilaterally. CVS: Regular rate and rhythm.  No gallops or murmurs heard. Neurological -power: Grip- 5/5 right, 4+/5 left.  Power in the upper extremities 5/5 proximally and distally. Power hip flexors and extenders 4/5 bilaterally.  Distal lower extremities 4+/5 bilaterally.  Gross sensation intact in extremities Gait wide-based with a low foot to floor clearance.  She favors the left knee.  Ambulates with a cane Musculoskeletal -left knee: Moderate joint enlargement.  Moderate crepitus and discomfort with passive range of motion.  Right knee: Mild joint enlargement.  No crepitus on palpation.  Good passive range of motion. Ext:  No LE edema  ASSESSMENT AND PLAN: 1. Limited mobility 2. Osteoarthritis of both knees, unspecified osteoarthritis type -Patient with limited mobility due to advanced arthritis in the knees particularly the left knee.  She ambulates with a cane but has had falls even with use of the cane in the home.  ADLs that are affected by her limited mobility include meal prep, housekeeping and toileting.  Use of a cane would not suffice as patient has a gait disturbance and has had falls even with use of the cane.  Patient reports that use of a walker or manual wheelchair will not suffice as  she feels her arms give out to easily.  However she has good strength of upper extremities on exam today.  I have recommended referral to physical therapy for a second opinion and mobility evaluation.  In the meantime I recommend trial of a Rollator walker and patient is agreeable to doing so. Refill given on meloxicam. I recommend getting back in with the pain specialist for pain management but patient states that she is not wanting to do that at this time. - Ambulatory referral to Physical Therapy  - meloxicam (MOBIC) 15 MG tablet; Take 1 tablet (15 mg total) by mouth daily.  Dispense: 90 tablet; Refill: 1    Patient was given the opportunity to ask questions.  Patient verbalized understanding of the plan and was able to repeat key elements of the plan.   Orders Placed This Encounter  Procedures  . Ambulatory referral to Physical Therapy     Requested Prescriptions   Signed Prescriptions Disp Refills  . meloxicam (MOBIC) 15 MG tablet 90 tablet 1    Sig: Take 1 tablet (15 mg total) by mouth daily.    Return in about 6 weeks (around 04/26/2018).  Karle Plumber, MD, FACP

## 2018-03-15 NOTE — Patient Instructions (Signed)
I have referred you to physical therapy for second opinion on the mobility evaluation.  I have given a prescription for a Rollator walker.  You can take at any medical supply store.  Please bring the over-the-counter pain medication that you are taking on your next visit.

## 2018-03-17 ENCOUNTER — Ambulatory Visit: Payer: Medicare Other | Admitting: Internal Medicine

## 2018-04-01 ENCOUNTER — Ambulatory Visit: Payer: Medicare Other | Attending: Internal Medicine | Admitting: Physical Therapy

## 2018-04-01 ENCOUNTER — Encounter: Payer: Self-pay | Admitting: Physical Therapy

## 2018-04-01 DIAGNOSIS — R2689 Other abnormalities of gait and mobility: Secondary | ICD-10-CM

## 2018-04-01 DIAGNOSIS — R2681 Unsteadiness on feet: Secondary | ICD-10-CM

## 2018-04-01 DIAGNOSIS — M6281 Muscle weakness (generalized): Secondary | ICD-10-CM | POA: Diagnosis not present

## 2018-04-03 NOTE — Therapy (Signed)
[facility] [address] [place], [place], [address] Phone: [phone]   Fax:  [phone]  Physical Therapy Evaluation  Patient Details  Name: Shannon [name] MRN: [id number] Date of Birth: June 02, 1955 Referring Provider (PT): Karle Plumber   Encounter Date: 04/01/2018  PT End of Session - 04/03/18 2153    Visit Number  1    Number of Visits  2    Date for PT Re-Evaluation  05/30/18    Authorization Type  Medicare/Medicaid    PT Start Time  [phone]    PT Stop Time  1020    PT Time Calculation (min)  42 min    Equipment Utilized During Treatment  Gait belt    Activity Tolerance  Patient limited by pain   Takes multiple seated rest breaks   Behavior During Therapy  [facility] for tasks assessed/performed       Past Medical History:  Diagnosis Date  . Cataract 12/2017   Dr. Schuyler Amor  . Dizziness    occasionally  . Headache   . Hyperlipidemia    takes Tricor daily  . Hypertension    takes Lisinopril and Metoprolol daily  . Hypertensive retinopathy 12/2017   Dr. Schuyler Amor  . Numbness    stinging in right hand  . Peripheral edema    takes lasix daily  . Ventral hernia     Past Surgical History:  Procedure Laterality Date  . ABDOMINAL HYSTERECTOMY    . BACK SURGERY  09/01/2010   L4-5 lumbar decompression including laminotomy, facetectomy,   . colonscopy    . FEMUR IM NAIL Left 09/05/2016  . FEMUR IM NAIL Left 09/05/2016   Procedure: INTRAMEDULLARY (IM) NAIL FEMORAL;  Surgeon: Nicholes Stairs, MD;  Location: [facility];  Service: Orthopedics;  Laterality: Left;  . FRACTURE SURGERY     left leg  . INSERTION OF MESH N/A 05/24/2013   Procedure: INSERTION OF MESH;  Surgeon: Imogene Burn. Georgette Dover, MD;  Location: [facility];  Service: General;  Laterality: N/A;  . UTERINE FIBROID SURGERY    . VENTRAL HERNIA REPAIR N/A 05/24/2013   Procedure: OPEN REPAIR EPIGASTRIC VENTRAL HERNIA;  Surgeon: Imogene Burn. Georgette Dover, MD;  Location: [facility];   Service: General;  Laterality: N/A;    There were no vitals filed for this visit.   Subjective Assessment - 04/03/18 2144    Subjective  Having a lot of pain with my legs from arthritis and my back.  Had a back surgery, but still have some pain.  Limits standing and cooking.  Pain has been limiting mobility for about one year.  Uses a cane.  Has had some falls, due to legs getting weak.    Limitations  Standing;Walking;House hold activities    How long can you stand comfortably?  2 minutes-needs to sit    Patient Stated Goals  Pt's goals for therapy to get mobility chair.    Currently in Pain?  Yes    Pain Score  8     Pain Location  Knee    Pain Orientation  Right;Left    Pain Descriptors / Indicators  Aching   arthritis-type pain   Pain Onset  More than a month ago    Pain Frequency  Constant    Aggravating Factors   sleeping, just hurts all the time    Pain Relieving Factors  walking sometimes, sitting    Multiple Pain Sites  Yes    Pain Score  8  Pain Location  Back    Pain Orientation  Right;Lower    Pain Descriptors / Indicators  Sharp    Pain Type  Chronic pain    Pain Onset  More than a month ago    Pain Frequency  Intermittent    Aggravating Factors   cleaning, walking    Pain Relieving Factors  sitting    Effect of Pain on Daily Activities  Discussed trying to address pain through therapy, and pt declines further therapy other then completion of mobility evaluation/assessment for power mobility         [facility] PT Assessment - 04/03/18 2149      Assessment   Medical Diagnosis  limited mobility    Referring Provider (PT)  Karle Plumber    Onset Date/Surgical Date  03/15/18   referral     Precautions   Precautions  Fall      Balance Screen   Has the patient fallen in the past 6 months  Yes    How many times?  3    Has the patient had a decrease in activity level because of a fear of falling?   Yes    Is the patient reluctant to leave their home because of  a fear of falling?   Yes      [place] residence    Living Arrangements  --   Friend   Available Help at Discharge  Friend(s)    Type of Home  Apartment    Home Access  Level entry    [place] - single point;Grab bars - tub/shower   Has order for RW, but has not gotten     Prior Function   Level of Independence  Independent   takes a longer time for ADLs, IADLS   Leisure  Enjoys cooking, yardwork      Observation/Other Assessments   Focus on Therapeutic Outcomes (FOTO)   NA      Posture/Postural Control   Posture/Postural Control  Postural limitations    Postural Limitations  Rounded Shoulders      Strength   Overall Strength  Deficits    Overall Strength Comments  Holds contraction for several seconds, then breaks    Right Hip Flexion  3/5    Left Hip Flexion  3/5    Right Knee Flexion  3+/5    Right Knee Extension  3+/5    Left Knee Flexion  3+/5    Left Knee Extension  3+/5    Right Ankle Dorsiflexion  3-/5    Left Ankle Dorsiflexion  3-/5      Transfers   Transfers  Sit to Stand;Stand to Sit    Sit to Stand  6: Modified independent (Device/Increase time);With upper extremity assist;From chair/3-in-1    Stand to Sit  6: Modified independent (Device/Increase time);With upper extremity assist;To chair/3-in-1      Ambulation/Gait   Ambulation/Gait  Yes    Ambulation/Gait Assistance  6: Modified independent (Device/Increase time)    Ambulation Distance (Feet)  80 Feet    Assistive device  Straight cane    Gait Pattern  Step-through pattern;Decreased step length - right;Decreased step length - left;Antalgic    Ambulation Surface  Level;Indoor    Gait velocity  35 sec= 0.94 ft/sec      Standardized Balance Assessment   Standardized Balance Assessment  Berg Balance Test;Timed Up and Go Test  Berg Balance Test   Sit to Stand  Able to stand  independently using hands    Standing  Unsupported  Needs several tries to stand 30 seconds unsupported   27 sec, then 47 sec   Sitting with Back Unsupported but Feet Supported on Floor or Stool  Able to sit safely and securely 2 minutes    Stand to Sit  Controls descent by using hands    Transfers  Able to transfer safely, definite need of hands    Standing Unsupported with Eyes Closed  Able to stand 10 seconds with supervision    Standing Ubsupported with Feet Together  Able to place feet together independently but unable to hold for 30 seconds   12.5 sec increased lateral trunk sway   From Standing, Reach Forward with Outstretched Arm  Can reach forward >12 cm safely (5")    From Standing Position, Pick up Object from Floor  Unable to try/needs assist to keep balance    From Standing Position, Turn to Look Behind Over each Shoulder  Turn sideways only but maintains balance    Turn 360 Degrees  Able to turn 360 degrees safely but slowly    Standing Unsupported, Alternately Place Feet on Step/Stool  Needs assistance to keep from falling or unable to try    Standing Unsupported, One Foot in [employer] balance while stepping or standing    Standing on One Leg  Unable to try or needs assist to prevent fall    Total Score  26    Berg comment:  Scores <45/56 indicate increased fall risk.  Pt requires multiple seated rest breaks during Berg.      Timed Up and Go Test   Normal TUG (seconds)  27.68    TUG Comments  Scores >13.5 seconds indicate increased fall risk.                Objective measurements completed on examination: See above findings.                   PT Long Term Goals - 04/03/18 2200      PT LONG TERM GOAL #1   Title  Pt will complete wheelchair assessment/recommendations for power mobility options for mobility in the home.    Time  2    Period  Weeks    Status  New    Target Date  05/02/18      PT LONG TERM GOAL #2   Title  Pt will verbalize understanding of fall prevention in home  environment.    Time  2    Period  Weeks    Status  New    Target Date  05/02/18             Plan - 04/03/18 2154    Clinical Impression Statement  Pt is a 62 year old female who presents to [facility] with diagnosis of limited mobility, with history of knee pain, back pain, and history of lumbar surgery in the past.  She presents to [facility] with desire for power mobility for improved safety and mobility her home. She describes limitations with her mobility, which limit standing household and gait activities; she has a history of 3 falls in the past 6 months.  She demonstrates decreased strength, decreased balance, decreased gait, with pt being at fall risk per Berg, TUG, and gait velocity scores.  She is currently using a cane for mobility at home.  She may benefit from further  assessment with wheelchair vendor present for further assessment for power mobility needs.    History and Personal Factors relevant to plan of care:  PMH>3 co-morbidities, hx of 3 falls in the past 6 months    Clinical Presentation  Stable    Clinical Presentation due to:  fall risk per Merrilee Jansky, TUG, gait velocity scores    Clinical Decision Making  Low    Rehab Potential  Good    PT Frequency  Other (comment)   1 additional visit following eval   PT Treatment/Interventions  Wheelchair mobility training;ADLs/Self Care Home Management    PT Next Visit Plan  Schedule next appointment with wheelchair vendor present to complete assessment/recommendations for power mobility    Consulted and Agree with Plan of Care  Patient       Patient will benefit from skilled therapeutic intervention in order to improve the following deficits and impairments:  Abnormal gait, Decreased balance, Decreased activity tolerance, Decreased mobility, Difficulty walking, Decreased strength  Visit Diagnosis: Other abnormalities of gait and mobility  Unsteadiness on feet  Muscle weakness (generalized)     Problem List Patient Active Problem  List   Diagnosis Date Noted  . Obesity (BMI 30-39.9) 01/15/2017  . Alcohol use disorder, mild, abuse 01/15/2017  . Closed fracture of left distal femur ([place]) 09/03/2016  . Osteoarthritis of left knee 05/02/2015  . Postlaminectomy syndrome, lumbar region 11/09/2013  . Lumbosacral spondylosis without myelopathy 11/09/2013  . Dyslipidemia 10/11/2013  . Ventral hernia 05/19/2013  . DEPRESSION 06/13/2010  . DIVERTICULOSIS, COLON 07/21/2009  . SYPHILIS 05/14/2009  . DYSHIDROTIC ECZEMA, HANDS 11/06/2008  . Chronic low back pain 11/06/2008  . FIBROIDS, UTERUS 11/17/2007  . Essential hypertension 11/17/2007    Frazier Butt. 04/03/2018, 10:02 PM  Frazier Butt., PT   [facility] [address] [place], [place], [address] Phone: [phone]   Fax:  [phone]  Name: Shannon [name] MRN: [id number] Date of Birth: 1956-02-10

## 2018-04-21 ENCOUNTER — Other Ambulatory Visit: Payer: Self-pay | Admitting: Internal Medicine

## 2018-04-21 DIAGNOSIS — Z1231 Encounter for screening mammogram for malignant neoplasm of breast: Secondary | ICD-10-CM

## 2018-05-02 ENCOUNTER — Ambulatory Visit: Payer: Medicare Other | Attending: Internal Medicine | Admitting: Physical Therapy

## 2018-05-02 DIAGNOSIS — M6281 Muscle weakness (generalized): Secondary | ICD-10-CM | POA: Diagnosis not present

## 2018-05-02 DIAGNOSIS — R2689 Other abnormalities of gait and mobility: Secondary | ICD-10-CM | POA: Insufficient documentation

## 2018-05-02 DIAGNOSIS — R2681 Unsteadiness on feet: Secondary | ICD-10-CM | POA: Diagnosis not present

## 2018-05-02 NOTE — Therapy (Signed)
[facility] [address] [place], [place], [address] Phone: [phone]   Fax:  [phone]  Physical Therapy Treatment  Patient Details  Name: Shannon [name] MRN: [id number] Date of Birth: 08-02-1955 Referring Provider (PT): Karle Plumber   Encounter Date: 05/02/2018  PT End of Session - 05/02/18 1553    Visit Number  2    Number of Visits  2    Date for PT Re-Evaluation  05/30/18    Authorization Type  Medicare/Medicaid    PT Start Time  1105    PT Stop Time  1145    PT Time Calculation (min)  40 min    Equipment Utilized During Treatment  Gait belt    Activity Tolerance  Patient limited by pain    Behavior During Therapy  [facility] for tasks assessed/performed       Past Medical History:  Diagnosis Date  . Cataract 12/2017   Dr. Schuyler Amor  . Dizziness    occasionally  . Headache   . Hyperlipidemia    takes Tricor daily  . Hypertension    takes Lisinopril and Metoprolol daily  . Hypertensive retinopathy 12/2017   Dr. Schuyler Amor  . Numbness    stinging in right hand  . Peripheral edema    takes lasix daily  . Ventral hernia     Past Surgical History:  Procedure Laterality Date  . ABDOMINAL HYSTERECTOMY    . BACK SURGERY  09/01/2010   L4-5 lumbar decompression including laminotomy, facetectomy,   . colonscopy    . FEMUR IM NAIL Left 09/05/2016  . FEMUR IM NAIL Left 09/05/2016   Procedure: INTRAMEDULLARY (IM) NAIL FEMORAL;  Surgeon: Nicholes Stairs, MD;  Location: [facility];  Service: Orthopedics;  Laterality: Left;  . FRACTURE SURGERY     left leg  . INSERTION OF MESH N/A 05/24/2013   Procedure: INSERTION OF MESH;  Surgeon: Imogene Burn. Georgette Dover, MD;  Location: [facility];  Service: General;  Laterality: N/A;  . UTERINE FIBROID SURGERY    . VENTRAL HERNIA REPAIR N/A 05/24/2013   Procedure: OPEN REPAIR EPIGASTRIC VENTRAL HERNIA;  Surgeon: Imogene Burn. Georgette Dover, MD;  Location: [facility];  Service: General;  Laterality: N/A;     There were no vitals filed for this visit.  Subjective Assessment - 05/02/18 1553    Subjective  Reports having one fall since PT eval in December.    Limitations  Standing;Walking;House hold activities    How long can you stand comfortably?  2 minutes-needs to sit    Patient Stated Goals  Pt's goals for therapy to get mobility chair.    Currently in Pain?  Yes   See pain ratings on w/c assessment   Pain Onset  More than a month ago    Pain Onset  More than a month ago          PATIENT INFORMATION: Name: Shannon [name] DOB: [birth year]  Sex: Female Date seen: 05/02/2018 Time: 1100  Address:  [place] [address] Physician: Karle Plumber This evaluation/justification form will serve as the LMN for the following suppliers: __________________________ Supplier: [employer] [facility] [employer] Contact Person: Felton Clinton Phone:  [phone]   Seating Therapist: Mady Haagensen, PT Phone:   [phone]   Phone: [phone]     Spouse/Parent/Caregiver name: Legrand Rams  Phone number: [phone] Insurance/Payer: Medicare, Medicaid     Reason for Referral: power mobility referral  Patient/Caregiver Goals: To be able to get around home easier  Patient was seen for face-to-face evaluation for new power wheelchair.  Also present was [employer] to discuss recommendations and wheelchair options.  Further paperwork was completed and sent to vendor.  Patient appears to qualify for power mobility device at this time per objective findings.   MEDICAL HISTORY: Diagnosis: Primary Diagnosis: chronic low back pain Onset: 2012 Diagnosis: OA bilateral knees   _0 Progressive Disease Relevant past and future surgeries: lumbar surgery   Height: 5'5" Weight: 172 Explain recent changes or trends in weight: ?????   History including Falls: PMH includes HTN, OA bilateral knees, lumbary surgery and chronic low back pain.  Pt reports at least 5 falls in the past 6 months.  Reports legs  give way.  Requires assistance for getting up from floor.    HOME ENVIRONMENT: _1 House  _2 Condo/town home  _3 Apartment  _4 Assisted Living    _5 Lives Alone _6  Lives with Others                                                                                          Hours with caregiver: ?????  _7 Home is accessible to patient           Stairs      _8 Yes _9  No     Ramp _10 Yes _11 No Comments:  Ground floor apartment; level entry into apartment   COMMUNITY ADL: TRANSPORTATION: _12 Car    _13 Van    <VOJJKKXFGHWEXHBZ>_1<\/IRCVELFYBOFBPZWC>_58 Public Transportation    _15 Adapted w/c Lift    _16 Ambulance    _17 Other:       _18 Sits in wheelchair during transport  Employment/School: NA Specific requirements pertaining to mobility ?????  Other: ?????    FUNCTIONAL/SENSORY PROCESSING SKILLS:  Handedness:   _19 Right     _20 Left    _21 NA  Comments:  ?????  Functional Processing Skills for Wheeled Mobility _22 Processing Skills are adequate for safe wheelchair operation  Areas of concern than may interfere with safe operation of wheelchair Description of problem   _23  Attention to environment      _24 Judgment      _25  Hearing  _26  Vision or visual processing      _27 Motor Planning  _28  Fluctuations in Behavior  ?????    VERBAL COMMUNICATION: _29 WFL receptive _30  WFL expressive _31 Understandable  _32 Difficult to understand  _33 non-communicative _34  Uses an augmented communication device  CURRENT SEATING / MOBILITY: Current Mobility Base:  _35 None _36 Dependent _37 Manual _38 Scooter _39 Power  Type of Control: ?????  Manufacturer:  ?????Size:  ?????Age: ?????  Current Condition of Mobility Base:  ?????   Current Wheelchair components:  ?????  Describe posture in present seating system:  ?????      SENSATION and SKIN ISSUES: Sensation _40 Intact  _41 Impaired _42 Absent  Level of sensation: c/o numbness, tingling at times in hands Pressure Relief: Able to perform effective pressure relief :    _43 Yes  _44  No Method: Change of positions If not, Why?: ?????  Skin  Issues/Skin Integrity Current Skin Issues  _45 Yes _46 No _47 Intact _48  Red area_49  Open Area  _50 Scar Tissue _51 At risk from prolonged sitting Where  ?????  History of Skin Issues  _52 Yes _53 No Where  ????? When  ?????  Hx of skin flap surgeries  _0 Yes _1 No Where  ????? When  ?????  Limited sitting tolerance _2 Yes _3 No Hours spent sitting in wheelchair daily: ?????  Complaint of Pain:  Please describe: Pt rates aching leg pain bilaterally, 6/10.  Aggravated by standing long periods, walking.  Pain not alleviated by anything.  Back pain 7/10 described as sharp, aggravated by standing, walking.  Back pain relieved by position changes and lying down.   Swelling/Edema: NA   ADL STATUS (in reference to wheelchair use):  Indep Assist Unable Indep with Equip Not assessed Comments  Dressing X ????? ????? ????? ????? Sits on bed for dressing  Eating X ????? ????? ????? ????? ?????  Toileting X ????? ????? ????? ????? ?????  Bathing ????? ????? ????? X ????? Shower seat  Grooming/Hygiene ????? ????? ????? X ????? Performs in sitting  Meal Prep ????? ????? ????? X ????? Performs in sitting  IADLS ????? ????? ????? ????? X ?????  Bowel Management: _4 Continent  _5 Incontinent  _6 Accidents Comments:  ?????  Bladder Management: _7 Continent  _8 Incontinent  _9 Accidents Comments:  Not able to get to bathroom in time, mostly at night     WHEELCHAIR SKILLS: Manual w/c Propulsion: _10 UE or LE strength and endurance sufficient to participate in ADLs using manual wheelchair Arm : _11 left _12 right   _13 Both      Distance: ????? Foot:  _14 left _15 right   _16 Both  Operate Scooter: _17  Strength, hand grip, balance and transfer appropriate for use _18 Living environment is accessible for use of scooter  Operate Power w/c:  _19  Std. Joystick   _20  Alternative Controls Indep _21  Assist _22  Dependent/unable _23  N/A _24   _25 Safe          _26  Functional      Distance: ?????  Bed confined without wheelchair _27  Yes _28  No    STRENGTH/RANGE OF MOTION:  ????? Range of Motion Strength  Shoulder WFL shoulder flexion 4/5 bilateral shoulder flexion  Elbow WFL 4/5 bilateral elbow flexion and extension  Wrist/Hand WFL 4/5 bilateral wrist extension, flexion  Hip WFL 3/5 bilateral hip flexion  Knee WFL; full knee extension LLE elicits leg pain 3+/5 bilateral knee flexion, knee extension  Ankle WFL 3-/5 bilateral ankle dorsiflexion     MOBILITY/BALANCE:  _29  Patient is totally dependent for mobility  ?????    Balance Transfers Ambulation  Sitting Balance: Standing Balance: _30  Independent _31  Independent/Modified Independent  _32  WFL     _33  WFL _34  Supervision _35  Supervision  _36  Uses UE for balance  _37  Supervision _38  Min Assist _39  Ambulates with Assist  ?????    _40  Min Assist _41  Min assist _42  Mod Assist _43  Ambulates with Device:      _44  RW  _45  StW  _46  Cane  _47  ?????  _48  Mod Assist _49  Mod assist _50  Max assist   _51  Max Assist _52  Max assist _53  Dependent _54  Indep. Short Distance Only  _55  Unable _56  Unable _57  Lift / Sling Required Distance (in feet)  62 ft   _58  Sliding board _59  Unable to Ambulate (see explanation below)  Cardio Status:  _60 Intact  _61  Impaired   _62  NA     HTN  Respiratory Status:  _63 Intact   _64 Impaired   _65 NA     ?????  Orthotics/Prosthetics: ?????  Comments (Address manual vs power w/c vs scooter): Pt is at fall risk based on Berg score of 26/56, TUG 27.68 sec and gait velocity score of 0.94 ft/sec.  Manual wheelchair not viable option due to  back pain, endurance.  Scooter not option due to steering tiller likely exacerbating back pain.          Anterior / Posterior Obliquity Rotation-Pelvis ?????  PELVIS    _0  _1  _2   Neutral Posterior Anterior  _3  _4  _5   WFL Rt elev Lt elev  _6  _7  _8   WFL Right Left                      Anterior    Anterior     _9  Fixed _10  Other _11  Partly Flexible _12  Flexible   _13  Fixed _14  Other _15  Partly Flexible  _16  Flexible  _17  Fixed _18  Other _19  Partly  Flexible  _20  Flexible   TRUNK  _21  _22  _23   WFL ? Thoracic ? Lumbar  Kyphosis Lordosis  _24  _25  _26   WFL Convex Convex  Right Left _27 c-curve _28 s-curve _29 multiple  _30  Neutral _31  Left-anterior _32  Right-anterior     _33  Fixed _34  Flexible _35  Partly Flexible _36  Other  _37  Fixed _38  Flexible _39  Partly Flexible _40  Other  _41  Fixed             _42  Flexible _43  Partly Flexible _44  Other    Position Windswept  ?????  HIPS          _45            _46               _47    Neutral       Abduct        ADduct         _48           _49            _50   Neutral Right           Left      _51  Fixed _52  Subluxed _53  Partly Flexible _54  Dislocated _55  Flexible  _56  Fixed _57  Other _58  Partly Flexible  _59  Flexible                 Foot Positioning Knee Positioning  ?????    _60  WFL  _61 Lt _62 Rt _63  WFL  _64 Lt _65 Rt    KNEES ROM concerns: ROM concerns:    & Dorsi-Flexed _66 Lt _67 Rt holds L knee slightly in abduction/external rotaiton    FEET Plantar Flexed _68 Lt _69 Rt      Inversion                 _70 Lt _71 Rt      Eversion                 _72 Lt _73 Rt     HEAD _74  Functional _75  Good Head Control  ?????  & _76  Flexed         _77  Extended _78  Adequate Head Control    NECK _79  Rotated  Lt  _80  Lat Flexed Lt _81  Rotated  Rt _82  Lat Flexed Rt _83  Limited Head Control     _84  Cervical Hyperextension _85  Absent  Head Control     SHOULDERS ELBOWS WRIST& HAND ?????      Left     Right    Left     Right    Left     Right   U/E _86 Functional           _87 Functional ????? ????? _88 Fisting             _89 Fisting      _90 elev   _91 dep      _92   elev   _0 dep       _1 pro -_2 retract     _3 pro  _4 retract _5 subluxed             _6 subluxed           Goals for Wheelchair Mobility  _7  Independence with mobility in the home with motor related ADLs (MRADLs)  _8  Independence with MRADLs in the community _9  Provide dependent mobility  _10  Provide recline     _11 Provide tilt   Goals for Seating system _12  Optimize pressure distribution _13  Provide support  needed to facilitate function or safety _14  Provide corrective forces to assist with maintaining or improving posture _15  Accommodate client's posture:   current seated postures and positions are not flexible or will not tolerate corrective forces _16  Client to be independent with relieving pressure in the wheelchair _17 Enhance physiological function such as breathing, swallowing, digestion  Simulation ideas/Equipment trials:????? State why other equipment was unsuccessful:?????   MOBILITY BASE RECOMMENDATIONS and JUSTIFICATION: MOBILITY COMPONENT JUSTIFICATION  Manufacturer: JazzyModel: Elite ES   Size: Width 16Seat Depth 18 _18 provide transport from point A to B      _19 promote Indep mobility  _20 is not a safe, functional ambulator _21 walker or cane inadequate _22 non-standard width/depth necessary to accommodate anatomical measurement _23  ?????  _24 Manual Mobility Base _25 non-functional ambulator    _26 Scooter/POV  _27 can safely operate  _28 can safely transfer   _29 has adequate trunk stability  _30 cannot functionally propel manual w/c  _31 Power Mobility Base  _32 non-ambulatory  _33 cannot functionally propel manual wheelchair  _34  cannot functionally and safely operate scooter/POV _35 can safely operate and willing to  _36 Stroller Base _37 infant/child  _38 unable to propel manual wheelchair _39 allows for growth _40 non-functional ambulator _41 non-functional UE _42 Indep mobility is not a goal at this time  _43 Tilt  _44 Forward _45 Backward _46 Powered tilt  _47 Manual tilt  _48 change position against gravitational force on head and shoulders  _49 change position for pressure relief/cannot weight shift _50 transfers  _51 management of tone _52 rest periods _53 control edema _54 facilitate postural control  _55  ?????  _56 Recline  _57 Power recline on power base _58 Manual recline on manual base  _59 accommodate femur to back angle  _60 bring to full recline for ADL care  _61 change position for pressure relief/cannot weight shift  _62 rest periods _63 repositioning for transfers or clothing/diaper /catheter changes _64 head positioning  _65 Lighter weight required _66 self- propulsion  _67 lifting _68  ?????  _69 Heavy Duty required _70 user weight greater than 250# _71 extreme tone/ over active movement _72 broken frame on previous chair _73  ?????  _74  Back  _75  Angle Adjustable _76  Custom molded ????? _77 postural control _78 control of tone/spasticity _79 accommodation of range of motion _80 UE functional control _81 accommodation for seating system _82  ????? _83 provide lateral trunk support _84 accommodate deformity _85 provide posterior trunk support _86 provide lumbar/sacral support _87 support trunk in midline _88 Pressure relief over spinal processes  _89  Seat Cushion ????? _90 impaired sensation  _91 decubitus ulcers present _92 history of pressure ulceration _93 prevent pelvic extension _94 low maintenance  _95 stabilize pelvis  _96 accommodate obliquity _97 accommodate multiple deformity _98 neutralize lower extremity position _99 increase pressure distribution _100  ?????  _101  Pelvic/thigh support  _102  Lateral thigh guide _103  Distal medial pad  _104  Distal lateral pad _105  pelvis in neutral _106 accommodate pelvis _107  position upper legs _108  alignment _109  accommodate ROM _110  decr adduction _111 accommodate tone _112 removable for transfers _113 decr abduction  _114  Lateral trunk Supports _115  Lt     _116  Rt _117 decrease lateral trunk leaning _118 control tone _119 contour for increased contact _120 safety  _121 accommodate asymmetry _122  ?????  _123  Mounting hardware  _124 lateral trunk supports  _125 back   _126 seat _127 headrest      _128   thigh support _0 fixed   _1 swing away _2 attach seat platform/cushion to w/c frame _3 attach back cushion to w/c frame _4 mount postural supports _5 mount headrest  _6 swing medial thigh support away _7 swing lateral supports away for transfers  _8  ?????    Armrests  _9 fixed _10 adjustable height _11 removable   _12 swing away  _13 flip back   _14 reclining _15 full length pads  _16 desk    _17 pads tubular  _18 provide support with elbow at 90   _19 provide support for w/c tray _20 change of height/angles for variable activities _21 remove for transfers _22 allow to come closer to table top _23 remove for access to tables _24  ?????  Hangers/ Leg rests  _25 60 _26 70 _27 90 _28 elevating _29 heavy duty  _30 articulating _31 fixed _32 lift off _33 swing away     _34 power _35 provide LE support  _36 accommodate to hamstring tightness _37 elevate legs during recline   _38 provide change in position for Legs _39 Maintain placement of feet on footplate _40 durability _41 enable transfers _42 decrease edema _43 Accommodate lower leg length _44  ?????  Foot support Footplate    <EVOJJKKXFGHWEXHB>_7<\/JIRCVELFYBOFBPZW>_25 Lt  _46  Rt  _47  Center mount _48 flip up     _49 depth/angle adjustable _50 Amputee adapter    _51  Lt     _52  Rt _53 provide foot support _54 accommodate to ankle ROM _55 transfers _56 Provide support for residual extremity _57  allow foot to go under wheelchair base _58  decrease tone  _59  ?????  _60  Ankle strap/heel loops _61 support foot on foot support _62 decrease extraneous movement _63 provide input to heel  _64 protect foot  Tires: _65 pneumatic  _66 flat free inserts  _67 solid  _68 decrease maintenance  _69 prevent frequent flats _70 increase shock absorbency _71 decrease pain from road shock _72 decrease spasms from road shock _73  ?????  _74  Headrest  _75 provide posterior head support _76 provide posterior neck support _77 provide lateral head support _78 provide anterior head support _79 support during tilt and recline _80 improve feeding   _81 improve respiration _82 placement of switches _83 safety  _84 accommodate ROM  _85 accommodate tone _86 improve visual orientation  _87  Anterior chest strap _88  Vest _89  Shoulder retractors  _90 decrease forward movement of shoulder _91 accommodation of TLSO _92 decrease forward movement of trunk _93 decrease shoulder elevation _94 added abdominal support _95 alignment _96 assistance with shoulder control  _97  ?????  Pelvic  Positioner _98 Belt _99 SubASIS bar _100 Dual Pull _101 stabilize tone _102 decrease falling out of chair/ **will not Decr potential for sliding due to pelvic tilting _103 prevent excessive rotation _104 pad for protection over boney prominence _105 prominence comfort _106 special pull angle to control rotation _107  ?????  Upper Extremity Support _108 L   _109  R _110 Arm trough    _111 hand support _112  tray       _113 full tray _114 swivel mount _115 decrease edema      _116 decrease subluxation   _117 control tone   _118 placement for AAC/Computer/EADL _119 decrease gravitational pull on shoulders _120 provide midline positioning _121 provide support to increase UE function _122 provide hand support in natural position _123 provide work surface   POWER WHEELCHAIR CONTROLS  _124 Proportional  _125 Non-Proportional Type Joystick _126 Left  _127 Right _128 provides access for controlling wheelchair   _129 lacks motor control to operate proportional drive control <ENIDPOEUMPNTIRWE>_3<\/XVQMGQQPYPPJKDTO>_671 unable to understand proportional controls  Actuator Control Module  _131 Single  _132 Multiple   _133 Allow the client to operate the power seat function(s) through the joystick control   _134 Safety Reset Switches _135 Used to change modes and stop the wheelchair when driving in latch mode    _136 [employer]   _137 programming for accurate control _138 progressive Disease/changing condition _139 non-proportional drive control needed _140 Needed in order to operate power seat functions through joystick control   _141 Display box _142 Allows user to see in which mode and drive the wheelchair is set  _143 necessary for alternate  controls    _0 Digital interface electronics _1 Allows w/c to operate when using alternative drive controls  <YQIHKVQQVZDGLOVF>_6<\/EPPIRJJOACZYSAYT>_0 ASL Head Array _3 Allows client to operate wheelchair  through switches placed in tri-panel headrest  _4 Sip and puff with tubing kit _5 needed to operate sip and puff drive controls  <ZSWFUXNATFTDDUKG>_2<\/RKYHCWCBJSEGBTDV>_7 Upgraded tracking electronics _7 increase safety when driving <OHYWVPXTGGYIRSWN>_4<\/OEVOJJKKXFGHWEXH>_3 correct tracking when on uneven surfaces  _9 [facility]  for switches or joystick _10 Attaches switches to w/c  _11 Swing away for access or transfers _12 midline for optimal placement _13 provides for consistent access  _14 Attendant controlled joystick plus mount _15 safety _16 long distance driving <ZJIRCVELFYBOFBPZ>_0<\/CHENIDPOEUMPNTIR>_44 operation of seat functions _18 compliance with transportation regulations _19  ?????    Rear wheel placement/Axle adjustability _20 None _21 semi adjustable _22 fully adjustable  _23 improved UE access to wheels _24 improved stability _25 changing angle in space for improvement of postural stability _26 1-arm drive access <RXVQMGQQPYPPJKDT>_2<\/IZTIWPYKDXIPJASN>_05 amputee pad placement _28  ?????  Wheel rims/ hand rims  _29 metal  _30 plastic coated _31 oblique projections _32 vertical projections _33 Provide ability to propel manual wheelchair  _34  Increase self-propulsion with hand weakness/decreased grasp  Push handles _35 extended  _36 angle adjustable  _37 standard _38 caregiver access _39 caregiver assist _40 allows "hooking" to enable increased ability to perform ADLs or maintain balance  One armed device  _41 Lt   _42 Rt _43 enable propulsion of manual wheelchair with one arm   _44  ?????   Brake/wheel lock extension _45  Lt   _46  Rt _47 increase indep in applying wheel locks   _48 Side guards _49 prevent clothing getting caught in wheel or becoming soiled _50  prevent skin tears/abrasions  Battery: U1 x 2 _51 to power wheelchair ?????  Other: ????? ????? ?????  The above equipment has a life- long use expectancy. Growth and changes in medical and/or functional conditions would be the exceptions. This is to certify that the therapist has no financial relationship with durable medical provider or manufacturer. The therapist will not receive remuneration of any kind for the equipment recommended in this evaluation.   Patient has mobility limitation that significantly impairs safe, timely participation in one or more mobility related ADL's.  (bathing, toileting, feeding, dressing, grooming, moving from room to room)                                                              _52  Yes _53  No Will mobility device sufficiently improve ability to participate and/or be aided in participation of MRADL's?         _54  Yes _55  No Can limitation be compensated for with use of a cane or walker?                                                                                _56  Yes _57  No Does patient or caregiver demonstrate ability/potential ability & willingness to safely use the mobility device?   _58  Yes _59  No Does patient's home environment support use of recommended mobility device?                                                    [  x] Yes _0  No Does patient have sufficient upper extremity function necessary to functionally propel a manual wheelchair?    _1  Yes _2  No Does patient have sufficient strength and trunk stability to safely operate a POV (scooter)?                                  _3  Yes _4  No Does patient need additional features/benefits provided by a power wheelchair for MRADL's in the home?       _5  Yes _6  No Does the patient demonstrate the ability to safely use a power wheelchair?                                                              _7  Yes _8  No  Therapist Name Printed: Mady Haagensen, PT Date: 05/02/2018  Therapist's Signature:   Date:   Supplier's Name Printed: ????? Date: ?????  Supplier's Signature:   Date:  Patient/Caregiver Signature:   Date:     This is to certify that I have read this evaluation and do agree with the content within:      Physician's Name Printed: ?????  Physician's Signature:  Date:     This is to certify that I, the above signed therapist have the following affiliations: _9  This DME provider _10  Manufacturer of recommended equipment _11  Patient's long term care facility _12  None of the above                               PT Long Term Goals - 05/02/18 1555      PT LONG TERM GOAL #1   Title  Pt will complete wheelchair assessment/recommendations for power  mobility options for mobility in the home.    Time  2    Period  Weeks    Status  Achieved      PT LONG TERM GOAL #2   Title  Pt will verbalize understanding of fall prevention in home environment.    Time  2    Period  Weeks    Status  Not Met            Plan - 05/02/18 1554    Clinical Impression Statement  Wheelchair vendor present this visit to complete mobility assessment for power mobility.  Pt appears to qualify for power mobility for use in the home to improve functional mobility and decrease fall risk.  Please see full note for details for recommendations.  No further PT needs at this time.    Rehab Potential  Good    PT Frequency  Other (comment)   1 additional visit following eval   PT Treatment/Interventions  Wheelchair mobility training;ADLs/Self Care Home Management    PT Next Visit Plan  W/C and mobility assessment completed; no further PT needs at this time.    Consulted and Agree with Plan of Care  Patient       Patient will benefit from skilled therapeutic intervention in order to improve the following deficits and impairments:  Abnormal gait, Decreased balance, Decreased activity tolerance, Decreased mobility, Difficulty walking, Decreased strength  Visit Diagnosis: Unsteadiness on feet  Muscle weakness (generalized)  Other abnormalities of  gait and mobility     Problem List Patient Active Problem List   Diagnosis Date Noted  . Obesity (BMI 30-39.9) 01/15/2017  . Alcohol use disorder, mild, abuse 01/15/2017  . Closed fracture of left distal femur ([place]) 09/03/2016  . Osteoarthritis of left knee 05/02/2015  . Postlaminectomy syndrome, lumbar region 11/09/2013  . Lumbosacral spondylosis without myelopathy 11/09/2013  . Dyslipidemia 10/11/2013  . Ventral hernia 05/19/2013  . DEPRESSION 06/13/2010  . DIVERTICULOSIS, COLON 07/21/2009  . SYPHILIS 05/14/2009  . DYSHIDROTIC ECZEMA, HANDS 11/06/2008  . Chronic low back pain 11/06/2008  . FIBROIDS,  UTERUS 11/17/2007  . Essential hypertension 11/17/2007    Priyah Schmuck W. 05/02/2018, 3:56 PM  Frazier Butt., PT   [facility] [address] [place], [place], [address] Phone: [phone]   Fax:  [phone]  Name: LOUISA FAVARO MRN: [id number] Date of Birth: Sep 10, 1955  PHYSICAL THERAPY DISCHARGE SUMMARY  Visits from Start of Care: 2  Current functional level related to goals / functional outcomes: PT Long Term Goals - 05/02/18 1555      PT LONG TERM GOAL #1   Title  Pt will complete wheelchair assessment/recommendations for power mobility options for mobility in the home.    Time  2    Period  Weeks    Status  Achieved      PT LONG TERM GOAL #2   Title  Pt will verbalize understanding of fall prevention in home environment.    Time  2    Period  Weeks    Status  Not Met      Wheelchair assessment completed this visit.  Did not fully address fall prevention, but did recommend pt continue using cane and take breaks as needed at home for safety.   Remaining deficits: Balance, pain, limited mobility due to fall risk   Education / Equipment: Educated in mobility assessment with recommendations for power wheelchair as noted above to improve functional mobility and decrease fall risk at home.  Plan: Patient agrees to discharge.  Patient goals were partially met. Patient is being discharged due to being pleased with the current functional level.  ?????     Mady Haagensen, PT 05/02/18 4:00 PM Phone: [phone] Fax: [phone]

## 2018-05-12 ENCOUNTER — Ambulatory Visit: Payer: Medicare Other | Attending: Internal Medicine | Admitting: Internal Medicine

## 2018-05-12 ENCOUNTER — Encounter: Payer: Self-pay | Admitting: Internal Medicine

## 2018-05-12 VITALS — BP 156/82 | HR 56 | Temp 98.4°F | Resp 16 | Ht 64.0 in | Wt 178.6 lb

## 2018-05-12 DIAGNOSIS — Z885 Allergy status to narcotic agent status: Secondary | ICD-10-CM | POA: Diagnosis not present

## 2018-05-12 DIAGNOSIS — Z791 Long term (current) use of non-steroidal anti-inflammatories (NSAID): Secondary | ICD-10-CM | POA: Insufficient documentation

## 2018-05-12 DIAGNOSIS — Z803 Family history of malignant neoplasm of breast: Secondary | ICD-10-CM | POA: Diagnosis not present

## 2018-05-12 DIAGNOSIS — Z7409 Other reduced mobility: Secondary | ICD-10-CM | POA: Insufficient documentation

## 2018-05-12 DIAGNOSIS — R600 Localized edema: Secondary | ICD-10-CM | POA: Diagnosis not present

## 2018-05-12 DIAGNOSIS — G8929 Other chronic pain: Secondary | ICD-10-CM

## 2018-05-12 DIAGNOSIS — Z23 Encounter for immunization: Secondary | ICD-10-CM | POA: Diagnosis not present

## 2018-05-12 DIAGNOSIS — M47817 Spondylosis without myelopathy or radiculopathy, lumbosacral region: Secondary | ICD-10-CM | POA: Insufficient documentation

## 2018-05-12 DIAGNOSIS — Z6830 Body mass index (BMI) 30.0-30.9, adult: Secondary | ICD-10-CM | POA: Insufficient documentation

## 2018-05-12 DIAGNOSIS — M170 Bilateral primary osteoarthritis of knee: Secondary | ICD-10-CM | POA: Insufficient documentation

## 2018-05-12 DIAGNOSIS — M961 Postlaminectomy syndrome, not elsewhere classified: Secondary | ICD-10-CM | POA: Insufficient documentation

## 2018-05-12 DIAGNOSIS — I10 Essential (primary) hypertension: Secondary | ICD-10-CM | POA: Diagnosis not present

## 2018-05-12 DIAGNOSIS — E785 Hyperlipidemia, unspecified: Secondary | ICD-10-CM | POA: Insufficient documentation

## 2018-05-12 DIAGNOSIS — Z87891 Personal history of nicotine dependence: Secondary | ICD-10-CM | POA: Diagnosis not present

## 2018-05-12 DIAGNOSIS — M545 Low back pain: Secondary | ICD-10-CM | POA: Insufficient documentation

## 2018-05-12 DIAGNOSIS — Z79899 Other long term (current) drug therapy: Secondary | ICD-10-CM | POA: Insufficient documentation

## 2018-05-12 DIAGNOSIS — E669 Obesity, unspecified: Secondary | ICD-10-CM | POA: Insufficient documentation

## 2018-05-12 MED ORDER — HYDROCHLOROTHIAZIDE 25 MG PO TABS: 25.0000 mg | ORAL_TABLET | Freq: Every day | ORAL | 3 refills | Status: DC

## 2018-05-12 NOTE — Patient Instructions (Addendum)
Please give patient an appointment with our clinical pharmacist [facility] in 3 weeks for repeat blood pressure check.  Please increase your blood pressure medication hydrochlorothiazide to 25 mg daily.  I have sent a new prescription to your pharmacy.  Td Vaccine (Tetanus and Diphtheria): What You Need to Know 1. Why get vaccinated? Tetanus  and diphtheria are very serious diseases. They are rare in the [place] today, but people who do become infected often have severe complications. Td vaccine is used to protect adolescents and adults from both of these diseases. Both tetanus and diphtheria are infections caused by bacteria. Diphtheria spreads from person to person through coughing or sneezing. Tetanus-causing bacteria enter the body through cuts, scratches, or wounds. TETANUS (Lockjaw) causes painful muscle tightening and stiffness, usually all over the body.  It can lead to tightening of muscles in the head and neck so you can't open your mouth, swallow, or sometimes even breathe. Tetanus kills about 1 out of every 10 people who are infected even after receiving the best medical care. DIPHTHERIA can cause a thick coating to form in the back of the throat.  It can lead to breathing problems, paralysis, heart failure, and death. Before vaccines, as many as 200,000 cases of diphtheria and hundreds of cases of tetanus were reported in the [place] each year. Since vaccination began, reports of cases for both diseases have dropped by about 99%. 2. Td vaccine Td vaccine can protect adolescents and adults from tetanus and diphtheria. Td is usually given as a booster dose every 10 years but it can also be given earlier after a severe and dirty wound or burn. Another vaccine, called Tdap, which protects against pertussis in addition to tetanus and diphtheria, is sometimes recommended instead of Td vaccine. Your doctor or the person giving you the vaccine can give you more information. Td may  safely be given at the same time as other vaccines. 3. Some people should not get this vaccine  A person who has ever had a life-threatening allergic reaction after a previous dose of any tetanus or diphtheria containing vaccine, OR has a severe allergy to any part of this vaccine, should not get Td vaccine. Tell the person giving the vaccine about any severe allergies.  Talk to your doctor if you: ? had severe pain or swelling after any vaccine containing diphtheria or tetanus, ? ever had a condition called Guillain Barr Syndrome (GBS), ? aren't feeling well on the day the shot is scheduled. 4. Risks of a vaccine reaction With any medicine, including vaccines, there is a chance of side effects. These are usually mild and go away on their own. Serious reactions are also possible but are rare. Most people who get Td vaccine do not have any problems with it. Mild Problems following Td vaccine: (Did not interfere with activities)  Pain where the shot was given (about 8 people in 10)  Redness or swelling where the shot was given (about 1 person in 4)  Mild fever (rare)  Headache (about 1 person in 4)  Tiredness (about 1 person in 4) Moderate Problems following Td vaccine: (Interfered with activities, but did not require medical attention)  Fever over 102F (rare) Severe Problems following Td vaccine: (Unable to perform usual activities; required medical attention)  Swelling, severe pain, bleeding and/or redness in the arm where the shot was given (rare). Problems that could happen after any vaccine:  People sometimes faint after a medical procedure, including vaccination. Sitting or lying down  for about 15 minutes can help prevent fainting, and injuries caused by a fall. Tell your doctor if you feel dizzy, or have vision changes or ringing in the ears.  Some people get severe pain in the shoulder and have difficulty moving the arm where a shot was given. This happens very  rarely.  Any medication can cause a severe allergic reaction. Such reactions from a vaccine are very rare, estimated at fewer than 1 in a million doses, and would happen within a few minutes to a few hours after the vaccination. As with any medicine, there is a very remote chance of a vaccine causing a serious injury or death. The safety of vaccines is always being monitored. For more information, visit: http://www.aguilar.org/ 5. What if there is a serious reaction? What should I look for?  Look for anything that concerns you, such as signs of a severe allergic reaction, very high fever, or unusual behavior. Signs of a severe allergic reaction can include hives, swelling of the face and throat, difficulty breathing, a fast heartbeat, dizziness, and weakness. These would usually start a few minutes to a few hours after the vaccination. What should I do?  If you think it is a severe allergic reaction or other emergency that can't wait, call 9-1-1 or get the person to the nearest hospital. Otherwise, call your doctor.  Afterward, the reaction should be reported to the Vaccine Adverse Event Reporting System (VAERS). Your doctor might file this report, or you can do it yourself through the VAERS web site at www.vaers.SamedayNews.es, or by calling (705)717-6347. VAERS does not give medical advice. 6. The National Vaccine Injury Compensation Program The [employer] Vaccine Injury Compensation Program (VICP) is a federal program that was created to compensate people who may have been injured by certain vaccines. Persons who believe they may have been injured by a vaccine can learn about the program and about filing a claim by calling 605-667-7995 or visiting the [place] website at GoldCloset.com.ee. There is a time limit to file a claim for compensation. 7. How can I learn more?  Ask your doctor. He or she can give you the vaccine package insert or suggest other sources of information.  Call  your local or state health department.  Contact the Centers for Disease Control and Prevention (CDC): ? Call 281-722-9409 (1-800-CDC-INFO) ? Visit CDC's website at http://hunter.com/ Vaccine Information Statement Td Vaccine (07/23/15) This information is not intended to replace advice given to you by your health care provider. Make sure you discuss any questions you have with your health care provider. Document Released: 01/25/2006 Document Revised: 11/15/2017 Document Reviewed: 11/15/2017 Elsevier Interactive Patient Education  2019 [employer].

## 2018-05-12 NOTE — Progress Notes (Signed)
Patient ID: Shannon [name], female    DOB: 1955/11/02  MRN: [id number]  CC: Mobility Examination   Subjective: Shannon [name] is a 63 y.o. female who presents for mobility evaluation for Hoveround chair. Her concerns today include:  Patient with history of HTN, chronic lower back pain ( hx of L4-5 decompression surgery), HL,ETOH use disorder, OA knees.  Patient was seen 03/15/2018 for the same.  I referred her to physical therapy for more detail evaluation and to assess suitability for Hoveround.  I have received that report.    Patient presents today for mobility examination to be considered for [facility] chair. Patient with history of arthritis in both knees and chronic LBP with hx of lumbar decompression surgery.  Left is more bothersome than the right.  She used to see Dr. Stann Mainland her orthopedics.  In the summer he referred her to [facility] for pain management.  She was placed on Butrans patch which she did not find helpful.  She has not been back for follow-up.  Rates her pain as 5/10 most days.  Using Mobic.  Has been using an OTC med called Body, Back and Pain which helps but the store has been out of it for several wks.  Still has not been able to find in stores since last visit 03/2018  She is requesting a Hoveround chair because of increased difficulty getting around in her two-bedroom apartment.  She currently gets to and from her appointments using medical transportation "To cook, clean the dishes or make up my bed, I have to sit down.  It just hurts so bad in my legs.  I stagger a lot too. I can barely make it to the front door."  She reports falling intermittently.  "My legs just give out on me."  [address] [address] while trying to get in shower last wk and has fallen about 5 times in the last 6 mths.  She ambulates with a cane but feels that it does not help to break falls and knees still hurt when she walks.  She does not have a walker or manual wheelchair.  However she states  that she has used a friend's walker in the past and feels that her arms wear out too quickly to use a walker or manual wheelchair.  She does not feel that she would be able to learn the use of a controlled stick with a mobility scooter.  She feels that with a Hoveround will increase her mobility in the home.  She would be able to do her activities of daily living in her apartment like getting to the kitchen to cook, washing the dishes, some cleaning, transferring from one surface to another and getting on and off the commode with it.  Has shower bars and bath chair in her tub/shower.  She has both shower and bathtub.   Since last visit she never went back to see her pain specialist. Given rxn for rollator walker on last visit.  She did not get this as yet.   HTN:  Took meds already for this a.m.  No device to check BP.  Limits salt in foods.  No CP.  Some swelling at the back of legs.   Patient Active Problem List   Diagnosis Date Noted  . Obesity (BMI 30-39.9) 01/15/2017  . Alcohol use disorder, mild, abuse 01/15/2017  . Closed fracture of left distal femur ([place]) 09/03/2016  . Osteoarthritis of left knee 05/02/2015  . Postlaminectomy syndrome, lumbar region 11/09/2013  .  Lumbosacral spondylosis without myelopathy 11/09/2013  . Dyslipidemia 10/11/2013  . Ventral hernia 05/19/2013  . DEPRESSION 06/13/2010  . DIVERTICULOSIS, COLON 07/21/2009  . SYPHILIS 05/14/2009  . DYSHIDROTIC ECZEMA, HANDS 11/06/2008  . Chronic low back pain 11/06/2008  . FIBROIDS, UTERUS 11/17/2007  . Essential hypertension 11/17/2007     Current Outpatient Medications on File Prior to Visit  Medication Sig Dispense Refill  . fenofibrate (TRICOR) 48 MG tablet Take 1 tablet (48 mg total) by mouth daily. 90 tablet 3  . fluticasone (FLONASE) 50 MCG/ACT nasal spray Place 2 sprays into both nostrils daily. 16 g 0  . hydrALAZINE (APRESOLINE) 50 MG tablet Take 1 tablet (50 mg total) by mouth 3 (three) times daily. 270  tablet 3  . lisinopril (PRINIVIL,ZESTRIL) 40 MG tablet Take 1 tablet (40 mg total) by mouth daily. 90 tablet 3  . meloxicam (MOBIC) 15 MG tablet Take 1 tablet (15 mg total) by mouth daily. 90 tablet 1  . metoprolol succinate (TOPROL-XL) 100 MG 24 hr tablet Take 1 tablet (100 mg total) by mouth daily. Take with or immediately following a meal. 90 tablet 3  . buprenorphine (BUTRANS) 20 MCG/HR PTWK patch Place 20 mcg onto the skin once a week.     No current facility-administered medications on file prior to visit.     Allergies  Allergen Reactions  . Morphine And Related     Itch    Social History   Socioeconomic History  . Marital status: Legally Separated    Spouse name: Not on file  . Number of children: Not on file  . Years of education: Not on file  . Highest education level: Not on file  Occupational History  . Not on file  Social Needs  . Financial resource strain: Not on file  . Food insecurity:    Worry: Not on file    Inability: Not on file  . Transportation needs:    Medical: Not on file    Non-medical: Not on file  Tobacco Use  . Smoking status: Former Research scientist (life sciences)  . Smokeless tobacco: Never Used  Substance and Sexual Activity  . Alcohol use: Yes    Alcohol/week: 2.0 standard drinks    Types: 2 Cans of beer per week  . Drug use: No  . Sexual activity: Yes    Birth control/protection: Surgical  Lifestyle  . Physical activity:    Days per week: Not on file    Minutes per session: Not on file  . Stress: Not on file  Relationships  . Social connections:    Talks on phone: Not on file    Gets together: Not on file    Attends religious service: Not on file    Active member of club or organization: Not on file    Attends meetings of clubs or organizations: Not on file    Relationship status: Not on file  . Intimate partner violence:    Fear of current or ex partner: Not on file    Emotionally abused: Not on file    Physically abused: Not on file    Forced  sexual activity: Not on file  Other Topics Concern  . Not on file  Social History Narrative   ** Merged History Encounter **        Family History  Problem Relation Age of Onset  . Stroke Mother   . Breast cancer Sister     Past Surgical History:  Procedure Laterality Date  . ABDOMINAL HYSTERECTOMY    .  BACK SURGERY  09/01/2010   L4-5 lumbar decompression including laminotomy, facetectomy,   . colonscopy    . FEMUR IM NAIL Left 09/05/2016  . FEMUR IM NAIL Left 09/05/2016   Procedure: INTRAMEDULLARY (IM) NAIL FEMORAL;  Surgeon: Nicholes Stairs, MD;  Location: [facility];  Service: Orthopedics;  Laterality: Left;  . FRACTURE SURGERY     left leg  . INSERTION OF MESH N/A 05/24/2013   Procedure: INSERTION OF MESH;  Surgeon: Imogene Burn. Georgette Dover, MD;  Location: [facility];  Service: General;  Laterality: N/A;  . UTERINE FIBROID SURGERY    . VENTRAL HERNIA REPAIR N/A 05/24/2013   Procedure: OPEN REPAIR EPIGASTRIC VENTRAL HERNIA;  Surgeon: Imogene Burn. Tsuei, MD;  Location: [facility];  Service: General;  Laterality: N/A;    ROS: Review of Systems Neg except as above PHYSICAL EXAM: BP (!) 156/82   Pulse (!) 56   Temp 98.4 F (36.9 C) (Oral)   Resp 16   Ht 5\' 4"  (1.626 m)   Wt 178 lb 9.6 oz (81 kg)   SpO2 97%   BMI 30.66 kg/m    Wt Readings from Last 3 Encounters:  05/12/18 178 lb 9.6 oz (81 kg)  03/15/18 172 lb 12.8 oz (78.4 kg)  11/12/17 178 lb 3.2 oz (80.8 kg)    Physical Exam  General appearance - alert, well appearing, and in no distress Mental status - normal mood, behavior, speech, dress, motor activity, and thought processes Chest - clear to auscultation, no wheezes, rales or rhonchi, symmetric air entry Heart - normal rate, regular rhythm, normal S1, S2, no murmurs, rubs, clicks or gallops Neurological -grip 4/5 BL, power in UEs prox/distal 4/5. Power in LEs 3-4/5 BL.  Gross sensation is intact.  Gait is wide-based with low foot to floor clearance.  She favors the left knee.   She ambulates with a cane but gait is still unsteady even with use of the cane. Musculoskeletal -left knee: Moderate joint enlargement.  Moderate crepitus and discomfort with passive range of motion.  Right knee mild joint enlargement.  No crepitus on palpation.  Mild discomfort with passive range of motion. Extremities -trace lower extremity edema.   ASSESSMENT AND PLAN:  1. Osteoarthritis of both knees, unspecified osteoarthritis type 2. Limited mobility 3. Chronic bilateral low back pain without sciatica -Patient with limited mobility due to advanced arthritis in the knees particularly the left knee and chronic lower back pain post laminectomy.  She ambulates with a cane but has had falls even with use of the cane in the home.    Manual wheelchair and walker are not a viable option due to chronic back pain from previous back surgery and decreased endurance.  Using a scooter is not an option because use of a steering tilter is likely to exacerbate back pain.  ADLs that are affected by her limited mobility include getting to the kitchen and standing for meal prep, housekeeping, transfers and toileting.  The patient is willing and motivated to use the power mobility device in the home and can safely operate the power mobility device both mentally and physically.  Patient does have some trace lower extremity edema and would probably benefit from elevating leg rest on her device.   4. Essential hypertension Not at goal.  Recommend increase HCTZ to 25 mg daily.  Continue DASH diet.  Follow-up with clinical pharmacist in 3 weeks for repeat blood pressure check.  She will need to stop at the lab to have a BMP  done at that time. - hydrochlorothiazide (HYDRODIURIL) 25 MG tablet; Take 1 tablet (25 mg total) by mouth daily.  Dispense: 90 tablet; Refill: 3  5. Need for Tdap vaccination Given today  Patient was given the opportunity to ask questions.  Patient verbalized understanding of the plan and was  able to repeat key elements of the plan.   No orders of the defined types were placed in this encounter.    Requested Prescriptions   Signed Prescriptions Disp Refills  . hydrochlorothiazide (HYDRODIURIL) 25 MG tablet 90 tablet 3    Sig: Take 1 tablet (25 mg total) by mouth daily.    Return in about 3 months (around 08/11/2018).  Karle Plumber, MD, FACP

## 2018-05-16 ENCOUNTER — Telehealth: Payer: Self-pay

## 2018-05-16 NOTE — Telephone Encounter (Signed)
Application for Hoveround has been completed.  As per instructions, clinical documentaion department is to be contacted # [phone] and then the items are to be faxed to # [phone]. Three attempts were made to day to contact the clinical documentation department and the calls were put on hold, no one answered even after extended amounts of time on hold.

## 2018-05-17 ENCOUNTER — Telehealth: Payer: Self-pay

## 2018-05-17 NOTE — Telephone Encounter (Signed)
Call placed to [facility] # [phone], spoke to Alix who stated that after the application is reviewed,  they will be faxing a detailed product description that the provider will need to sign off.   Completed application was faxed to [facility]

## 2018-05-18 NOTE — Telephone Encounter (Signed)
Sandy at [facility] called REF# [id number].  [phone].  Lovey Newcomer wants to confirm the application was reviewed and Hoveround faxed a description for the provider to sign. Please check to see that the form was received via fax.

## 2018-05-18 NOTE — Telephone Encounter (Signed)
Form has been received will fax it when received from provider

## 2018-05-30 ENCOUNTER — Ambulatory Visit
Admission: RE | Admit: 2018-05-30 | Discharge: 2018-05-30 | Disposition: A | Discharge status: Home / Self Care | Payer: Medicare Other | Source: Ambulatory Visit | Attending: Internal Medicine | Admitting: Internal Medicine

## 2018-05-30 DIAGNOSIS — Z1231 Encounter for screening mammogram for malignant neoplasm of breast: Secondary | ICD-10-CM | POA: Diagnosis not present

## 2018-06-02 ENCOUNTER — Telehealth: Payer: Self-pay

## 2018-06-02 ENCOUNTER — Encounter: Payer: Self-pay | Admitting: Pharmacist

## 2018-06-02 ENCOUNTER — Ambulatory Visit: Payer: Medicare Other | Attending: Family Medicine | Admitting: Pharmacist

## 2018-06-02 VITALS — BP 116/77 | HR 73

## 2018-06-02 DIAGNOSIS — Z79899 Other long term (current) drug therapy: Secondary | ICD-10-CM | POA: Insufficient documentation

## 2018-06-02 DIAGNOSIS — Z720 Tobacco use: Secondary | ICD-10-CM | POA: Diagnosis not present

## 2018-06-02 DIAGNOSIS — I10 Essential (primary) hypertension: Secondary | ICD-10-CM | POA: Diagnosis not present

## 2018-06-02 NOTE — Telephone Encounter (Signed)
Contacted pt to go over MM results pt is aware and doesn't have any questions or concerns

## 2018-06-02 NOTE — Progress Notes (Signed)
   S:   PCP: Dr. Wynetta Emery   Patient arrives good spirits. Presents to the clinic for hypertension management. Patient was referred by Dr. Wynetta Emery on 05/12/18.    Patient reports adherence with medications. Reports dizziness but denies chest pain, dyspnea, HA or blurred vision.   Current BP Medications include:  Toprol XL 100 mg, lisinopril 40 mg, HCTZ 25 mg (still taking 12.5), hydralazine 50 mg TID (BID)  Dietary habits include: does not limit salt, consumes caffeine with every meal Exercise habits include: does not exercise Family / Social history:  - Stroke (mother) - Current everyday smoker (3-4 cig) - 2 alcoholic drinks per week (1 drink = 16 oz beer)  Home BP readings: does not have a cuff  O:  L arm after 5 minutes rest: 116/77, HR 73  Last 3 Office BP readings: BP Readings from Last 3 Encounters:  05/12/18 (!) 156/82  03/15/18 134/89  11/12/17 138/80   BMET    Component Value Date/Time   NA 146 (H) 11/12/2017 0920   K 3.8 11/12/2017 0920   CL 107 (H) 11/12/2017 0920   CO2 23 11/12/2017 0920   GLUCOSE 100 (H) 11/12/2017 0920   GLUCOSE 109 (H) 09/03/2016 1440   BUN 17 11/12/2017 0920   CREATININE 0.91 11/12/2017 0920   CREATININE 0.90 05/02/2015 1608   CALCIUM 9.6 11/12/2017 0920   GFRNONAA 69 11/12/2017 0920   GFRNONAA 54 (L) 09/27/2014 1152   GFRAA 80 11/12/2017 0920   GFRAA 63 09/27/2014 1152   Renal function: CrCl cannot be calculated (Patient's most recent lab result is older than the maximum 21 days allowed.).  Clinical ASCVD: No  The 10-year ASCVD risk score Mikey Bussing DC Jr., et al., 2013) is: 17.3%   Values used to calculate the score:     Age: 63 years     Sex: Female     Is Non-Hispanic African American: Yes     Diabetic: No     Tobacco smoker: Yes     Systolic Blood Pressure: 782 mmHg     Is BP treated: Yes     HDL Cholesterol: 58 mg/dL     Total Cholesterol: 153 mg/dL  A/P: Hypertension longstanding currently controlled on current  medications. BP Goal = <130/80 mmHg. Patient is not adherent with current medications. She has not started the 25 mg dose of HCTZ. Will have her start this and continue lisinopril and Toprol XL. I believe the hydralazine is contributing to her dizziness. Will have her hold this for now given that we are increasing HCTZ.  -Continued Lisinopril, Toprol XL. - Increased HCTZ to 25 mg daily. - Stop hydralazine -F/u labs ordered - BMP -Counseled on lifestyle modifications for blood pressure control including reduced dietary sodium, increased exercise, adequate sleep  Results reviewed and written information provided. Total time in face-to-face counseling 30 minutes.   F/U Clinic Visit in 2 weeks.    Patient seen with: Beckey Rutter, PharmD Candidate  [facility] [employer]  Class of 2022  Benard Halsted, PharmD, [place] [phone]

## 2018-06-02 NOTE — Patient Instructions (Addendum)
Thank you for coming to see [place] today.   Blood pressure today is well-controlled.    Stop hydralazine and increase your HCTZ to 25 mg.  Continue to take Toprol, HCTZ, and lisinopril for blood pressure.   Limiting salt and caffeine, as well as exercising as able for at least 30 minutes for 5 days out of the week, can also help you lower your blood pressure.  Take your blood pressure at home if you are able. Please write down these numbers and bring them to your visits.  If you have any questions about medications, please call me [phone].  Lurena Joiner

## 2018-06-03 ENCOUNTER — Telehealth: Payer: Self-pay | Admitting: Internal Medicine

## 2018-06-03 LAB — BASIC METABOLIC PANEL
BUN/Creatinine Ratio: 13 (ref 12–28)
BUN: 15 mg/dL (ref 8–27)
CO2: 22 mmol/L (ref 20–29)
Calcium: 9.8 mg/dL (ref 8.7–10.3)
Chloride: 106 mmol/L (ref 96–106)
Creatinine, Ser: 1.18 mg/dL — ABNORMAL HIGH (ref 0.57–1.00)
GFR calc Af Amer: 58 mL/min/{1.73_m2} — ABNORMAL LOW (ref >59)
GFR, EST NON AFRICAN AMERICAN: 50 mL/min/{1.73_m2} — AB (ref >59)
GLUCOSE: 91 mg/dL (ref 65–99)
POTASSIUM: 4.9 mmol/L (ref 3.5–5.2)
SODIUM: 142 mmol/L (ref 134–144)

## 2018-06-03 NOTE — Telephone Encounter (Signed)
PC placed to pt today to go over lab results. Pt informed that her kidney function is not 100%.  Encouraged her to drink several glasses of water daily.  I asked her whether she stopped the Hydralazine as instructed by clinical pharmacy.  She told me she was told to stop the Hydralazine and HCTZ.  Advised to stop only the Hydralazine and let me know if she still has dizziness.

## 2018-06-16 ENCOUNTER — Encounter: Payer: Self-pay | Admitting: Pharmacist

## 2018-06-16 ENCOUNTER — Ambulatory Visit: Payer: Medicare Other | Attending: Family Medicine | Admitting: Pharmacist

## 2018-06-16 VITALS — BP 111/75 | HR 56

## 2018-06-16 DIAGNOSIS — Z79899 Other long term (current) drug therapy: Secondary | ICD-10-CM | POA: Diagnosis not present

## 2018-06-16 DIAGNOSIS — I10 Essential (primary) hypertension: Secondary | ICD-10-CM

## 2018-06-16 MED ORDER — HYDROCHLOROTHIAZIDE 12.5 MG PO TABS: 12.5000 mg | ORAL_TABLET | Freq: Every day | ORAL | 0 refills | Status: DC

## 2018-06-16 NOTE — Progress Notes (Signed)
   S:   PCP: Dr. Wynetta Emery   Patient arrives in good spirits. Presents to the clinic for hypertension management. Patient was referred by Dr. Wynetta Emery on 05/12/18.  I last saw her 06/02/18 - BP 116/77. Stopped hydralazine, increased HCTZ to 25 mg daily.  Patient reports adherence with medications. She did stop hydralazine but continues to take HCTZ 12.5 mg daily.  Denies chest pain, dyspnea, HA or blurred vision. Denies dizziness since d/c hydralazine.   Current BP Medications include:  Toprol XL 100 mg, lisinopril 40 mg, HCTZ 25 mg (pt is still taking 12.5 mg)  Dietary habits include:  - reports that she has reduced salt intake - consuming caffeine throughout the day Exercise habits include:  - Does not exercise Family / Social history:  - Stroke (mother) - reports not smoking - 2 alcoholic drinks per week (1 drink = 16 oz beer)  Home BP readings: does not have a cuff  O:  L arm after 5 minutes rest: 111/75, HR 56  Last 3 Office BP readings: BP Readings from Last 3 Encounters:  06/16/18 111/75  06/02/18 116/77  05/12/18 (!) 156/82   BMET    Component Value Date/Time   NA 142 06/02/2018 1025   K 4.9 06/02/2018 1025   CL 106 06/02/2018 1025   CO2 22 06/02/2018 1025   GLUCOSE 91 06/02/2018 1025   GLUCOSE 109 (H) 09/03/2016 1440   BUN 15 06/02/2018 1025   CREATININE 1.18 (H) 06/02/2018 1025   CREATININE 0.90 05/02/2015 1608   CALCIUM 9.8 06/02/2018 1025   GFRNONAA 50 (L) 06/02/2018 1025   GFRNONAA 54 (L) 09/27/2014 1152   GFRAA 58 (L) 06/02/2018 1025   GFRAA 63 09/27/2014 1152   Renal function: CrCl cannot be calculated (Unknown ideal weight.).  Clinical ASCVD: No  The 10-year ASCVD risk score Mikey Bussing DC Jr., et al., 2013) is: 6.7%   Values used to calculate the score:     Age: 63 years     Sex: Female     Is Non-Hispanic African American: Yes     Diabetic: No     Tobacco smoker: Yes     Systolic Blood Pressure: 314 mmHg     Is BP treated: Yes     HDL  Cholesterol: 58 mg/dL     Total Cholesterol: 153 mg/dL  A/P: Hypertension longstanding currently controlled on current medications. BP Goal = <130/80 mmHg. Patient reports adherence with current medications, however, she continues to take HCTZ 12.5 mg. Since her BP is well-controlled, will have her continue her current regimen. She has been informed that we are continuing HCTZ 12.5 mg for now.   -Continued Lisinopril, Toprol XL, HCTZ  -Counseled on lifestyle modifications for blood pressure control including reduced dietary sodium, increased exercise, adequate sleep - HM: patient requested flu shot; none in clinic - encouraged to seek one at a community pharmacy   Results reviewed and written information provided. Total time in face-to-face counseling 30 minutes.   F/U w/ PCP in May.   Patient seen with: Bobbye Riggs, PharmD Candidate  [place] [employer] [facility]  Class of 2022  Benard Halsted, PharmD, [place] [phone]

## 2018-06-16 NOTE — Patient Instructions (Signed)
Thank you for coming to see [place] today.   Blood pressure today is well controlled.  Continue taking:   1. Continue Lisinopril  2. Continue Metoprolol 3. Continue HCTZ  Limiting salt and caffeine, as well as exercising as able for at least 30 minutes for 5 days out of the week, can also help you lower your blood pressure.  Take your blood pressure at home if you are able. Please write down these numbers and bring them to your visits.  If you have any questions about medications, please call me [phone].  Lurena Joiner

## 2018-07-18 ENCOUNTER — Ambulatory Visit: Payer: Medicare Other | Attending: Family Medicine | Admitting: Pharmacist

## 2018-07-18 ENCOUNTER — Other Ambulatory Visit: Payer: Self-pay

## 2018-07-18 ENCOUNTER — Encounter: Payer: Self-pay | Admitting: Pharmacist

## 2018-07-18 VITALS — BP 117/82 | HR 58

## 2018-07-18 DIAGNOSIS — I10 Essential (primary) hypertension: Secondary | ICD-10-CM | POA: Diagnosis not present

## 2018-07-18 DIAGNOSIS — Z79899 Other long term (current) drug therapy: Secondary | ICD-10-CM | POA: Insufficient documentation

## 2018-07-18 NOTE — Progress Notes (Signed)
   S:   PCP: Dr. Wynetta Emery   Patient arrives in good spirits. Presents to the clinic for hypertension management. Patient was referred by Dr. Wynetta Emery on 05/12/18.  I last saw her 06/16/18 - BP 111/75.  Patient reports adherence with medications.  Denies chest pain, dyspnea, HA or blurred vision. Denies dizziness.   Current BP Medications include:  Toprol XL 100 mg, lisinopril 40 mg, HCTZ 12.5 mg  Dietary habits include:  - reports that she has reduced salt intake - consuming caffeine throughout the day Exercise habits include:  - Does not exercise Family / Social history:  - Stroke (mother) - reports not smoking - 2 alcoholic drinks per week (1 drink = 16 oz beer)  Home BP readings: does not have a cuff  O:  L arm after 5 minutes rest: 111/82, HR 58  Last 3 Office BP readings: BP Readings from Last 3 Encounters:  07/18/18 117/82  06/16/18 111/75  06/02/18 116/77   BMET    Component Value Date/Time   NA 142 06/02/2018 1025   K 4.9 06/02/2018 1025   CL 106 06/02/2018 1025   CO2 22 06/02/2018 1025   GLUCOSE 91 06/02/2018 1025   GLUCOSE 109 (H) 09/03/2016 1440   BUN 15 06/02/2018 1025   CREATININE 1.18 (H) 06/02/2018 1025   CREATININE 0.90 05/02/2015 1608   CALCIUM 9.8 06/02/2018 1025   GFRNONAA 50 (L) 06/02/2018 1025   GFRNONAA 54 (L) 09/27/2014 1152   GFRAA 58 (L) 06/02/2018 1025   GFRAA 63 09/27/2014 1152   Renal function: CrCl cannot be calculated (Patient's most recent lab result is older than the maximum 21 days allowed.).  Clinical ASCVD: No  The 10-year ASCVD risk score Mikey Bussing DC Jr., et al., 2013) is: 7.8%   Values used to calculate the score:     Age: 39 years     Sex: Female     Is Non-Hispanic African American: Yes     Diabetic: No     Tobacco smoker: Yes     Systolic Blood Pressure: 081 mmHg     Is BP treated: Yes     HDL Cholesterol: 58 mg/dL     Total Cholesterol: 153 mg/dL  A/P: Hypertension longstanding currently controlled on current  medications. BP Goal = <130/80 mmHg. Patient reports adherence with current medications.   -Continued Lisinopril, Toprol XL, HCTZ  -Counseled on lifestyle modifications for blood pressure control including reduced dietary sodium, increased exercise, adequate sleep - HM: patient requested flu shot; none in clinic - encouraged to seek one at a community pharmacy   Results reviewed and written information provided. Total time in face-to-face counseling 30 minutes.   F/U w/ PCP in May.   Benard Halsted, PharmD, [place] [phone]

## 2018-07-18 NOTE — Patient Instructions (Signed)

## 2018-08-19 ENCOUNTER — Ambulatory Visit: Payer: Medicare Other | Admitting: Internal Medicine

## 2018-08-22 ENCOUNTER — Ambulatory Visit: Payer: Medicare Other | Attending: Internal Medicine | Admitting: Internal Medicine

## 2018-08-22 ENCOUNTER — Other Ambulatory Visit: Payer: Self-pay

## 2018-08-22 DIAGNOSIS — M170 Bilateral primary osteoarthritis of knee: Secondary | ICD-10-CM | POA: Diagnosis not present

## 2018-08-22 DIAGNOSIS — Z7189 Other specified counseling: Secondary | ICD-10-CM

## 2018-08-22 DIAGNOSIS — N289 Disorder of kidney and ureter, unspecified: Secondary | ICD-10-CM | POA: Diagnosis not present

## 2018-08-22 DIAGNOSIS — G8929 Other chronic pain: Secondary | ICD-10-CM | POA: Diagnosis not present

## 2018-08-22 DIAGNOSIS — M545 Low back pain: Secondary | ICD-10-CM | POA: Diagnosis not present

## 2018-08-22 DIAGNOSIS — I10 Essential (primary) hypertension: Secondary | ICD-10-CM

## 2018-08-22 DIAGNOSIS — E785 Hyperlipidemia, unspecified: Secondary | ICD-10-CM

## 2018-08-22 MED ORDER — MELOXICAM 15 MG PO TABS: 15.0000 mg | ORAL_TABLET | Freq: Every day | ORAL | 1 refills | Status: DC

## 2018-08-22 MED ORDER — FENOFIBRATE 48 MG PO TABS: 48.0000 mg | ORAL_TABLET | Freq: Every day | ORAL | 1 refills | Status: DC

## 2018-08-22 MED ORDER — METOPROLOL SUCCINATE ER 100 MG PO TB24: 100.0000 mg | ORAL_TABLET | Freq: Every day | ORAL | 1 refills | Status: DC

## 2018-08-22 MED ORDER — HYDROCHLOROTHIAZIDE 12.5 MG PO TABS: 12.5000 mg | ORAL_TABLET | Freq: Every day | ORAL | 1 refills | Status: DC

## 2018-08-22 MED ORDER — LISINOPRIL 40 MG PO TABS: 40.0000 mg | ORAL_TABLET | Freq: Every day | ORAL | 1 refills | Status: DC

## 2018-08-22 NOTE — Progress Notes (Signed)
Pt states she is having pain in lower back

## 2018-08-22 NOTE — Progress Notes (Signed)
Virtual Visit via Telephone Note Due to current restrictions/limitations of in-office visits due to the COVID-19 pandemic, this scheduled clinical appointment was converted to a telehealth visit  I connected with Shannon [name] on 08/22/18 at 9:57 a.m EDT by telephone and verified that I am speaking with the correct person using two identifiers. I am in my office.  The patient is at home.  Only the patient and myself participated in this encounter.  I discussed the limitations, risks, security and privacy concerns of performing an evaluation and management service by telephone and the availability of in person appointments. I also discussed with the patient that there may be a patient responsible charge related to this service. The patient expressed understanding and agreed to proceed.  History of Present Illness: Patient with history of HTN, chronic lower back pain ( hx of L4-5 decompression surgery), HL,ETOH use disorder, OA knees. Last seen 04/2018  OA:  Using motorized chair at home. Uses cane or walker when not using her chair.  No falls.  Needing RF on Mobic which she takes at nights.  Finds it helpful.  Her friend does her grocery shopping Back hurts sometimes.  She takes Aleve BID but out of it for a few wks.  I went over with her results of her last BMP which showed decline in GFR from 80 to 58.  Creatinine also increased from 0.91 to 1.18  HTN:  No device to check BP.  Saw the clinical pharmacist x 2 since last visit with me.  BP was good. No HA/dizzines/CP/SOB Limits salt in foods.   HL:  Tolerating and taking Tricor  Observations/Objective:  Results for orders placed or performed in visit on 29/93/71  Basic Metabolic Panel  Result Value Ref Range   Glucose 91 65 - 99 mg/dL   BUN 15 8 - 27 mg/dL   Creatinine, Ser 1.18 (H) 0.57 - 1.00 mg/dL   GFR calc non Af Amer 50 (L) >59 mL/min/1.73   GFR calc Af Amer 58 (L) >59 mL/min/1.73   BUN/Creatinine Ratio 13 12 - 28   Sodium 142 134 - 144 mmol/L   Potassium 4.9 3.5 - 5.2 mmol/L   Chloride 106 96 - 106 mmol/L   CO2 22 20 - 29 mmol/L   Calcium 9.8 8.7 - 10.3 mg/dL    Assessment and Plan: 1. Essential hypertension Blood pressure when last check with clinical pharmacist was at goal.  Patient to continue current medications and low-salt diet. - metoprolol succinate (TOPROL-XL) 100 MG 24 hr tablet; Take 1 tablet (100 mg total) by mouth daily. Take with or immediately following a meal.  Dispense: 90 tablet; Refill: 1 - lisinopril (ZESTRIL) 40 MG tablet; Take 1 tablet (40 mg total) by mouth daily.  Dispense: 90 tablet; Refill: 1 - Basic Metabolic Panel; Future  2. Dyslipidemia - fenofibrate (TRICOR) 48 MG tablet; Take 1 tablet (48 mg total) by mouth daily.  Dispense: 90 tablet; Refill: 1  3. Osteoarthritis of both knees, unspecified osteoarthritis type 4. Chronic bilateral low back pain without sciatica Advised patient to stop Aleve as above Aleve and meloxicam are anti-inflammatory and can have adverse effect on the kidneys.  She will discontinue Aleve and use only meloxicam. Recommend using some Salonpas rub on the lower back twice a day.  This can be purchased over-the-counter. - meloxicam (MOBIC) 15 MG tablet; Take 1 tablet (15 mg total) by mouth daily.  Dispense: 90 tablet; Refill: 1  5. Educated About Covid-19 Virus Infection Patient educated  about how to avoid getting COVID-19 virus infection.  Social distancing encouraged.  Advised to wear a mask when out in public.  Good and frequent handwashing recommended, avoid touching the face and nose often.  6.  Renal insufficiency Likely due to overuse of NSAIDs.  See discussion and #4 above   Follow Up Instructions: F/u in 4 mths   I discussed the assessment and treatment plan with the patient. The patient was provided an opportunity to ask questions and all were answered. The patient agreed with the plan and demonstrated an understanding of the  instructions.   The patient was advised to call back or seek an in-person evaluation if the symptoms worsen or if the condition fails to improve as anticipated.  I provided 11 minutes of non-face-to-face time during this encounter.   Karle Plumber, MD

## 2018-09-02 ENCOUNTER — Other Ambulatory Visit: Payer: Self-pay

## 2018-09-02 ENCOUNTER — Ambulatory Visit: Payer: Medicare Other | Attending: Internal Medicine

## 2018-09-02 DIAGNOSIS — I10 Essential (primary) hypertension: Secondary | ICD-10-CM | POA: Diagnosis not present

## 2018-09-03 LAB — BASIC METABOLIC PANEL
BUN/Creatinine Ratio: 19 (ref 12–28)
BUN: 21 mg/dL (ref 8–27)
CO2: 22 mmol/L (ref 20–29)
Calcium: 10.6 mg/dL — ABNORMAL HIGH (ref 8.7–10.3)
Chloride: 103 mmol/L (ref 96–106)
Creatinine, Ser: 1.13 mg/dL — ABNORMAL HIGH (ref 0.57–1.00)
GFR calc Af Amer: 61 mL/min/{1.73_m2} (ref >59)
GFR calc non Af Amer: 53 mL/min/{1.73_m2} — ABNORMAL LOW (ref >59)
Glucose: 113 mg/dL — ABNORMAL HIGH (ref 65–99)
Potassium: 4.5 mmol/L (ref 3.5–5.2)
Sodium: 145 mmol/L — ABNORMAL HIGH (ref 134–144)

## 2018-09-04 ENCOUNTER — Other Ambulatory Visit: Payer: Self-pay | Admitting: Internal Medicine

## 2018-09-07 ENCOUNTER — Telehealth: Payer: Self-pay

## 2018-09-07 NOTE — Telephone Encounter (Signed)
Contacted pt to go over lab results pt is aware and doesn't have any questions or concerns 

## 2018-09-12 ENCOUNTER — Other Ambulatory Visit: Payer: Medicare Other

## 2018-12-23 ENCOUNTER — Ambulatory Visit (HOSPITAL_BASED_OUTPATIENT_CLINIC_OR_DEPARTMENT_OTHER): Payer: Medicare Other | Admitting: Pharmacist

## 2018-12-23 ENCOUNTER — Other Ambulatory Visit: Payer: Self-pay

## 2018-12-23 ENCOUNTER — Encounter: Payer: Self-pay | Admitting: Internal Medicine

## 2018-12-23 ENCOUNTER — Ambulatory Visit: Payer: Medicare Other | Attending: Internal Medicine | Admitting: Internal Medicine

## 2018-12-23 VITALS — BP 140/100 | HR 52 | Temp 98.2°F | Resp 16 | Wt 178.0 lb

## 2018-12-23 DIAGNOSIS — Z823 Family history of stroke: Secondary | ICD-10-CM | POA: Diagnosis not present

## 2018-12-23 DIAGNOSIS — R739 Hyperglycemia, unspecified: Secondary | ICD-10-CM

## 2018-12-23 DIAGNOSIS — Z23 Encounter for immunization: Secondary | ICD-10-CM | POA: Diagnosis not present

## 2018-12-23 DIAGNOSIS — Z87891 Personal history of nicotine dependence: Secondary | ICD-10-CM | POA: Insufficient documentation

## 2018-12-23 DIAGNOSIS — Z79899 Other long term (current) drug therapy: Secondary | ICD-10-CM | POA: Insufficient documentation

## 2018-12-23 DIAGNOSIS — E785 Hyperlipidemia, unspecified: Secondary | ICD-10-CM

## 2018-12-23 DIAGNOSIS — I10 Essential (primary) hypertension: Secondary | ICD-10-CM

## 2018-12-23 DIAGNOSIS — M170 Bilateral primary osteoarthritis of knee: Secondary | ICD-10-CM

## 2018-12-23 DIAGNOSIS — Z803 Family history of malignant neoplasm of breast: Secondary | ICD-10-CM | POA: Insufficient documentation

## 2018-12-23 DIAGNOSIS — N289 Disorder of kidney and ureter, unspecified: Secondary | ICD-10-CM

## 2018-12-23 DIAGNOSIS — Z885 Allergy status to narcotic agent status: Secondary | ICD-10-CM | POA: Diagnosis not present

## 2018-12-23 MED ORDER — AMLODIPINE BESYLATE 5 MG PO TABS: 5.0000 mg | ORAL_TABLET | Freq: Every day | ORAL | 5 refills | Status: DC

## 2018-12-23 MED ORDER — FENOFIBRATE 48 MG PO TABS: 48.0000 mg | ORAL_TABLET | Freq: Every day | ORAL | 1 refills | Status: DC

## 2018-12-23 MED ORDER — DULOXETINE HCL 20 MG PO CPEP: 20.0000 mg | ORAL_CAPSULE | Freq: Every day | ORAL | 6 refills | Status: DC

## 2018-12-23 NOTE — Patient Instructions (Addendum)
Please give patient an appointment with the clinical pharmacist [facility] in 2 weeks for repeat blood pressure check.  Blood pressure is not at goal of 130/80 or lower.  We have added another blood pressure medication called amlodipine.  He will take the amlodipine 5 mg once a day.  I have referred you to orthopedics for your knees.  We have added a medication called Cymbalta that you will take at bedtime that will help with arthritis pain.  Please go to [facility] radiology department to have x-rays done on your knees. Influenza Virus Vaccine injection (Fluarix) What is this medicine? INFLUENZA VIRUS VACCINE (in floo EN zuh VAHY ruhs vak SEEN) helps to reduce the risk of getting influenza also known as the flu. This medicine may be used for other purposes; ask your health care provider or pharmacist if you have questions. COMMON BRAND NAME(S): Fluarix, Fluzone What should I tell my health care provider before I take this medicine? They need to know if you have any of these conditions:  bleeding disorder like hemophilia  fever or infection  Guillain-Barre syndrome or other neurological problems  immune system problems  infection with the human immunodeficiency virus (HIV) or AIDS  low blood platelet counts  multiple sclerosis  an unusual or allergic reaction to influenza virus vaccine, eggs, chicken proteins, latex, gentamicin, other medicines, foods, dyes or preservatives  pregnant or trying to get pregnant  breast-feeding How should I use this medicine? This vaccine is for injection into a muscle. It is given by a health care professional. A copy of Vaccine Information Statements will be given before each vaccination. Read this sheet carefully each time. The sheet may change frequently. Talk to your pediatrician regarding the use of this medicine in children. Special care may be needed. Overdosage: If you think you have taken too much of this medicine contact a poison control  center or emergency room at once. NOTE: This medicine is only for you. Do not share this medicine with others. What if I miss a dose? This does not apply. What may interact with this medicine?  chemotherapy or radiation therapy  medicines that lower your immune system like etanercept, anakinra, infliximab, and adalimumab  medicines that treat or prevent blood clots like warfarin  phenytoin  steroid medicines like prednisone or cortisone  theophylline  vaccines This list may not describe all possible interactions. Give your health care provider a list of all the medicines, herbs, non-prescription drugs, or dietary supplements you use. Also tell them if you smoke, drink alcohol, or use illegal drugs. Some items may interact with your medicine. What should I watch for while using this medicine? Report any side effects that do not go away within 3 days to your doctor or health care professional. Call your health care provider if any unusual symptoms occur within 6 weeks of receiving this vaccine. You may still catch the flu, but the illness is not usually as bad. You cannot get the flu from the vaccine. The vaccine will not protect against colds or other illnesses that may cause fever. The vaccine is needed every year. What side effects may I notice from receiving this medicine? Side effects that you should report to your doctor or health care professional as soon as possible:  allergic reactions like skin rash, itching or hives, swelling of the face, lips, or tongue Side effects that usually do not require medical attention (report to your doctor or health care professional if they continue or are bothersome):  fever  headache  muscle aches and pains  pain, tenderness, redness, or swelling at site where injected  weak or tired This list may not describe all possible side effects. Call your doctor for medical advice about side effects. You may report side effects to FDA at  1-800-FDA-1088. Where should I keep my medicine? This vaccine is only given in a clinic, pharmacy, doctor's office, or other health care setting and will not be stored at home. NOTE: This sheet is a summary. It may not cover all possible information. If you have questions about this medicine, talk to your doctor, pharmacist, or health care provider.  2020 Elsevier/Gold Standard (2007-10-26 09:30:40)

## 2018-12-23 NOTE — Progress Notes (Signed)
Patient ID: Shannon [name], female    DOB: Jul 13, 1955  MRN: [id number]  CC: Hypertension   Subjective: Shannon [name] is a 63 y.o. female who presents for chronic ds management Her concerns today include:  Patient with history of HTN, chronic lower back pain ( hx of L4-5 decompression surgery), HL,ETOH use disorder, OA knees.  OA: c/o pain in knees RT>>LT.  Interfers with sleep at nights.  "I just want to walk." -was seeing an ortho specialist in past and had steroid inj to LT knee which helped -no recent falls. Ambulates with cane and uses motorized chair at home -had surgery on LT leg when she fx femur in 2018 -taking Meloxicam daily but does not seem to help as much especially at nights  HYPERTENSION Currently taking: see medication list Med Adherence: _0  Yes    _1  No Medication side effects: _2  Yes    _3  No Adherence with salt restriction: _4  Yes    _5  No Home Monitoring?: _6  Yes    _7  No Monitoring Frequency: _8  Yes    _9  No Home BP results range: _10  Yes    _11  No SOB? _12  Yes    _13  No Chest Pain?: _14  Yes    _15  No Leg swelling?: _16  Yes    _17  No Headaches?: _18  Yes    _19  No Dizziness? _20  Yes    _21  No Comments:   HL:  Taking Tricor  ETOH:  "I still drink but not as much any more."  Drinks a 16 oz can of beer a day.   Renal insuff and elev Ca+: She was noted to have some renal insufficiency on BMP earlier this year.  At that time she was taking them over-the-counter NSAID.  I had her stop taking that and just continue the meloxicam.  Follow-up BMP revealed eGFR improved to 61 but she was noted to have elevated calcium level of 10.6.  Had requested that she return to the lab to have additional blood test done including TSH and parathyroid hormone level but she did not come back.  She is willing to have it done today.  Patient Active Problem List   Diagnosis Date Noted  . Influenza vaccine needed 12/23/2018  . Renal insufficiency 08/22/2018  . Obesity  (BMI 30-39.9) 01/15/2017  . Alcohol use disorder, mild, abuse 01/15/2017  . Closed fracture of left distal femur ([place]) 09/03/2016  . Osteoarthritis of left knee 05/02/2015  . Postlaminectomy syndrome, lumbar region 11/09/2013  . Lumbosacral spondylosis without myelopathy 11/09/2013  . Dyslipidemia 10/11/2013  . Ventral hernia 05/19/2013  . DEPRESSION 06/13/2010  . DIVERTICULOSIS, COLON 07/21/2009  . SYPHILIS 05/14/2009  . DYSHIDROTIC ECZEMA, HANDS 11/06/2008  . Chronic low back pain 11/06/2008  . FIBROIDS, UTERUS 11/17/2007  . Essential hypertension 11/17/2007     Current Outpatient Medications on File Prior to Visit  Medication Sig Dispense Refill  . fluticasone (FLONASE) 50 MCG/ACT nasal spray Place 2 sprays into both nostrils daily. 16 g 0  . hydrochlorothiazide (HYDRODIURIL) 12.5 MG tablet Take 1 tablet (12.5 mg total) by mouth daily. 90 tablet 1  . lisinopril (ZESTRIL) 40 MG tablet Take 1 tablet (40 mg total) by mouth daily. 90 tablet 1  . meloxicam (MOBIC) 15 MG tablet Take 1 tablet (15 mg total) by mouth daily. 90 tablet 1  . metoprolol succinate (TOPROL-XL) 100 MG 24 hr tablet Take 1 tablet (100 mg total) by mouth daily. Take with or immediately following a meal. 90 tablet 1  No current facility-administered medications on file prior to visit.     Allergies  Allergen Reactions  . Morphine And Related     Itch    Social History   Socioeconomic History  . Marital status: Legally Separated    Spouse name: Not on file  . Number of children: Not on file  . Years of education: Not on file  . Highest education level: Not on file  Occupational History  . Not on file  Social Needs  . Financial resource strain: Not on file  . Food insecurity    Worry: Not on file    Inability: Not on file  . Transportation needs    Medical: Not on file    Non-medical: Not on file  Tobacco Use  . Smoking status: Former Research scientist (life sciences)  . Smokeless tobacco: Never Used  Substance and Sexual  Activity  . Alcohol use: Yes    Alcohol/week: 2.0 standard drinks    Types: 2 Cans of beer per week  . Drug use: No  . Sexual activity: Yes    Birth control/protection: Surgical  Lifestyle  . Physical activity    Days per week: Not on file    Minutes per session: Not on file  . Stress: Not on file  Relationships  . Social Herbalist on phone: Not on file    Gets together: Not on file    Attends religious service: Not on file    Active member of club or organization: Not on file    Attends meetings of clubs or organizations: Not on file    Relationship status: Not on file  . Intimate partner violence    Fear of current or ex partner: Not on file    Emotionally abused: Not on file    Physically abused: Not on file    Forced sexual activity: Not on file  Other Topics Concern  . Not on file  Social History Narrative   ** Merged History Encounter **        Family History  Problem Relation Age of Onset  . Stroke Mother   . Breast cancer Sister     Past Surgical History:  Procedure Laterality Date  . ABDOMINAL HYSTERECTOMY    . BACK SURGERY  09/01/2010   L4-5 lumbar decompression including laminotomy, facetectomy,   . colonscopy    . FEMUR IM NAIL Left 09/05/2016  . FEMUR IM NAIL Left 09/05/2016   Procedure: INTRAMEDULLARY (IM) NAIL FEMORAL;  Surgeon: Nicholes Stairs, MD;  Location: [facility];  Service: Orthopedics;  Laterality: Left;  . FRACTURE SURGERY     left leg  . INSERTION OF MESH N/A 05/24/2013   Procedure: INSERTION OF MESH;  Surgeon: Imogene Burn. Georgette Dover, MD;  Location: [facility];  Service: General;  Laterality: N/A;  . UTERINE FIBROID SURGERY    . VENTRAL HERNIA REPAIR N/A 05/24/2013   Procedure: OPEN REPAIR EPIGASTRIC VENTRAL HERNIA;  Surgeon: Imogene Burn. Tsuei, MD;  Location: [facility];  Service: General;  Laterality: N/A;    ROS: Review of Systems Negative except as stated above  PHYSICAL EXAM: BP (!) 140/100   Pulse (!) 52   Temp 98.2 F (36.8 C)  (Oral)   Resp 16   Wt 178 lb (80.7 kg)   SpO2 99%   BMI 30.55 kg/m   Physical Exam  General appearance - alert, well appearing, and in no distress Mental status - normal mood, behavior, speech, dress, motor activity, and thought processes  Mouth - mucous membranes moist, pharynx normal without lesions Neck - supple, no significant adenopathy Chest - clear to auscultation, no wheezes, rales or rhonchi, symmetric air entry Heart - normal rate, regular rhythm, normal S1, S2, no murmurs, rubs, clicks or gallops Musculoskeletal -patient ambulates with a cane.  Gait is slow.  Knees: She has mild enlargement of the right knee joint.  No point tenderness.  Slow range of motion.  No crepitus felt.  No enlargement of the left knee joint.  She had slowed passive range of motion Extremities -no lower extremity edema   CMP Latest Ref Rng & Units 09/02/2018 06/02/2018 11/12/2017  Glucose 65 - 99 mg/dL 113(H) 91 100(H)  BUN 8 - 27 mg/dL _0 Creatinine 0.57 - 1.00 mg/dL 1.13(H) 1.18(H) 0.91  Sodium 134 - 144 mmol/L 145(H) 142 146(H)  Potassium 3.5 - 5.2 mmol/L 4.5 4.9 3.8  Chloride 96 - 106 mmol/L 103 106 107(H)  CO2 20 - 29 mmol/L _1 Calcium 8.7 - 10.3 mg/dL 10.6(H) 9.8 9.6  Total Protein 6.0 - 8.5 g/dL - - 7.0  Total Bilirubin 0.0 - 1.2 mg/dL - - 0.4  Alkaline Phos 39 - 117 IU/L - - 49  AST 0 - 40 IU/L - - 24  ALT 0 - 32 IU/L - - 24   Lipid Panel     Component Value Date/Time   CHOL 153 11/12/2017 0920   TRIG 73 11/12/2017 0920   HDL 58 11/12/2017 0920   CHOLHDL 2.6 11/12/2017 0920   CHOLHDL 2.9 05/31/2014 1457   VLDL 17 05/31/2014 1457   LDLCALC 80 11/12/2017 0920    CBC    Component Value Date/Time   WBC 6.1 11/12/2017 0920   WBC 6.5 09/03/2016 1440   RBC 5.53 (H) 11/12/2017 0920   RBC 5.32 (H) 09/03/2016 1440   HGB 13.0 11/12/2017 0920   HCT 41.9 11/12/2017 0920   PLT 322 11/12/2017 0920   MCV 76 (L) 11/12/2017 0920   MCH 23.5 (L) 11/12/2017 0920   MCH 23.3 (L)  09/03/2016 1440   MCHC 31.0 (L) 11/12/2017 0920   MCHC 31.2 09/03/2016 1440   RDW 16.5 (H) 11/12/2017 0920   LYMPHSABS 1.9 09/03/2016 1440   MONOABS 0.2 09/03/2016 1440   EOSABS 0.1 09/03/2016 1440   BASOSABS 0.0 09/03/2016 1440    ASSESSMENT AND PLAN: 1. Essential hypertension Not at goal.  Continue metoprolol, lisinopril and HCTZ.  I have added low-dose of amlodipine.  Follow-up with clinical pharmacist in 2 weeks for repeat blood pressure check. - Basic metabolic panel - amLODipine (NORVASC) 5 MG tablet; Take 1 tablet (5 mg total) by mouth daily.  Dispense: 30 tablet; Refill: 5  2. Osteoarthritis of both knees, unspecified osteoarthritis type Continue meloxicam.  We have added low-dose Cymbalta for her to take at bedtime.  Weight loss encouraged - Ambulatory referral to Orthopedic Surgery - DG Knee Complete 4 Views Left; Future - DG Knee Complete 4 Views Right; Future - DULoxetine (CYMBALTA) 20 MG capsule; Take 1 capsule (20 mg total) by mouth at bedtime.  Dispense: 30 capsule; Refill: 6  3. Dyslipidemia - fenofibrate (TRICOR) 48 MG tablet; Take 1 tablet (48 mg total) by mouth daily.  Dispense: 90 tablet; Refill: 1  4. Renal insufficiency -Improved on repeat BMP.  5. Hypercalcemia - TSH - Intact PTH (Includes Calcium) - PTH-Related Peptide - VITAMIN D 25 Hydroxy (Vit-D Deficiency, Fractures)  6. Influenza vaccine needed Given    Patient was  given the opportunity to ask questions.  Patient verbalized understanding of the plan and was able to repeat key elements of the plan.   Orders Placed This Encounter  Procedures  . DG Knee Complete 4 Views Left  . DG Knee Complete 4 Views Right  . Basic metabolic panel  . TSH  . Intact PTH (Includes Calcium)  . PTH-Related Peptide  . VITAMIN D 25 Hydroxy (Vit-D Deficiency, Fractures)  . Ambulatory referral to Orthopedic Surgery     Requested Prescriptions   Signed Prescriptions Disp Refills  . DULoxetine (CYMBALTA) 20  MG capsule 30 capsule 6    Sig: Take 1 capsule (20 mg total) by mouth at bedtime.  Marland Kitchen amLODipine (NORVASC) 5 MG tablet 30 tablet 5    Sig: Take 1 tablet (5 mg total) by mouth daily.  . fenofibrate (TRICOR) 48 MG tablet 90 tablet 1    Sig: Take 1 tablet (48 mg total) by mouth daily.    Return in about 3 months (around 03/24/2019).  Karle Plumber, MD, FACP

## 2018-12-23 NOTE — Progress Notes (Signed)
Patient presents for vaccination against influenza per orders of Dr. Johnson. Consent given. Counseling provided. No contraindications exists. Vaccine administered without incident.   

## 2018-12-25 NOTE — Addendum Note (Signed)
Addended by: Karle Plumber B on: 12/25/2018 12:45 PM   Modules accepted: Orders

## 2018-12-26 ENCOUNTER — Ambulatory Visit (HOSPITAL_COMMUNITY)
Admission: RE | Admit: 2018-12-26 | Discharge: 2018-12-26 | Disposition: A | Discharge status: Home / Self Care | Payer: Medicare Other | Source: Ambulatory Visit | Attending: Internal Medicine | Admitting: Internal Medicine

## 2018-12-26 ENCOUNTER — Other Ambulatory Visit: Payer: Self-pay

## 2018-12-26 ENCOUNTER — Telehealth: Payer: Self-pay

## 2018-12-26 DIAGNOSIS — M1712 Unilateral primary osteoarthritis, left knee: Secondary | ICD-10-CM | POA: Diagnosis not present

## 2018-12-26 DIAGNOSIS — M1711 Unilateral primary osteoarthritis, right knee: Secondary | ICD-10-CM | POA: Diagnosis not present

## 2018-12-26 DIAGNOSIS — M170 Bilateral primary osteoarthritis of knee: Secondary | ICD-10-CM

## 2018-12-26 LAB — VITAMIN D 25 HYDROXY (VIT D DEFICIENCY, FRACTURES): Vit D, 25-Hydroxy: 29.3 ng/mL — ABNORMAL LOW (ref 30.0–100.0)

## 2018-12-26 LAB — SPECIMEN STATUS REPORT

## 2018-12-26 NOTE — Telephone Encounter (Signed)
Contacted pt to go over lab results pt is aware. Pt is schedule for a lab appointment for 9/24 for a1c

## 2018-12-27 ENCOUNTER — Telehealth: Payer: Self-pay

## 2018-12-27 NOTE — Telephone Encounter (Signed)
Contacted pt to go over xray results pt is aware and doesn't have any questions or concerns  

## 2018-12-29 ENCOUNTER — Ambulatory Visit (INDEPENDENT_AMBULATORY_CARE_PROVIDER_SITE_OTHER): Payer: Medicare Other | Admitting: Family Medicine

## 2018-12-29 ENCOUNTER — Encounter: Payer: Self-pay | Admitting: Family Medicine

## 2018-12-29 DIAGNOSIS — M1711 Unilateral primary osteoarthritis, right knee: Secondary | ICD-10-CM

## 2018-12-29 DIAGNOSIS — M1712 Unilateral primary osteoarthritis, left knee: Secondary | ICD-10-CM

## 2018-12-29 NOTE — Progress Notes (Signed)
Office Visit Note   Patient: Shannon [name]           Date of Birth: October 14, 1955           MRN: [id number] Visit Date: 12/29/2018 Requested by: Ladell Pier, MD [place],  [place] [id number] PCP: Ladell Pier, MD  Subjective: Chief Complaint  Patient presents with  . Right Knee - Pain    Chronic pain bilateral knees, right > left. Hurts/"stings" at night. Hurts with walking. Ambulates with cane or electric wheelchair (cane today). H/o cortisone injections left knee - they help.  . Left Knee - Pain    HPI: She is here with right greater than left knee pain.  Longstanding problems with her left knee, she has broken her leg several times and had extensive surgery in the past.  She got a cortisone injection a year or 2 ago with good results.  It is bothering her a little bit but not as much as the right one which started hurting recently.  She had x-rays obtained earlier this week which I reviewed, she has moderate patellofemoral DJD and mild to moderate medial compartment DJD.  Pain is mostly in the anterior knee.              ROS: No fevers or chills.  All other systems were reviewed and are negative.  Objective: Vital Signs: There were no vitals taken for this visit.  Physical Exam:  General:  Alert and oriented, in no acute distress. Pulm:  Breathing unlabored. Psy:  Normal mood, congruent affect. Skin: No erythema. Right knee: Trace effusion with no warmth.  2+ patellofemoral crepitus, mild pain with patella compression.  Tender on the lateral patellofemoral joint and on the medial joint line. Left knee: Old surgical scars well-healed.  Trace effusion with no warmth.  Good range of motion still, 2+ patellofemoral crepitus and minimal joint line tenderness today.  Imaging: None today.  Assessment & Plan: 1.  Right greater than left knee pain most likely due to osteoarthritis -Discussed options with her and she wants to try an injection in the  right knee.  Could inject the left knee later on when pain worsens.  Follow-up as needed.     Procedures: Right knee steroid injection: After sterile prep with Betadine, injected 5 cc 1% lidocaine without epinephrine and 40 mg methylprednisolone from lateral midpatellar approach.    PMFS History: Patient Active Problem List   Diagnosis Date Noted  . Influenza vaccine needed 12/23/2018  . Renal insufficiency 08/22/2018  . Obesity (BMI 30-39.9) 01/15/2017  . Alcohol use disorder, mild, abuse 01/15/2017  . Closed fracture of left distal femur ([place]) 09/03/2016  . Osteoarthritis of left knee 05/02/2015  . Postlaminectomy syndrome, lumbar region 11/09/2013  . Lumbosacral spondylosis without myelopathy 11/09/2013  . Dyslipidemia 10/11/2013  . Ventral hernia 05/19/2013  . DEPRESSION 06/13/2010  . DIVERTICULOSIS, COLON 07/21/2009  . SYPHILIS 05/14/2009  . DYSHIDROTIC ECZEMA, HANDS 11/06/2008  . Chronic low back pain 11/06/2008  . FIBROIDS, UTERUS 11/17/2007  . Essential hypertension 11/17/2007   Past Medical History:  Diagnosis Date  . Cataract 12/2017   Dr. Schuyler Amor  . Dizziness    occasionally  . Headache   . Hyperlipidemia    takes Tricor daily  . Hypertension    takes Lisinopril and Metoprolol daily  . Hypertensive retinopathy 12/2017   Dr. Schuyler Amor  . Numbness    stinging in right hand  . Peripheral edema  takes lasix daily  . Ventral hernia     Family History  Problem Relation Age of Onset  . Stroke Mother   . Breast cancer Sister     Past Surgical History:  Procedure Laterality Date  . ABDOMINAL HYSTERECTOMY    . BACK SURGERY  09/01/2010   L4-5 lumbar decompression including laminotomy, facetectomy,   . colonscopy    . FEMUR IM NAIL Left 09/05/2016  . FEMUR IM NAIL Left 09/05/2016   Procedure: INTRAMEDULLARY (IM) NAIL FEMORAL;  Surgeon: Nicholes Stairs, MD;  Location: [facility];  Service: Orthopedics;  Laterality: Left;  . FRACTURE SURGERY     left leg  .  INSERTION OF MESH N/A 05/24/2013   Procedure: INSERTION OF MESH;  Surgeon: Imogene Burn. Georgette Dover, MD;  Location: [facility];  Service: General;  Laterality: N/A;  . UTERINE FIBROID SURGERY    . VENTRAL HERNIA REPAIR N/A 05/24/2013   Procedure: OPEN REPAIR EPIGASTRIC VENTRAL HERNIA;  Surgeon: Imogene Burn. Georgette Dover, MD;  Location: [facility];  Service: General;  Laterality: N/A;   Social History   Occupational History  . Not on file  Tobacco Use  . Smoking status: Former Research scientist (life sciences)  . Smokeless tobacco: Never Used  Substance and Sexual Activity  . Alcohol use: Yes    Alcohol/week: 2.0 standard drinks    Types: 2 Cans of beer per week  . Drug use: No  . Sexual activity: Yes    Birth control/protection: Surgical

## 2019-01-05 ENCOUNTER — Ambulatory Visit: Payer: Medicare Other

## 2019-01-05 ENCOUNTER — Ambulatory Visit: Payer: Medicare Other | Attending: Internal Medicine | Admitting: Pharmacist

## 2019-01-05 ENCOUNTER — Other Ambulatory Visit: Payer: Self-pay

## 2019-01-05 ENCOUNTER — Encounter: Payer: Self-pay | Admitting: Pharmacist

## 2019-01-05 ENCOUNTER — Other Ambulatory Visit: Payer: Self-pay | Admitting: Internal Medicine

## 2019-01-05 VITALS — BP 113/75 | HR 59

## 2019-01-05 DIAGNOSIS — R739 Hyperglycemia, unspecified: Secondary | ICD-10-CM

## 2019-01-05 DIAGNOSIS — I10 Essential (primary) hypertension: Secondary | ICD-10-CM | POA: Diagnosis not present

## 2019-01-05 LAB — INTACT PTH (INCLUDES CALCIUM)
Calcium, Serum: 10.8 mg/dL — ABNORMAL HIGH
PTH (Intact Assay): 25 pg/mL

## 2019-01-05 LAB — BASIC METABOLIC PANEL
BUN/Creatinine Ratio: 22 (ref 12–28)
BUN: 24 mg/dL (ref 8–27)
CO2: 22 mmol/L (ref 20–29)
Calcium: 10.5 mg/dL — ABNORMAL HIGH (ref 8.7–10.3)
Chloride: 107 mmol/L — ABNORMAL HIGH (ref 96–106)
Creatinine, Ser: 1.07 mg/dL — ABNORMAL HIGH (ref 0.57–1.00)
GFR calc Af Amer: 65 mL/min/{1.73_m2} (ref >59)
GFR calc non Af Amer: 57 mL/min/{1.73_m2} — ABNORMAL LOW (ref >59)
Glucose: 108 mg/dL — ABNORMAL HIGH (ref 65–99)
Potassium: 4.8 mmol/L (ref 3.5–5.2)
Sodium: 146 mmol/L — ABNORMAL HIGH (ref 134–144)

## 2019-01-05 LAB — PTH-RELATED PEPTIDE: PTH-related peptide: <2 pmol/L

## 2019-01-05 LAB — TSH: TSH: 0.464 u[IU]/mL (ref 0.450–4.500)

## 2019-01-05 NOTE — Patient Instructions (Signed)
Thank you for coming to see [place] today.   Blood pressure today is well-controlled.  Continue taking blood pressure medications as prescribed.   Pick up the amlodipine but don't take it until I see  You next week to check you BP.   Make an appointment to see me next week.   Limiting salt and caffeine, as well as exercising as able for at least 30 minutes for 5 days out of the week, can also help you lower your blood pressure.  Take your blood pressure at home if you are able. Please write down these numbers and bring them to your visits.  If you have any questions about medications, please call me [phone].  Lurena Joiner

## 2019-01-05 NOTE — Progress Notes (Signed)
   S:    PCP: Dr. Wynetta Emery  Patient arrives in good spirits. Presents to the clinic for BP check.  Patient was referred and last seen by Primary Care Provider on 12/23/18. Dr. Wynetta Emery added amlodipine at that visit.   Patient denies adherence with medications. She has not yet picked up her amlodipine.   Patient denies chest pain, dyspnea, blurred vision. Endorses occasional HA and swelling in her knees d/t arthritic pain/changes.   Current BP Medications include:  Amlodipine 5 mg daily; HCTZ 12.5 mg daily, lisinopril 40 mg daily, Toprol XL 100 mg daily  Dietary habits include: compliant with salt restriction; denies drinking caffeine  Exercise habits include: denies  Family / Social history:  - FHx: stroke (mother) - Tobacco: never smoker   - Alcohol: drinks 2 beers daily  O:  Home BP readings: does not have device to check BP at home   Last 3 Office BP readings: BP Readings from Last 3 Encounters:  01/05/19 113/75  12/23/18 (!) 140/100  07/18/18 117/82   BMET    Component Value Date/Time   NA 146 (H) 12/23/2018 1028   K 4.8 12/23/2018 1028   CL 107 (H) 12/23/2018 1028   CO2 22 12/23/2018 1028   GLUCOSE 108 (H) 12/23/2018 1028   GLUCOSE 109 (H) 09/03/2016 1440   BUN 24 12/23/2018 1028   CREATININE 1.07 (H) 12/23/2018 1028   CREATININE 0.90 05/02/2015 1608   CALCIUM 10.5 (H) 12/23/2018 1028   GFRNONAA 57 (L) 12/23/2018 1028   GFRNONAA 54 (L) 09/27/2014 1152   GFRAA 65 12/23/2018 1028   GFRAA 63 09/27/2014 1152   Renal function: CrCl cannot be calculated (Unknown ideal weight.).  Clinical ASCVD: No  The 10-year ASCVD risk score Mikey Bussing DC Jr., et al., 2013) is: 7.1%   Values used to calculate the score:     Age: 63 years     Sex: Female     Is Non-Hispanic African American: Yes     Diabetic: No     Tobacco smoker: Yes     Systolic Blood Pressure: 123456 mmHg     Is BP treated: Yes     HDL Cholesterol: 58 mg/dL     Total Cholesterol: 153 mg/dL   A/P:  Hypertension longstanding currently at goal on current medications. BP Goal = <130/80 mmHg. Patient is not adherent with amlodipine but is taking all other anti-hypertensive medications. I have asked her to pick up amlodipine but to not start it given today's BP. I will re-check in 1 week to see if she still needs the amlodipine. -Continued other medications.  -Counseled on lifestyle modifications for blood pressure control including reduced dietary sodium, increased exercise, adequate sleep  Results reviewed and written information provided.   Total time in face-to-face counseling 15 minutes.   F/U Clinic Visit in 1 week.    Patient seen with: Dillard Essex PharmD Candidate  Class of 2022 [place], PharmD, [place] [phone]

## 2019-01-06 LAB — BASIC METABOLIC PANEL
BUN/Creatinine Ratio: 13 (ref 12–28)
BUN: 14 mg/dL (ref 8–27)
CO2: 24 mmol/L (ref 20–29)
Calcium: 10.1 mg/dL (ref 8.7–10.3)
Chloride: 109 mmol/L — ABNORMAL HIGH (ref 96–106)
Creatinine, Ser: 1.06 mg/dL — ABNORMAL HIGH (ref 0.57–1.00)
GFR calc Af Amer: 66 mL/min/{1.73_m2} (ref >59)
GFR calc non Af Amer: 57 mL/min/{1.73_m2} — ABNORMAL LOW (ref >59)
Glucose: 101 mg/dL — ABNORMAL HIGH (ref 65–99)
Potassium: 4.4 mmol/L (ref 3.5–5.2)
Sodium: 146 mmol/L — ABNORMAL HIGH (ref 134–144)

## 2019-01-06 LAB — HEMOGLOBIN A1C
Est. average glucose Bld gHb Est-mCnc: 117 mg/dL
Hgb A1c MFr Bld: 5.7 % — ABNORMAL HIGH (ref 4.8–5.6)

## 2019-01-12 ENCOUNTER — Other Ambulatory Visit: Payer: Self-pay

## 2019-01-12 ENCOUNTER — Ambulatory Visit: Payer: Medicare Other | Attending: Internal Medicine | Admitting: Pharmacist

## 2019-01-12 ENCOUNTER — Encounter: Payer: Self-pay | Admitting: Pharmacist

## 2019-01-12 VITALS — BP 140/82 | HR 55

## 2019-01-12 DIAGNOSIS — Z79899 Other long term (current) drug therapy: Secondary | ICD-10-CM | POA: Insufficient documentation

## 2019-01-12 DIAGNOSIS — I10 Essential (primary) hypertension: Secondary | ICD-10-CM | POA: Diagnosis not present

## 2019-01-12 DIAGNOSIS — M170 Bilateral primary osteoarthritis of knee: Secondary | ICD-10-CM | POA: Diagnosis not present

## 2019-01-12 DIAGNOSIS — R519 Headache, unspecified: Secondary | ICD-10-CM | POA: Diagnosis not present

## 2019-01-12 NOTE — Progress Notes (Signed)
   S:    PCP: Dr. Wynetta Emery  Patient arrives in good spirits. Presents to the clinic for BP check.  Patient was referred and last seen by Primary Care Provider on 12/23/18. I saw her on 9/24 and instructed her to hold her amlodipine as she reported not taking it before that visit. Her BP with me on that day was at goal.   Patient denies adherence with amlodipine but is taking her other medications.   Patient denies chest pain, dyspnea, blurred vision. Endorses occasional HA and swelling in her knees d/t arthritic pain/changes.   Current BP Medications include:  Amlodipine 5 mg daily (holding); HCTZ 12.5 mg daily, lisinopril 40 mg daily, Toprol XL 100 mg daily  Dietary habits include: compliant with salt restriction; denies drinking caffeine  Exercise habits include: denies  Family / Social history:  - FHx: stroke (mother) - Tobacco: never smoker   - Alcohol: drinks 2 beers daily  O:  Home BP readings: does not have device to check BP at home   Last 3 Office BP readings: BP Readings from Last 3 Encounters:  01/12/19 140/82  01/05/19 113/75  12/23/18 (!) 140/100   BMET    Component Value Date/Time   NA 146 (H) 01/05/2019 1022   K 4.4 01/05/2019 1022   CL 109 (H) 01/05/2019 1022   CO2 24 01/05/2019 1022   GLUCOSE 101 (H) 01/05/2019 1022   GLUCOSE 109 (H) 09/03/2016 1440   BUN 14 01/05/2019 1022   CREATININE 1.06 (H) 01/05/2019 1022   CREATININE 0.90 05/02/2015 1608   CALCIUM 10.1 01/05/2019 1022   GFRNONAA 57 (L) 01/05/2019 1022   GFRNONAA 54 (L) 09/27/2014 1152   GFRAA 66 01/05/2019 1022   GFRAA 63 09/27/2014 1152   Renal function: CrCl cannot be calculated (Unknown ideal weight.).  Clinical ASCVD: No  The 10-year ASCVD risk score Mikey Bussing DC Jr., et al., 2013) is: 12.9%   Values used to calculate the score:     Age: 63 years     Sex: Female     Is Non-Hispanic African American: Yes     Diabetic: No     Tobacco smoker: Yes     Systolic Blood Pressure: [id number] mmHg  Is BP treated: Yes     HDL Cholesterol: 58 mg/dL     Total Cholesterol: 153 mg/dL   A/P: Hypertension longstanding currently at goal on current medications. BP Goal = <130/80 mmHg. Patient is not adherent with amlodipine but is taking all other anti-hypertensive medications. We discussed the possibility of starting the amlodipine at her last visit if today's BP was elevated. She is above goal, but we need to proceed with caution d/t increased risk given her knee pain. I have instructed her to start the amlodipine but take at night. She knows to stop and contact me should any s/sx of hypotension present.  -Start amlodipine 5 mg daily.  -Continued other medications.  -Counseled on lifestyle modifications for blood pressure control including reduced dietary sodium, increased exercise, adequate sleep  Results reviewed and written information provided.   Total time in face-to-face counseling 15 minutes.   F/U Clinic Visit in 1 week.    Benard Halsted, PharmD, [place] [phone]

## 2019-01-12 NOTE — Patient Instructions (Addendum)
Start amlodipine as directed by Dr. Wynetta Emery. Take this before bedtime. Stop it and call me or the clinic if you feel lightheaded or dizzy after starting it.   Keep taking your other medications as well.   If you can, please come see me at the beginning of next month so I can check your blood pressure again.

## 2019-01-16 ENCOUNTER — Encounter: Payer: Self-pay | Admitting: Family Medicine

## 2019-01-16 ENCOUNTER — Other Ambulatory Visit: Payer: Self-pay

## 2019-01-16 ENCOUNTER — Ambulatory Visit (INDEPENDENT_AMBULATORY_CARE_PROVIDER_SITE_OTHER): Payer: Medicare Other

## 2019-01-16 ENCOUNTER — Ambulatory Visit (INDEPENDENT_AMBULATORY_CARE_PROVIDER_SITE_OTHER): Payer: Medicare Other | Admitting: Family Medicine

## 2019-01-16 DIAGNOSIS — M5431 Sciatica, right side: Secondary | ICD-10-CM

## 2019-01-16 MED ORDER — GABAPENTIN 300 MG PO CAPS: ORAL_CAPSULE | ORAL | 3 refills | Status: DC

## 2019-01-16 NOTE — Progress Notes (Signed)
Shannon [name] - 63 y.o. female MRN [id number]  Date of birth: [birth year]  Office Visit Note: Visit Date: 01/16/2019 PCP: Ladell Pier, MD Referred by: Ladell Pier, MD  Subjective: Chief Complaint  Patient presents with  . Right Leg - Pain    Aching pain in the whole leg. Cannot sleep well at night. Knee continues to hurt - no improvement after cortisone injection on 12/26/18. Worse pain is in the posterior thigh and the lower leg. Ambulating with cane today.   HPI: Shannon [name] is a 63 y.o. female who comes in today with right leg pain extending from buttocks down leg. She reports that she has achy pain down leg majority of the time, worse with walking. It is interfering with her sleep at night. She is now ambulating with a cane.   She was seen on 9/14, given a cortisone injection in right knee due to primary complaint of knee pain, found to have DJD in bilateral knees.  She had L4-L5 lumbar decompression, posteiror lumbar interbody fusion, posterolateral arthrodesis in 2012 by Dr. Rita Ohara.   ROS Otherwise per HPI.  Assessment & Plan: Visit Diagnoses:  1. Sciatica, right side     Plan:  - will trial gabapentin for radiculopathic pain - referral for epidural steroid injection per patient request  - if no improvement, will consider MRI and will also try PT  Meds & Orders:  Meds ordered this encounter  Medications  . gabapentin (NEURONTIN) 300 MG capsule    Sig: 1 PO q HS, may increase to 1 PO TID if needed/tolerated    Dispense:  90 capsule    Refill:  3    Orders Placed This Encounter  Procedures  . XR Lumbar Spine Complete  . Ambulatory referral to Physical Medicine Rehab    Follow-up: No follow-ups on file.   Procedures: No procedures performed  No notes on file   Clinical History: No specialty comments available.   She reports that she has quit smoking. She has never used smokeless tobacco.  Recent Labs    01/05/19 1022   HGBA1C 5.7*    Objective:  VS:  HT:    WT:   BMI:     BP:   HR: bpm  TEMP: ( )  RESP:  Physical Exam  PHYSICAL EXAM: Gen: NAD, alert, cooperative with exam, well-appearing HEENT: clear conjunctiva,  CV:  no edema, capillary refill brisk, normal rate Resp: non-labored Skin: no rashes, normal turgor  Neuro: no gross deficits.  Psych:  alert and oriented  Ortho Exam  Lumbar spine: - Inspection: no gross deformity or asymmetry, swelling or ecchymosis - Palpation: TTP over L4-S1 spinous processes and R SI joint - ROM: full flexion of spine with limited extension causing pain  - Strength: 5/5 strength of lower extremity in L4-S1 nerve root distributions b/l; normal gait - Neuro: sensation intact in the L4-S1 nerve root distribution b/l, 2+ L4 and S1 reflexes - Special testing: Positive straight leg raise, negative slump, Positive Stork test on R   Imaging: Lumbar x rays: stable L4-L5 fusion, L3-L4 anterolisthesis, moderate degenerative disc at L5-S1  Past Medical/Family/Surgical/Social History: Medications & Allergies reviewed per EMR, new medications updated. Patient Active Problem List   Diagnosis Date Noted  . Influenza vaccine needed 12/23/2018  . Renal insufficiency 08/22/2018  . Obesity (BMI 30-39.9) 01/15/2017  . Alcohol use disorder, mild, abuse 01/15/2017  . Closed fracture of left distal femur ([place]) 09/03/2016  . Osteoarthritis of left  knee 05/02/2015  . Postlaminectomy syndrome, lumbar region 11/09/2013  . Lumbosacral spondylosis without myelopathy 11/09/2013  . Dyslipidemia 10/11/2013  . Ventral hernia 05/19/2013  . DEPRESSION 06/13/2010  . DIVERTICULOSIS, COLON 07/21/2009  . SYPHILIS 05/14/2009  . DYSHIDROTIC ECZEMA, HANDS 11/06/2008  . Chronic low back pain 11/06/2008  . FIBROIDS, UTERUS 11/17/2007  . Essential hypertension 11/17/2007   Past Medical History:  Diagnosis Date  . Cataract 12/2017   Dr. Schuyler Amor  . Dizziness    occasionally  . Headache    . Hyperlipidemia    takes Tricor daily  . Hypertension    takes Lisinopril and Metoprolol daily  . Hypertensive retinopathy 12/2017   Dr. Schuyler Amor  . Numbness    stinging in right hand  . Peripheral edema    takes lasix daily  . Ventral hernia    Family History  Problem Relation Age of Onset  . Stroke Mother   . Breast cancer Sister    Past Surgical History:  Procedure Laterality Date  . ABDOMINAL HYSTERECTOMY    . BACK SURGERY  09/01/2010   L4-5 lumbar decompression including laminotomy, facetectomy,   . colonscopy    . FEMUR IM NAIL Left 09/05/2016  . FEMUR IM NAIL Left 09/05/2016   Procedure: INTRAMEDULLARY (IM) NAIL FEMORAL;  Surgeon: Nicholes Stairs, MD;  Location: [facility];  Service: Orthopedics;  Laterality: Left;  . FRACTURE SURGERY     left leg  . INSERTION OF MESH N/A 05/24/2013   Procedure: INSERTION OF MESH;  Surgeon: Imogene Burn. Georgette Dover, MD;  Location: [facility];  Service: General;  Laterality: N/A;  . UTERINE FIBROID SURGERY    . VENTRAL HERNIA REPAIR N/A 05/24/2013   Procedure: OPEN REPAIR EPIGASTRIC VENTRAL HERNIA;  Surgeon: Imogene Burn. Georgette Dover, MD;  Location: [facility];  Service: General;  Laterality: N/A;   Social History   Occupational History  . Not on file  Tobacco Use  . Smoking status: Former Research scientist (life sciences)  . Smokeless tobacco: Never Used  Substance and Sexual Activity  . Alcohol use: Yes    Alcohol/week: 2.0 standard drinks    Types: 2 Cans of beer per week  . Drug use: No  . Sexual activity: Yes    Birth control/protection: Surgical

## 2019-01-16 NOTE — Progress Notes (Signed)
I saw and examined the patient with Dr. Mayer Masker and agree with assessment and plan as outlined.  No improvement with knee injection.  Pain pattern now consistent with sciatica.    X-Rays today show stable L4-5 fusion, stable mild L3-4 anterolisthesis, and mild-moderate L5-S1 DDD.  Will try gabapentin, refer for lumbar ESI.  If no relief, then possibly PT, MRI.

## 2019-01-30 ENCOUNTER — Telehealth: Payer: Self-pay | Admitting: Internal Medicine

## 2019-01-30 NOTE — Telephone Encounter (Signed)
Patient called stating they would like to speak to someone in regards to seeing a nutritionist specialist. Please follow up.

## 2019-01-30 NOTE — Telephone Encounter (Signed)
Why is pt needing a referral to the nutritionist

## 2019-02-09 ENCOUNTER — Ambulatory Visit: Payer: Self-pay

## 2019-02-09 ENCOUNTER — Other Ambulatory Visit: Payer: Self-pay

## 2019-02-09 ENCOUNTER — Ambulatory Visit (INDEPENDENT_AMBULATORY_CARE_PROVIDER_SITE_OTHER): Payer: Medicare Other | Admitting: Physical Medicine and Rehabilitation

## 2019-02-09 ENCOUNTER — Encounter: Payer: Self-pay | Admitting: Physical Medicine and Rehabilitation

## 2019-02-09 VITALS — BP 142/97 | HR 96

## 2019-02-09 DIAGNOSIS — M5416 Radiculopathy, lumbar region: Secondary | ICD-10-CM

## 2019-02-09 MED ORDER — BETAMETHASONE SOD PHOS & ACET 6 (3-3) MG/ML IJ SUSP
12.0000 mg | Freq: Once | INTRAMUSCULAR | Status: AC
  Administered 2019-02-09: 12 mg

## 2019-02-09 NOTE — Progress Notes (Signed)
 .  Numeric Pain Rating Scale and Functional Assessment Average Pain 10   In the last MONTH (on 0-10 scale) has pain interfered with the following?  1. General activity like being  able to carry out your everyday physical activities such as walking, climbing stairs, carrying groceries, or moving a chair?  Rating(10)   +Driver, -BT, -Dye Allergies.           [phone]

## 2019-02-09 NOTE — Progress Notes (Signed)
Shannon [name] - 63 y.o. female MRN [id number]  Date of birth: [birth year]  Office Visit Note: Visit Date: 02/09/2019 PCP: Ladell Pier, MD Referred by: Ladell Pier, MD  Subjective: Chief Complaint  Patient presents with  . Lower Back - Pain  . Right Leg - Pain   HPI: Shannon [name] is a 63 y.o. female who comes in today For planned right L5 transforaminal injection diagnostically medically therapeutically.  She is followed by Dr. Legrand Como hilts for requesting injection.  She has had prior lumbar fusion at L4-5 with chronic 6 months of right radicular leg pain.  We will complete right L5 injection.  If she does not get long-term relief at all would look at MRI of the lumbar spine to review adjacent level stenotic changes versus disc problems.  ROS Otherwise per HPI.  Assessment & Plan: Visit Diagnoses:  1. Lumbar radiculopathy     Plan: No additional findings.   Meds & Orders:  Meds ordered this encounter  Medications  . betamethasone acetate-betamethasone sodium phosphate (CELESTONE) injection 12 mg    Orders Placed This Encounter  Procedures  . XR C-ARM NO REPORT  . Epidural Steroid injection    Follow-up: No follow-ups on file.   Procedures: No procedures performed  Lumbosacral Transforaminal Epidural Steroid Injection - Sub-Pedicular Approach with Fluoroscopic Guidance  Patient: Shannon [name]      Date of Birth: 02-24-1956 MRN: [id number] PCP: Ladell Pier, MD      Visit Date: 02/09/2019   Universal Protocol:    Date/Time: 02/09/2019  Consent Given By: the patient  Position: PRONE  Additional Comments: Vital signs were monitored before and after the procedure. Patient was prepped and draped in the usual sterile fashion. The correct patient, procedure, and site was verified.   Injection Procedure Details:  Procedure Site One Meds Administered:  Meds ordered this encounter  Medications  . betamethasone  acetate-betamethasone sodium phosphate (CELESTONE) injection 12 mg    Laterality: Right  Location/Site:  L5-S1  Needle size: 22 G  Needle type: Spinal  Needle Placement: Transforaminal  Findings:    -Comments: Excellent flow of contrast along the nerve and into the epidural space.  Procedure Details: After squaring off the end-plates to get a true AP view, the C-arm was positioned so that an oblique view of the foramen as noted above was visualized. The target area is just inferior to the "nose of the scotty dog" or sub pedicular. The soft tissues overlying this structure were infiltrated with 2-3 ml. of 1% Lidocaine without Epinephrine.  The spinal needle was inserted toward the target using a "trajectory" view along the fluoroscope beam.  Under AP and lateral visualization, the needle was advanced so it did not puncture dura and was located close the 6 O'Clock position of the pedical in AP tracterory. Biplanar projections were used to confirm position. Aspiration was confirmed to be negative for CSF and/or blood. A 1-2 ml. volume of Isovue-250 was injected and flow of contrast was noted at each level. Radiographs were obtained for documentation purposes.   After attaining the desired flow of contrast documented above, a 0.5 to 1.0 ml test dose of 0.25% Marcaine was injected into each respective transforaminal space.  The patient was observed for 90 seconds post injection.  After no sensory deficits were reported, and normal lower extremity motor function was noted,   the above injectate was administered so that equal amounts of the injectate were placed at each foramen (level)  into the transforaminal epidural space.   Additional Comments:  The patient tolerated the procedure well Dressing: 2 x 2 sterile gauze and Band-Aid    Post-procedure details: Patient was observed during the procedure. Post-procedure instructions were reviewed.  Patient left the clinic in stable condition.      Clinical History: No specialty comments available.   She reports that she has quit smoking. She has never used smokeless tobacco.  Recent Labs    01/05/19 1022  HGBA1C 5.7*    Objective:  VS:  HT:    WT:   BMI:     BP:(!) 142/97  HR:96bpm  TEMP: ( )  RESP:  Physical Exam Constitutional:      General: She is not in acute distress.    Appearance: Normal appearance. She is not ill-appearing.  HENT:     Head: Normocephalic and atraumatic.     Right Ear: External ear normal.     Left Ear: External ear normal.  Eyes:     Extraocular Movements: Extraocular movements intact.  Cardiovascular:     Rate and Rhythm: Normal rate.     Pulses: Normal pulses.  Musculoskeletal:     Right lower leg: No edema.     Left lower leg: No edema.     Comments: Patient has good distal strength with no pain over the greater trochanters.  No clonus or focal weakness.  Skin:    Findings: No erythema, lesion or rash.  Neurological:     General: No focal deficit present.     Mental Status: She is alert and oriented to person, place, and time.     Sensory: No sensory deficit.     Motor: No weakness or abnormal muscle tone.     Coordination: Coordination normal.  Psychiatric:        Mood and Affect: Mood normal.        Behavior: Behavior normal.     Ortho Exam Imaging: No results found.  Past Medical/Family/Surgical/Social History: Medications & Allergies reviewed per EMR, new medications updated. Patient Active Problem List   Diagnosis Date Noted  . Influenza vaccine needed 12/23/2018  . Renal insufficiency 08/22/2018  . Obesity (BMI 30-39.9) 01/15/2017  . Alcohol use disorder, mild, abuse 01/15/2017  . Closed fracture of left distal femur ([place]) 09/03/2016  . Osteoarthritis of left knee 05/02/2015  . Postlaminectomy syndrome, lumbar region 11/09/2013  . Lumbosacral spondylosis without myelopathy 11/09/2013  . Dyslipidemia 10/11/2013  . Ventral hernia 05/19/2013  . DEPRESSION  06/13/2010  . DIVERTICULOSIS, COLON 07/21/2009  . SYPHILIS 05/14/2009  . DYSHIDROTIC ECZEMA, HANDS 11/06/2008  . Chronic low back pain 11/06/2008  . FIBROIDS, UTERUS 11/17/2007  . Essential hypertension 11/17/2007   Past Medical History:  Diagnosis Date  . Cataract 12/2017   Dr. Schuyler Amor  . Dizziness    occasionally  . Headache   . Hyperlipidemia    takes Tricor daily  . Hypertension    takes Lisinopril and Metoprolol daily  . Hypertensive retinopathy 12/2017   Dr. Schuyler Amor  . Numbness    stinging in right hand  . Peripheral edema    takes lasix daily  . Ventral hernia    Family History  Problem Relation Age of Onset  . Stroke Mother   . Breast cancer Sister    Past Surgical History:  Procedure Laterality Date  . ABDOMINAL HYSTERECTOMY    . BACK SURGERY  09/01/2010   L4-5 lumbar decompression including laminotomy, facetectomy,   . colonscopy    .  FEMUR IM NAIL Left 09/05/2016  . FEMUR IM NAIL Left 09/05/2016   Procedure: INTRAMEDULLARY (IM) NAIL FEMORAL;  Surgeon: Nicholes Stairs, MD;  Location: [facility];  Service: Orthopedics;  Laterality: Left;  . FRACTURE SURGERY     left leg  . INSERTION OF MESH N/A 05/24/2013   Procedure: INSERTION OF MESH;  Surgeon: Imogene Burn. Georgette Dover, MD;  Location: [facility];  Service: General;  Laterality: N/A;  . UTERINE FIBROID SURGERY    . VENTRAL HERNIA REPAIR N/A 05/24/2013   Procedure: OPEN REPAIR EPIGASTRIC VENTRAL HERNIA;  Surgeon: Imogene Burn. Georgette Dover, MD;  Location: [facility];  Service: General;  Laterality: N/A;   Social History   Occupational History  . Not on file  Tobacco Use  . Smoking status: Former Research scientist (life sciences)  . Smokeless tobacco: Never Used  Substance and Sexual Activity  . Alcohol use: Yes    Alcohol/week: 2.0 standard drinks    Types: 2 Cans of beer per week  . Drug use: No  . Sexual activity: Yes    Birth control/protection: Surgical

## 2019-02-09 NOTE — Procedures (Signed)
Lumbosacral Transforaminal Epidural Steroid Injection - Sub-Pedicular Approach with Fluoroscopic Guidance  Patient: Shannon [name]      Date of Birth: 11-29-1955 MRN: [id number] PCP: Ladell Pier, MD      Visit Date: 02/09/2019   Universal Protocol:    Date/Time: 02/09/2019  Consent Given By: the patient  Position: PRONE  Additional Comments: Vital signs were monitored before and after the procedure. Patient was prepped and draped in the usual sterile fashion. The correct patient, procedure, and site was verified.   Injection Procedure Details:  Procedure Site One Meds Administered:  Meds ordered this encounter  Medications  . betamethasone acetate-betamethasone sodium phosphate (CELESTONE) injection 12 mg    Laterality: Right  Location/Site:  L5-S1  Needle size: 22 G  Needle type: Spinal  Needle Placement: Transforaminal  Findings:    -Comments: Excellent flow of contrast along the nerve and into the epidural space.  Procedure Details: After squaring off the end-plates to get a true AP view, the C-arm was positioned so that an oblique view of the foramen as noted above was visualized. The target area is just inferior to the "nose of the scotty dog" or sub pedicular. The soft tissues overlying this structure were infiltrated with 2-3 ml. of 1% Lidocaine without Epinephrine.  The spinal needle was inserted toward the target using a "trajectory" view along the fluoroscope beam.  Under AP and lateral visualization, the needle was advanced so it did not puncture dura and was located close the 6 O'Clock position of the pedical in AP tracterory. Biplanar projections were used to confirm position. Aspiration was confirmed to be negative for CSF and/or blood. A 1-2 ml. volume of Isovue-250 was injected and flow of contrast was noted at each level. Radiographs were obtained for documentation purposes.   After attaining the desired flow of contrast documented above,  a 0.5 to 1.0 ml test dose of 0.25% Marcaine was injected into each respective transforaminal space.  The patient was observed for 90 seconds post injection.  After no sensory deficits were reported, and normal lower extremity motor function was noted,   the above injectate was administered so that equal amounts of the injectate were placed at each foramen (level) into the transforaminal epidural space.   Additional Comments:  The patient tolerated the procedure well Dressing: 2 x 2 sterile gauze and Band-Aid    Post-procedure details: Patient was observed during the procedure. Post-procedure instructions were reviewed.  Patient left the clinic in stable condition.

## 2019-02-15 ENCOUNTER — Ambulatory Visit: Payer: Medicare Other | Admitting: Pharmacist

## 2019-02-15 ENCOUNTER — Other Ambulatory Visit: Payer: Self-pay | Admitting: Internal Medicine

## 2019-02-15 DIAGNOSIS — I10 Essential (primary) hypertension: Secondary | ICD-10-CM

## 2019-02-22 ENCOUNTER — Telehealth: Payer: Self-pay | Admitting: Physical Medicine and Rehabilitation

## 2019-02-22 DIAGNOSIS — M5416 Radiculopathy, lumbar region: Secondary | ICD-10-CM

## 2019-02-22 NOTE — Telephone Encounter (Signed)
Can run by Dr. Junius Roads but he or we need to get MRI Lspine, likely with contrast if possible with prior fusion

## 2019-02-22 NOTE — Telephone Encounter (Signed)
Would you mind calling her please?

## 2019-02-22 NOTE — Telephone Encounter (Signed)
Please advise 

## 2019-02-22 NOTE — Telephone Encounter (Signed)
Called patient and got busy signal x4.

## 2019-02-22 NOTE — Telephone Encounter (Signed)
MRI ordered

## 2019-02-23 NOTE — Telephone Encounter (Signed)
I called and advised the patient of the plan. The imaging facility will be calling her to schedule the MRI.

## 2019-02-24 ENCOUNTER — Encounter: Payer: Self-pay | Admitting: Pharmacist

## 2019-02-24 ENCOUNTER — Ambulatory Visit: Payer: Medicare Other | Attending: Internal Medicine | Admitting: Pharmacist

## 2019-02-24 ENCOUNTER — Other Ambulatory Visit: Payer: Self-pay

## 2019-02-24 VITALS — BP 130/81 | HR 75

## 2019-02-24 DIAGNOSIS — Z8249 Family history of ischemic heart disease and other diseases of the circulatory system: Secondary | ICD-10-CM | POA: Insufficient documentation

## 2019-02-24 DIAGNOSIS — I10 Essential (primary) hypertension: Secondary | ICD-10-CM | POA: Diagnosis not present

## 2019-02-24 DIAGNOSIS — F17210 Nicotine dependence, cigarettes, uncomplicated: Secondary | ICD-10-CM | POA: Insufficient documentation

## 2019-02-24 DIAGNOSIS — Z7901 Long term (current) use of anticoagulants: Secondary | ICD-10-CM | POA: Diagnosis not present

## 2019-02-24 NOTE — Patient Instructions (Signed)

## 2019-02-24 NOTE — Progress Notes (Signed)
   S:    PCP: Dr. Wynetta Emery  Patient arrives in good spirits. Presents to the clinic for BP check.  Patient was referred and last seen by Primary Care Provider on 12/23/18. I saw her on 01/12/19 and instructed her to restart her amlodipine.   Patient reports adherence with medications.   Patient denies chest pain, dyspnea, blurred vision.   Current BP Medications include:  Amlodipine 5 mg daily; HCTZ 12.5 mg daily, lisinopril 40 mg daily, Toprol XL 100 mg daily  Dietary habits include: compliant with salt restriction; denies drinking caffeine  Exercise habits include: denies  Family / Social history:  - FHx: stroke (mother) - Tobacco: never smoker   - Alcohol: drinks 1 beer a week   O:  Vitals:   02/24/19 1004  BP: 130/81  Pulse: 75   Home BP readings: does not have device to check BP at home   Last 3 Office BP readings: BP Readings from Last 3 Encounters:  02/24/19 130/81  02/09/19 (!) 142/97  01/12/19 140/82   BMET    Component Value Date/Time   NA 146 (H) 01/05/2019 1022   K 4.4 01/05/2019 1022   CL 109 (H) 01/05/2019 1022   CO2 24 01/05/2019 1022   GLUCOSE 101 (H) 01/05/2019 1022   GLUCOSE 109 (H) 09/03/2016 1440   BUN 14 01/05/2019 1022   CREATININE 1.06 (H) 01/05/2019 1022   CREATININE 0.90 05/02/2015 1608   CALCIUM 10.1 01/05/2019 1022   GFRNONAA 57 (L) 01/05/2019 1022   GFRNONAA 54 (L) 09/27/2014 1152   GFRAA 66 01/05/2019 1022   GFRAA 63 09/27/2014 1152   Renal function: CrCl cannot be calculated (Patient's most recent lab result is older than the maximum 21 days allowed.).  Clinical ASCVD: No  The 10-year ASCVD risk score Mikey Bussing DC Jr., et al., 2013) is: 11.2%   Values used to calculate the score:     Age: 32 years     Sex: Female     Is Non-Hispanic African American: Yes     Diabetic: No     Tobacco smoker: Yes     Systolic Blood Pressure: [id number] mmHg     Is BP treated: Yes     HDL Cholesterol: 58 mg/dL     Total Cholesterol: 153 mg/dL   A/P:  Hypertension longstanding currently controlled on current medications. BP Goal = <130/80 mmHg. Patient is adherent with medications. -Continued current medications.  -Counseled on lifestyle modifications for blood pressure control including reduced dietary sodium, increased exercise, adequate sleep  Results reviewed and written information provided.   Total time in face-to-face counseling 15 minutes.   F/U Clinic Visit in 1 week.    Benard Halsted, PharmD, [place] [phone]

## 2019-03-15 ENCOUNTER — Other Ambulatory Visit: Payer: Self-pay | Admitting: Internal Medicine

## 2019-03-15 DIAGNOSIS — M170 Bilateral primary osteoarthritis of knee: Secondary | ICD-10-CM

## 2019-03-15 DIAGNOSIS — I10 Essential (primary) hypertension: Secondary | ICD-10-CM

## 2019-03-16 ENCOUNTER — Other Ambulatory Visit: Payer: Self-pay | Admitting: Internal Medicine

## 2019-03-16 DIAGNOSIS — E785 Hyperlipidemia, unspecified: Secondary | ICD-10-CM

## 2019-03-16 NOTE — Telephone Encounter (Signed)
Please fill if appropriate.  

## 2019-03-24 ENCOUNTER — Other Ambulatory Visit: Payer: Self-pay

## 2019-03-24 ENCOUNTER — Ambulatory Visit: Payer: Medicare Other | Attending: Internal Medicine | Admitting: Internal Medicine

## 2019-03-24 ENCOUNTER — Encounter: Payer: Self-pay | Admitting: Internal Medicine

## 2019-03-24 DIAGNOSIS — G8929 Other chronic pain: Secondary | ICD-10-CM | POA: Diagnosis not present

## 2019-03-24 DIAGNOSIS — M170 Bilateral primary osteoarthritis of knee: Secondary | ICD-10-CM

## 2019-03-24 DIAGNOSIS — R7303 Prediabetes: Secondary | ICD-10-CM | POA: Diagnosis not present

## 2019-03-24 DIAGNOSIS — E559 Vitamin D deficiency, unspecified: Secondary | ICD-10-CM | POA: Insufficient documentation

## 2019-03-24 DIAGNOSIS — M545 Low back pain, unspecified: Secondary | ICD-10-CM

## 2019-03-24 DIAGNOSIS — I10 Essential (primary) hypertension: Secondary | ICD-10-CM | POA: Diagnosis not present

## 2019-03-24 MED ORDER — VITAMIN D3 10 MCG (400 UNIT) PO TABS: 400.0000 [IU] | ORAL_TABLET | Freq: Every day | ORAL | 1 refills | Status: DC

## 2019-03-24 MED ORDER — TRAMADOL HCL 50 MG PO TABS: 50.0000 mg | ORAL_TABLET | Freq: Three times a day (TID) | ORAL | 0 refills | Status: DC | PRN

## 2019-03-24 NOTE — Progress Notes (Signed)
Virtual Visit via Telephone Note Due to current restrictions/limitations of in-office visits due to the COVID-19 pandemic, this scheduled clinical appointment was converted to a telehealth visit  I connected with Shannon [name] on 03/24/19 at 9:17 a.m by telephone and verified that I am speaking with the correct person using two identifiers. I am in my office.  The patient is at home.  Only the patient and myself participated in this encounter.  I discussed the limitations, risks, security and privacy concerns of performing an evaluation and management service by telephone and the availability of in person appointments. I also discussed with the patient that there may be a patient responsible charge related to this service. The patient expressed understanding and agreed to proceed.   History of Present Illness: Patient with history of HTN, pre-DM, chronic lower back pain ( hx of L4-5 decompression surgery), HL,ETOH use disorder, OA knees.  Patient was last seen 12/2018.  Purpose of today's visit is chronic disease management.   OA knees:  Saw Dr. Junius Roads in 12/2018 and had inj to RT knee.  The right knee is more bothersome than the left.  She does not feel that the steroid injection to the right knee helped.  "My leg hurts so bad I can barely walk on it."  Taking Mobic.  X-rays showed progressive tri-compartment degen changes on LT and mild OA changes on the RT.   Pt said no was mention made of needing knee replacement surgery.  She does not have a f/u with Dr. Junius Roads. -pt also had ESI to lumbar spine by Dr. Ernestina Patches for her chronic lower back pain.  She was also started on gabapentin.  She is not sure whether the gabapentin is helpful or not  HYPERTENSION Currently taking: see medication list Med Adherence: [x]  Yes    []  No Medication side effects: []  Yes    [x]  No Adherence with salt restriction: [x]  Yes    []  No Home Monitoring?: []  Yes    [x]  No, she has seen the clinical pharmacist a few  times since last visit with me  Monitoring Frequency: []  Yes    []  No Home BP results range: []  Yes    []  No SOB? []  Yes    [x]  No Chest Pain?: []  Yes    [x]  No Leg swelling?: []  Yes    [x]  No Headaches?: []  Yes    [x]  No Dizziness? []  Yes    [x]  No Comments:   PreDM: A1C was 5.7 on last visit with me.  She is trying to monitor her eating habits.  HyperCa+: Found to have hypocalcemia on chemistry.  Work-up including parathyroid hormone and parathyroid hormone related peptide were normal.  Vitamin D level was slightly low.  Repeat chemistry revealed calcium level had normalized to 10.1.  Outpatient Encounter Medications as of 03/24/2019  Medication Sig  . amLODipine (NORVASC) 5 MG tablet Take 1 tablet (5 mg total) by mouth daily.  . DULoxetine (CYMBALTA) 20 MG capsule Take 1 capsule (20 mg total) by mouth at bedtime.  . fenofibrate (TRICOR) 48 MG tablet TAKE 1 TABLET(48 MG) BY MOUTH DAILY  . fluticasone (FLONASE) 50 MCG/ACT nasal spray Place 2 sprays into both nostrils daily.  Marland Kitchen gabapentin (NEURONTIN) 300 MG capsule 1 PO q HS, may increase to 1 PO TID if needed/tolerated  . hydrochlorothiazide (HYDRODIURIL) 12.5 MG tablet TAKE 1 TABLET(12.5 MG) BY MOUTH DAILY  . lisinopril (ZESTRIL) 40 MG tablet TAKE 1 TABLET(40 MG) BY MOUTH DAILY  .  meloxicam (MOBIC) 15 MG tablet TAKE 1 TABLET(15 MG) BY MOUTH DAILY  . metoprolol succinate (TOPROL-XL) 100 MG 24 hr tablet TAKE 1 TABLET BY MOUTH DAILY WITH OR IMMEDIATELY FOLLOWING A MEAL   No facility-administered encounter medications on file as of 03/24/2019.    Observations/Objective: Results for orders placed or performed in visit on 0000000  Basic Metabolic Panel  Result Value Ref Range   Glucose 101 (H) 65 - 99 mg/dL   BUN 14 8 - 27 mg/dL   Creatinine, Ser 1.06 (H) 0.57 - 1.00 mg/dL   GFR calc non Af Amer 57 (L) >59 mL/min/1.73   GFR calc Af Amer 66 >59 mL/min/1.73   BUN/Creatinine Ratio 13 12 - 28   Sodium 146 (H) 134 - 144 mmol/L    Potassium 4.4 3.5 - 5.2 mmol/L   Chloride 109 (H) 96 - 106 mmol/L   CO2 24 20 - 29 mmol/L   Calcium 10.1 8.7 - 10.3 mg/dL  Hemoglobin A1c  Result Value Ref Range   Hgb A1c MFr Bld 5.7 (H) 4.8 - 5.6 %   Est. average glucose Bld gHb Est-mCnc 117 mg/dL   Lab Results  Component Value Date   CALCIUM 10.1 01/05/2019   CAION 1.21 07/05/2008     Assessment and Plan: 1. Essential hypertension Blood pressure readings on last visit with the clinical pharmacist was okay.  She will continue her current medications and low-salt diet  2. Prediabetes Discussed diagnosis with patient.  Informed her that our goal is to prevent this from progressing to full diabetes.  Dietary counseling given.  Advised to avoid sugary drinks like sodas, juices and sweet tea.  Advised to cut back on portion sizes of white carbohydrates.  Encouraged to eat more white meat than red meat and to incorporate fresh fruits and vegetables into the diet  3. Osteoarthritis of both knees, unspecified osteoarthritis type Knee pain has limited her quality of life.  She is currently on meloxicam.  I will add tramadol.  Patient informed that it is a controlled substance and that it can cause drowsiness and sometimes people can become dependent on the medication.  She does have itching with morphine.  Advised patient that sometimes there can be a crossover and if she develops any itching on the medicine she should stop it and let me know.  [place] controlled substance reporting system reviewed - traMADol (ULTRAM) 50 MG tablet; Take 1 tablet (50 mg total) by mouth every 8 (eight) hours as needed.  Dispense: 60 tablet; Refill: 0 - Ambulatory referral to Orthopedic Surgery  4. Chronic bilateral low back pain without sciatica Seeing Dr. Ernestina Patches  5. Hypercalcemia This is normalized  6. Vitamin D deficiency - Cholecalciferol (VITAMIN D3) 10 MCG (400 UNIT) tablet; Take 1 tablet (400 Units total) by mouth daily.  Dispense: 100 tablet;  Refill: 1   Follow Up Instructions: 4 mths   I discussed the assessment and treatment plan with the patient. The patient was provided an opportunity to ask questions and all were answered. The patient agreed with the plan and demonstrated an understanding of the instructions.   The patient was advised to call back or seek an in-person evaluation if the symptoms worsen or if the condition fails to improve as anticipated.  I provided 16 minutes of non-face-to-face time during this encounter.   Karle Plumber, MD

## 2019-03-27 ENCOUNTER — Telehealth: Payer: Self-pay | Admitting: Family Medicine

## 2019-03-27 ENCOUNTER — Other Ambulatory Visit: Payer: Self-pay

## 2019-03-27 ENCOUNTER — Ambulatory Visit
Admission: RE | Admit: 2019-03-27 | Discharge: 2019-03-27 | Disposition: A | Discharge status: Home / Self Care | Payer: Medicare Other | Source: Ambulatory Visit | Attending: Family Medicine | Admitting: Family Medicine

## 2019-03-27 DIAGNOSIS — M5416 Radiculopathy, lumbar region: Secondary | ICD-10-CM

## 2019-03-27 DIAGNOSIS — M48061 Spinal stenosis, lumbar region without neurogenic claudication: Secondary | ICD-10-CM

## 2019-03-27 MED ORDER — GADOBENATE DIMEGLUMINE 529 MG/ML IV SOLN
16.0000 mL | Freq: Once | INTRAVENOUS | Status: AC | PRN
  Administered 2019-03-27: 11:00:00 16 mL via INTRAVENOUS

## 2019-03-27 NOTE — Telephone Encounter (Signed)
MRI shows severe spinal stenosis (narrowing of spinal canal) at L3-4 level.    Could try a course of PT, or if pain is still severe, could refer her back to Dr. Rita Ohara for surgical consult.

## 2019-03-27 NOTE — Telephone Encounter (Signed)
I called and advised the patient of the MRI results. She is still having a lot of pain, especially with walking. She would like to be referred back to Dr. Sherwood Gambler. Shannon [name] - she has an appointment here on 12/21 for her knees).

## 2019-03-27 NOTE — Telephone Encounter (Signed)
Referral placed.

## 2019-03-27 NOTE — Addendum Note (Signed)
Addended by: Hortencia Pilar on: 03/27/2019 05:02 PM   Modules accepted: Orders

## 2019-04-03 ENCOUNTER — Ambulatory Visit (INDEPENDENT_AMBULATORY_CARE_PROVIDER_SITE_OTHER): Payer: Medicare Other | Admitting: Family Medicine

## 2019-04-03 ENCOUNTER — Other Ambulatory Visit: Payer: Self-pay

## 2019-04-03 ENCOUNTER — Encounter: Payer: Self-pay | Admitting: Family Medicine

## 2019-04-03 DIAGNOSIS — M1711 Unilateral primary osteoarthritis, right knee: Secondary | ICD-10-CM | POA: Diagnosis not present

## 2019-04-03 DIAGNOSIS — M1712 Unilateral primary osteoarthritis, left knee: Secondary | ICD-10-CM | POA: Diagnosis not present

## 2019-04-03 NOTE — Progress Notes (Signed)
Office Visit Note   Patient: Shannon [name]           Date of Birth: 01-Oct-1955           MRN: [id number] Visit Date: 04/03/2019 Requested by: Shannon Pier, MD [place],  Shannon [name] PCP: Shannon Pier, MD  Subjective: Chief Complaint  Patient presents with  . Right Knee - Pain    Right >left knee pain. Hurts with walking. Wakes her at night. Aching and burning in the right leg and inner thigh. Seeing Shannon [name] tomorrow for her back.  . Left Knee - Pain    HPI: She is here with persistent right knee pain.  Injection in September did not help.  She had an injection a year ago in the left knee and the left knee is still doing well.  She is not sure why her right knee did not respond.  She does have severe spinal stenosis at the L3-4 level per recent MRI scan and she is scheduled to see Shannon [name] tomorrow.  She describes anterior knee pain with a burning pain into the anterior lateral right shin especially at night.              ROS:   All other systems were reviewed and are negative.  Objective: Vital Signs: There were no vitals taken for this visit.  Physical Exam:  General:  Alert and oriented, in no acute distress. Pulm:  Breathing unlabored. Psy:  Normal mood, congruent affect.  Right knee: She has mild pain with passive internal right hip rotation but it does not completely reproduce her knee pain.  The knee has trace effusion with no warmth.  She is tender to palpation of the patellofemoral joint and the lateral joint line.  Imaging: None today  Assessment & Plan: 1.  Chronic right knee pain possibly due to lumbar spinal stenosis.  She does have knee osteoarthritis.  Cannot rule out referred pain from hip arthritis. -Discussed options with her and elected to inject 1 more time with cortisone in the right knee.  She will follow-up with Shannon [name] tomorrow.  If this shot does not help and Shannon [name] does not feel that her leg pain  is coming from her spine, we could potentially do a diagnostic injection into her hip.     Procedures: Right knee steroid injection: After sterile prep with Betadine, injected 3 cc 1% lidocaine without epinephrine and 40 mg methylprednisolone from superolateral approach.    PMFS History: Patient Active Problem List   Diagnosis Date Noted  . Prediabetes 03/24/2019  . Vitamin D deficiency 03/24/2019  . Influenza vaccine needed 12/23/2018  . Renal insufficiency 08/22/2018  . Obesity (BMI 30-39.9) 01/15/2017  . Alcohol use disorder, mild, abuse 01/15/2017  . Closed fracture of left distal femur ([place]) 09/03/2016  . Osteoarthritis of left knee 05/02/2015  . Postlaminectomy syndrome, lumbar region 11/09/2013  . Lumbosacral spondylosis without myelopathy 11/09/2013  . Dyslipidemia 10/11/2013  . Ventral hernia 05/19/2013  . DEPRESSION 06/13/2010  . DIVERTICULOSIS, COLON 07/21/2009  . SYPHILIS 05/14/2009  . DYSHIDROTIC ECZEMA, HANDS 11/06/2008  . Chronic low back pain 11/06/2008  . FIBROIDS, UTERUS 11/17/2007  . Essential hypertension 11/17/2007   Past Medical History:  Diagnosis Date  . Cataract 12/2017   Dr. Schuyler [name]  . Dizziness    occasionally  . Headache   . Hyperlipidemia    takes Tricor daily  . Hypertension    takes Lisinopril and Metoprolol  daily  . Hypertensive retinopathy 12/2017   Dr. Schuyler [name]  . Numbness    stinging in right hand  . Peripheral edema    takes lasix daily  . Ventral hernia     Family History  Problem Relation Age of Onset  . Stroke Mother   . Breast cancer Sister     Past Surgical History:  Procedure Laterality Date  . ABDOMINAL HYSTERECTOMY    . BACK SURGERY  09/01/2010   L4-5 lumbar decompression including laminotomy, facetectomy,   . colonscopy    . FEMUR IM NAIL Left 09/05/2016  . FEMUR IM NAIL Left 09/05/2016   Procedure: INTRAMEDULLARY (IM) NAIL FEMORAL;  Surgeon: Shannon Stairs, MD;  Location: [facility];  Service: Orthopedics;   Laterality: Left;  . FRACTURE SURGERY     left leg  . INSERTION OF MESH N/A 05/24/2013   Procedure: INSERTION OF MESH;  Surgeon: Shannon Burn. Georgette Dover, MD;  Location: [facility];  Service: General;  Laterality: N/A;  . UTERINE FIBROID SURGERY    . VENTRAL HERNIA REPAIR N/A 05/24/2013   Procedure: OPEN REPAIR EPIGASTRIC VENTRAL HERNIA;  Surgeon: Shannon Burn. Georgette Dover, MD;  Location: [facility];  Service: General;  Laterality: N/A;   Social History   Occupational History  . Not on file  Tobacco Use  . Smoking status: Former Research scientist (life sciences)  . Smokeless tobacco: Never Used  Substance and Sexual Activity  . Alcohol use: Yes    Alcohol/week: 2.0 standard drinks    Types: 2 Cans of beer per week  . Drug use: No  . Sexual activity: Yes    Birth control/protection: Surgical

## 2019-04-04 DIAGNOSIS — M5136 Other intervertebral disc degeneration, lumbar region: Secondary | ICD-10-CM | POA: Insufficient documentation

## 2019-04-04 DIAGNOSIS — Z981 Arthrodesis status: Secondary | ICD-10-CM | POA: Insufficient documentation

## 2019-04-05 ENCOUNTER — Other Ambulatory Visit: Payer: Self-pay | Admitting: Neurosurgery

## 2019-04-05 DIAGNOSIS — M5136 Other intervertebral disc degeneration, lumbar region: Secondary | ICD-10-CM

## 2019-04-12 ENCOUNTER — Other Ambulatory Visit: Payer: Self-pay | Admitting: Internal Medicine

## 2019-04-12 DIAGNOSIS — M170 Bilateral primary osteoarthritis of knee: Secondary | ICD-10-CM

## 2019-05-02 ENCOUNTER — Other Ambulatory Visit: Payer: Self-pay | Admitting: Internal Medicine

## 2019-05-02 DIAGNOSIS — Z1231 Encounter for screening mammogram for malignant neoplasm of breast: Secondary | ICD-10-CM

## 2019-05-08 ENCOUNTER — Other Ambulatory Visit: Payer: Self-pay | Admitting: Internal Medicine

## 2019-05-08 DIAGNOSIS — M170 Bilateral primary osteoarthritis of knee: Secondary | ICD-10-CM

## 2019-06-08 ENCOUNTER — Other Ambulatory Visit: Payer: Self-pay | Admitting: Family Medicine

## 2019-06-08 ENCOUNTER — Ambulatory Visit
Admission: RE | Admit: 2019-06-08 | Discharge: 2019-06-08 | Disposition: A | Discharge status: Home / Self Care | Payer: Medicare Other | Source: Ambulatory Visit | Attending: Internal Medicine | Admitting: Internal Medicine

## 2019-06-08 ENCOUNTER — Other Ambulatory Visit: Payer: Self-pay

## 2019-06-08 ENCOUNTER — Other Ambulatory Visit: Payer: Self-pay | Admitting: Internal Medicine

## 2019-06-08 DIAGNOSIS — M170 Bilateral primary osteoarthritis of knee: Secondary | ICD-10-CM

## 2019-06-08 DIAGNOSIS — Z1231 Encounter for screening mammogram for malignant neoplasm of breast: Secondary | ICD-10-CM

## 2019-06-08 MED ORDER — GABAPENTIN 300 MG PO CAPS: ORAL_CAPSULE | ORAL | 3 refills | Status: DC

## 2019-06-08 NOTE — Telephone Encounter (Signed)
1) Medication(s) Requested (by name): -traMADol (ULTRAM) 50 MG tablet  -lisinopril (ZESTRIL) 40 MG tablet  -amLODipine (NORVASC) 5 MG tablet   2) Pharmacy of Choice: [facility] [place], [place] - [address] AT [place]

## 2019-06-09 ENCOUNTER — Telehealth: Payer: Self-pay | Admitting: Internal Medicine

## 2019-06-09 DIAGNOSIS — M170 Bilateral primary osteoarthritis of knee: Secondary | ICD-10-CM

## 2019-06-09 NOTE — Telephone Encounter (Signed)
Patient called from Pharmacy would like a call back when traMADol (ULTRAM) 50 MG tablet [id number]    Is sent to pharmacy for confirmation.

## 2019-06-10 MED ORDER — TRAMADOL HCL 50 MG PO TABS: 50.0000 mg | ORAL_TABLET | Freq: Three times a day (TID) | ORAL | 0 refills | Status: DC | PRN

## 2019-06-11 ENCOUNTER — Other Ambulatory Visit: Payer: Self-pay | Admitting: Internal Medicine

## 2019-06-11 DIAGNOSIS — I10 Essential (primary) hypertension: Secondary | ICD-10-CM

## 2019-06-12 ENCOUNTER — Other Ambulatory Visit: Payer: Self-pay | Admitting: Internal Medicine

## 2019-06-12 DIAGNOSIS — R928 Other abnormal and inconclusive findings on diagnostic imaging of breast: Secondary | ICD-10-CM

## 2019-06-26 ENCOUNTER — Other Ambulatory Visit: Payer: Self-pay

## 2019-06-26 ENCOUNTER — Ambulatory Visit
Admission: RE | Admit: 2019-06-26 | Discharge: 2019-06-26 | Disposition: A | Discharge status: Home / Self Care | Payer: Medicare Other | Source: Ambulatory Visit | Attending: Internal Medicine | Admitting: Internal Medicine

## 2019-06-26 ENCOUNTER — Ambulatory Visit: Admission: RE | Admit: 2019-06-26 | Payer: Medicare Other | Source: Ambulatory Visit

## 2019-06-26 DIAGNOSIS — R928 Other abnormal and inconclusive findings on diagnostic imaging of breast: Secondary | ICD-10-CM | POA: Diagnosis not present

## 2019-06-27 ENCOUNTER — Telehealth: Payer: Self-pay

## 2019-06-27 NOTE — Telephone Encounter (Signed)
Contacted pt to go over MM results pt is aware and doesn't have any questions or concerns

## 2019-07-04 DIAGNOSIS — M5416 Radiculopathy, lumbar region: Secondary | ICD-10-CM | POA: Diagnosis not present

## 2019-07-04 DIAGNOSIS — M48062 Spinal stenosis, lumbar region with neurogenic claudication: Secondary | ICD-10-CM | POA: Diagnosis not present

## 2019-07-04 DIAGNOSIS — Z6831 Body mass index (BMI) 31.0-31.9, adult: Secondary | ICD-10-CM | POA: Diagnosis not present

## 2019-07-04 DIAGNOSIS — Z981 Arthrodesis status: Secondary | ICD-10-CM | POA: Diagnosis not present

## 2019-07-04 DIAGNOSIS — M5136 Other intervertebral disc degeneration, lumbar region: Secondary | ICD-10-CM | POA: Diagnosis not present

## 2019-07-04 DIAGNOSIS — M47816 Spondylosis without myelopathy or radiculopathy, lumbar region: Secondary | ICD-10-CM | POA: Diagnosis not present

## 2019-07-04 DIAGNOSIS — M4316 Spondylolisthesis, lumbar region: Secondary | ICD-10-CM | POA: Diagnosis not present

## 2019-07-31 ENCOUNTER — Ambulatory Visit: Payer: Medicare Other | Attending: Internal Medicine | Admitting: Internal Medicine

## 2019-08-01 DIAGNOSIS — Z981 Arthrodesis status: Secondary | ICD-10-CM | POA: Diagnosis not present

## 2019-08-01 DIAGNOSIS — M5136 Other intervertebral disc degeneration, lumbar region: Secondary | ICD-10-CM | POA: Diagnosis not present

## 2019-08-01 DIAGNOSIS — M4316 Spondylolisthesis, lumbar region: Secondary | ICD-10-CM | POA: Diagnosis not present

## 2019-09-01 ENCOUNTER — Ambulatory Visit: Payer: Medicare Other | Attending: Internal Medicine | Admitting: Internal Medicine

## 2019-09-01 ENCOUNTER — Encounter: Payer: Self-pay | Admitting: Internal Medicine

## 2019-09-01 ENCOUNTER — Other Ambulatory Visit: Payer: Self-pay

## 2019-09-01 VITALS — BP 129/86 | HR 51 | Temp 96.8°F | Resp 16 | Wt 174.6 lb

## 2019-09-01 DIAGNOSIS — E785 Hyperlipidemia, unspecified: Secondary | ICD-10-CM | POA: Diagnosis not present

## 2019-09-01 DIAGNOSIS — Z79899 Other long term (current) drug therapy: Secondary | ICD-10-CM | POA: Diagnosis not present

## 2019-09-01 DIAGNOSIS — Z6829 Body mass index (BMI) 29.0-29.9, adult: Secondary | ICD-10-CM | POA: Diagnosis not present

## 2019-09-01 DIAGNOSIS — Z791 Long term (current) use of non-steroidal anti-inflammatories (NSAID): Secondary | ICD-10-CM | POA: Insufficient documentation

## 2019-09-01 DIAGNOSIS — M545 Low back pain, unspecified: Secondary | ICD-10-CM

## 2019-09-01 DIAGNOSIS — Z87891 Personal history of nicotine dependence: Secondary | ICD-10-CM | POA: Insufficient documentation

## 2019-09-01 DIAGNOSIS — I10 Essential (primary) hypertension: Secondary | ICD-10-CM | POA: Diagnosis not present

## 2019-09-01 DIAGNOSIS — D751 Secondary polycythemia: Secondary | ICD-10-CM

## 2019-09-01 DIAGNOSIS — G8929 Other chronic pain: Secondary | ICD-10-CM | POA: Diagnosis not present

## 2019-09-01 DIAGNOSIS — R7303 Prediabetes: Secondary | ICD-10-CM

## 2019-09-01 DIAGNOSIS — Z79891 Long term (current) use of opiate analgesic: Secondary | ICD-10-CM | POA: Diagnosis not present

## 2019-09-01 DIAGNOSIS — F1010 Alcohol abuse, uncomplicated: Secondary | ICD-10-CM | POA: Insufficient documentation

## 2019-09-01 DIAGNOSIS — Z1211 Encounter for screening for malignant neoplasm of colon: Secondary | ICD-10-CM

## 2019-09-01 DIAGNOSIS — F1190 Opioid use, unspecified, uncomplicated: Secondary | ICD-10-CM

## 2019-09-01 DIAGNOSIS — E559 Vitamin D deficiency, unspecified: Secondary | ICD-10-CM | POA: Insufficient documentation

## 2019-09-01 DIAGNOSIS — M170 Bilateral primary osteoarthritis of knee: Secondary | ICD-10-CM | POA: Insufficient documentation

## 2019-09-01 DIAGNOSIS — E669 Obesity, unspecified: Secondary | ICD-10-CM | POA: Insufficient documentation

## 2019-09-01 DIAGNOSIS — F329 Major depressive disorder, single episode, unspecified: Secondary | ICD-10-CM | POA: Diagnosis not present

## 2019-09-01 DIAGNOSIS — Z7189 Other specified counseling: Secondary | ICD-10-CM

## 2019-09-01 MED ORDER — MELOXICAM 15 MG PO TABS: 15.0000 mg | ORAL_TABLET | Freq: Every day | ORAL | 3 refills | Status: DC

## 2019-09-01 MED ORDER — METOPROLOL SUCCINATE ER 100 MG PO TB24: ORAL_TABLET | ORAL | 1 refills | Status: DC

## 2019-09-01 MED ORDER — LISINOPRIL 40 MG PO TABS: ORAL_TABLET | ORAL | 1 refills | Status: DC

## 2019-09-01 MED ORDER — TRAMADOL HCL 50 MG PO TABS: 50.0000 mg | ORAL_TABLET | Freq: Three times a day (TID) | ORAL | 0 refills | Status: DC | PRN

## 2019-09-01 MED ORDER — AMLODIPINE BESYLATE 5 MG PO TABS: 5.0000 mg | ORAL_TABLET | Freq: Every day | ORAL | 5 refills | Status: DC

## 2019-09-01 MED ORDER — HYDROCHLOROTHIAZIDE 12.5 MG PO TABS: ORAL_TABLET | ORAL | 1 refills | Status: DC

## 2019-09-01 NOTE — Progress Notes (Signed)
Patient ID: Shannon [name], female    DOB: October 15, 1955  MRN: [id number]  CC: Hypertension   Subjective: Shannon [name] is a 64 y.o. female who presents for chronic ds management Her concerns today include:  Patient with history of HTN, pre-DM, chronic lower back pain ( hx of L4-5 decompression surgery), HL,ETOH use disorder, OA knees, vit D def.  OA:  Had another inj to RT knee when she saw Dr. Junius Roads 04/02/2020.  She did not find it helpful -Saw the neurosurgeon who did her back surgery 07/2019. He did not recommend any further surgical interventions. Has appt with one of his partners to do [facility]  09/05/2019 -uses motorized wheelchair at home.  Ambulates with cane when out in public.  Denies any recent falls. -still taking Mobic.  Request RF on Tramadol.  It helps. No S.E.  Out of Cymbalta  ETOH: drinks two 16 oz can beer/day  HYPERTENSION Currently taking: see medication list Med Adherence: [x]  Yes -out of Norvasc for a few wks    []  No Medication side effects: []  Yes    [x]  No Adherence with salt restriction: [x]  Yes    []  No Home Monitoring?: []  Yes    [x]  No Monitoring Frequency: []  Yes    []  No Home BP results range: []  Yes    []  No SOB? []  Yes    [x]  No Chest Pain?: []  Yes    [x]  No Leg swelling?: []  Yes    [x]  No Headaches?: []  Yes    [x]  No Dizziness? []  Yes    [x]  No Comments:   HM: She has not had the COVID-19 vaccine and is skeptical about getting it.  She is due for colon cancer screening.  Agreeable to referral for colonoscopy  Patient Active Problem List   Diagnosis Date Noted  . Prediabetes 03/24/2019  . Vitamin D deficiency 03/24/2019  . Influenza vaccine needed 12/23/2018  . Renal insufficiency 08/22/2018  . Obesity (BMI 30-39.9) 01/15/2017  . Alcohol use disorder, mild, abuse 01/15/2017  . Closed fracture of left distal femur ([place]) 09/03/2016  . Osteoarthritis of left knee 05/02/2015  . Postlaminectomy syndrome, lumbar region 11/09/2013  .  Lumbosacral spondylosis without myelopathy 11/09/2013  . Dyslipidemia 10/11/2013  . Ventral hernia 05/19/2013  . DEPRESSION 06/13/2010  . DIVERTICULOSIS, COLON 07/21/2009  . SYPHILIS 05/14/2009  . DYSHIDROTIC ECZEMA, HANDS 11/06/2008  . Chronic low back pain 11/06/2008  . FIBROIDS, UTERUS 11/17/2007  . Essential hypertension 11/17/2007     Current Outpatient Medications on File Prior to Visit  Medication Sig Dispense Refill  . Cholecalciferol (VITAMIN D3) 10 MCG (400 UNIT) tablet Take 1 tablet (400 Units total) by mouth daily. 100 tablet 1  . DULoxetine (CYMBALTA) 20 MG capsule Take 1 capsule (20 mg total) by mouth at bedtime. 30 capsule 6  . fenofibrate (TRICOR) 48 MG tablet TAKE 1 TABLET(48 MG) BY MOUTH DAILY 90 tablet 1  . fluticasone (FLONASE) 50 MCG/ACT nasal spray Place 2 sprays into both nostrils daily. 16 g 0  . gabapentin (NEURONTIN) 300 MG capsule 1 PO q HS, may increase to 1 PO TID if needed/tolerated 90 capsule 3   No current facility-administered medications on file prior to visit.    Allergies  Allergen Reactions  . Morphine And Related     Itch    Social History   Socioeconomic History  . Marital status: Legally Separated    Spouse name: Not on file  . Number of children: Not on  file  . Years of education: Not on file  . Highest education level: Not on file  Occupational History  . Not on file  Tobacco Use  . Smoking status: Former Research scientist (life sciences)  . Smokeless tobacco: Never Used  Substance and Sexual Activity  . Alcohol use: Yes    Alcohol/week: 2.0 standard drinks    Types: 2 Cans of beer per week  . Drug use: No  . Sexual activity: Yes    Birth control/protection: Surgical  Other Topics Concern  . Not on file  Social History Narrative   ** Merged History Encounter **       Social Determinants of Health   Financial Resource Strain:   . Difficulty of Paying Living Expenses:   Food Insecurity:   . Worried About Charity fundraiser in the Last Year:     . Arboriculturist in the Last Year:   Transportation Needs:   . Film/video editor (Medical):   Marland Kitchen Lack of Transportation (Non-Medical):   Physical Activity:   . Days of Exercise per Week:   . Minutes of Exercise per Session:   Stress:   . Feeling of Stress :   Social Connections:   . Frequency of Communication with Friends and Family:   . Frequency of Social Gatherings with Friends and Family:   . Attends Religious Services:   . Active Member of Clubs or Organizations:   . Attends Archivist Meetings:   Marland Kitchen Marital Status:   Intimate Partner Violence:   . Fear of Current or Ex-Partner:   . Emotionally Abused:   Marland Kitchen Physically Abused:   . Sexually Abused:     Family History  Problem Relation Age of Onset  . Stroke Mother   . Breast cancer Sister     Past Surgical History:  Procedure Laterality Date  . ABDOMINAL HYSTERECTOMY    . BACK SURGERY  09/01/2010   L4-5 lumbar decompression including laminotomy, facetectomy,   . colonscopy    . FEMUR IM NAIL Left 09/05/2016  . FEMUR IM NAIL Left 09/05/2016   Procedure: INTRAMEDULLARY (IM) NAIL FEMORAL;  Surgeon: Nicholes Stairs, MD;  Location: [facility];  Service: Orthopedics;  Laterality: Left;  . FRACTURE SURGERY     left leg  . INSERTION OF MESH N/A 05/24/2013   Procedure: INSERTION OF MESH;  Surgeon: Imogene Burn. Georgette Dover, MD;  Location: [facility];  Service: General;  Laterality: N/A;  . UTERINE FIBROID SURGERY    . VENTRAL HERNIA REPAIR N/A 05/24/2013   Procedure: OPEN REPAIR EPIGASTRIC VENTRAL HERNIA;  Surgeon: Imogene Burn. Tsuei, MD;  Location: [facility];  Service: General;  Laterality: N/A;    ROS: Review of Systems Negative except as stated above  PHYSICAL EXAM: BP 129/86   Pulse (!) 51   Temp (!) 96.8 F (36 C)   Resp 16   Wt 174 lb 9.6 oz (79.2 kg)   SpO2 97%   BMI 29.97 kg/m   Wt Readings from Last 3 Encounters:  09/01/19 174 lb 9.6 oz (79.2 kg)  12/23/18 178 lb (80.7 kg)  05/12/18 178 lb 9.6 oz (81 kg)     Physical Exam  General appearance - alert, well appearing, and in no distress Mental status - normal mood, behavior, speech, dress, motor activity, and thought processes Neck - supple, no significant adenopathy Chest - clear to auscultation, no wheezes, rales or rhonchi, symmetric air entry Heart - normal rate, regular rhythm, normal S1, S2, no murmurs,  rubs, clicks or gallops Extremities - peripheral pulses normal, no pedal edema, no clubbing or cyanosis MSK: She ambulates with a cane.  Knees: Mild to moderate enlargement of the knee joints.  Mild crepitus and discomfort on passive range of motion.  CMP Latest Ref Rng & Units 01/05/2019 12/23/2018 09/02/2018  Glucose 65 - 99 mg/dL 101(H) 108(H) 113(H)  BUN 8 - 27 mg/dL 14 24 21   Creatinine 0.57 - 1.00 mg/dL 1.06(H) 1.07(H) 1.13(H)  Sodium 134 - 144 mmol/L 146(H) 146(H) 145(H)  Potassium 3.5 - 5.2 mmol/L 4.4 4.8 4.5  Chloride 96 - 106 mmol/L 109(H) 107(H) 103  CO2 20 - 29 mmol/L 24 22 22   Calcium 8.7 - 10.3 mg/dL 10.1 10.5(H) 10.6(H)  Total Protein 6.0 - 8.5 g/dL - - -  Total Bilirubin 0.0 - 1.2 mg/dL - - -  Alkaline Phos 39 - 117 IU/L - - -  AST 0 - 40 IU/L - - -  ALT 0 - 32 IU/L - - -   Lipid Panel     Component Value Date/Time   CHOL 153 11/12/2017 0920   TRIG 73 11/12/2017 0920   HDL 58 11/12/2017 0920   CHOLHDL 2.6 11/12/2017 0920   CHOLHDL 2.9 05/31/2014 1457   VLDL 17 05/31/2014 1457   LDLCALC 80 11/12/2017 0920    CBC    Component Value Date/Time   WBC 6.1 11/12/2017 0920   WBC 6.5 09/03/2016 1440   RBC 5.53 (H) 11/12/2017 0920   RBC 5.32 (H) 09/03/2016 1440   HGB 13.0 11/12/2017 0920   HCT 41.9 11/12/2017 0920   PLT 322 11/12/2017 0920   MCV 76 (L) 11/12/2017 0920   MCH 23.5 (L) 11/12/2017 0920   MCH 23.3 (L) 09/03/2016 1440   MCHC 31.0 (L) 11/12/2017 0920   MCHC 31.2 09/03/2016 1440   RDW 16.5 (H) 11/12/2017 0920   LYMPHSABS 1.9 09/03/2016 1440   MONOABS 0.2 09/03/2016 1440   EOSABS 0.1 09/03/2016  1440   BASOSABS 0.0 09/03/2016 1440    ASSESSMENT AND PLAN: 1. Essential hypertension Not at goal but patient has been out of amlodipine.  Refills given on blood pressure medications. - amLODipine (NORVASC) 5 MG tablet; Take 1 tablet (5 mg total) by mouth daily.  Dispense: 30 tablet; Refill: 5 - hydrochlorothiazide (HYDRODIURIL) 12.5 MG tablet; TAKE 1 TABLET(12.5 MG) BY MOUTH DAILY  Dispense: 90 tablet; Refill: 1 - lisinopril (ZESTRIL) 40 MG tablet; TAKE 1 TABLET(40 MG) BY MOUTH DAILY  Dispense: 90 tablet; Refill: 1 - metoprolol succinate (TOPROL-XL) 100 MG 24 hr tablet; TAKE 1 TABLET BY MOUTH DAILY WITH OR IMMEDIATELY FOLLOWING A MEAL  Dispense: 90 tablet; Refill: 1 - CBC - Comprehensive metabolic panel - Lipid panel  2. Osteoarthritis of both knees, unspecified osteoarthritis type Pain affects her quality of life.  Her orthopedic specialist thinks that pain may be referred from her hips or her back. -I will refill meloxicam and tramadol. I went over controlled substance prescribing agreement and she was agreeable to the contents of it.  We will get urine drug screen. [place] - traMADol (ULTRAM) 50 MG tablet; Take 1 tablet (50 mg total) by mouth every 8 (eight) hours as needed.  Dispense: 90 tablet; Refill: 0 - [id number] 11+Oxyco+Alc+Crt-Bund - meloxicam (MOBIC) 15 MG tablet; Take 1 tablet (15 mg total) by mouth daily.  Dispense: 30 tablet; Refill: 3  3. Prediabetes Discussed and encourage healthy eating habits - Hemoglobin A1c; Future  4. Chronic bilateral low back pain without sciatica  We will have ESI later this month  5. Screening for colon cancer - Ambulatory referral to Gastroenterology  6. Educated about COVID-19 virus infection Discussed and encourage her to get the COVID-19 vaccine.  Advised that the vaccine is relatively safe and effective  7. Chronic, continuous use of opioids [id number] 11+Oxyco+Alc+Crt-Bund  Patient was given the opportunity to ask questions.   Patient verbalized understanding of the plan and was able to repeat key elements of the plan.   Orders Placed This Encounter  Procedures  . [id number] 11+Oxyco+Alc+Crt-Bund  . CBC  . Comprehensive metabolic panel  . Lipid panel  . Hemoglobin A1c  . Ambulatory referral to Gastroenterology     Requested Prescriptions   Signed Prescriptions Disp Refills  . traMADol (ULTRAM) 50 MG tablet 90 tablet 0    Sig: Take 1 tablet (50 mg total) by mouth every 8 (eight) hours as needed.  Marland Kitchen amLODipine (NORVASC) 5 MG tablet 30 tablet 5    Sig: Take 1 tablet (5 mg total) by mouth daily.  . meloxicam (MOBIC) 15 MG tablet 30 tablet 3    Sig: Take 1 tablet (15 mg total) by mouth daily.  . hydrochlorothiazide (HYDRODIURIL) 12.5 MG tablet 90 tablet 1    Sig: TAKE 1 TABLET(12.5 MG) BY MOUTH DAILY  . lisinopril (ZESTRIL) 40 MG tablet 90 tablet 1    Sig: TAKE 1 TABLET(40 MG) BY MOUTH DAILY  . metoprolol succinate (TOPROL-XL) 100 MG 24 hr tablet 90 tablet 1    Sig: TAKE 1 TABLET BY MOUTH DAILY WITH OR IMMEDIATELY FOLLOWING A MEAL    Return in about 4 months (around 01/02/2020).  Karle Plumber, MD, FACP

## 2019-09-02 ENCOUNTER — Telehealth: Payer: Self-pay

## 2019-09-02 LAB — COMPREHENSIVE METABOLIC PANEL
ALT: 19 IU/L (ref 0–32)
AST: 23 IU/L (ref 0–40)
Albumin/Globulin Ratio: 1.3 (ref 1.2–2.2)
Albumin: 4.5 g/dL (ref 3.8–4.8)
Alkaline Phosphatase: 56 IU/L (ref 48–121)
BUN/Creatinine Ratio: 12 (ref 12–28)
BUN: 13 mg/dL (ref 8–27)
Bilirubin Total: 0.4 mg/dL (ref 0.0–1.2)
CO2: 23 mmol/L (ref 20–29)
Calcium: 10.3 mg/dL (ref 8.7–10.3)
Chloride: 107 mmol/L — ABNORMAL HIGH (ref 96–106)
Creatinine, Ser: 1.12 mg/dL — ABNORMAL HIGH (ref 0.57–1.00)
GFR calc Af Amer: 61 mL/min/{1.73_m2} (ref >59)
GFR calc non Af Amer: 53 mL/min/{1.73_m2} — ABNORMAL LOW (ref >59)
Globulin, Total: 3.5 g/dL (ref 1.5–4.5)
Glucose: 90 mg/dL (ref 65–99)
Potassium: 5 mmol/L (ref 3.5–5.2)
Sodium: 145 mmol/L — ABNORMAL HIGH (ref 134–144)
Total Protein: 8 g/dL (ref 6.0–8.5)

## 2019-09-02 LAB — LIPID PANEL
Chol/HDL Ratio: 2.4 ratio (ref 0.0–4.4)
Cholesterol, Total: 195 mg/dL (ref 100–199)
HDL: 82 mg/dL (ref >39)
LDL Chol Calc (NIH): 102 mg/dL — ABNORMAL HIGH (ref 0–99)
Triglycerides: 61 mg/dL (ref 0–149)
VLDL Cholesterol Cal: 11 mg/dL (ref 5–40)

## 2019-09-02 LAB — CBC
Hematocrit: 48.3 % — ABNORMAL HIGH (ref 34.0–46.6)
Hemoglobin: 14.7 g/dL (ref 11.1–15.9)
MCH: 23.6 pg — ABNORMAL LOW (ref 26.6–33.0)
MCHC: 30.4 g/dL — ABNORMAL LOW (ref 31.5–35.7)
MCV: 77 fL — ABNORMAL LOW (ref 79–97)
Platelets: 360 10*3/uL (ref 150–450)
RBC: 6.24 x10E6/uL — ABNORMAL HIGH (ref 3.77–5.28)
RDW: 15.2 % (ref 11.7–15.4)
WBC: 5.6 10*3/uL (ref 3.4–10.8)

## 2019-09-02 NOTE — Telephone Encounter (Signed)
Contacted pt to go over lab results pt is aware and doesn't have any questions or concerns 

## 2019-09-02 NOTE — Progress Notes (Signed)
Kidney function is mildly reduced but stable compared to 8 months ago.  Sodium level mildly elevated.  I recommend drinking several glasses of water daily.  Liver enzymes are normal.  She has mild elevation in LDL cholesterol of 102 with goal being less than 100.  Healthy eating habits will help to lower cholesterol.  She has elevation of her red blood cell count.  Like to do additional blood test to evaluate this further.  I will asked the lab to add this level onto blood that was already drawn.  I will get back to her once I get the results.

## 2019-09-02 NOTE — Addendum Note (Signed)
Addended by: Karle Plumber B on: 09/02/2019 08:59 AM   Modules accepted: Orders

## 2019-09-05 DIAGNOSIS — M5136 Other intervertebral disc degeneration, lumbar region: Secondary | ICD-10-CM | POA: Diagnosis not present

## 2019-09-07 LAB — DRUG SCREEN 764883 11+OXYCO+ALC+CRT-BUND
Amphetamines, Urine: NEGATIVE ng/mL
BENZODIAZ UR QL: NEGATIVE ng/mL
Barbiturate: NEGATIVE ng/mL
Cannabinoid Quant, Ur: NEGATIVE ng/mL
Creatinine: 166.7 mg/dL (ref 20.0–300.0)
Ethanol: NEGATIVE %
Meperidine: NEGATIVE ng/mL
Methadone Screen, Urine: NEGATIVE ng/mL
OPIATE SCREEN URINE: NEGATIVE ng/mL
Oxycodone/Oxymorphone, Urine: NEGATIVE ng/mL
Phencyclidine: NEGATIVE ng/mL
Propoxyphene: NEGATIVE ng/mL
Tramadol: NEGATIVE ng/mL
pH, Urine: 4.9 (ref 4.5–8.9)

## 2019-09-07 LAB — ERYTHROPOIETIN: Erythropoietin: 7.5 m[IU]/mL (ref 2.6–18.5)

## 2019-09-07 LAB — COCAINE CONF, UR
Benzoylecgonine GC/MS Conf: >5000 ng/mL
Cocaine Metab Quant, Ur: POSITIVE — AB

## 2019-09-07 LAB — SPECIMEN STATUS REPORT

## 2019-09-13 DIAGNOSIS — Z1211 Encounter for screening for malignant neoplasm of colon: Secondary | ICD-10-CM | POA: Diagnosis not present

## 2019-09-13 DIAGNOSIS — Z01818 Encounter for other preprocedural examination: Secondary | ICD-10-CM | POA: Diagnosis not present

## 2019-11-20 DIAGNOSIS — M5416 Radiculopathy, lumbar region: Secondary | ICD-10-CM | POA: Diagnosis not present

## 2019-11-21 DIAGNOSIS — K648 Other hemorrhoids: Secondary | ICD-10-CM | POA: Diagnosis not present

## 2019-11-21 DIAGNOSIS — Z1211 Encounter for screening for malignant neoplasm of colon: Secondary | ICD-10-CM | POA: Diagnosis not present

## 2019-12-29 DIAGNOSIS — Z981 Arthrodesis status: Secondary | ICD-10-CM | POA: Diagnosis not present

## 2019-12-29 DIAGNOSIS — M5416 Radiculopathy, lumbar region: Secondary | ICD-10-CM | POA: Diagnosis not present

## 2020-01-01 ENCOUNTER — Telehealth (INDEPENDENT_AMBULATORY_CARE_PROVIDER_SITE_OTHER): Payer: Self-pay | Admitting: Internal Medicine

## 2020-01-01 NOTE — Telephone Encounter (Signed)
Copied from [place] [id number]. Topic: General - Other >> Jan 01, 2020  2:51 PM Celene Kras wrote: Reason for CRM: [place], from [employer], called regarding a fax that was sent over on 12/26/19. She is requesting to have progress notes within the last 12 months that show the pt is still using wheelchair. She has not received a response.      Please advise.

## 2020-01-01 NOTE — Telephone Encounter (Signed)
Returned call and spoke to [facility] and made aware we haven't received fax and she will fax another form over

## 2020-01-02 ENCOUNTER — Ambulatory Visit: Payer: Medicare Other | Attending: Internal Medicine | Admitting: Internal Medicine

## 2020-01-08 IMAGING — MR MR LUMBAR SPINE WO/W CM
7 series · 48 of 48 positions shown · IV contrast (multihance)
Comparison: 6496

CLINICAL DATA: Low back and right leg pain, history of surgery

EXAM:
MRI LUMBAR SPINE WITHOUT AND WITH CONTRAST
TECHNIQUE: Multiplanar and multiecho pulse sequences of the lumbar spine were
obtained without and with intravenous contrast.
CONTRAST:  16mL MULTIHANCE GADOBENATE DIMEGLUMINE 529 MG/ML IV SOLN

[Series 3: tirm sag · sagittal · 4.0mm · 0.55mm/px · 3 of 12 slices shown]
[im 1/12]
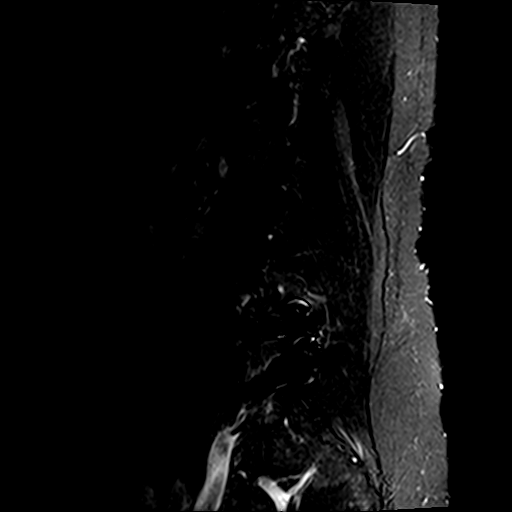
[im 6/12]
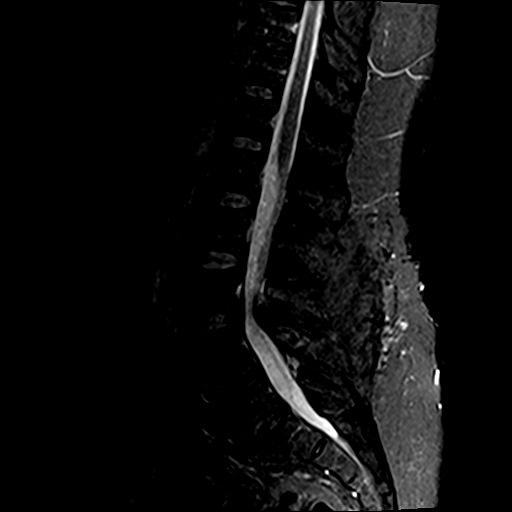
[im 12/12]
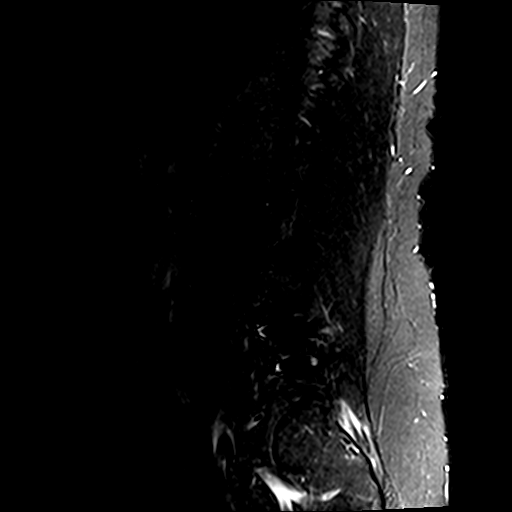

[Series 4: T1 · sagittal · 4.0mm · 0.88mm/px · 4 of 12 slices shown (1 of 2)]
[im 1/12]
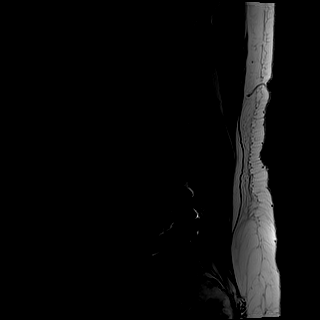
[im 4/12]
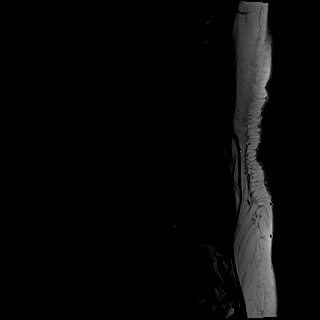
[im 8/12]
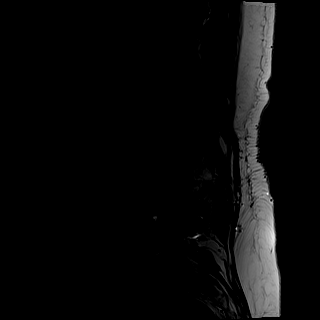
[im 12/12]
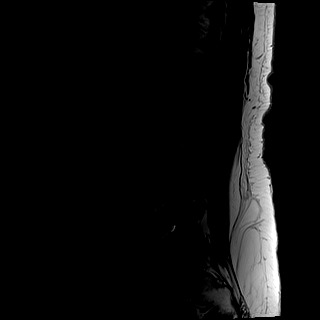

[Series 5: T1 · axial · 4.0mm · 0.78mm/px · z∈[-57,+155]mm · 11 of 34 slices shown (2 of 2)]
[im 1/34]
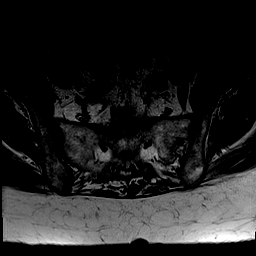
[im 4/34]
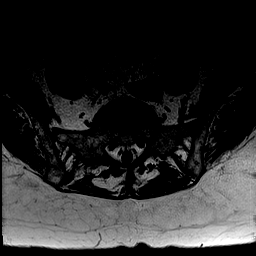
[im 7/34]
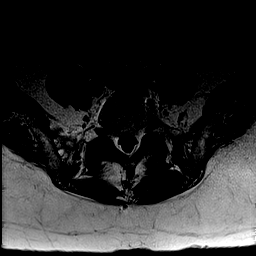
[im 10/34]
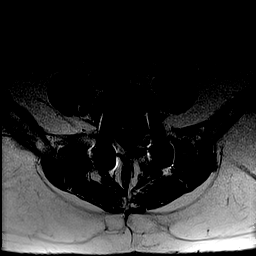
[im 14/34]
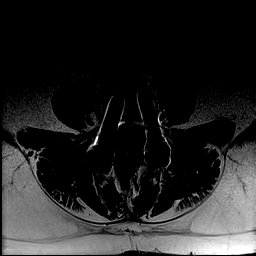
[im 17/34]
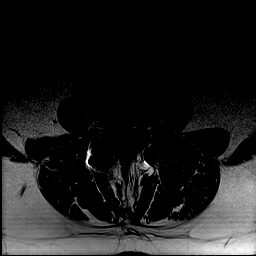
[im 20/34]
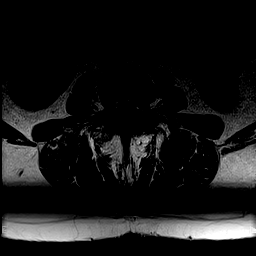
[im 24/34]
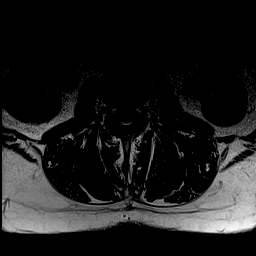
[im 27/34]
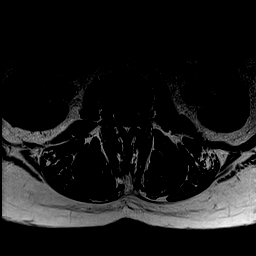
[im 30/34]
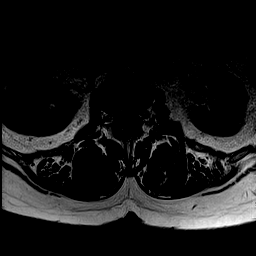
[im 34/34]
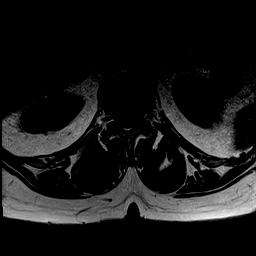

[Series 6: T2 · axial · 4.0mm · 1.04mm/px · z∈[-57,+155]mm · 11 of 34 slices shown]
[im 1/34]
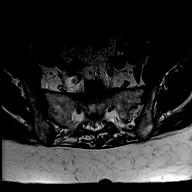
[im 4/34]
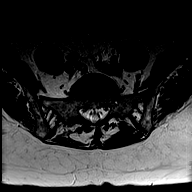
[im 7/34]
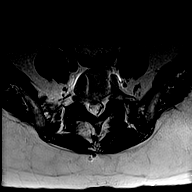
[im 10/34]
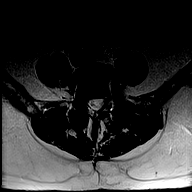
[im 14/34]
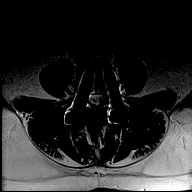
[im 17/34]
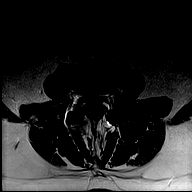
[im 20/34]
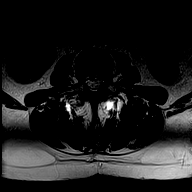
[im 24/34]
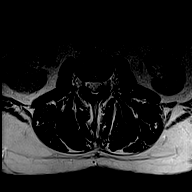
[im 27/34]
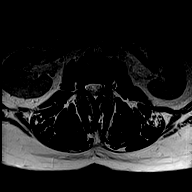
[im 30/34]
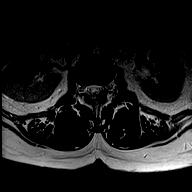
[im 34/34]
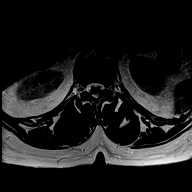

[Series 7: T2 post-contrast · sagittal · 4.0mm · 0.88mm/px · 4 of 12 slices shown]
[im 1/12]
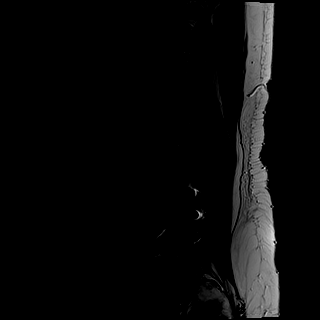
[im 4/12]
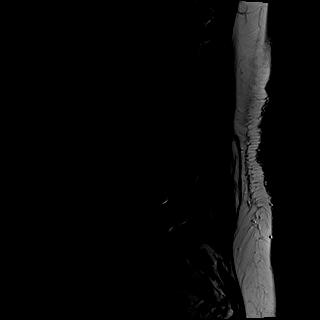
[im 8/12]
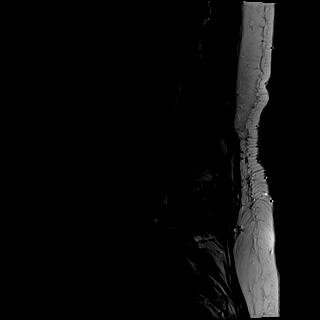
[im 12/12]
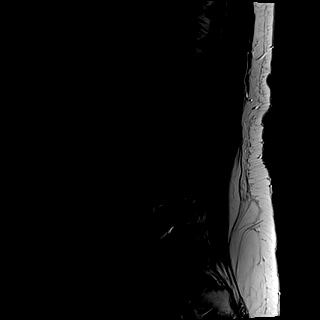

[Series 8: T1 fat-sat post-contrast · sagittal · 4.0mm · 0.88mm/px · 4 of 12 slices shown]
[im 1/12]
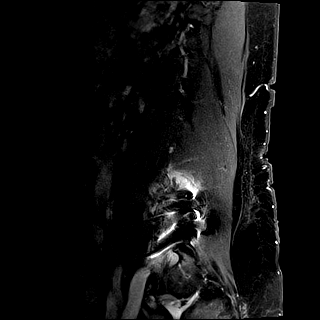
[im 4/12]
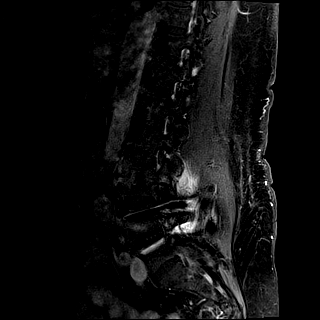
[im 8/12]
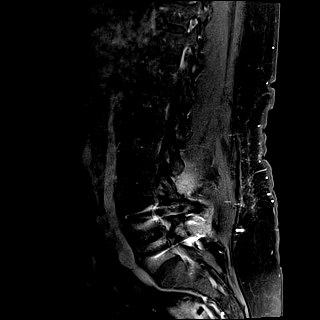
[im 12/12]
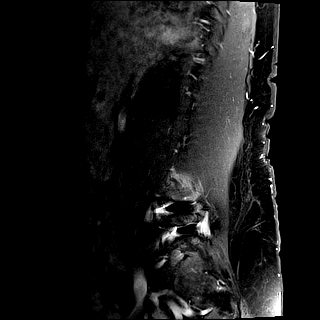

[Series 9: T1 post-contrast · axial · 4.0mm · 0.78mm/px · z∈[-57,+155]mm · 11 of 34 slices shown]
[im 1/34]
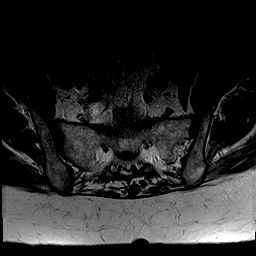
[im 4/34]
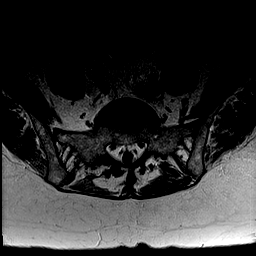
[im 7/34]
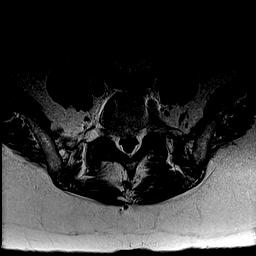
[im 10/34]
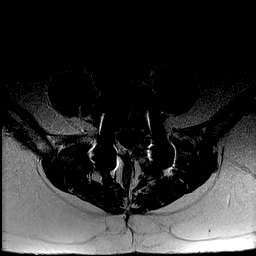
[im 14/34]
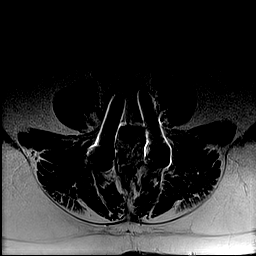
[im 17/34]
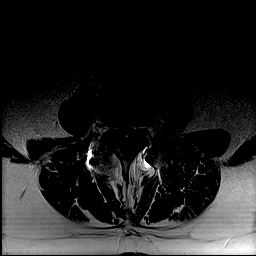
[im 20/34]
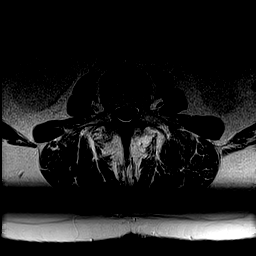
[im 24/34]
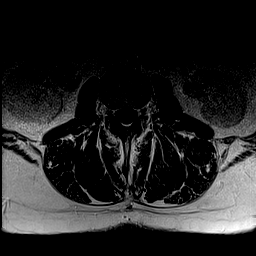
[im 27/34]
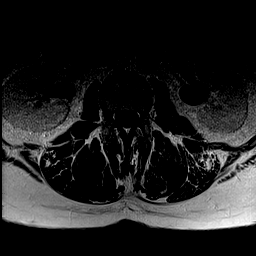
[im 30/34]
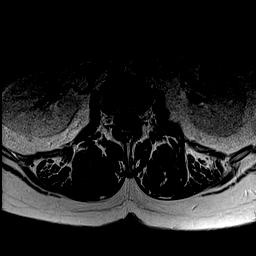
[im 34/34]
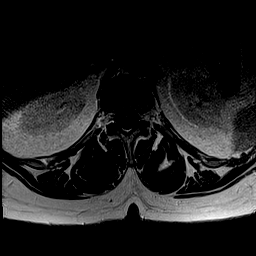

[48 of 48 positions shown; findings below may reference images not displayed]

FINDINGS: Segmentation: For the purposes of this dictation, the most caudal
lumbar type vertebral body is designated L5.

Alignment:  Stable.

Vertebrae: There are postoperative changes at L4-L5 including
pedicle screw fixation, posterior decompression, and interbody
spacer. The hardware is not well evaluated on this study and there
is associated susceptibility artifact. No significant marrow edema.
No suspicious osseous lesion.

Conus medullaris and cauda equina: Conus extends to the L1 level.
Conus and cauda equina appear normal.

Paraspinal and other soft tissues: Chronic postoperative changes.
Otherwise unremarkable.

Disc levels:

L1-L2:  No canal or foraminal stenosis.

L2-L3:  No canal or foraminal stenosis.

L3-L4: Disc bulge and marked facet arthropathy with ligamentum
flavum infolding and joint effusions. Increased marked canal
stenosis. Increased mild to moderate foraminal stenosis.

L4-L5: Operative level. Endplate osteophytes and facet arthropathy.
Canal is decompressed. Minor foraminal stenosis.

L5-S1: Right greater than left facet arthropathy. No significant
canal stenosis. Mild right foraminal stenosis. No significant left
foraminal stenosis.
IMPRESSION: Postoperative changes at L4-L5. Multilevel degenerative changes as
detailed above, most notably at L3-L4 where there is increased
marked canal stenosis.

## 2020-01-11 DIAGNOSIS — M542 Cervicalgia: Secondary | ICD-10-CM | POA: Insufficient documentation

## 2020-01-11 DIAGNOSIS — M5030 Other cervical disc degeneration, unspecified cervical region: Secondary | ICD-10-CM | POA: Diagnosis not present

## 2020-01-11 DIAGNOSIS — M47816 Spondylosis without myelopathy or radiculopathy, lumbar region: Secondary | ICD-10-CM | POA: Diagnosis not present

## 2020-01-11 DIAGNOSIS — M5416 Radiculopathy, lumbar region: Secondary | ICD-10-CM | POA: Diagnosis not present

## 2020-01-11 DIAGNOSIS — I10 Essential (primary) hypertension: Secondary | ICD-10-CM | POA: Diagnosis not present

## 2020-01-11 DIAGNOSIS — M4316 Spondylolisthesis, lumbar region: Secondary | ICD-10-CM | POA: Diagnosis not present

## 2020-01-11 DIAGNOSIS — Z6828 Body mass index (BMI) 28.0-28.9, adult: Secondary | ICD-10-CM | POA: Diagnosis not present

## 2020-01-11 DIAGNOSIS — Z981 Arthrodesis status: Secondary | ICD-10-CM | POA: Diagnosis not present

## 2020-01-16 ENCOUNTER — Telehealth: Payer: Self-pay | Admitting: Internal Medicine

## 2020-01-16 ENCOUNTER — Other Ambulatory Visit: Payer: Self-pay | Admitting: Neurological Surgery

## 2020-01-16 DIAGNOSIS — M5030 Other cervical disc degeneration, unspecified cervical region: Secondary | ICD-10-CM

## 2020-01-16 DIAGNOSIS — M4316 Spondylolisthesis, lumbar region: Secondary | ICD-10-CM

## 2020-01-16 NOTE — Telephone Encounter (Signed)
Copied from [place] [id number]. Topic: Quick Communication - See Telephone Encounter >> Jan 16, 2020  3:55 PM Loma Boston wrote: CRM for notification. See Telephone encounter for: 01/16/20. [employer] agent Modena Nunnery has called re that this pt with medicare uses a power wheelchair of theirs. In order for Medicare to cont. to pay states they need the most recent progress note less than 12 mo or admen. progress note. Pls fax direct to [phone]

## 2020-01-17 ENCOUNTER — Telehealth: Payer: Self-pay

## 2020-01-17 NOTE — Telephone Encounter (Addendum)
Call placed to [facility] # [phone] to confirm what documentation is needed.   Spoke to [employer] who stated that they need a  provider's note within the last 12 months that addresses the need for the [employer],   This CM explained that the provider's note states that the patient uses the motorized wheelchair at home and Neoma Laming stated that would be sufficient.   Documents faxed to # [phone] as requested

## 2020-02-06 ENCOUNTER — Other Ambulatory Visit: Payer: Self-pay

## 2020-02-06 ENCOUNTER — Ambulatory Visit
Admission: RE | Admit: 2020-02-06 | Discharge: 2020-02-06 | Disposition: A | Discharge status: Home / Self Care | Payer: Medicare Other | Source: Ambulatory Visit | Attending: Neurological Surgery | Admitting: Neurological Surgery

## 2020-02-06 DIAGNOSIS — M4316 Spondylolisthesis, lumbar region: Secondary | ICD-10-CM

## 2020-02-06 DIAGNOSIS — M48061 Spinal stenosis, lumbar region without neurogenic claudication: Secondary | ICD-10-CM | POA: Diagnosis not present

## 2020-02-06 DIAGNOSIS — M5030 Other cervical disc degeneration, unspecified cervical region: Secondary | ICD-10-CM

## 2020-02-06 DIAGNOSIS — M5126 Other intervertebral disc displacement, lumbar region: Secondary | ICD-10-CM | POA: Diagnosis not present

## 2020-02-06 DIAGNOSIS — M50223 Other cervical disc displacement at C6-C7 level: Secondary | ICD-10-CM | POA: Diagnosis not present

## 2020-02-06 DIAGNOSIS — M5127 Other intervertebral disc displacement, lumbosacral region: Secondary | ICD-10-CM | POA: Diagnosis not present

## 2020-02-06 DIAGNOSIS — M5021 Other cervical disc displacement,  high cervical region: Secondary | ICD-10-CM | POA: Diagnosis not present

## 2020-02-22 ENCOUNTER — Ambulatory Visit: Payer: Medicare Other | Attending: Internal Medicine | Admitting: Internal Medicine

## 2020-02-22 ENCOUNTER — Other Ambulatory Visit: Payer: Self-pay

## 2020-02-22 DIAGNOSIS — I10 Essential (primary) hypertension: Secondary | ICD-10-CM | POA: Diagnosis not present

## 2020-02-22 DIAGNOSIS — Z23 Encounter for immunization: Secondary | ICD-10-CM | POA: Diagnosis not present

## 2020-02-22 DIAGNOSIS — M545 Low back pain, unspecified: Secondary | ICD-10-CM | POA: Diagnosis not present

## 2020-02-22 DIAGNOSIS — G8929 Other chronic pain: Secondary | ICD-10-CM

## 2020-02-22 DIAGNOSIS — M170 Bilateral primary osteoarthritis of knee: Secondary | ICD-10-CM | POA: Diagnosis not present

## 2020-02-22 DIAGNOSIS — F1490 Cocaine use, unspecified, uncomplicated: Secondary | ICD-10-CM

## 2020-02-22 DIAGNOSIS — R296 Repeated falls: Secondary | ICD-10-CM

## 2020-02-22 DIAGNOSIS — Z9229 Personal history of other drug therapy: Secondary | ICD-10-CM

## 2020-02-22 NOTE — Progress Notes (Signed)
Virtual Visit via Telephone Note Due to current restrictions/limitations of in-office visits due to the COVID-19 pandemic, this scheduled clinical appointment was converted to a telehealth visit  I connected with Shannon [name] on 02/22/20 at 4:36 p.m by telephone and verified that I am speaking with the correct person using two identifiers.  Location: Patient: home Provider: office   I discussed the limitations, risks, security and privacy concerns of performing an evaluation and management service by telephone and the availability of in person appointments. I also discussed with the patient that there may be a patient responsible charge related to this service. The patient expressed understanding and agreed to proceed.   History of Present Illness: Patient with history of HTN,pre-DM,chronic lower back pain ( hx of L4-5 decompression surgery), HL,ETOH use disorder, OA knees, vit D def.  She is being seen at [place] [facility].  Recently had  MRI of c-spine and lumbar spine.  She has a follow-up appointment with the neurosurgeon next week.  OA: reports knees do not bother her much recently.  Uses her mobility chair in the house.  Taking Mobic as prescribed -2 falls last wk at home.  Legs gave out when she went to stand up.  No injuries -UDS positive for Cocaine on last labs done 08/2019.  She admits to using cocaine stating that she was a little stressed out at that time but has not used it since.  I had held off on refilling tramadol.  HTN:  Compliant with Norvasc, HCTZ, Metoprolol and Lisinopril.  No chest pains/LE edema/SOB.  No device to check blood pressure  ETOH:  She has not drank in a wk.  Trying to quit complelety  HM:  Due for flu.  Referred for c-scope on last visit.  Had c[facility]scope [facility]/05/19 at [facility].  Told she will need repeat in 5 yrs.   Completed COVID vaccine Outpatient Encounter Medications as of 02/22/2020  Medication Sig  . amLODipine (NORVASC) 5 MG  tablet Take 1 tablet (5 mg total) by mouth daily.  . Cholecalciferol (VITAMIN D3) 10 MCG (400 UNIT) tablet Take 1 tablet (400 Units total) by mouth daily.  . DULoxetine (CYMBALTA) 20 MG capsule Take 1 capsule (20 mg total) by mouth at bedtime.  . fenofibrate (TRICOR) 48 MG tablet TAKE 1 TABLET(48 MG) BY MOUTH DAILY  . fluticasone (FLONASE) 50 MCG/ACT nasal spray Place 2 sprays into both nostrils daily.  Marland Kitchen gabapentin (NEURONTIN) 300 MG capsule 1 PO q HS, may increase to 1 PO TID if needed/tolerated  . hydrochlorothiazide (HYDRODIURIL) 12.5 MG tablet TAKE 1 TABLET(12.5 MG) BY MOUTH DAILY  . lisinopril (ZESTRIL) 40 MG tablet TAKE 1 TABLET(40 MG) BY MOUTH DAILY  . meloxicam (MOBIC) 15 MG tablet Take 1 tablet (15 mg total) by mouth daily.  . metoprolol succinate (TOPROL-XL) 100 MG 24 hr tablet TAKE 1 TABLET BY MOUTH DAILY WITH OR IMMEDIATELY FOLLOWING A MEAL  . traMADol (ULTRAM) 50 MG tablet Take 1 tablet (50 mg total) by mouth every 8 (eight) hours as needed.   No facility-administered encounter medications on file as of 02/22/2020.      Observations/Objective: Results for orders placed or performed in visit on 09/01/19  [id number] 11+Oxyco+Alc+Crt-Bund  Result Value Ref Range   Ethanol Negative Cutoff=0.020 %   Amphetamines, Urine Negative Cutoff=1000 ng/mL   Barbiturate Negative Cutoff=200 ng/mL   BENZODIAZ UR QL Negative Cutoff=200 ng/mL   Cannabinoid Quant, Ur Negative Cutoff=50 ng/mL   Cocaine (Metabolite) See Final Results Cutoff=300 ng/mL  OPIATE SCREEN URINE Negative Cutoff=300 ng/mL   Oxycodone/Oxymorphone, Urine Negative Cutoff=300 ng/mL   Phencyclidine Negative Cutoff=25 ng/mL   Methadone Screen, Urine Negative Cutoff=300 ng/mL   Propoxyphene Negative Cutoff=300 ng/mL   Meperidine Negative Cutoff=200 ng/mL   Tramadol Negative Cutoff=200 ng/mL   Creatinine 166.7 20.0 - 300.0 mg/dL   pH, Urine 4.9 4.5 - 8.9  CBC  Result Value Ref Range   WBC 5.6 3.4 - 10.8 x10E3/uL   RBC 6.24  (H) 3.77 - 5.28 x10E6/uL   Hemoglobin 14.7 11.1 - 15.9 g/dL   Hematocrit 48.3 (H) 34.0 - 46.6 %   MCV 77 (L) 79 - 97 fL   MCH 23.6 (L) 26.6 - 33.0 pg   MCHC 30.4 (L) 31 - 35 g/dL   RDW 15.2 11.7 - 15.4 %   Platelets 360 150 - 450 x10E3/uL  Comprehensive metabolic panel  Result Value Ref Range   Glucose 90 65 - 99 mg/dL   BUN 13 8 - 27 mg/dL   Creatinine, Ser 1.12 (H) 0.57 - 1.00 mg/dL   GFR calc non Af Amer 53 (L) >59 mL/min/1.73   GFR calc Af Amer 61 >59 mL/min/1.73   BUN/Creatinine Ratio 12 12 - 28   Sodium 145 (H) 134 - 144 mmol/L   Potassium 5.0 3.5 - 5.2 mmol/L   Chloride 107 (H) 96 - 106 mmol/L   CO2 23 20 - 29 mmol/L   Calcium 10.3 8.7 - 10.3 mg/dL   Total Protein 8.0 6.0 - 8.5 g/dL   Albumin 4.5 3.8 - 4.8 g/dL   Globulin, Total 3.5 1.5 - 4.5 g/dL   Albumin/Globulin Ratio 1.3 1.2 - 2.2   Bilirubin Total 0.4 0.0 - 1.2 mg/dL   Alkaline Phosphatase 56 48 - 121 IU/L   AST 23 0 - 40 IU/L   ALT 19 0 - 32 IU/L  Lipid panel  Result Value Ref Range   Cholesterol, Total 195 100 - 199 mg/dL   Triglycerides 61 0 - 149 mg/dL   HDL 82 >39 mg/dL   VLDL Cholesterol Cal 11 5 - 40 mg/dL   LDL Chol Calc (NIH) 102 (H) 0 - 99 mg/dL   Chol/HDL Ratio 2.4 0.0 - 4.4 ratio  Erythropoietin  Result Value Ref Range   Erythropoietin 7.5 2.6 - 18.5 mIU/mL  Specimen status report  Result Value Ref Range   specimen status report Comment   Cocaine Con, Ur  Result Value Ref Range   Cocaine Metab Quant, Ur Positive (A) Cutoff=300   Benzoylecgonine GC/MS Conf >5000 Cutoff=150 ng/mL     Assessment and Plan: 1. Essential hypertension Continue current medications and low-salt diet.  2. Osteoarthritis of both knees, unspecified osteoarthritis type Continue meloxicam.  Weight loss encouraged.  3. Chronic bilateral low back pain without sciatica Followed by neurosurgery.  4. Influenza vaccine needed She will come to get flu shot from the clinical pharmacist.  5. Recurrent  falls Recommended some home physical therapy but patient declined.  6. Cocaine use Strongly discouraged use of street drugs.  She declines seeing a therapist or doing any treatment programs.  She states that it is no longer an issue for her because she no longer uses.  7. COVID-19 vaccine series completed   Follow Up Instructions: 4 months or sooner if any problems develop.   I discussed the assessment and treatment plan with the patient. The patient was provided an opportunity to ask questions and all were answered. The patient agreed with the plan and demonstrated  an understanding of the instructions.   The patient was advised to call back or seek an in-person evaluation if the symptoms worsen or if the condition fails to improve as anticipated.  I provided 13 minutes of non-face-to-face time during this encounter.   Karle Plumber, MD

## 2020-02-26 DIAGNOSIS — M4316 Spondylolisthesis, lumbar region: Secondary | ICD-10-CM | POA: Diagnosis not present

## 2020-02-26 DIAGNOSIS — M5136 Other intervertebral disc degeneration, lumbar region: Secondary | ICD-10-CM | POA: Diagnosis not present

## 2020-02-26 DIAGNOSIS — M48062 Spinal stenosis, lumbar region with neurogenic claudication: Secondary | ICD-10-CM | POA: Diagnosis not present

## 2020-02-26 DIAGNOSIS — Z981 Arthrodesis status: Secondary | ICD-10-CM | POA: Diagnosis not present

## 2020-02-26 DIAGNOSIS — I10 Essential (primary) hypertension: Secondary | ICD-10-CM | POA: Diagnosis not present

## 2020-02-26 DIAGNOSIS — Z6829 Body mass index (BMI) 29.0-29.9, adult: Secondary | ICD-10-CM | POA: Diagnosis not present

## 2020-02-27 ENCOUNTER — Other Ambulatory Visit (HOSPITAL_COMMUNITY): Payer: Self-pay | Admitting: Neurological Surgery

## 2020-02-27 ENCOUNTER — Other Ambulatory Visit: Payer: Self-pay | Admitting: Neurological Surgery

## 2020-02-27 DIAGNOSIS — M4316 Spondylolisthesis, lumbar region: Secondary | ICD-10-CM

## 2020-03-01 ENCOUNTER — Ambulatory Visit (HOSPITAL_COMMUNITY): Payer: Medicare Other

## 2020-03-04 ENCOUNTER — Ambulatory Visit: Payer: Medicare Other | Admitting: Pharmacist

## 2020-03-11 NOTE — Progress Notes (Addendum)
Your procedure is scheduled on Tuesday, March 19, 2020.  Report to [facility] Main Entrance "A" at 8:00 A.M., and check in at the Admitting office.  Call this number if you have problems the morning of surgery:  [phone]  Call [phone] if you have any questions prior to your surgery date Monday-Friday 8am-4pm    Remember:  Do not eat or drink after midnight the night before your surgery    Take these medicines the morning of surgery with A SIP OF WATER: amLODipine (NORVASC) gabapentin (NEURONTIN) metoprolol succinate (TOPROL-XL) pregabalin (LYRICA) fluticasone (FLONASE) - if needed traMADol (ULTRAM) - if needed  As of today, STOP taking any Aspirin (unless otherwise instructed by your surgeon) Aleve, Naproxen, Ibuprofen, Motrin, meloxicam (MOBIC), Advil, Goody's, BC's, all herbal medications, fish oil, and all vitamins.                      Do not wear jewelry, make up, or nail polish            Do not wear lotions, powders, perfumes, or deodorant.            Do not shave 48 hours prior to surgery.            Do not bring valuables to the hospital.            [facility] is not responsible for any belongings or valuables.  Do NOT Smoke (Tobacco/Vaping) or drink Alcohol 24 hours prior to your procedure If you use a CPAP at night, you may bring all equipment for your overnight stay.   Contacts, glasses, dentures or bridgework may not be worn into surgery.      For patients admitted to the hospital, discharge time will be determined by your treatment team.   Patients discharged the day of surgery will not be allowed to drive home, and someone needs to stay with them for 24 hours.    Special instructions:   [place]- Preparing For Surgery  Before surgery, you can play an important role. Because skin is not sterile, your skin needs to be as free of germs as possible. You can reduce the number of germs on your skin by washing with CHG (chlorahexidine gluconate) Soap  before surgery.  CHG is an antiseptic cleaner which kills germs and bonds with the skin to continue killing germs even after washing.    Oral Hygiene is also important to reduce your risk of infection.  Remember - BRUSH YOUR TEETH THE MORNING OF SURGERY WITH YOUR REGULAR TOOTHPASTE  Please do not use if you have an allergy to CHG or antibacterial soaps. If your skin becomes reddened/irritated stop using the CHG.  Do not shave (including legs and underarms) for at least 48 hours prior to first CHG shower. It is OK to shave your face.  Please follow these instructions carefully.   1. Shower the NIGHT BEFORE SURGERY and the MORNING OF SURGERY with CHG Soap.   2. If you chose to wash your hair, wash your hair first as usual with your normal shampoo.  3. After you shampoo, rinse your hair and body thoroughly to remove the shampoo.  4. Use CHG as you would any other liquid soap. You can apply CHG directly to the skin and wash gently with a scrungie or a clean washcloth.   5. Apply the CHG Soap to your body ONLY FROM THE NECK DOWN.  Do not use on open wounds or open sores. Avoid  contact with your eyes, ears, mouth and genitals (private parts). Wash Face and genitals (private parts)  with your normal soap.   6. Wash thoroughly, paying special attention to the area where your surgery will be performed.  7. Thoroughly rinse your body with warm water from the neck down.  8. DO NOT shower/wash with your normal soap after using and rinsing off the CHG Soap.  9. Pat yourself dry with a CLEAN TOWEL.  10. Wear CLEAN PAJAMAS to bed the night before surgery  11. Place CLEAN SHEETS on your bed the night of your first shower and DO NOT SLEEP WITH PETS.   Day of Surgery: Wear Clean/Comfortable clothing the morning of surgery Do not apply any deodorants/lotions.   Remember to brush your teeth WITH YOUR REGULAR TOOTHPASTE.   Please read over the following fact sheets that you were given.

## 2020-03-12 ENCOUNTER — Encounter (HOSPITAL_COMMUNITY)
Admission: RE | Admit: 2020-03-12 | Discharge: 2020-03-12 | Disposition: A | Discharge status: Home / Self Care | Payer: Medicare Other | Source: Ambulatory Visit | Attending: Neurological Surgery | Admitting: Neurological Surgery

## 2020-03-12 ENCOUNTER — Encounter (HOSPITAL_COMMUNITY): Payer: Self-pay

## 2020-03-12 ENCOUNTER — Other Ambulatory Visit (HOSPITAL_COMMUNITY): Payer: Medicare Other

## 2020-03-12 ENCOUNTER — Other Ambulatory Visit: Payer: Self-pay

## 2020-03-12 DIAGNOSIS — Z01818 Encounter for other preprocedural examination: Secondary | ICD-10-CM | POA: Diagnosis not present

## 2020-03-12 HISTORY — DX: Unspecified osteoarthritis, unspecified site: M19.90

## 2020-03-12 HISTORY — DX: Pneumonia, unspecified organism: J18.9

## 2020-03-12 LAB — COMPREHENSIVE METABOLIC PANEL
ALT: 28 U/L (ref 0–44)
AST: 21 U/L (ref 15–41)
Albumin: 3.3 g/dL — ABNORMAL LOW (ref 3.5–5.0)
Alkaline Phosphatase: 57 U/L (ref 38–126)
Anion gap: 10 (ref 5–15)
BUN: 14 mg/dL (ref 8–23)
CO2: 23 mmol/L (ref 22–32)
Calcium: 9.6 mg/dL (ref 8.9–10.3)
Chloride: 107 mmol/L (ref 98–111)
Creatinine, Ser: 0.76 mg/dL (ref 0.44–1.00)
GFR, Estimated: >60 mL/min (ref >60)
Glucose, Bld: 98 mg/dL (ref 70–99)
Potassium: 4.5 mmol/L (ref 3.5–5.1)
Sodium: 140 mmol/L (ref 135–145)
Total Bilirubin: 0.6 mg/dL (ref 0.3–1.2)
Total Protein: 8.3 g/dL — ABNORMAL HIGH (ref 6.5–8.1)

## 2020-03-12 LAB — CBC
HCT: 46.4 % — ABNORMAL HIGH (ref 36.0–46.0)
Hemoglobin: 13.9 g/dL (ref 12.0–15.0)
MCH: 22.9 pg — ABNORMAL LOW (ref 26.0–34.0)
MCHC: 30 g/dL (ref 30.0–36.0)
MCV: 76.6 fL — ABNORMAL LOW (ref 80.0–100.0)
Platelets: 409 10*3/uL — ABNORMAL HIGH (ref 150–400)
RBC: 6.06 MIL/uL — ABNORMAL HIGH (ref 3.87–5.11)
RDW: 14.6 % (ref 11.5–15.5)
WBC: 7.4 10*3/uL (ref 4.0–10.5)
nRBC: 0 % (ref 0.0–0.2)

## 2020-03-12 LAB — PROTIME-INR
INR: 1 (ref 0.8–1.2)
Prothrombin Time: 12.8 seconds (ref 11.4–15.2)

## 2020-03-12 LAB — TYPE AND SCREEN
ABO/RH(D): B POS
Antibody Screen: NEGATIVE

## 2020-03-12 LAB — SURGICAL PCR SCREEN
MRSA, PCR: NEGATIVE
Staphylococcus aureus: NEGATIVE

## 2020-03-12 NOTE — Progress Notes (Signed)
PCP - Dr. Karle Plumber Cardiologist - Denies  PPM/ICD - Denies  Chest x-ray - N/A EKG - 03/12/20 Stress Test - Denies ECHO - Denies Cardiac Cath - Denies  Sleep Study - Denies  Patient denies having diabetes.  Blood Thinner Instructions: N/A Aspirin Instructions: N/A  ERAS Protcol - No PRE-SURGERY Ensure or G2- No  COVID TEST- 03/18/20   Anesthesia review: No  Patient denies shortness of breath, fever, cough and chest pain at PAT appointment   All instructions explained to the patient, with a verbal understanding of the material. Patient agrees to go over the instructions while at home for a better understanding. Patient also instructed to self quarantine after being tested for COVID-19. The opportunity to ask questions was provided.

## 2020-03-13 ENCOUNTER — Other Ambulatory Visit: Payer: Self-pay | Admitting: Neurological Surgery

## 2020-03-13 NOTE — Progress Notes (Signed)
Anesthesia Chart Review:  Case: [id number] Date/Time: 03/19/20 0955   Procedure: TLIF L3-L4 WITH REVISION OF HARDWARE L4-L5 (N/A )   Anesthesia type: General   Pre-op diagnosis: SPONDYLOLISTHESIS, LUMBAR REGION   Location: [facility] OR ROOM 9 / [place] OR   Surgeons: Dawley, Theodoro Doing, DO      DISCUSSION: Patient is a 64 year old female scheduled for the above procedure.  History includes former smoker, HTN, HLD, peripheral edema, ventral hernia, dizziness (2015), left distal femur fracture (s/p IM nailing 09/05/16), ventral hernia repair (05/24/13), back surgery (L4-5 posterior/posterolateral arthrodesis 09/01/10).   Telemedicine visit with Ladell Pier, MD on 02/22/20. Compliant with HTN medications. No chest pain, LE edema, or SOB. She had been cutting down on alcohol use (had been drinking 2 16 oz beers/day). Reported she had not used cocaine since May, and therefore declined need for therapist or treatment program. Reportedly had colonoscopy through [facility] on 10/05/19. She had upcoming neurosurgery visit then to discuss recent imaging for bilateral back pain and recurrent falls. Notes indicate that she uses a motorized wheelchair when out of the house and cane at home.  EKG showed NSR, septal infarct. Denied chest pain and SOB. No known CAD history. She is on amlodipine, HCTZ, lisinopril, metoprolol succinate for HTN. No reported history of DM with glucose 98 at PAT. A1c 5.7% last year.    Reviewed above with anesthesiologist Hoy Morn, MD. Anesthesiolgoist to evaluate on the day of surgery. Presurgical COVID-19 test is scheduled for 03/18/2020.   VS: BP (!) 147/95   Pulse 67   Temp 36.8 C (Oral)   Resp 18   Ht 5\' 5"  (1.651 m)   Wt 76.9 kg   SpO2 100%   BMI 28.22 kg/m    PROVIDERS: Ladell Pier, MD is PCP    LABS: Labs reviewed: Acceptable for surgery. A1c 5.7% 01/05/19. (all labs ordered are listed, but only abnormal results are displayed)  Labs Reviewed  CBC -  Abnormal; Notable for the following components:      Result Value   RBC 6.06 (*)    HCT 46.4 (*)    MCV 76.6 (*)    MCH 22.9 (*)    Platelets 409 (*)    All other components within normal limits  COMPREHENSIVE METABOLIC PANEL - Abnormal; Notable for the following components:   Total Protein 8.3 (*)    Albumin 3.3 (*)    All other components within normal limits  SURGICAL PCR SCREEN  PROTIME-INR  TYPE AND SCREEN    IMAGES: CT L-spine is scheduled for 03/14/20 per Dr. Reatha Armour.  MRI C/L spine 02/06/20: IMPRESSION: MRI cervical spine: - Left C6-7 foraminal protrusion with mild spinal canal and moderate left neural foraminal narrowing. - Mild bilateral C3-5 neural foraminal narrowing. MRI lumbar spine: - Sequela of L4-5 posterior decompression and fusion. Patent spinal canal and neural foramen at this level. - Marked spinal canal and moderate bilateral neural foraminal narrowing at the L3-4 level is unchanged. - Mild bilateral L5-S1 neural foraminal narrowing, unchanged.   EKG: 03/12/20: Normal sinus rhythm Septal infarct , age undetermined Abnormal ECG Confirmed by Sherren Mocha [phone]) on 03/12/2020 10:49:48 PM   CV: Denied prior stress test, echocardiogram, cardiac cath   Past Medical History:  Diagnosis Date  . Arthritis   . Cataract 12/2017   Dr. Schuyler Amor  . Dizziness    occasionally  . Headache   . Hyperlipidemia    takes Tricor daily  . Hypertension    takes  Lisinopril and Metoprolol daily  . Hypertensive retinopathy 12/2017   Dr. Schuyler Amor  . Numbness    stinging in right hand  . Peripheral edema    takes lasix daily  . Pneumonia   . Ventral hernia     Past Surgical History:  Procedure Laterality Date  . ABDOMINAL HYSTERECTOMY    . BACK SURGERY  09/01/2010   L4-5 lumbar decompression including laminotomy, facetectomy,   . colonscopy    . FEMUR IM NAIL Left 09/05/2016  . FEMUR IM NAIL Left 09/05/2016   Procedure: INTRAMEDULLARY (IM) NAIL FEMORAL;   Surgeon: Nicholes Stairs, MD;  Location: [facility];  Service: Orthopedics;  Laterality: Left;  . FRACTURE SURGERY     left leg  . INSERTION OF MESH N/A 05/24/2013   Procedure: INSERTION OF MESH;  Surgeon: Imogene Burn. Georgette Dover, MD;  Location: [facility];  Service: General;  Laterality: N/A;  . UTERINE FIBROID SURGERY    . VENTRAL HERNIA REPAIR N/A 05/24/2013   Procedure: OPEN REPAIR EPIGASTRIC VENTRAL HERNIA;  Surgeon: Imogene Burn. Georgette Dover, MD;  Location: [facility];  Service: General;  Laterality: N/A;    MEDICATIONS: . amLODipine (NORVASC) 5 MG tablet  . fluticasone (FLONASE) 50 MCG/ACT nasal spray  . gabapentin (NEURONTIN) 300 MG capsule  . hydrochlorothiazide (HYDRODIURIL) 12.5 MG tablet  . lisinopril (ZESTRIL) 40 MG tablet  . meloxicam (MOBIC) 15 MG tablet  . metoprolol succinate (TOPROL-XL) 100 MG 24 hr tablet  . pregabalin (LYRICA) 50 MG capsule  . traMADol (ULTRAM) 50 MG tablet   No current facility-administered medications for this encounter.    Myra Gianotti, PA-C Surgical Short Stay/Anesthesiology [facility] Phone [phone] [facility] Phone [phone] 03/13/2020 6:00 PM

## 2020-03-13 NOTE — Anesthesia Preprocedure Evaluation (Addendum)
Anesthesia Evaluation  Patient identified by MRN, date of birth, ID band Patient awake    Reviewed: Allergy & Precautions, NPO status   Airway Mallampati: II  TM Distance: >3 FB     Dental   Pulmonary pneumonia, former smoker,    breath sounds clear to auscultation       Cardiovascular hypertension,  Rhythm:Regular Rate:Normal     Neuro/Psych  Headaches, PSYCHIATRIC DISORDERS Depression    GI/Hepatic negative GI ROS, Neg liver ROS,   Endo/Other  negative endocrine ROS  Renal/GU Renal disease     Musculoskeletal  (+) Arthritis ,   Abdominal   Peds  Hematology negative hematology ROS (+)   Anesthesia Other Findings   Reproductive/Obstetrics                            Anesthesia Physical Anesthesia Plan  ASA: III  Anesthesia Plan: General   Post-op Pain Management:    Induction: Intravenous  PONV Risk Score and Plan: 3 and Ondansetron, Dexamethasone and Midazolam  Airway Management Planned: Oral ETT  Additional Equipment:   Intra-op Plan:   Post-operative Plan: Extubation in OR  Informed Consent: I have reviewed the patients History and Physical, chart, labs and discussed the procedure including the risks, benefits and alternatives for the proposed anesthesia with the patient or authorized representative who has indicated his/her understanding and acceptance.       Plan Discussed with: CRNA and Anesthesiologist  Anesthesia Plan Comments: (PAT note written 03/13/2020 by Myra Gianotti, PA-C. )       Anesthesia Quick Evaluation

## 2020-03-14 ENCOUNTER — Encounter (HOSPITAL_COMMUNITY): Payer: Self-pay

## 2020-03-14 ENCOUNTER — Ambulatory Visit (HOSPITAL_COMMUNITY): Payer: Medicare Other

## 2020-03-15 ENCOUNTER — Other Ambulatory Visit (HOSPITAL_COMMUNITY): Payer: Medicare Other

## 2020-03-18 ENCOUNTER — Telehealth: Payer: Self-pay

## 2020-03-18 ENCOUNTER — Other Ambulatory Visit (HOSPITAL_COMMUNITY)
Admission: RE | Admit: 2020-03-18 | Discharge: 2020-03-18 | Disposition: A | Discharge status: Home / Self Care | Payer: Medicare Other | Source: Ambulatory Visit | Attending: Neurological Surgery | Admitting: Neurological Surgery

## 2020-03-18 DIAGNOSIS — Z20822 Contact with and (suspected) exposure to covid-19: Secondary | ICD-10-CM | POA: Insufficient documentation

## 2020-03-18 DIAGNOSIS — Z01812 Encounter for preprocedural laboratory examination: Secondary | ICD-10-CM | POA: Insufficient documentation

## 2020-03-18 LAB — SARS CORONAVIRUS 2 (TAT 6-24 HRS): SARS Coronavirus 2: NEGATIVE

## 2020-03-18 NOTE — Telephone Encounter (Signed)
Medical clearance was faxed back on 11/19. Returned [place] call and she states they did receive fax and she had to put an order in to IT on why it took so long to update to pt chart. Per Antony Madura she has everything.

## 2020-03-18 NOTE — Telephone Encounter (Signed)
Copied from [place] [phone]. Topic: General - Other >> Mar 15, 2020 12:06 PM Leward Quan A wrote: Reason for CRM: Antony Madura with Dr Rubbie Battiest office called to say that patient is scheduled for surgery on 03/19/20 clearance for has been faxed over since November but they have yet to receive it back. Asking for a call back please so they can know this form will be back in time or if she need to resend the form again. Please call Antony Madura at  Ph# [phone] ext# [phone]

## 2020-03-19 ENCOUNTER — Inpatient Hospital Stay (HOSPITAL_COMMUNITY): Payer: Medicare Other | Admitting: Vascular Surgery

## 2020-03-19 ENCOUNTER — Encounter (HOSPITAL_COMMUNITY): Payer: Self-pay | Admitting: Neurological Surgery

## 2020-03-19 ENCOUNTER — Encounter (HOSPITAL_COMMUNITY)
Admission: RE | Disposition: A | Discharge status: Home Health | Payer: Self-pay | Source: Home / Self Care | Attending: Neurological Surgery

## 2020-03-19 ENCOUNTER — Other Ambulatory Visit: Payer: Self-pay

## 2020-03-19 ENCOUNTER — Inpatient Hospital Stay (HOSPITAL_COMMUNITY): Payer: Medicare Other

## 2020-03-19 ENCOUNTER — Inpatient Hospital Stay (HOSPITAL_COMMUNITY)
Admission: RE | Admit: 2020-03-19 | Discharge: 2020-03-22 | DRG: 455 | Disposition: A | Discharge status: Home Health | Payer: Medicare Other | Attending: Neurological Surgery | Admitting: Neurological Surgery

## 2020-03-19 ENCOUNTER — Inpatient Hospital Stay (HOSPITAL_COMMUNITY): Payer: Medicare Other | Admitting: Certified Registered Nurse Anesthetist

## 2020-03-19 DIAGNOSIS — M5136 Other intervertebral disc degeneration, lumbar region: Secondary | ICD-10-CM

## 2020-03-19 DIAGNOSIS — E785 Hyperlipidemia, unspecified: Secondary | ICD-10-CM | POA: Diagnosis present

## 2020-03-19 DIAGNOSIS — M1990 Unspecified osteoarthritis, unspecified site: Secondary | ICD-10-CM | POA: Diagnosis present

## 2020-03-19 DIAGNOSIS — M4316 Spondylolisthesis, lumbar region: Secondary | ICD-10-CM | POA: Diagnosis present

## 2020-03-19 DIAGNOSIS — Z01818 Encounter for other preprocedural examination: Secondary | ICD-10-CM | POA: Diagnosis not present

## 2020-03-19 DIAGNOSIS — M5416 Radiculopathy, lumbar region: Secondary | ICD-10-CM | POA: Diagnosis present

## 2020-03-19 DIAGNOSIS — I10 Essential (primary) hypertension: Secondary | ICD-10-CM | POA: Diagnosis present

## 2020-03-19 DIAGNOSIS — M47816 Spondylosis without myelopathy or radiculopathy, lumbar region: Secondary | ICD-10-CM | POA: Diagnosis not present

## 2020-03-19 DIAGNOSIS — Z87891 Personal history of nicotine dependence: Secondary | ICD-10-CM

## 2020-03-19 DIAGNOSIS — Z79899 Other long term (current) drug therapy: Secondary | ICD-10-CM

## 2020-03-19 DIAGNOSIS — Z90710 Acquired absence of both cervix and uterus: Secondary | ICD-10-CM | POA: Diagnosis not present

## 2020-03-19 DIAGNOSIS — R059 Cough, unspecified: Secondary | ICD-10-CM | POA: Diagnosis not present

## 2020-03-19 DIAGNOSIS — E559 Vitamin D deficiency, unspecified: Secondary | ICD-10-CM | POA: Diagnosis not present

## 2020-03-19 DIAGNOSIS — Z885 Allergy status to narcotic agent status: Secondary | ICD-10-CM | POA: Diagnosis not present

## 2020-03-19 DIAGNOSIS — M47817 Spondylosis without myelopathy or radiculopathy, lumbosacral region: Secondary | ICD-10-CM | POA: Diagnosis not present

## 2020-03-19 DIAGNOSIS — Z20822 Contact with and (suspected) exposure to covid-19: Secondary | ICD-10-CM | POA: Diagnosis present

## 2020-03-19 DIAGNOSIS — Z791 Long term (current) use of non-steroidal anti-inflammatories (NSAID): Secondary | ICD-10-CM | POA: Diagnosis not present

## 2020-03-19 DIAGNOSIS — Z981 Arthrodesis status: Secondary | ICD-10-CM

## 2020-03-19 DIAGNOSIS — M4326 Fusion of spine, lumbar region: Secondary | ICD-10-CM | POA: Diagnosis not present

## 2020-03-19 DIAGNOSIS — Z419 Encounter for procedure for purposes other than remedying health state, unspecified: Principal | ICD-10-CM

## 2020-03-19 DIAGNOSIS — M48061 Spinal stenosis, lumbar region without neurogenic claudication: Principal | ICD-10-CM | POA: Diagnosis present

## 2020-03-19 DIAGNOSIS — M5116 Intervertebral disc disorders with radiculopathy, lumbar region: Secondary | ICD-10-CM | POA: Diagnosis not present

## 2020-03-19 DIAGNOSIS — Z8701 Personal history of pneumonia (recurrent): Secondary | ICD-10-CM

## 2020-03-19 HISTORY — PX: TRANSFORAMINAL LUMBAR INTERBODY FUSION W/ MIS 1 LEVEL: SHX6145

## 2020-03-19 LAB — CBC
HCT: 33.2 % — ABNORMAL LOW (ref 36.0–46.0)
Hemoglobin: 10.8 g/dL — ABNORMAL LOW (ref 12.0–15.0)
MCH: 24 pg — ABNORMAL LOW (ref 26.0–34.0)
MCHC: 32.5 g/dL (ref 30.0–36.0)
MCV: 73.8 fL — ABNORMAL LOW (ref 80.0–100.0)
Platelets: 326 10*3/uL (ref 150–400)
RBC: 4.5 MIL/uL (ref 3.87–5.11)
RDW: 14.6 % (ref 11.5–15.5)
WBC: 9.4 10*3/uL (ref 4.0–10.5)
nRBC: 0 % (ref 0.0–0.2)

## 2020-03-19 LAB — CREATININE, SERUM
Creatinine, Ser: 0.98 mg/dL (ref 0.44–1.00)
GFR, Estimated: >60 mL/min (ref >60)

## 2020-03-19 SURGERY — MINIMALLY INVASIVE (MIS) TRANSFORAMINAL LUMBAR INTERBODY FUSION (TLIF) 1 LEVEL
Anesthesia: General

## 2020-03-19 MED ORDER — ACETAMINOPHEN 325 MG PO TABS
650.0000 mg | ORAL_TABLET | ORAL | Status: DC | PRN
  Administered 2020-03-21 (×2): 650 mg via ORAL
  Filled 2020-03-19 (×2): qty 2

## 2020-03-19 MED ORDER — THROMBIN 5000 UNITS EX SOLR
CUTANEOUS | Status: AC
  Filled 2020-03-19: qty 10000

## 2020-03-19 MED ORDER — 0.9 % SODIUM CHLORIDE (POUR BTL) OPTIME
TOPICAL | Status: DC | PRN
  Administered 2020-03-19: 1000 mL

## 2020-03-19 MED ORDER — ROCURONIUM BROMIDE 10 MG/ML (PF) SYRINGE
PREFILLED_SYRINGE | INTRAVENOUS | Status: DC | PRN
  Administered 2020-03-19: 30 mg via INTRAVENOUS
  Administered 2020-03-19 (×4): 10 mg via INTRAVENOUS

## 2020-03-19 MED ORDER — BUPIVACAINE-EPINEPHRINE 0.5% -1:200000 IJ SOLN
INTRAMUSCULAR | Status: AC
  Filled 2020-03-19: qty 1

## 2020-03-19 MED ORDER — LIDOCAINE HCL (PF) 2 % IJ SOLN
INTRAMUSCULAR | Status: AC
  Filled 2020-03-19: qty 5

## 2020-03-19 MED ORDER — PROPOFOL 10 MG/ML IV BOLUS
INTRAVENOUS | Status: AC
  Filled 2020-03-19: qty 20

## 2020-03-19 MED ORDER — SENNOSIDES-DOCUSATE SODIUM 8.6-50 MG PO TABS
1.0000 | ORAL_TABLET | Freq: Every evening | ORAL | Status: DC | PRN
  Administered 2020-03-22: 1 via ORAL
  Filled 2020-03-19: qty 1

## 2020-03-19 MED ORDER — ONDANSETRON HCL 4 MG/2ML IJ SOLN
INTRAMUSCULAR | Status: AC
  Filled 2020-03-19: qty 2

## 2020-03-19 MED ORDER — CHLORHEXIDINE GLUCONATE CLOTH 2 % EX PADS: 6.0000 | MEDICATED_PAD | Freq: Once | CUTANEOUS | Status: DC

## 2020-03-19 MED ORDER — ONDANSETRON HCL 4 MG/2ML IJ SOLN
INTRAMUSCULAR | Status: DC | PRN
  Administered 2020-03-19: 4 mg via INTRAVENOUS

## 2020-03-19 MED ORDER — LIDOCAINE 2% (20 MG/ML) 5 ML SYRINGE
INTRAMUSCULAR | Status: DC | PRN
  Administered 2020-03-19: 40 mg via INTRAVENOUS

## 2020-03-19 MED ORDER — SODIUM CHLORIDE 0.9 % IV SOLN: INTRAVENOUS | Status: DC

## 2020-03-19 MED ORDER — CHLORHEXIDINE GLUCONATE 0.12 % MT SOLN
OROMUCOSAL | Status: AC
  Administered 2020-03-19: 15 mL via OROMUCOSAL
  Filled 2020-03-19: qty 15

## 2020-03-19 MED ORDER — MIDAZOLAM HCL 2 MG/2ML IJ SOLN
INTRAMUSCULAR | Status: DC | PRN
  Administered 2020-03-19: 2 mg via INTRAVENOUS

## 2020-03-19 MED ORDER — THROMBIN 5000 UNITS EX SOLR
CUTANEOUS | Status: DC | PRN
  Administered 2020-03-19: 10000 [IU] via TOPICAL

## 2020-03-19 MED ORDER — SODIUM CHLORIDE 0.9 % IV SOLN: 250.0000 mL | INTRAVENOUS | Status: DC

## 2020-03-19 MED ORDER — ACETAMINOPHEN 10 MG/ML IV SOLN
INTRAVENOUS | Status: AC
  Filled 2020-03-19: qty 100

## 2020-03-19 MED ORDER — SUGAMMADEX SODIUM 200 MG/2ML IV SOLN
INTRAVENOUS | Status: DC | PRN
  Administered 2020-03-19: 200 mg via INTRAVENOUS

## 2020-03-19 MED ORDER — CEFAZOLIN SODIUM-DEXTROSE 2-4 GM/100ML-% IV SOLN
2.0000 g | INTRAVENOUS | Status: AC
  Administered 2020-03-19 (×2): 2 g via INTRAVENOUS

## 2020-03-19 MED ORDER — ACETAMINOPHEN 650 MG RE SUPP: 650.0000 mg | RECTAL | Status: DC | PRN

## 2020-03-19 MED ORDER — DEXMEDETOMIDINE (PRECEDEX) IN NS 20 MCG/5ML (4 MCG/ML) IV SYRINGE
PREFILLED_SYRINGE | INTRAVENOUS | Status: DC | PRN
  Administered 2020-03-19: 4 ug via INTRAVENOUS
  Administered 2020-03-19 (×3): 8 ug via INTRAVENOUS

## 2020-03-19 MED ORDER — CEFAZOLIN SODIUM-DEXTROSE 2-4 GM/100ML-% IV SOLN
INTRAVENOUS | Status: AC
  Filled 2020-03-19: qty 100

## 2020-03-19 MED ORDER — ALBUMIN HUMAN 5 % IV SOLN: INTRAVENOUS | Status: DC | PRN

## 2020-03-19 MED ORDER — MENTHOL 3 MG MT LOZG
1.0000 | LOZENGE | OROMUCOSAL | Status: DC | PRN
  Filled 2020-03-19: qty 9

## 2020-03-19 MED ORDER — GABAPENTIN 300 MG PO CAPS
300.0000 mg | ORAL_CAPSULE | Freq: Three times a day (TID) | ORAL | Status: DC
  Administered 2020-03-19 – 2020-03-22 (×8): 300 mg via ORAL
  Filled 2020-03-19 (×8): qty 1

## 2020-03-19 MED ORDER — PHENYLEPHRINE 40 MCG/ML (10ML) SYRINGE FOR IV PUSH (FOR BLOOD PRESSURE SUPPORT)
PREFILLED_SYRINGE | INTRAVENOUS | Status: DC | PRN
  Administered 2020-03-19: 40 ug via INTRAVENOUS
  Administered 2020-03-19: 80 ug via INTRAVENOUS
  Administered 2020-03-19: 120 ug via INTRAVENOUS

## 2020-03-19 MED ORDER — METHOCARBAMOL 500 MG PO TABS
500.0000 mg | ORAL_TABLET | Freq: Four times a day (QID) | ORAL | Status: DC | PRN
  Administered 2020-03-20 – 2020-03-21 (×2): 500 mg via ORAL
  Filled 2020-03-19 (×2): qty 1

## 2020-03-19 MED ORDER — FENTANYL CITRATE (PF) 250 MCG/5ML IJ SOLN
INTRAMUSCULAR | Status: DC | PRN
  Administered 2020-03-19 (×5): 50 ug via INTRAVENOUS
  Administered 2020-03-19: 100 ug via INTRAVENOUS
  Administered 2020-03-19 (×3): 50 ug via INTRAVENOUS

## 2020-03-19 MED ORDER — ONDANSETRON HCL 4 MG PO TABS: 4.0000 mg | ORAL_TABLET | Freq: Four times a day (QID) | ORAL | Status: DC | PRN

## 2020-03-19 MED ORDER — FENTANYL CITRATE (PF) 250 MCG/5ML IJ SOLN
INTRAMUSCULAR | Status: AC
  Filled 2020-03-19: qty 5

## 2020-03-19 MED ORDER — PHENOL 1.4 % MT LIQD: 1.0000 | OROMUCOSAL | Status: DC | PRN

## 2020-03-19 MED ORDER — PHENYLEPHRINE 40 MCG/ML (10ML) SYRINGE FOR IV PUSH (FOR BLOOD PRESSURE SUPPORT)
PREFILLED_SYRINGE | INTRAVENOUS | Status: AC
  Filled 2020-03-19: qty 10

## 2020-03-19 MED ORDER — SUCCINYLCHOLINE CHLORIDE 200 MG/10ML IV SOSY
PREFILLED_SYRINGE | INTRAVENOUS | Status: AC
  Filled 2020-03-19: qty 10

## 2020-03-19 MED ORDER — BUPIVACAINE-EPINEPHRINE (PF) 0.5% -1:200000 IJ SOLN
INTRAMUSCULAR | Status: DC | PRN
  Administered 2020-03-19: 20 mL

## 2020-03-19 MED ORDER — HYDROCODONE-ACETAMINOPHEN 10-325 MG PO TABS
1.0000 | ORAL_TABLET | ORAL | Status: DC | PRN
  Administered 2020-03-19 – 2020-03-20 (×2): 1 via ORAL
  Filled 2020-03-19 (×2): qty 1

## 2020-03-19 MED ORDER — METOPROLOL SUCCINATE ER 100 MG PO TB24
100.0000 mg | ORAL_TABLET | Freq: Every day | ORAL | Status: DC
  Administered 2020-03-20 – 2020-03-21 (×2): 100 mg via ORAL
  Filled 2020-03-19 (×2): qty 1

## 2020-03-19 MED ORDER — HYDROCHLOROTHIAZIDE 12.5 MG PO CAPS
12.5000 mg | ORAL_CAPSULE | Freq: Every day | ORAL | Status: DC
  Administered 2020-03-20 – 2020-03-22 (×3): 12.5 mg via ORAL
  Filled 2020-03-19 (×3): qty 1

## 2020-03-19 MED ORDER — DEXMEDETOMIDINE (PRECEDEX) IN NS 20 MCG/5ML (4 MCG/ML) IV SYRINGE
PREFILLED_SYRINGE | INTRAVENOUS | Status: AC
  Filled 2020-03-19: qty 5

## 2020-03-19 MED ORDER — HEMOSTATIC AGENTS (NO CHARGE) OPTIME
TOPICAL | Status: DC | PRN
  Administered 2020-03-19: 1 via TOPICAL

## 2020-03-19 MED ORDER — ROCURONIUM BROMIDE 10 MG/ML (PF) SYRINGE
PREFILLED_SYRINGE | INTRAVENOUS | Status: AC
  Filled 2020-03-19: qty 10

## 2020-03-19 MED ORDER — PHENYLEPHRINE HCL-NACL 10-0.9 MG/250ML-% IV SOLN
INTRAVENOUS | Status: DC | PRN
  Administered 2020-03-19: 40 ug/min via INTRAVENOUS

## 2020-03-19 MED ORDER — LACTATED RINGERS IV SOLN: INTRAVENOUS | Status: DC

## 2020-03-19 MED ORDER — ALUM & MAG HYDROXIDE-SIMETH 200-200-20 MG/5ML PO SUSP: 30.0000 mL | Freq: Four times a day (QID) | ORAL | Status: DC | PRN

## 2020-03-19 MED ORDER — CHLORHEXIDINE GLUCONATE 0.12 % MT SOLN: 15.0000 mL | Freq: Once | OROMUCOSAL | Status: AC

## 2020-03-19 MED ORDER — ONDANSETRON HCL 4 MG/2ML IJ SOLN: 4.0000 mg | Freq: Four times a day (QID) | INTRAMUSCULAR | Status: DC | PRN

## 2020-03-19 MED ORDER — SODIUM CHLORIDE 0.9% FLUSH
3.0000 mL | Freq: Two times a day (BID) | INTRAVENOUS | Status: DC
  Administered 2020-03-19 – 2020-03-22 (×5): 3 mL via INTRAVENOUS

## 2020-03-19 MED ORDER — THROMBIN 5000 UNITS EX SOLR
OROMUCOSAL | Status: DC | PRN
  Administered 2020-03-19: 5 mL via TOPICAL

## 2020-03-19 MED ORDER — FENTANYL CITRATE (PF) 100 MCG/2ML IJ SOLN
INTRAMUSCULAR | Status: AC
  Filled 2020-03-19: qty 2

## 2020-03-19 MED ORDER — PROPOFOL 10 MG/ML IV BOLUS
INTRAVENOUS | Status: DC | PRN
  Administered 2020-03-19: 170 mg via INTRAVENOUS

## 2020-03-19 MED ORDER — DEXAMETHASONE SODIUM PHOSPHATE 10 MG/ML IJ SOLN
INTRAMUSCULAR | Status: DC | PRN
  Administered 2020-03-19: 5 mg via INTRAVENOUS

## 2020-03-19 MED ORDER — METHOCARBAMOL 1000 MG/10ML IJ SOLN: 500.0000 mg | Freq: Four times a day (QID) | INTRAVENOUS | Status: DC | PRN

## 2020-03-19 MED ORDER — AMLODIPINE BESYLATE 5 MG PO TABS
5.0000 mg | ORAL_TABLET | Freq: Every day | ORAL | Status: DC
  Administered 2020-03-20 – 2020-03-22 (×3): 5 mg via ORAL
  Filled 2020-03-19 (×3): qty 1

## 2020-03-19 MED ORDER — MIDAZOLAM HCL 2 MG/2ML IJ SOLN
INTRAMUSCULAR | Status: AC
  Filled 2020-03-19: qty 2

## 2020-03-19 MED ORDER — LIDOCAINE-EPINEPHRINE 1 %-1:100000 IJ SOLN
INTRAMUSCULAR | Status: DC | PRN
  Administered 2020-03-19: 20 mL

## 2020-03-19 MED ORDER — THROMBIN 5000 UNITS EX SOLR
CUTANEOUS | Status: AC
  Filled 2020-03-19: qty 5000

## 2020-03-19 MED ORDER — LIDOCAINE-EPINEPHRINE 1 %-1:100000 IJ SOLN
INTRAMUSCULAR | Status: AC
  Filled 2020-03-19: qty 1

## 2020-03-19 MED ORDER — EPHEDRINE SULFATE-NACL 50-0.9 MG/10ML-% IV SOSY
PREFILLED_SYRINGE | INTRAVENOUS | Status: DC | PRN
  Administered 2020-03-19: 5 mg via INTRAVENOUS
  Administered 2020-03-19: 15 mg via INTRAVENOUS

## 2020-03-19 MED ORDER — ORAL CARE MOUTH RINSE: 15.0000 mL | Freq: Once | OROMUCOSAL | Status: AC

## 2020-03-19 MED ORDER — CEFAZOLIN SODIUM 1 G IJ SOLR
INTRAMUSCULAR | Status: AC
  Filled 2020-03-19: qty 20

## 2020-03-19 MED ORDER — SUCCINYLCHOLINE CHLORIDE 200 MG/10ML IV SOSY
PREFILLED_SYRINGE | INTRAVENOUS | Status: DC | PRN
  Administered 2020-03-19: 120 mg via INTRAVENOUS

## 2020-03-19 MED ORDER — SODIUM CHLORIDE 0.9% FLUSH: 3.0000 mL | INTRAVENOUS | Status: DC | PRN

## 2020-03-19 MED ORDER — CEFAZOLIN SODIUM-DEXTROSE 2-4 GM/100ML-% IV SOLN
2.0000 g | Freq: Three times a day (TID) | INTRAVENOUS | Status: AC
  Administered 2020-03-20 (×2): 2 g via INTRAVENOUS
  Filled 2020-03-19 (×2): qty 100

## 2020-03-19 MED ORDER — FENTANYL CITRATE (PF) 100 MCG/2ML IJ SOLN
25.0000 ug | INTRAMUSCULAR | Status: DC | PRN
  Administered 2020-03-19: 25 ug via INTRAVENOUS

## 2020-03-19 MED ORDER — EPHEDRINE 5 MG/ML INJ
INTRAVENOUS | Status: AC
  Filled 2020-03-19: qty 10

## 2020-03-19 MED ORDER — HEPARIN SODIUM (PORCINE) 5000 UNIT/ML IJ SOLN
5000.0000 [IU] | Freq: Two times a day (BID) | INTRAMUSCULAR | Status: DC
  Administered 2020-03-20 – 2020-03-21 (×2): 5000 [IU] via SUBCUTANEOUS
  Filled 2020-03-19 (×2): qty 1

## 2020-03-19 MED ORDER — HYDROMORPHONE HCL 1 MG/ML IJ SOLN
1.0000 mg | INTRAMUSCULAR | Status: DC | PRN
  Administered 2020-03-20 (×2): 1 mg via INTRAVENOUS
  Filled 2020-03-19 (×2): qty 1

## 2020-03-19 MED ORDER — LISINOPRIL 40 MG PO TABS
40.0000 mg | ORAL_TABLET | Freq: Every day | ORAL | Status: DC
  Administered 2020-03-20 – 2020-03-22 (×3): 40 mg via ORAL
  Filled 2020-03-19 (×2): qty 2
  Filled 2020-03-19: qty 1
  Filled 2020-03-19: qty 2
  Filled 2020-03-19 (×2): qty 1

## 2020-03-19 MED ORDER — FLUTICASONE PROPIONATE 50 MCG/ACT NA SUSP: 2.0000 | Freq: Every day | NASAL | Status: DC | PRN

## 2020-03-19 MED ORDER — ACETAMINOPHEN 10 MG/ML IV SOLN
INTRAVENOUS | Status: DC | PRN
  Administered 2020-03-19: 1000 mg via INTRAVENOUS

## 2020-03-19 SURGICAL SUPPLY — 72 items
BAG BANDED W/RUBBER/TAPE 36X54 (MISCELLANEOUS) ×4 IMPLANT
BAND RUBBER #18 3X1/16 STRL (MISCELLANEOUS) ×4 IMPLANT
BASKET BONE COLLECTION (BASKET) ×2 IMPLANT
BONE CANC CHIPS 20CC PCAN1/4 (Bone Implant) ×2 IMPLANT
BUR 2.5 MTCH HD 16 (BUR) ×2 IMPLANT
CARTRIDGE OIL MAESTRO DRILL (MISCELLANEOUS) ×1 IMPLANT
CASCADIA AN CONVEX 8.5X28X11 (Cage) ×2 IMPLANT
CHIPS CANC BONE 20CC PCAN1/4 (Bone Implant) ×1 IMPLANT
CNTNR URN SCR LID CUP LEK RST (MISCELLANEOUS) ×2 IMPLANT
CONT SPEC 4OZ STRL OR WHT (MISCELLANEOUS) ×2
COVER BACK TABLE 60X90IN (DRAPES) ×2 IMPLANT
COVER MAYO STAND STRL (DRAPES) ×2 IMPLANT
COVER WAND RF STERILE (DRAPES) ×2 IMPLANT
DECANTER SPIKE VIAL GLASS SM (MISCELLANEOUS) ×2 IMPLANT
DERMABOND ADVANCED (GAUZE/BANDAGES/DRESSINGS) ×1
DERMABOND ADVANCED .7 DNX12 (GAUZE/BANDAGES/DRESSINGS) ×1 IMPLANT
DIFFUSER DRILL AIR PNEUMATIC (MISCELLANEOUS) ×2 IMPLANT
DRAPE C-ARMOR (DRAPES) IMPLANT
DRAPE LAPAROTOMY 100X72X124 (DRAPES) ×2 IMPLANT
DRAPE MICROSCOPE LEICA (MISCELLANEOUS) ×2 IMPLANT
DRAPE STERI IOBAN 125X83 (DRAPES) ×2 IMPLANT
DRAPE SURG 17X23 STRL (DRAPES) ×2 IMPLANT
DRSG OPSITE POSTOP 3X4 (GAUZE/BANDAGES/DRESSINGS) ×4 IMPLANT
DURAPREP 26ML APPLICATOR (WOUND CARE) ×2 IMPLANT
ELECT BLADE INSULATED 6.5IN (ELECTROSURGICAL) ×2
ELECT REM PT RETURN 9FT ADLT (ELECTROSURGICAL) ×2
ELECTRODE BLDE INSULATED 6.5IN (ELECTROSURGICAL) ×1 IMPLANT
ELECTRODE REM PT RTRN 9FT ADLT (ELECTROSURGICAL) ×1 IMPLANT
GAUZE 4X4 16PLY RFD (DISPOSABLE) IMPLANT
GAUZE SPONGE 4X4 12PLY STRL (GAUZE/BANDAGES/DRESSINGS) ×2 IMPLANT
GLOVE BIOGEL PI IND STRL 8 (GLOVE) ×2 IMPLANT
GLOVE BIOGEL PI INDICATOR 8 (GLOVE) ×2
GLOVE ECLIPSE 8.0 STRL XLNG CF (GLOVE) ×4 IMPLANT
GOWN STRL REUS W/ TWL LRG LVL3 (GOWN DISPOSABLE) IMPLANT
GOWN STRL REUS W/ TWL XL LVL3 (GOWN DISPOSABLE) ×1 IMPLANT
GOWN STRL REUS W/TWL 2XL LVL3 (GOWN DISPOSABLE) IMPLANT
GOWN STRL REUS W/TWL LRG LVL3 (GOWN DISPOSABLE)
GOWN STRL REUS W/TWL XL LVL3 (GOWN DISPOSABLE) ×1
GUIDEWIRE EVEREST 1.4X620 2-PK (WIRE) ×6 IMPLANT
INTERBODY CSCD AN CVX8.5X28X11 (Cage) ×1 IMPLANT
KIT BASIN OR (CUSTOM PROCEDURE TRAY) ×2 IMPLANT
KIT POSITION SURG JACKSON T1 (MISCELLANEOUS) ×2 IMPLANT
KIT TURNOVER KIT B (KITS) ×2 IMPLANT
MARKER SKIN DUAL TIP RULER LAB (MISCELLANEOUS) ×2 IMPLANT
NEEDLE BIOPSY DD SERENGETI 8G (NEEDLE) ×4 IMPLANT
NEEDLE HYPO 25X1 1.5 SAFETY (NEEDLE) ×2 IMPLANT
NS IRRIG 1000ML POUR BTL (IV SOLUTION) ×2 IMPLANT
OIL CARTRIDGE MAESTRO DRILL (MISCELLANEOUS) ×2
PACK LAMINECTOMY NEURO (CUSTOM PROCEDURE TRAY) ×2 IMPLANT
PAD ARMBOARD 7.5X6 YLW CONV (MISCELLANEOUS) ×6 IMPLANT
PATTIES SURGICAL .5 X.5 (GAUZE/BANDAGES/DRESSINGS) IMPLANT
PATTIES SURGICAL .5 X1 (DISPOSABLE) IMPLANT
PATTIES SURGICAL 1X1 (DISPOSABLE) IMPLANT
PUTTY BONE 1CC ×2 IMPLANT
ROD CONT BN EVEREST 5.5X55 (Rod) ×2 IMPLANT
ROD CONT BN EVEREST 5.5X65 (Rod) ×2 IMPLANT
SCREW PA FENS EVEREST 6.5X35 (Screw) ×4 IMPLANT
SCREW PA FENS EVEREST 6.5X40 (Screw) ×4 IMPLANT
SCREW PA FENS EVEREST 6.5X45 (Screw) ×4 IMPLANT
SET SCREW (Screw) ×6 IMPLANT
SET SCREW VRST (Screw) ×6 IMPLANT
SPONGE LAP 4X18 RFD (DISPOSABLE) IMPLANT
SPONGE SURGIFOAM ABS GEL SZ50 (HEMOSTASIS) ×2 IMPLANT
STAPLER VISISTAT 35W (STAPLE) IMPLANT
SUT VIC AB 0 CT1 27 (SUTURE) ×1
SUT VIC AB 0 CT1 27XBRD ANBCTR (SUTURE) ×1 IMPLANT
SUT VIC AB 2-0 CP2 18 (SUTURE) ×4 IMPLANT
SUT VIC AB 3-0 SH 8-18 (SUTURE) IMPLANT
TOWEL GREEN STERILE (TOWEL DISPOSABLE) IMPLANT
TOWEL GREEN STERILE FF (TOWEL DISPOSABLE) IMPLANT
TRAY FOLEY MTR SLVR 16FR STAT (SET/KITS/TRAYS/PACK) ×2 IMPLANT
WATER STERILE IRR 1000ML POUR (IV SOLUTION) ×2 IMPLANT

## 2020-03-19 NOTE — Anesthesia Postprocedure Evaluation (Signed)
Anesthesia Post Note  Patient: Shannon [name]  Procedure(s) Performed: MINIMALLY INVASIVE TRANSFORAMINAL INTERBODY FUSION LUMBAR THREE- LUMBAR FOUR WITH REVISION OF HARDWARE LUMBAR FOUR - LUMBAR FIVE (N/A )     Patient location during evaluation: PACU Anesthesia Type: General Level of consciousness: awake Pain management: pain level controlled Vital Signs Assessment: post-procedure vital signs reviewed and stable Respiratory status: spontaneous breathing Cardiovascular status: stable Postop Assessment: no apparent nausea or vomiting Anesthetic complications: no   No complications documented.  Last Vitals:  Vitals:   03/19/20 1745 03/19/20 1800  BP: (!) 162/94 (!) 131/96  Pulse: (!) 116 (!) 114  Resp: 18 17  Temp: 36.9 C   SpO2: 95% 94%    Last Pain:  Vitals:   03/19/20 1806  TempSrc:   PainSc: 9     LLE Motor Response: Purposeful movement;Responds to commands (03/19/20 1800) LLE Sensation: Full sensation (03/19/20 1800) RLE Motor Response: Purposeful movement;Responds to commands (03/19/20 1800) RLE Sensation: Full sensation (03/19/20 1800)      Israel Wunder

## 2020-03-19 NOTE — Transfer of Care (Signed)
Immediate Anesthesia Transfer of Care Note  Patient: Shannon [name]  Procedure(s) Performed: MINIMALLY INVASIVE TRANSFORAMINAL INTERBODY FUSION LUMBAR THREE- LUMBAR FOUR WITH REVISION OF HARDWARE LUMBAR FOUR - LUMBAR FIVE (N/A )  Patient Location: PACU  Anesthesia Type:General  Level of Consciousness: awake and alert   Airway & Oxygen Therapy: Patient Spontanous Breathing and Patient connected to face mask oxygen  Post-op Assessment: Report given to RN and Post -op Vital signs reviewed and stable  Post vital signs: Reviewed and stable  Last Vitals:  Vitals Value Taken Time  BP 162/94 03/19/20 1745  Temp    Pulse 117 03/19/20 1744  Resp 16 03/19/20 1744  SpO2 96 % 03/19/20 1744  Vitals shown include unvalidated device data.  Last Pain:  Vitals:   03/19/20 1746  TempSrc:   PainSc: 0         Complications: No complications documented.

## 2020-03-19 NOTE — Anesthesia Procedure Notes (Signed)
Procedure Name: Intubation Date/Time: 03/19/2020 12:19 PM Performed by: Reece Agar, CRNA Pre-anesthesia Checklist: Patient identified, Emergency Drugs available, Suction available and Patient being monitored Patient Re-evaluated:Patient Re-evaluated prior to induction Oxygen Delivery Method: Circle System Utilized Preoxygenation: Pre-oxygenation with 100% oxygen Induction Type: IV induction Ventilation: Mask ventilation without difficulty Laryngoscope Size: Mac and 3 Grade View: Grade I Tube type: Oral Tube size: 7.0 mm Number of attempts: 1 Airway Equipment and Method: Stylet Placement Confirmation: ETT inserted through vocal cords under direct vision,  positive ETCO2 and breath sounds checked- equal and bilateral Secured at: 22 cm Tube secured with: Tape Dental Injury: Teeth and Oropharynx as per pre-operative assessment

## 2020-03-19 NOTE — Op Note (Signed)
hehistory  Providing Compassionate, Quality Care - Together  Date of service: 03/19/2020  PREOP DIAGNOSIS:  1. 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 Right lower extremity radiculopathy, L3  3. Lumbar spinal stenosis  POSTOP DIAGNOSIS: Same  PROCEDURE: 1. Minimally invasive L3-4 transforaminal lumbar interbody fusion, right sided approach  2. Posterior lumbar nonsegmental instrumentation from L3-4, K2M everest pedicle screw system  3. Placement of interbody anterior biomechanical device L3-4, K2M [place] interbody device  4. Use of operating microscope 5. Use of intraoperative fluoroscopy 6. Use of morselized allograft for fusion  7. Use of morselized autograft for fusion  8. Revision of bilateral L4, L5 pedicle screw instrumentation, removal of radius, placement of K2M everest pedicle screws  SURGEON: Dr. Pieter Partridge C. Damean Poffenberger, DO  ASSISTANT: Dr. Granville Lewis, MD  ANESTHESIA: General Endotracheal  EBL: 300 cc  SPECIMENS: None  DRAINS: None  COMPLICATIONS: None  CONDITION: Hemodynamically stable  HISTORY: Shannon [name] is a 64 y.o. female that previously had an L4-5 fusion in 2012 and presented to my office with complaints of worsening low back pain, right lower extremity pain that was intractable to conservative management.  Imaging work up revealed an L3-4 mobile spondylolisthesis with large gapped facets and moderate to severe foraminal and central canal stenosis.  Her pain was intractable to conservative measures and we discussed risks benefits and alternatives of surgical intervention.  Informed consent was obtained and agreed upon to proceed with L3-4 transforaminal lumbar interbody fusion, revision of L4-5 hardware.  PROCEDURE IN DETAIL: The patient was brought to the operating room. After induction of general anesthesia, the patient was positioned on the operative table in the prone position. All pressure points were meticulously padded.  The lumbar  region was sterilely prepped and draped in a normal fashion.  Using AP and lateral fluoroscopy, paramedian incisions were planned over the L3-5 pedicles bilaterally.  Using a 10 blade, a paramedian incision was made over the L3-5 pedicles bilaterally.  Using a Jamshidi the right L3 pedicle was cannulated under AP and lateral fluoroscopy and a K wire was placed.  Similar fashion was performed at L3 on the left.  Using the Metrix dilators, the L4 and L5 instrumentation was dilated over with the Metrix tube.  Setscrews were removed after using rongeurs to remove bony overgrowth, rods were removed and the pedicle screws were removed.  K wires were placed in the previous pedicle screw tracts.  AP and lateral fluoroscopy confirmed placement of K wires.  Using the Metrix dilators, the L3-4 facet was docked and dilated on the patient's right side.  Lateral fluoroscopy confirmed appropriate placement.  The microscope was sterilely draped and brought into the field.  Soft tissue was cleared of the L3 lamina and L3-4 facet using Bovie cautery.  Using the high-speed drill a complete bilateral laminectomy was performed and a complete right facetectomy was performed while harvesting the autograft with a bone trap.  The ligamentum flavum was encountered and carefully dissected with microcurette's free of the thecal sac.  The ligamentum flavum was resected bilaterally using Kerrison rongeurs.  The exiting and traversing nerve root were identified.  Epidural hemostasis was achieved with bipolar cautery.  The traversing nerve root and thecal sac was gently retracted medially and the annulus was identified and coagulated with bipolar cautery.  An annulotomy was performed with an 11 blade.  Using a series of curettes, rongeurs and disc space shavers a radical discectomy was performed.  The annulotomy was widened with a Kerrison rongeur.  The  endplates were prepared with curettes.  An appropriate size trial was placed in the  interbody space and lateral fluoroscopy confirmed appropriate sizing.  An 11 mm titanium cage was selected and this was filled with a mixture of autograft and allograft.  The autograft and allograft mixture was placed into the interbody space with a funnel.  This was packed anterior laterally in the disc space with a bone tamp.  The interbody spacer was then placed under lateral fluoroscopy with gentle medial retraction of the thecal sac.  Lateral fluoroscopy confirmed appropriate placement of the interbody device at L3-4.  The remainder of the autograft and allograft was packed laterally in the disc space with a bone tamp.  Using a Murphy ball probe, the thecal sac, traversing nerve root and exiting nerve root were all explored and felt to be decompressed.  Epidural hemostasis was achieved with bipolar cautery and Surgifoam.  A small piece of Gelfoam was placed over the thecal sac.  The Metrix tube was removed from the wound and the wound was hemostased with bipolar cautery.  At this point time, the microscope was removed from the field.  The pedicle screws were then placed over the K wires and L3 bilaterally (6.5 x 45 mm), L4 bilaterally (6.5 mm x 40 mm) and L5 bilaterally (6.5 mm x 35 mm) and the K wires were removed.  Lateral and AP fluoroscopy confirmed appropriate placement.  Calipers were used to measure the appropriate sized rods.  Rods were passed through the reduction towers.  Lateral fluoroscopy confirmed appropriate length rods.  Setscrews were placed and final tightened to the manufacturer's recommendations.  Again lateral fluoroscopy confirmed appropriate length rods.  The reduction towers were removed from the pedicle screws.  AP and lateral fluoroscopy final images confirmed appropriate hardware placement.  Hemostasis was achieved with bipolar cautery of the wounds.  The wound was then closed in layers using 2-0 Vicryl sutures and Dermabond for the skin.  Sterile dressing was applied.  At the end  of the case all sponge, needle, and instrument counts were correct. The patient was then transferred to the stretcher, extubated, and taken to the post-anesthesia care unit in stable hemodynamic condition.

## 2020-03-19 NOTE — H&P (Signed)
Providing Compassionate, Quality Care - Together  NEUROSURGERY HISTORY & PHYSICAL   Shannon [name] is an 64 y.o. female.   Chief Complaint: Low back pain and right lower extremity radiculopathy HPI: This is a pleasant 64 year old female with a history of an L4-5 PLIF in 2012 that returns with complaints of low back pain and right lower extremity radiculopathy.  Imaging revealed spondylolisthesis at L3-4 above her previous fusion.  He was noted to be significantly mobile on flexion-extension films.  MRI revealed severe stenosis and she unfortunately failed conservative measures such as physical therapy and injections for her pain control.  We discussed surgical intervention and alternative treatments and she agreed to proceed with surgical intervention.  She presents today for surgery.  He has now significant changes in her complaints.  Past Medical History:  Diagnosis Date  . Arthritis   . Cataract 12/2017   Dr. Schuyler Amor  . Dizziness    occasionally  . Headache   . Hyperlipidemia    takes Tricor daily  . Hypertension    takes Lisinopril and Metoprolol daily  . Hypertensive retinopathy 12/2017   Dr. Schuyler Amor  . Numbness    stinging in right hand  . Peripheral edema    takes lasix daily  . Pneumonia   . Ventral hernia     Past Surgical History:  Procedure Laterality Date  . ABDOMINAL HYSTERECTOMY    . BACK SURGERY  09/01/2010   L4-5 lumbar decompression including laminotomy, facetectomy,   . colonscopy    . FEMUR IM NAIL Left 09/05/2016  . FEMUR IM NAIL Left 09/05/2016   Procedure: INTRAMEDULLARY (IM) NAIL FEMORAL;  Surgeon: Nicholes Stairs, MD;  Location: [facility];  Service: Orthopedics;  Laterality: Left;  . FRACTURE SURGERY     left leg  . INSERTION OF MESH N/A 05/24/2013   Procedure: INSERTION OF MESH;  Surgeon: Imogene Burn. Georgette Dover, MD;  Location: [facility];  Service: General;  Laterality: N/A;  . UTERINE FIBROID SURGERY    . VENTRAL HERNIA REPAIR N/A 05/24/2013    Procedure: OPEN REPAIR EPIGASTRIC VENTRAL HERNIA;  Surgeon: Imogene Burn. Georgette Dover, MD;  Location: [facility];  Service: General;  Laterality: N/A;    Family History  Problem Relation Age of Onset  . Stroke Mother   . Breast cancer Sister    Social History:  reports that she has quit smoking. She has never used smokeless tobacco. She reports current alcohol use of about 7.0 standard drinks of alcohol per week. She reports that she does not use drugs.  Allergies:  Allergies  Allergen Reactions  . Morphine And Related Itching    Medications Prior to Admission  Medication Sig Dispense Refill  . amLODipine (NORVASC) 5 MG tablet Take 1 tablet (5 mg total) by mouth daily. 30 tablet 5  . gabapentin (NEURONTIN) 300 MG capsule 1 PO q HS, may increase to 1 PO TID if needed/tolerated (Patient taking differently: Take 300 mg by mouth 3 (three) times daily. ) 90 capsule 3  . hydrochlorothiazide (HYDRODIURIL) 12.5 MG tablet TAKE 1 TABLET(12.5 MG) BY MOUTH DAILY (Patient taking differently: Take 12.5 mg by mouth daily. ) 90 tablet 1  . lisinopril (ZESTRIL) 40 MG tablet TAKE 1 TABLET(40 MG) BY MOUTH DAILY (Patient taking differently: Take 40 mg by mouth daily. ) 90 tablet 1  . meloxicam (MOBIC) 15 MG tablet Take 1 tablet (15 mg total) by mouth daily. 30 tablet 3  . metoprolol succinate (TOPROL-XL) 100 MG 24 hr tablet TAKE  1 TABLET BY MOUTH DAILY WITH OR IMMEDIATELY FOLLOWING A MEAL (Patient taking differently: Take 100 mg by mouth daily. ) 90 tablet 1  . pregabalin (LYRICA) 50 MG capsule Take 50 mg by mouth 3 (three) times daily.    . traMADol (ULTRAM) 50 MG tablet Take 1 tablet (50 mg total) by mouth every 8 (eight) hours as needed. (Patient taking differently: Take 50 mg by mouth every 8 (eight) hours as needed for moderate pain. ) 90 tablet 0  . fluticasone (FLONASE) 50 MCG/ACT nasal spray Place 2 sprays into both nostrils daily. (Patient taking differently: Place 2 sprays into both nostrils daily as needed for  allergies. ) 16 g 0    Results for orders placed or performed during the hospital encounter of 03/18/20 (from the past 48 hour(s))  SARS CORONAVIRUS 2 (TAT 6-24 HRS) Nasopharyngeal Nasopharyngeal Swab     Status: None   Collection Time: 03/18/20  9:18 AM   Specimen: Nasopharyngeal Swab  Result Value Ref Range   SARS Coronavirus 2 NEGATIVE NEGATIVE    Comment: (NOTE) SARS-CoV-2 target nucleic acids are NOT DETECTED.  The SARS-CoV-2 RNA is generally detectable in upper and lower respiratory specimens during the acute phase of infection. Negative results do not preclude SARS-CoV-2 infection, do not rule out co-infections with other pathogens, and should not be used as the sole basis for treatment or other patient management decisions. Negative results must be combined with clinical observations, patient history, and epidemiological information. The expected result is Negative.  Fact Sheet for Patients: SugarRoll.be  Fact Sheet for Healthcare Providers: https://www.woods-mathews.com/  This test is not yet approved or cleared by the [place] FDA and  has been authorized for detection and/or diagnosis of SARS-CoV-2 by FDA under an Emergency Use Authorization (EUA). This EUA will remain  in effect (meaning this test can be used) for the duration of the COVID-19 declaration under Se ction 564(b)(1) of the Act, 21 U.S.C. section 360bbb-3(b)(1), unless the authorization is terminated or revoked sooner.  Performed at [facility], [place] [address] [place], [place] [address]    CT LUMBAR SPINE WO CONTRAST  Result Date: 03/19/2020 CLINICAL DATA:  Lumbosacral osteoarthritis, preop planning. EXAM: CT LUMBAR SPINE WITHOUT CONTRAST TECHNIQUE: Multidetector CT imaging of the lumbar spine was performed without intravenous contrast administration. Multiplanar CT image reconstructions were also generated. COMPARISON:  Lumbar MRI 02/06/2020  FINDINGS: Segmentation: 5 lumbar type vertebrae Alignment: Normal. Vertebrae: L4-5 PLIF with solid arthrodesis. No evidence of fracture or bone lesion. Paraspinal and other soft tissues: No visible mass or inflammation. Aortic atherosclerosis. Disc levels: T12- L1: Unremarkable. L1-L2: Unremarkable. L2-L3: Mild degenerative facet spurring.  Negative disc space. L3-L4: Disc narrowing with bulging. Facet osteoarthritis with bulky facet spurring and ligamentum flavum thickening. Advanced spinal and biforaminal stenosis. L4-L5: PLIF with solid arthrodesis.  No bony impingement. L5-S1:Facet osteoarthritis with spurring on the right more than left. Disc space narrowing and mild bulging without neural compression. IMPRESSION: 1. L3-4 advanced adjacent segment facet degeneration with high-grade spinal and foraminal stenosis. 2. L5-S1 moderate facet osteoarthritis asymmetric to the right. 3. L4-5 solid arthrodesis. Electronically Signed   By: Monte Fantasia M.D.   On: 03/19/2020 09:42    ROS 14 point review of systems was done, pertinent positives and negatives are listed in HPI above  Blood pressure (!) 148/93, pulse 82, temperature (!) 97.3 F (36.3 C), temperature source Temporal, resp. rate 18, height 5\' 2"  (1.575 m), weight 76.2 kg, SpO2 92 %. Physical Exam  AOx3, PERRLA EOMI Bilateral upper extremity 5/5 Cranial nerves II through XII intact Speech appropriate Bilateral lower extremity 4+ out of 5, pain limited  Assessment/Plan 64 year old female with  1.  L3-4 spondylolisthesis, adjacent segment disease with stenosis and right lower extremity radiculopathy   -OR today for extension TLIF L3-4, revision of hardware L4-5.  She received medical clearance from her primary doctor.  All risks, benefits and alternatives were discussed and agreed upon.    Thank you for allowing me to participate in this patient's care.  Please do not hesitate to call with questions or concerns.   Elwin Sleight,  [facility] Cell: [phone]

## 2020-03-19 NOTE — Progress Notes (Signed)
   Providing Compassionate, Quality Care - Together  NEUROSURGERY PROGRESS NOTE   S: pt s/e in pacu  O: EXAM:  BP (!) 162/94 (BP Location: Left Arm)   Pulse (!) 116   Temp 98.4 F (36.9 C)   Resp 18   Ht 5\' 2"  (1.575 m)   Wt 76.2 kg   SpO2 95%   BMI 30.73 kg/m   Awake, alert Communicating PERRL CNs grossly intact  5/5 BUE/BLE  Incision c/d/i SILT  ASSESSMENT:  64 y.o. female with   1. 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 of hardware L4-5 on 03/19/2020  PLAN: - pt/ot - pain control - neuro checks - no brace needed   Thank you for allowing me to participate in this patient's care.  Please do not hesitate to call with questions or concerns.   Elwin Sleight, [facility] Cell: [phone]

## 2020-03-20 ENCOUNTER — Encounter: Payer: Self-pay | Admitting: Internal Medicine

## 2020-03-20 LAB — BASIC METABOLIC PANEL
Anion gap: 9 (ref 5–15)
BUN: 12 mg/dL (ref 8–23)
CO2: 24 mmol/L (ref 22–32)
Calcium: 8.7 mg/dL — ABNORMAL LOW (ref 8.9–10.3)
Chloride: 108 mmol/L (ref 98–111)
Creatinine, Ser: 0.84 mg/dL (ref 0.44–1.00)
GFR, Estimated: >60 mL/min (ref >60)
Glucose, Bld: 88 mg/dL (ref 70–99)
Potassium: 4 mmol/L (ref 3.5–5.1)
Sodium: 141 mmol/L (ref 135–145)

## 2020-03-20 LAB — CBC
HCT: 33.3 % — ABNORMAL LOW (ref 36.0–46.0)
Hemoglobin: 10.4 g/dL — ABNORMAL LOW (ref 12.0–15.0)
MCH: 23.1 pg — ABNORMAL LOW (ref 26.0–34.0)
MCHC: 31.2 g/dL (ref 30.0–36.0)
MCV: 74 fL — ABNORMAL LOW (ref 80.0–100.0)
Platelets: 305 10*3/uL (ref 150–400)
RBC: 4.5 MIL/uL (ref 3.87–5.11)
RDW: 14.4 % (ref 11.5–15.5)
WBC: 10.4 10*3/uL (ref 4.0–10.5)
nRBC: 0 % (ref 0.0–0.2)

## 2020-03-20 MED ORDER — HYDROMORPHONE HCL 1 MG/ML IJ SOLN: 0.5000 mg | INTRAMUSCULAR | Status: DC | PRN

## 2020-03-20 MED ORDER — HYDROCODONE-ACETAMINOPHEN 10-325 MG PO TABS
1.0000 | ORAL_TABLET | ORAL | Status: DC | PRN
  Administered 2020-03-20 (×2): 2 via ORAL
  Administered 2020-03-22: 1 via ORAL
  Administered 2020-03-22: 2 via ORAL
  Filled 2020-03-20: qty 2
  Filled 2020-03-20: qty 1
  Filled 2020-03-20 (×2): qty 2

## 2020-03-20 NOTE — Progress Notes (Signed)
Pt arrived to 4NP-12 via bed. Pt A+Ox4. VS stable. Drainage on honeycomb dressings marked. Belongings at bedside. Unable to start sacral foam protocol d/t surgery. Belongings at bedside. Pt oriented to unit. IS at bedside, pt educated. Bed in lowest position, bed alarm on, and call bell in reach.

## 2020-03-20 NOTE — Progress Notes (Signed)
   Providing Compassionate, Quality Care - Together  NEUROSURGERY PROGRESS NOTE   S: No issues overnight. Saturated dressing this am  O: EXAM:  BP 129/82 (BP Location: Left Arm)   Pulse 84   Temp 99.1 F (37.3 C) (Oral)   Resp 19   Ht 5\' 2"  (1.575 m)   Wt 76.2 kg   SpO2 100%   BMI 30.73 kg/m   Awake, alert, oriented  Speech fluent, appropriate  CNs grossly intact  Full strength BUE/BLE  Dressing on right has some blood stain, changed this am due to significant saturation SILT  ASSESSMENT:  64 y.o. female with   1. 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 of hardware L4-5 on 03/19/2020  PLAN: - pt/ot - poss HHC - pain control - neuro checks - no brace needed - dressing changed, will monitor today - pos dc home tomorrow pending progression - am labs stable    Thank you for allowing me to participate in this patient's care.  Please do not hesitate to call with questions or concerns.   Elwin Sleight, [facility] Cell: [phone]

## 2020-03-20 NOTE — Progress Notes (Signed)
I received colonoscopy report from [facility].  Colonoscopy done 10/05/2019 by Dr. Gerri Spore.  This revealed mild diverticulosis throughout the entire colon.  Single polyps found in the sigmoid colon.  Medium size internal hemorrhoids.  Pathology from polyps revealed hyperplastic polyp.

## 2020-03-20 NOTE — Progress Notes (Signed)
OT Cancellation Note  Patient Details Name: Shannon [name] MRN: [id number] DOB: Nov 12, 1955   Cancelled Treatment:    Reason Eval/Treat Not Completed:  (Pt eating lunch).  Will reattempt.  [name]., OTR/L Acute Rehabilitation Services Pager [phone] Office [phone]   [name] 03/20/2020, 2:25 PM

## 2020-03-20 NOTE — Evaluation (Signed)
Physical Therapy Evaluation Patient Details Name: Shannon Shannon [name] MRN: [id number] DOB: October 10, 1955 Today's Date: 03/20/2020   History of Present Illness  64 year old female with a history of an L4-5 PLIF in 2012 that returns with complaints of low back pain and right lower extremity radiculopathy, imaging revealed spondylolisthesis at L3-4 above her previous fusion. Pt underwent extension TLIF L3-4, revision of hardware L4-5.    Clinical Impression  Patient is s/p above surgery resulting in the deficits listed below (see PT Problem List). Patient with significant 9/10 R LE pain limiting R LE weight bearing tolerance requiring increased assist for transfers and limiting ambulation tolerance. Patient will benefit from skilled PT to increase their independence and safety with mobility (while adhering to their precautions) to allow discharge to the venue listed below. Pt also received with saturated dressing, through gown and sheets. RN notified and changed dressing. MD notified as well. Anticipate once patient's pain is under control pt will progress well and will be able to go home with assist of family/friend and use of RW. Acute PT to cont to follow.     Follow Up Recommendations Home health PT;Supervision/Assistance - 24 hour    Equipment Recommendations  Rolling walker with 5" wheels;3in1 (PT)    Recommendations for Other Services       Precautions / Restrictions Precautions Precautions: Back;Fall Precaution Booklet Issued: Yes (comment) Precaution Comments: pt with verbal understanding Restrictions Weight Bearing Restrictions: No      Mobility  Bed Mobility Overal bed mobility: Needs Assistance Bed Mobility: Rolling;Sidelying to Sit Rolling: Mod assist Sidelying to sit: Mod assist       General bed mobility comments: walked in on RN tech assisting pt to EOB, increased time    Transfers Shannon [name] transfer level: Needs assistance Equipment used: Rolling walker (2  wheeled) Transfers: Sit to/from [facility] Sit to Stand: Mod assist;+2 physical assistance (modA from chair with arm rests) Stand pivot transfers: Mod assist       General transfer comment: pt with initial power up but required modAx2 to achieve full upright posture and steady/support during transition of hands, pt with crouched position  Ambulation/Gait Ambulation/Gait assistance: Mod assist Gait Distance (Feet): 15 Feet Assistive device: Rolling walker (2 wheeled) Gait Pattern/deviations: Step-to pattern;Decreased stride length;Antalgic;Trunk flexed Gait velocity: slow Gait velocity interpretation: <1.8 ft/sec, indicate of risk for recurrent falls General Gait Details: pt with R LE pain limiting WBing tolerance, began with R LE buckling requiring maxA to prevent fall however then increased UE support and was able to amb to the door however painful and significant dependence on UEs unable to maintain full upright position  Stairs            Wheelchair Mobility    Modified Rankin (Stroke Patients Only)       Balance Shannon [name] balance assessment: Needs assistance Sitting-balance support: Feet supported;No upper extremity supported Sitting balance-Leahy Scale: Fair     Standing balance support: Bilateral upper extremity supported Standing balance-Leahy Scale: Poor Standing balance comment: dependent on RW                             Pertinent Vitals/Pain Pain Assessment: 0-10 Pain Score: 9  Pain Location: R LE Pain Descriptors / Indicators: Sharp Pain Intervention(s): Monitored during session;Patient requesting pain meds-RN notified    Home Living Family/patient expects to be discharged to:: Private residence Living Arrangements:  (friend and son) Available Help at Discharge: Family;Friend(s);Available 24 hours/day Type  of Home: Apartment Home Access: Level entry     Home Layout: One level Home Equipment: [place] - 4 wheels;Cane -  single point;Electric scooter;Grab bars - tub/shower      Prior Function Level of Independence: Independent         Comments: uses cane, friend goes to [employer] but he will also ride his electric scooter to the grocery store     Hand Dominance   Dominant Hand: Right    Extremity/Trunk Assessment   Upper Extremity Assessment Upper Extremity Assessment: Generalized weakness    Lower Extremity Assessment Lower Extremity Assessment: Generalized weakness (limited resistance due to pain onset d/t surgery)    Cervical / Trunk Assessment Cervical / Trunk Assessment: Other exceptions Cervical / Trunk Exceptions: recent back surgery  Communication   Communication: No difficulties  Cognition Arousal/Alertness: Awake/alert Behavior During Therapy: WFL for tasks assessed/performed Shannon [name] Cognitive Status: Within Functional Limits for tasks assessed                                        General Comments General comments (skin integrity, edema, etc.): pt assisted to [facility], pt indep with hygiene from sitting position    Exercises     Assessment/Plan    PT Assessment Patient needs continued PT services  PT Problem List Decreased strength;Decreased activity tolerance;Decreased balance;Decreased mobility;Decreased knowledge of use of DME;Pain       PT Treatment Interventions DME instruction;Gait training;Stair training;Functional mobility training;Therapeutic activities;Therapeutic exercise;Balance training;Neuromuscular re-education    PT Goals (Current goals can be found in the Care Plan section)  Acute Rehab PT Goals Patient Stated Goal: improve pan PT Goal Formulation: With patient Time For Goal Achievement: 04/03/20 Potential to Achieve Goals: Good    Frequency Min 5X/week   Barriers to discharge        Co-evaluation               AM-PAC PT "6 Clicks" Mobility  Outcome Measure Help needed turning from your back to your side while in a flat bed  without using bedrails?: A Lot Help needed moving from lying on your back to sitting on the side of a flat bed without using bedrails?: A Lot Help needed moving to and from a bed to a chair (including a wheelchair)?: A Lot Help needed standing up from a chair using your arms (e.g., wheelchair or bedside chair)?: A Lot Help needed to walk in hospital room?: A Lot Help needed climbing 3-5 steps with a railing? : A Lot 6 Click Score: 12    End of Session Equipment Utilized During Treatment: Gait belt Activity Tolerance: Patient limited by pain Patient left: in chair;with call bell/phone within reach Nurse Communication: Mobility status PT Visit Diagnosis: Unsteadiness on feet (R26.81);Difficulty in walking, not elsewhere classified (R26.2);Pain Pain - part of body:  (back)    Time: 3143-8887 PT Time Calculation (min) (ACUTE ONLY): 28 min   Charges:   PT Evaluation $PT Eval Moderate Complexity: 1 Mod PT Treatments $Gait Training: 8-22 mins        Kittie Plater, PT, DPT Acute Rehabilitation Services Pager #: [phone] Office #: [phone]   Berline Lopes 03/20/2020, 8:32 AM

## 2020-03-21 ENCOUNTER — Encounter (HOSPITAL_COMMUNITY): Payer: Self-pay | Admitting: Neurological Surgery

## 2020-03-21 ENCOUNTER — Inpatient Hospital Stay (HOSPITAL_COMMUNITY): Payer: Medicare Other

## 2020-03-21 MED ORDER — HEPARIN SODIUM (PORCINE) 5000 UNIT/ML IJ SOLN
5000.0000 [IU] | Freq: Three times a day (TID) | INTRAMUSCULAR | Status: DC
  Administered 2020-03-21 – 2020-03-22 (×3): 5000 [IU] via SUBCUTANEOUS
  Filled 2020-03-21 (×3): qty 1

## 2020-03-21 MED ORDER — SENNOSIDES-DOCUSATE SODIUM 8.6-50 MG PO TABS
1.0000 | ORAL_TABLET | Freq: Once | ORAL | Status: AC
  Administered 2020-03-21: 1 via ORAL
  Filled 2020-03-21: qty 1

## 2020-03-21 NOTE — Progress Notes (Addendum)
   Providing Compassionate, Quality Care - Together  NEUROSURGERY PROGRESS NOTE   S: No issues overnight. Complains of cough this am, states RLE numbness/tingling is improved from preop  O: EXAM:  BP 118/72 (BP Location: Left Arm)   Pulse 91   Temp (!) 100.7 F (38.2 C) (Oral)   Resp 20   Ht 5\' 2"  (1.575 m)   Wt 76.2 kg   SpO2 94%   BMI 30.73 kg/m   Awake, alert, oriented  Speech fluent, appropriate  CNs grossly intact  PERRL Incision c/d/i, some peri incisional ecchymoses 5/5 BUE/BLE   ASSESSMENT:  64 y.o. female with  1. 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 of hardware L4-5 on 03/19/2020  PLAN: -pt/ot - poss HHC, ? Today v tomorrow dc - pain control - neuro checks - dressing dc d - fever this am, CXR pending, monitor   Thank you for allowing me to participate in this patient's care.  Please do not hesitate to call with questions or concerns.   Elwin Sleight, [facility] Cell: [phone]

## 2020-03-21 NOTE — Progress Notes (Signed)
Physical Therapy Treatment Patient Details Name: Shannon [name] MRN: [id number] DOB: [birth year] Today's Date: 03/21/2020       PT Comments  The pt is making good progress with mobility and PT goals at this time, but continues to be limited by pain, and strength/power deficits to decrease need for assist with sit-stand transfers. The pt was able to complete 15 ft of ambulation in the room as well as x6 sit-stand transfers through the session, but continues to require modA to power up and maintain her stability with initial stand. The pt will continue to benefit from skilled PT to progress functional strength, endurance, and stability to allow for safe d/c when medically cleared.     03/21/20 1000  PT Visit Information  Last PT Received On 03/21/20  Assistance Needed +1  History of Present Illness 64 year old female with a history of an L4-5 PLIF in 2012 that returns with complaints of low back pain and right lower extremity radiculopathy, imaging revealed spondylolisthesis at L3-4 above her previous fusion. Pt underwent extension TLIF L3-4, revision of hardware L4-5.  Subjective Data  Subjective pt reports increase in pain this morning  Patient Stated Goal improve pain  Precautions  Precautions Back;Fall  Precaution Booklet Issued Yes (comment)  Precaution Comments pt with verbal understanding, benefits from VC for log roll  Restrictions  Weight Bearing Restrictions No  Pain Assessment  Pain Assessment Faces  Faces Pain Scale 6  Pain Location back, with sit-stand  Pain Descriptors / Indicators Sharp  Pain Intervention(s) Limited activity within patient's tolerance;Monitored during session;Repositioned  Cognition  Arousal/Alertness Awake/alert  Behavior During Therapy WFL for tasks assessed/performed  Overall Cognitive Status Within Functional Limits for tasks assessed  Bed Mobility  Overal bed mobility Needs Assistance  Bed Mobility Rolling;Sit to Sidelying  Rolling Min  guard  Sit to sidelying Mod assist  General bed mobility comments walked in on RN tech assisting pt on BSC, pt required modA to bring BLE into bed, but was able to roll with VC only  Transfers  Overall transfer level Needs assistance  Equipment used Rolling walker (2 wheeled)  Transfers Sit to/from [employer] Transfers  Sit to Stand Mod assist  Stand pivot transfers Mod assist  General transfer comment pt able to complete initial power up but requires significant assist to steady and maintain stand while reaching for UE support on RW. Pt only able to achieve crouched position without assist. then completed x5 from EOB, continues to need sig assist  Ambulation/Gait  Ambulation/Gait assistance Min assist  Gait Distance (Feet) 15 Feet  Assistive device Rolling walker (2 wheeled)  Gait Pattern/deviations Step-through pattern;Decreased stride length;Antalgic;Narrow base of support;Trunk flexed  General Gait Details pt able to progress BLE well with no instances of buckling today. Pt remains with trunk flexed and significant reliance on UE support. cues for increased distance from front of RW with gait which pt maintained well  Gait velocity slow  Gait velocity interpretation <1.31 ft/sec, indicative of household ambulator  Balance  Overall balance assessment Needs assistance  Sitting-balance support Feet supported;No upper extremity supported  Sitting balance-Leahy Scale Fair  Standing balance support Bilateral upper extremity supported  Standing balance-Leahy Scale Poor  Standing balance comment dependent on RW  General Comments  General comments (skin integrity, edema, etc.) HR to 120  Exercises  Exercises Other exercises  Other Exercises  Other Exercises x5 sit-stand from EOB with focus on powering up with less assist from PT to maintain balance  PT - End  of Session  Equipment Utilized During Treatment Gait belt  Activity Tolerance Patient limited by pain  Patient left in  bed;with call bell/phone within reach;with bed alarm set  Nurse Communication Mobility status  PT - Assessment/Plan  PT Plan Current plan remains appropriate  PT Visit Diagnosis Unsteadiness on feet (R26.81);Difficulty in walking, not elsewhere classified (R26.2);Pain  Pain - part of body (back)  PT Frequency (ACUTE ONLY) Min 5X/week  Follow Up Recommendations Home health PT;Supervision/Assistance - 24 hour  PT equipment Rolling walker with 5" wheels;3in1 (PT)  AM-PAC PT "6 Clicks" Mobility Outcome Measure (Version 2)  Help needed turning from your back to your side while in a flat bed without using bedrails? 3  Help needed moving from lying on your back to sitting on the side of a flat bed without using bedrails? 3  Help needed moving to and from a bed to a chair (including a wheelchair)? 3  Help needed standing up from a chair using your arms (e.g., wheelchair or bedside chair)? 2  Help needed to walk in hospital room? 3  Help needed climbing 3-5 steps with a railing?  2  6 Click Score 16  Consider Recommendation of Discharge To: Home with [facility]  PT Goal Progression  Progress towards PT goals Progressing toward goals  Acute Rehab PT Goals  PT Goal Formulation With patient  Time For Goal Achievement 04/03/20  Potential to Achieve Goals Good  PT Time Calculation  PT Start Time (ACUTE ONLY) 4696  PT Stop Time (ACUTE ONLY) 0939  PT Time Calculation (min) (ACUTE ONLY) 21 min  PT General Charges  $$ ACUTE PT VISIT 1 Visit  PT Treatments  $Gait Training 8-22 mins       Hardie Pulley, DPT   Acute Rehabilitation Department Pager #: [phone]   Otho Bellows 03/21/2020, 12:33 PM

## 2020-03-21 NOTE — TOC Initial Note (Signed)
Transition of Care (TOC) - Initial/Assessment Note  Marvetta Gibbons RN,BSN Transitions of Care Unit 4NP (non trauma) - RN Case Manager See Treatment Team for direct Phone #   Patient Details  Name: Shannon [name] MRN: [id number] Date of Birth: 1955-04-18  Transition of [facility]) CM/SW Contact:    Dawayne Patricia, RN Phone Number: 03/21/2020, 4:39 PM  Clinical Narrative:                 Pt s/p hardware revision from home, recs for HHPT- will need HH orders placed for discharge. CM spoke with pt at bedside- per pt she has all needed DME- walker, BSC, shower chair, Hoveround Discussed HH recommendations- [facility]- list provided to pt for [facility] cPer CMS guidelines from medicare.gov website with star ratings (copy placed in shadow chart)- per pt she would like to use [facility] for services- after that she does not have a preference- Will call [facility] after [place] placed for referral of services.   TOC to follow  Expected Discharge Plan: [place] Barriers to Discharge: Continued Medical Work up   Patient Goals and CMS Choice Patient states their goals for this hospitalization and ongoing recovery are:: return home CMS Medicare.gov Compare Post Acute Care list provided to:: Patient Choice offered to / list presented to : Patient  Expected Discharge Plan and Services Expected Discharge Plan: [place]   Discharge Planning Services: CM Consult Post Acute Care Choice: Home Health                   DME Arranged: N/A DME Agency: NA       HH Arranged: PT HH Agency: [employer] rights [employer])        Prior Living Arrangements/Services   Lives with:: Self Patient language and need for interpreter reviewed:: Yes Do you feel safe going back to the place where you live?: Yes      Need for Family Participation in Patient Care: Yes (Comment) Care giver support system in place?: Yes (comment) Current home services: DME Criminal  Activity/Legal Involvement Pertinent to Current Situation/Hospitalization: No - Comment as needed  Activities of Daily Living      Permission Sought/Granted Permission sought to share information with : Chartered certified accountant granted to share information with : Yes, Verbal Permission Granted     Permission granted to share info w AGENCY: [facility]        Emotional Assessment Appearance:: Appears stated age Attitude/Demeanor/Rapport: Engaged Affect (typically observed): Appropriate,Pleasant Orientation: : Oriented to Self,Oriented to Place,Oriented to  Time,Oriented to Situation Alcohol / Substance Use: Not Applicable Psych Involvement: No (comment)  Admission diagnosis:  Lumbar adjacent segment disease with spondylolisthesis [M51.36, M43.16] Patient Active Problem List   Diagnosis Date Noted  . Lumbar adjacent segment disease with spondylolisthesis 03/19/2020  . Recurrent falls 02/22/2020  . Prediabetes 03/24/2019  . Vitamin D deficiency 03/24/2019  . Influenza vaccine needed 12/23/2018  . Renal insufficiency 08/22/2018  . Obesity (BMI 30-39.9) 01/15/2017  . Alcohol use disorder, mild, abuse 01/15/2017  . Closed fracture of left distal femur ([place]) 09/03/2016  . Osteoarthritis of left knee 05/02/2015  . Postlaminectomy syndrome, lumbar region 11/09/2013  . Lumbosacral spondylosis without myelopathy 11/09/2013  . Dyslipidemia 10/11/2013  . Ventral hernia 05/19/2013  . DEPRESSION 06/13/2010  . DIVERTICULOSIS, COLON 07/21/2009  . SYPHILIS 05/14/2009  . DYSHIDROTIC ECZEMA, HANDS 11/06/2008  . Chronic low back pain 11/06/2008  . FIBROIDS, UTERUS 11/17/2007  .  Essential hypertension 11/17/2007   PCP:  Ladell Pier, MD Pharmacy:   [facility] [place], [place] AT [address] [address] Phone: [phone] Fax: [phone]     Social Determinants of Health (SDOH)  Interventions    Readmission Risk Interventions No flowsheet data found.

## 2020-03-21 NOTE — Evaluation (Signed)
Occupational Therapy Evaluation Patient Details Name: Shannon [name] MRN: [id number] DOB: 02/16/1956 Today's Date: 03/21/2020    History of Present Illness 64 year old female with a history of an L4-5 PLIF in 2012 that returns with complaints of low back pain and right lower extremity radiculopathy, imaging revealed spondylolisthesis at L3-4 above her previous fusion. Pt underwent extension TLIF L3-4, revision of hardware L4-5.   Clinical Impression   Pt admitted with above. She demonstrates the below listed deficits and will benefit from continued OT to maximize safety and independence with BADLs.  Pt presents to OT with generalized weakness, decreased balance, increased pain, decreased knowledge of precautions.  She currently requires set up/supervision for UB ADLs and mod - total A for LB ADLs. She requires min A for functional mobility. She reports she lives with her son and friend, and was mod I with ADLs PTA.  Recommend HHOT.  Will follow.       Follow Up Recommendations  Home health OT;Supervision/Assistance - 24 hour    Equipment Recommendations  3 in 1 bedside commode    Recommendations for Other Services       Precautions / Restrictions Precautions Precautions: Back;Fall Precaution Booklet Issued: Yes (comment) Precaution Comments: Pt able to recall 2/3 back precautions and requires mod cues to adhere to precautions during session      Mobility Bed Mobility Overal bed mobility: Needs Assistance Bed Mobility: Rolling;Sidelying to Sit;Sit to Sidelying Rolling: Min assist Sidelying to sit: Min assist     Sit to sidelying: Min guard General bed mobility comments: assist to guide movement for log rolling and moving LEs off bed    Transfers Overall transfer level: Needs assistance Equipment used: Rolling walker (2 wheeled) Transfers: Sit to/from [facility] Sit to Stand: Min assist Stand pivot transfers: Min assist       General transfer  comment: Pt requires assist to move into standing and cues to maintain precautions as she tends to lean forward    Balance Overall balance assessment: Needs assistance Sitting-balance support: Feet supported;No upper extremity supported Sitting balance-Leahy Scale: Fair Sitting balance - Comments: able to maintain static standing with min guard assist   Standing balance support: Bilateral upper extremity supported Standing balance-Leahy Scale: Poor Standing balance comment: dependent on RW                           ADL either performed or assessed with clinical judgement   ADL Overall ADL's : Needs assistance/impaired Eating/Feeding: Independent;Sitting;Bed level   Grooming: Wash/dry hands;Wash/dry face;Oral care;Brushing hair;Min guard;Standing   Upper Body Bathing: Set up;Sitting   Lower Body Bathing: Maximal assistance;Sit to/from stand Lower Body Bathing Details (indicate cue type and reason): unable to perform figure 4 Upper Body Dressing : Set up;Sitting   Lower Body Dressing: Total assistance;Sit to/from stand Lower Body Dressing Details (indicate cue type and reason): unable to perform figure 4 Toilet Transfer: Minimal assistance;Ambulation;Comfort height toilet;RW Armed forces technical officer Details (indicate cue type and reason): assist with sit to stand Toileting- Clothing Manipulation and Hygiene: Moderate assistance;Sit to/from stand       Functional mobility during ADLs: Minimal assistance;Rolling walker General ADL Comments: Pt reports she was able to perfrom figure 4 PTA for LB ADLs     Vision         Perception     Praxis      Pertinent Vitals/Pain Pain Assessment: 0-10 Faces Pain Scale: Hurts whole lot Pain Location: back Pain Descriptors /  Indicators: Operative site guarding;Sore;Sharp Pain Intervention(s): Monitored during session;Repositioned;Premedicated before session     Hand Dominance Right   Extremity/Trunk Assessment Upper Extremity  Assessment Upper Extremity Assessment: Generalized weakness   Lower Extremity Assessment Lower Extremity Assessment: Generalized weakness   Cervical / Trunk Assessment Cervical / Trunk Assessment: Other exceptions Cervical / Trunk Exceptions: recent back surgery   Communication Communication Communication: No difficulties   Cognition Arousal/Alertness: Awake/alert Behavior During Therapy: WFL for tasks assessed/performed Overall Cognitive Status: Within Functional Limits for tasks assessed                                     General Comments  VSS    Exercises     Shoulder Instructions      Home Living Family/patient expects to be discharged to:: Private residence Living Arrangements: Spouse/significant other;Children Available Help at Discharge: Family;Friend(s);Available 24 hours/day Type of Home: Apartment Home Access: Level entry     Home Layout: One level     Bathroom Shower/Tub: Teacher, early years/pre: Standard     Home Equipment: Environmental consultant - 4 wheels;Cane - single point;Electric scooter;Grab bars - tub/shower;Tub bench   Additional Comments: lives with friend, who goes to HD T,TH, Sat, and her son who doesn't work      Prior Functioning/Environment Level of Independence: Independent with assistive device(s)        Comments: Pt reports she ambulates with cane, and is able to perform ADLs mod I.   Requires assist for IADLs        OT Problem List: Decreased strength;Decreased activity tolerance;Impaired balance (sitting and/or standing);Decreased safety awareness;Decreased knowledge of use of DME or AE;Pain;Decreased knowledge of precautions      OT Treatment/Interventions: Self-care/ADL training;DME and/or AE instruction;Therapeutic activities;Patient/family education;Balance training    OT Goals(Current goals can be found in the care plan section) Acute Rehab OT Goals Patient Stated Goal: to be able to take care of self OT  Goal Formulation: With patient Time For Goal Achievement: 04/04/20 Potential to Achieve Goals: Good ADL Goals Pt Will Perform Grooming: with supervision;standing Pt Will Perform Upper Body Bathing: with supervision;with set-up;sitting Pt Will Perform Lower Body Bathing: with min guard assist;sit to/from stand;with adaptive equipment Pt Will Perform Upper Body Dressing: with set-up;with supervision;sitting Pt Will Perform Lower Body Dressing: with min guard assist;sit to/from stand;with adaptive equipment Pt Will Transfer to Toilet: with min guard assist;ambulating;bedside commode;grab bars;regular height toilet Pt Will Perform Toileting - Clothing Manipulation and hygiene: with min guard assist;sit to/from stand Pt Will Perform Tub/Shower Transfer: Tub transfer;with min guard assist;ambulating;tub bench;rolling walker  OT Frequency: Min 2X/week   Barriers to D/C:            Co-evaluation              AM-PAC OT "6 Clicks" Daily Activity     Outcome Measure Help from another person eating meals?: None Help from another person taking care of personal grooming?: A Little Help from another person toileting, which includes using toliet, bedpan, or urinal?: A Lot Help from another person bathing (including washing, rinsing, drying)?: A Lot Help from another person to put on and taking off regular upper body clothing?: A Little Help from another person to put on and taking off regular lower body clothing?: Total 6 Click Score: 15   End of Session Equipment Utilized During Treatment: Gait belt;Rolling walker Nurse Communication: Mobility status  Activity Tolerance:  Patient tolerated treatment well Patient left: in bed;with call bell/phone within reach;with bed alarm set  OT Visit Diagnosis: Pain Pain - part of body:  (back)                Time: 5732-2025 OT Time Calculation (min): 17 min Charges:  OT General Charges $OT Visit: 1 Visit OT Evaluation $OT Eval Moderate  Complexity: 1 Mod  [name]., OTR/L Acute Rehabilitation Services Pager [phone] Office [phone]   [name] 03/21/2020, 5:17 PM

## 2020-03-22 ENCOUNTER — Other Ambulatory Visit (HOSPITAL_COMMUNITY): Payer: Self-pay | Admitting: Neurological Surgery

## 2020-03-22 MED ORDER — MAGNESIUM HYDROXIDE 400 MG/5ML PO SUSP
15.0000 mL | Freq: Once | ORAL | Status: AC
  Administered 2020-03-22: 15 mL via ORAL
  Filled 2020-03-22: qty 30

## 2020-03-22 MED ORDER — HYDROCODONE-ACETAMINOPHEN 10-325 MG PO TABS
1.0000 | ORAL_TABLET | ORAL | 0 refills | Status: DC | PRN
Start: 2020-03-22 — End: 2020-03-22

## 2020-03-22 MED ORDER — METHOCARBAMOL 500 MG PO TABS: 500.0000 mg | ORAL_TABLET | Freq: Four times a day (QID) | ORAL | 2 refills | Status: DC | PRN

## 2020-03-22 MED FILL — METHOCARBAMOL 500 MG TABS: 500 | 3 days supply | Qty: 10 | Fill #0

## 2020-03-22 MED FILL — HYDROCODON-APAP 10-325: 10-325 | 5 days supply | Qty: 30 | Fill #0

## 2020-03-22 NOTE — Plan of Care (Signed)

## 2020-03-22 NOTE — Discharge Summary (Signed)
Physician Discharge Summary  Patient ID: Shannon [name] MRN: [id number] DOB/AGE: 1955-07-10 64 y.o.  Admit date: 03/19/2020 Discharge date: 03/22/2020  Admission Diagnoses:  1. 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 L3-4 Stenosis 3. L3 radiculopathy  Discharge Diagnoses:  Same Active Problems:   Lumbar adjacent segment disease with spondylolisthesis   Discharged Condition: Stable  Hospital Course:  Shannon [name] is a 64 y.o. female presented for an elective L3-4 extension transforaminal lumbar interbody fusion.  She tolerated the procedure well.  Postoperatively she was monitored closely and was evaluated by physical therapy and Occupational Therapy.  Her pain was controlled on oral medication upon discharge.  Physical therapy and Occupational Therapy recommended home with home health care.  This was ordered and set up.  Patient was at her neurologic baseline upon discharge.  Her right lower extremity preoperative symptoms were improved.  She was having normal bowel and bladder function upon discharge as well as tolerating a normal diet.  Treatments: Surgery -L3-4 TLIF, revision of hardware L4-5  Discharge Exam: Blood pressure 98/68, pulse 68, temperature 98.7 F (37.1 C), temperature source Oral, resp. rate 17, height 5\' 2"  (1.575 m), weight 76.2 kg, SpO2 100 %. Awake, alert, oriented Speech fluent, appropriate PERRLA CN grossly intact 5/5 BUE/BLE Wound c/d/i  Disposition: Discharge disposition: 06-Home-Health Care Svc       Discharge Instructions    Face-to-face encounter (required for Medicare/Medicaid patients)   Complete by: As directed    I Theodoro Doing Joylene Wescott certify that this patient is under my care and that I, or a nurse practitioner or physician's assistant working with me, had a face-to-face encounter that meets the physician face-to-face encounter requirements with this patient on 03/22/2020. The encounter with the patient was in whole, or in part for  the following medical condition(s) which is the primary reason for home health care (List medical condition): lumbar spondylosis   The encounter with the patient was in whole, or in part, for the following medical condition, which is the primary reason for home health care: lumbar spondylosis   I certify that, based on my findings, the following services are medically necessary home health services: Physical therapy   Reason for Medically Necessary Home Health Services: Therapy- Instruction on Safe use of Assistive Devices for ADLs   My clinical findings support the need for the above services: Pain interferes with ambulation/mobility   Further, I certify that my clinical findings support that this patient is homebound due to: Pain interferes with ambulation/mobility   Home Health   Complete by: As directed    To provide the following care/treatments:  PT OT     Incentive spirometry RT   Complete by: As directed      Allergies as of 03/22/2020      Reactions   Morphine And Related Itching      Medication List    STOP taking these medications   meloxicam 15 MG tablet Commonly known as: MOBIC   pregabalin 50 MG capsule Commonly known as: LYRICA   traMADol 50 MG tablet Commonly known as: ULTRAM     TAKE these medications   amLODipine 5 MG tablet Commonly known as: NORVASC Take 1 tablet (5 mg total) by mouth daily.   fluticasone 50 MCG/ACT nasal spray Commonly known as: FLONASE Place 2 sprays into both nostrils daily. What changed:   when to take this  reasons to take this   gabapentin 300 MG capsule Commonly known as: NEURONTIN 1 PO q HS, may increase to  1 PO TID if needed/tolerated What changed:   how much to take  how to take this  when to take this  additional instructions   hydrochlorothiazide 12.5 MG tablet Commonly known as: HYDRODIURIL TAKE 1 TABLET(12.5 MG) BY MOUTH DAILY What changed:   how much to take  how to take this  when to take  this  additional instructions   HYDROcodone-acetaminophen 10-325 MG tablet Commonly known as: NORCO Take 1-2 tablets by mouth every 4 (four) hours as needed for moderate pain ((score 4 to 6)).   lisinopril 40 MG tablet Commonly known as: ZESTRIL TAKE 1 TABLET(40 MG) BY MOUTH DAILY What changed:   how much to take  how to take this  when to take this  additional instructions   methocarbamol 500 MG tablet Commonly known as: ROBAXIN Take 1 tablet (500 mg total) by mouth every 6 (six) hours as needed for muscle spasms.   metoprolol succinate 100 MG 24 hr tablet Commonly known as: TOPROL-XL TAKE 1 TABLET BY MOUTH DAILY WITH OR IMMEDIATELY FOLLOWING A MEAL What changed:   how much to take  how to take this  when to take this  additional instructions       Follow-up Information    Kimiya Brunelle C, DO Follow up in 1 month(s).   Why: call for appointment Contact information: [address] [place] [address] [phone]               Signed: Theodoro Doing Neisha Hinger 03/22/2020, 11:08 AM

## 2020-03-22 NOTE — TOC Transition Note (Addendum)
Transition of Care (TOC) - CM/SW Discharge Note Marvetta Gibbons RN,BSN Transitions of Care Unit 4NP (non trauma) - RN Case Manager See Treatment Team for direct Phone #    Patient Details  Name: Shannon [name] MRN: [id number] Date of Birth: 01-Mar-1956  Transition of Care [facility]) CM/SW Contact:  Dawayne Patricia, RN Phone Number: 03/22/2020, 12:05 PM   Clinical Narrative:    Pt stable for transition home today, orders placed for HHPT/OT, as per conversation with pt yesterday pt choice for [facility] agency is [facility]- call made to [place] with [facility] for [facility] referral- referral has been accepted for [facility] needs. They will contact pt post discharge for start of care.  TOC to fill meds prior to discharge,  Contacted by bedside RN- regarding cost for Robaxin $9.21- pt states she is unable to afford- as pt has medication coverage- pt is not eligible for pt assistance.  Pt also states she will need transportation home, this can be set up with [facility] by unit [phone].   1330- pt ready for discharge- when she alerted staff that she needed a walker for home- apparently she had been borrowing one and her friend needs it back- staff notified this writer that pt would need a rollator for home- order placed and call made to [facility] for DME referral- rollator to be delivered to room prior to discharge- staff aware to wait until DME arrives before called for ride.    Final next level of care: [place] Barriers to Discharge: Barriers Resolved   Patient Goals and CMS Choice Patient states their goals for this hospitalization and ongoing recovery are:: return home CMS Medicare.gov Compare Post Acute Care list provided to:: Patient Choice offered to / list presented to : Patient  Discharge Placement               home with [facility]        Discharge Plan and Services   Discharge Planning Services: CM Consult Post Acute Care Choice: Home Health          DME Arranged: N/A--  rollator DME Agency: NA-- [facility] Called on 03/22/20 at 1330 spoke with [facility]       HH Arranged: PT [place] Agency: [place] ([place]) Date [place]: 03/22/20 Time [place]: [place] Representative spoke with at [place]: [place] ([place]) Interventions     Readmission Risk Interventions Readmission Risk Prevention Plan 03/22/2020  Post Dischage Appt Complete  Medication Screening Complete  Transportation Screening Complete  Some recent data might be hidden

## 2020-03-22 NOTE — Progress Notes (Signed)
Physical Therapy Treatment Patient Details Name: Shannon [name] MRN: [id number] DOB: 1955-09-06 Today's Date: 03/22/2020    History of Present Illness 64 year old female with a history of an L4-5 PLIF in 2012 that returns with complaints of low back pain and right lower extremity radiculopathy, imaging revealed spondylolisthesis at L3-4 above her previous fusion. Pt underwent extension TLIF L3-4, revision of hardware L4-5.    PT Comments    The pt was agreeable to session with focus on sit-stand transfers with reduced assist as this remains her largest barrier for more independence with mobility. The pt was able to complete ~20 sit-stand transfers from various heights with progression to minG assist at each height before moving on to more challenging/lower surface. The pt reports reduced pain and improved confidence with improved transfers, but continues to benefit from cues for full knee and hip extension and upright posture prior to reaching for RW with her LUE. The pt will continue to benefit from skilled PT to progress functional strength, stability, and independence with transfers/mobility without the need for constant cues.     Follow Up Recommendations  Home health PT;Supervision/Assistance - 24 hour     Equipment Recommendations  Rolling walker with 5" wheels;3in1 (PT)    Recommendations for Other Services       Precautions / Restrictions Precautions Precautions: Back;Fall Precaution Booklet Issued: Yes (comment) Precaution Comments: Pt able to recall 3/3 back precautions and requires mod cues to adhere to precautions during session Restrictions Weight Bearing Restrictions: No    Mobility  Bed Mobility Overal bed mobility: Needs Assistance Bed Mobility: Rolling;Sidelying to Sit;Sit to Sidelying Rolling: Min assist Sidelying to sit: Min assist     Sit to sidelying: Min assist General bed mobility comments: minA to facilitate log roll, raise trunk from [facility], and  return BLE to bed  Transfers Overall transfer level: Needs assistance Equipment used: Rolling walker (2 wheeled) Transfers: Sit to/from Stand Sit to Stand: Min guard;Min assist         General transfer comment: pt performed x20 reps from EOB during session, focus on upright posture and stability before reaching with LUE to RW. started from elevated surface and gradually decreased height, with pt practicing at each height until she could preform at minG level with VC for knee and hip extension and upright posture (no crouching)  Ambulation/Gait             General Gait Details: declined today to focus on sit-stand transfers       Balance Overall balance assessment: Needs assistance Sitting-balance support: Feet supported;No upper extremity supported Sitting balance-Leahy Scale: Fair Sitting balance - Comments: able to maintain static standing with min guard assist   Standing balance support: Single extremity supported Standing balance-Leahy Scale: Poor Standing balance comment: pt able to static stand with single UE support                            Cognition Arousal/Alertness: Awake/alert Behavior During Therapy: WFL for tasks assessed/performed Overall Cognitive Status: Within Functional Limits for tasks assessed                                 General Comments: pt agreeable to all education, but benefits from repeated cues at times      Exercises Other Exercises Other Exercises: x20 sit-stand through session    General Comments General comments (skin integrity, edema,  etc.): VSS on RA      Pertinent Vitals/Pain Pain Assessment: 0-10 Pain Score: 7  Pain Location: back Pain Descriptors / Indicators: Operative site guarding;Sore;Sharp Pain Intervention(s): Limited activity within patient's tolerance;Monitored during session;Repositioned           PT Goals (current goals can now be found in the care plan section) Acute Rehab PT  Goals Patient Stated Goal: to be able to take care of self PT Goal Formulation: With patient Time For Goal Achievement: 04/03/20 Potential to Achieve Goals: Good Progress towards PT goals: Progressing toward goals    Frequency    Min 5X/week      PT Plan Current plan remains appropriate       AM-PAC PT "6 Clicks" Mobility   Outcome Measure  Help needed turning from your back to your side while in a flat bed without using bedrails?: A Little Help needed moving from lying on your back to sitting on the side of a flat bed without using bedrails?: A Little Help needed moving to and from a bed to a chair (including a wheelchair)?: A Little Help needed standing up from a chair using your arms (e.g., wheelchair or bedside chair)?: A Little Help needed to walk in hospital room?: A Little Help needed climbing 3-5 steps with a railing? : A Lot 6 Click Score: 17    End of Session Equipment Utilized During Treatment: Gait belt Activity Tolerance: Patient limited by pain Patient left: in bed;with call bell/phone within reach;with bed alarm set Nurse Communication: Mobility status PT Visit Diagnosis: Unsteadiness on feet (R26.81);Difficulty in walking, not elsewhere classified (R26.2);Pain Pain - part of body:  (back)     Time: 0388-8280 PT Time Calculation (min) (ACUTE ONLY): 39 min  Charges:  $Therapeutic Exercise: 23-37 mins $Therapeutic Activity: 8-22 mins                     Karma Ganja, PT, DPT   Acute Rehabilitation Department Pager #: [phone]   Otho Bellows 03/22/2020, 11:02 AM

## 2020-03-22 NOTE — Progress Notes (Signed)
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 Pt underwent extension TLIF L3-4, revision of hardware L4-5.   OT comments  Pt making steady progress towards OT goals this session. Pt continues to present with increased pain, impaired balance, and back precautions impacting pts ability to complete BADLS independently. Pt able to complete functional mobility from EOB<>bathroom with RW and MIN A. Pt with tendency to lean forward on RW and sink during session needing cues to incorporate back precautions into functional mobility and ADLs. Pt currently requires set- up for seated grooming tasks, and MOD A for LB ADLs with AE. Deferred standing UB ADLs to sitting secondary to fatigue and pt tendency to lean on sink despite cues. Pt would continue to benefit from skilled occupational therapy while admitted and after d/c to address the below listed limitations in order to improve overall functional mobility and facilitate independence with BADL participation. DC plan remains appropriate, will follow acutely per POC.     Follow Up Recommendations  Home health OT;Supervision/Assistance - 24 hour    Equipment Recommendations  3 in 1 bedside commode    Recommendations for Other Services      Precautions / Restrictions Precautions Precautions: Back;Fall Precaution Booklet Issued: Yes (comment) Precaution Comments: Pt able to recall 3/3 back precautions and requires mod cues to adhere to precautions during session Restrictions Weight Bearing Restrictions: No       Mobility Bed Mobility Overal bed mobility: Needs Assistance Bed Mobility: Supine to Sit Rolling: Min  assist Sidelying to sit: Min assist Supine to sit: Min assist   Sit to sidelying: Min guard General bed mobility comments: MIN A to elevate trunk into sitting from supine>sitting with cues for log roll technique, pt able to return to supine with minguard with good carryover of log roll technique from flat bed  Transfers Overall transfer level: Needs assistance Equipment used: Rolling walker (2 wheeled) Transfers: Sit to/from Stand Sit to Stand: Min guard;Min assist         General transfer comment: pt completed multiple sit<>stands during session needing cues for hand placement and MIN A to steadying while pt transitions BUEs from sitting surface RW. pt noted to perfrom to lean on sink needing cues to maintain back precautions frequently during session    Balance Overall balance assessment: Needs assistance Sitting-balance support: Feet supported;No upper extremity supported Sitting balance-Leahy Scale: Fair Sitting balance - Comments: able to maintain static standing with min guard assist   Standing balance support: Single extremity supported Standing balance-Leahy Scale: Poor Standing balance comment: pt able to static stand with single UE support                           ADL either performed or assessed with clinical judgement   ADL Overall ADL's : Needs assistance/impaired     Grooming: Oral care;Sitting;Set up;Cueing for compensatory techniques Grooming Details (indicate cue type and reason): cues to utlize cup to spit into vs leaning into sink from sitting       Lower Body Bathing Details (indicate cue type and reason): education and visual demo provided of using LH sponge for LB bathing     Lower Body Dressing: Moderate  assistance;Cueing for sequencing;Cueing for compensatory techniques;With adaptive equipment Lower Body Dressing Details (indicate cue type and reason): pt unable to figure four, pt able to use sock aid to don socks with MOD A needing cue  for sequncing of novel task. pt able to use reacher to don underwear with MOD A, cues needing for sequening of task with pt able needing MIN A to manage UB clothing while pt pull underwear to waist line in standing Toilet Transfer: Minimal assistance;Ambulation;RW;Grab bars Toilet Transfer Details (indicate cue type and reason): assist with sit <>stand needing cues for hand placement on grab bars Toileting- Clothing Manipulation and Hygiene: Minimal assistance;Sit to/from stand Toileting - Clothing Manipulation Details (indicate cue type and reason): to manage UB clothing during anterior pericare   Tub/Shower Transfer Details (indicate cue type and reason): pt reports tub shower at home with seat Functional mobility during ADLs: Minimal assistance;Rolling walker General ADL Comments: education provided on all LB AE for bathing and dressing, continues to present with impaired balance, increased pain, and decreased activity tolerance. pt required MOD cues to integrate back precautions into ADLs     Vision       Perception     Praxis      Cognition Arousal/Alertness: Awake/alert Behavior During Therapy: WFL for tasks assessed/performed Overall Cognitive Status: Within Functional Limits for tasks assessed                                 General Comments: pt agreeable to all education, but benefits from repeated cues at times        Exercises Exercises: Other exercises Other Exercises Other Exercises: x20 sit-stand through session   Shoulder Instructions       General Comments VSS on RA; incision remains clean and dry. issued pt reacher, LH sponge, and sock aid    Pertinent Vitals/ Pain       Pain Assessment: Faces Pain Score: 7  Faces Pain Scale: Hurts little more Pain Location: back Pain Descriptors / Indicators: Operative site guarding;Sore;Sharp Pain Intervention(s): Limited activity within patient's tolerance;Monitored during session;Repositioned  Home  Living                                          Prior Functioning/Environment              Frequency  Min 2X/week        Progress Toward Goals  OT Goals(current goals can now be found in the care plan section)  Progress towards OT goals: Progressing toward goals  Acute Rehab OT Goals Patient Stated Goal: to be able to take care of self OT Goal Formulation: With patient Time For Goal Achievement: 04/04/20 Potential to Achieve Goals: Good  Plan Discharge plan remains appropriate;Frequency remains appropriate    Co-evaluation                 AM-PAC OT "6 Clicks" Daily Activity     Outcome Measure   Help from another person eating meals?: None Help from another person taking care of personal grooming?: A Little Help from another person toileting, which includes using toliet, bedpan, or urinal?: A Little Help from another person bathing (including washing, rinsing, drying)?: A Lot Help from another person to put on and taking off regular upper body clothing?: A Little Help from another person to put  on and taking off regular lower body clothing?: A Lot 6 Click Score: 17    End of Session Equipment Utilized During Treatment: Gait belt;Rolling walker;Other (comment) (LB AE)  OT Visit Diagnosis: Pain   Activity Tolerance Patient tolerated treatment well   Patient Left in bed;with call bell/phone within reach;with bed alarm set   Nurse Communication Mobility status        Time: 1275-1700 OT Time Calculation (min): 31 min  Charges: OT General Charges $OT Visit: 1 Visit OT Treatments $Self Care/Home Management : 23-37 mins Lanier Clam., COTA/L Acute Rehabilitation Services [phone]    Ihor Gully 03/22/2020, 11:51 AM

## 2020-03-22 NOTE — Discharge Instructions (Signed)
OK TO SHOWER NO TAKING A BATH/SUBMERGING WOUND IN WATER

## 2020-03-23 DIAGNOSIS — H269 Unspecified cataract: Secondary | ICD-10-CM | POA: Diagnosis not present

## 2020-03-23 DIAGNOSIS — Z79899 Other long term (current) drug therapy: Secondary | ICD-10-CM | POA: Diagnosis not present

## 2020-03-23 DIAGNOSIS — Z8701 Personal history of pneumonia (recurrent): Secondary | ICD-10-CM | POA: Diagnosis not present

## 2020-03-23 DIAGNOSIS — Z9181 History of falling: Secondary | ICD-10-CM | POA: Diagnosis not present

## 2020-03-23 DIAGNOSIS — M5416 Radiculopathy, lumbar region: Secondary | ICD-10-CM | POA: Diagnosis not present

## 2020-03-23 DIAGNOSIS — Z981 Arthrodesis status: Secondary | ICD-10-CM | POA: Diagnosis not present

## 2020-03-23 DIAGNOSIS — R519 Headache, unspecified: Secondary | ICD-10-CM | POA: Diagnosis not present

## 2020-03-23 DIAGNOSIS — M48062 Spinal stenosis, lumbar region with neurogenic claudication: Secondary | ICD-10-CM | POA: Diagnosis not present

## 2020-03-23 DIAGNOSIS — Z87891 Personal history of nicotine dependence: Secondary | ICD-10-CM | POA: Diagnosis not present

## 2020-03-23 DIAGNOSIS — M4306 Spondylolysis, lumbar region: Secondary | ICD-10-CM | POA: Diagnosis not present

## 2020-03-23 DIAGNOSIS — H35039 Hypertensive retinopathy, unspecified eye: Secondary | ICD-10-CM | POA: Diagnosis not present

## 2020-03-23 DIAGNOSIS — R42 Dizziness and giddiness: Secondary | ICD-10-CM | POA: Diagnosis not present

## 2020-03-23 DIAGNOSIS — E782 Mixed hyperlipidemia: Secondary | ICD-10-CM | POA: Diagnosis not present

## 2020-03-23 DIAGNOSIS — M1990 Unspecified osteoarthritis, unspecified site: Secondary | ICD-10-CM | POA: Diagnosis not present

## 2020-03-23 DIAGNOSIS — Z4789 Encounter for other orthopedic aftercare: Secondary | ICD-10-CM | POA: Diagnosis not present

## 2020-03-23 DIAGNOSIS — M4316 Spondylolisthesis, lumbar region: Secondary | ICD-10-CM | POA: Diagnosis not present

## 2020-03-23 DIAGNOSIS — I10 Essential (primary) hypertension: Secondary | ICD-10-CM | POA: Diagnosis not present

## 2020-03-23 DIAGNOSIS — Z79891 Long term (current) use of opiate analgesic: Secondary | ICD-10-CM | POA: Diagnosis not present

## 2020-03-25 ENCOUNTER — Telehealth: Payer: Self-pay

## 2020-03-25 NOTE — Telephone Encounter (Signed)
Transition Care Management Follow-up Telephone Call  Date of discharge and from where: 03/22/2020, [facility]   How have you been since you were released from the hospital? She said she is " not good."  She explained that  she can walk but not well.     Any questions or concerns? No, only what is already noted about her ambulation.  Items Reviewed:  Did the pt receive and understand the discharge instructions provided? she said she has them but could not get them to review. instructed her to revirew, and she can call the clinic with any questions about the instructions or medications  Medications obtained and verified? she said she has the 2 new medications but still needs to review the med list.   Other? No   Any new allergies since your discharge? No   Do you have support at home? Yes   Home Care and Equipment/Supplies: Were home health services ordered? yes If so, what is the name of the agency?[place]  Has the agency set up a time to come to the patient's home? The home health nurse was out to see her yesterday and the PT is going to see her tomorrow.  Were any new equipment or medical supplies ordered?  Yes: rollator.    What is the name of the medical supply agency? [employer] Were you able to get the supplies/equipment?yes    Do you have any questions related to the use of the equipment or supplies? No   She already has a shower chair and hoveround.  She is requesting a 3:1 commode.   Functional Questionnaire: (I = Independent and D = Dependent) ADLs: independent, has assistance from friend and DME for mobility assistance.   Follow up appointments reviewed:   PCP Hospital f/u appt confirmed? Dr Wynetta Emery - 06/21/2020.  she will call the clinic if she needs to be seen sooner.    [facility] f/u appt confirmed? Yes- neurogurgery - 04/17/2020.   Are transportation arrangements needed?  If their condition worsens, is the pt aware to call PCP or  go to the Emergency Dept.?yes  Was the patient provided with contact information for the PCP's office or ED? She has the clinic phone number  Was to pt encouraged to call back with questions or concerns? yes

## 2020-03-26 ENCOUNTER — Telehealth: Payer: Self-pay | Admitting: Internal Medicine

## 2020-03-26 DIAGNOSIS — M48062 Spinal stenosis, lumbar region with neurogenic claudication: Secondary | ICD-10-CM | POA: Diagnosis not present

## 2020-03-26 DIAGNOSIS — Z4789 Encounter for other orthopedic aftercare: Secondary | ICD-10-CM | POA: Diagnosis not present

## 2020-03-26 DIAGNOSIS — M5416 Radiculopathy, lumbar region: Secondary | ICD-10-CM | POA: Diagnosis not present

## 2020-03-26 DIAGNOSIS — M1990 Unspecified osteoarthritis, unspecified site: Secondary | ICD-10-CM | POA: Diagnosis not present

## 2020-03-26 DIAGNOSIS — M4316 Spondylolisthesis, lumbar region: Secondary | ICD-10-CM | POA: Diagnosis not present

## 2020-03-26 DIAGNOSIS — I10 Essential (primary) hypertension: Secondary | ICD-10-CM | POA: Diagnosis not present

## 2020-03-26 NOTE — Telephone Encounter (Signed)
Mickie Bail, OT orders provided

## 2020-03-26 NOTE — Telephone Encounter (Signed)
Erlene Quan OT with [facility] is calling and would like pt to have OT 1x4

## 2020-03-28 DIAGNOSIS — I10 Essential (primary) hypertension: Secondary | ICD-10-CM | POA: Diagnosis not present

## 2020-03-28 DIAGNOSIS — M48062 Spinal stenosis, lumbar region with neurogenic claudication: Secondary | ICD-10-CM | POA: Diagnosis not present

## 2020-03-28 DIAGNOSIS — Z4789 Encounter for other orthopedic aftercare: Secondary | ICD-10-CM | POA: Diagnosis not present

## 2020-03-28 DIAGNOSIS — M5416 Radiculopathy, lumbar region: Secondary | ICD-10-CM | POA: Diagnosis not present

## 2020-03-28 DIAGNOSIS — M1990 Unspecified osteoarthritis, unspecified site: Secondary | ICD-10-CM | POA: Diagnosis not present

## 2020-03-28 DIAGNOSIS — M4316 Spondylolisthesis, lumbar region: Secondary | ICD-10-CM | POA: Diagnosis not present

## 2020-04-01 DIAGNOSIS — M4316 Spondylolisthesis, lumbar region: Secondary | ICD-10-CM | POA: Diagnosis not present

## 2020-04-01 DIAGNOSIS — M1990 Unspecified osteoarthritis, unspecified site: Secondary | ICD-10-CM | POA: Diagnosis not present

## 2020-04-01 DIAGNOSIS — I10 Essential (primary) hypertension: Secondary | ICD-10-CM | POA: Diagnosis not present

## 2020-04-01 DIAGNOSIS — M48062 Spinal stenosis, lumbar region with neurogenic claudication: Secondary | ICD-10-CM | POA: Diagnosis not present

## 2020-04-01 DIAGNOSIS — Z4789 Encounter for other orthopedic aftercare: Secondary | ICD-10-CM | POA: Diagnosis not present

## 2020-04-01 DIAGNOSIS — M5416 Radiculopathy, lumbar region: Secondary | ICD-10-CM | POA: Diagnosis not present

## 2020-04-02 DIAGNOSIS — M48062 Spinal stenosis, lumbar region with neurogenic claudication: Secondary | ICD-10-CM | POA: Diagnosis not present

## 2020-04-02 DIAGNOSIS — Z4789 Encounter for other orthopedic aftercare: Secondary | ICD-10-CM | POA: Diagnosis not present

## 2020-04-02 DIAGNOSIS — M5416 Radiculopathy, lumbar region: Secondary | ICD-10-CM | POA: Diagnosis not present

## 2020-04-02 DIAGNOSIS — I10 Essential (primary) hypertension: Secondary | ICD-10-CM | POA: Diagnosis not present

## 2020-04-02 DIAGNOSIS — M1990 Unspecified osteoarthritis, unspecified site: Secondary | ICD-10-CM | POA: Diagnosis not present

## 2020-04-02 DIAGNOSIS — M4316 Spondylolisthesis, lumbar region: Secondary | ICD-10-CM | POA: Diagnosis not present

## 2020-04-08 DIAGNOSIS — M48062 Spinal stenosis, lumbar region with neurogenic claudication: Secondary | ICD-10-CM | POA: Diagnosis not present

## 2020-04-08 DIAGNOSIS — M4316 Spondylolisthesis, lumbar region: Secondary | ICD-10-CM | POA: Diagnosis not present

## 2020-04-08 DIAGNOSIS — M5416 Radiculopathy, lumbar region: Secondary | ICD-10-CM | POA: Diagnosis not present

## 2020-04-08 DIAGNOSIS — M1990 Unspecified osteoarthritis, unspecified site: Secondary | ICD-10-CM | POA: Diagnosis not present

## 2020-04-08 DIAGNOSIS — Z4789 Encounter for other orthopedic aftercare: Secondary | ICD-10-CM | POA: Diagnosis not present

## 2020-04-08 DIAGNOSIS — I10 Essential (primary) hypertension: Secondary | ICD-10-CM | POA: Diagnosis not present

## 2020-04-10 DIAGNOSIS — M1990 Unspecified osteoarthritis, unspecified site: Secondary | ICD-10-CM | POA: Diagnosis not present

## 2020-04-10 DIAGNOSIS — M48062 Spinal stenosis, lumbar region with neurogenic claudication: Secondary | ICD-10-CM | POA: Diagnosis not present

## 2020-04-10 DIAGNOSIS — I10 Essential (primary) hypertension: Secondary | ICD-10-CM | POA: Diagnosis not present

## 2020-04-10 DIAGNOSIS — M5416 Radiculopathy, lumbar region: Secondary | ICD-10-CM | POA: Diagnosis not present

## 2020-04-10 DIAGNOSIS — M4316 Spondylolisthesis, lumbar region: Secondary | ICD-10-CM | POA: Diagnosis not present

## 2020-04-10 DIAGNOSIS — Z4789 Encounter for other orthopedic aftercare: Secondary | ICD-10-CM | POA: Diagnosis not present

## 2020-04-16 DIAGNOSIS — Z4789 Encounter for other orthopedic aftercare: Secondary | ICD-10-CM | POA: Diagnosis not present

## 2020-04-16 DIAGNOSIS — M4316 Spondylolisthesis, lumbar region: Secondary | ICD-10-CM | POA: Diagnosis not present

## 2020-04-16 DIAGNOSIS — M1990 Unspecified osteoarthritis, unspecified site: Secondary | ICD-10-CM | POA: Diagnosis not present

## 2020-04-16 DIAGNOSIS — M5416 Radiculopathy, lumbar region: Secondary | ICD-10-CM | POA: Diagnosis not present

## 2020-04-16 DIAGNOSIS — M48062 Spinal stenosis, lumbar region with neurogenic claudication: Secondary | ICD-10-CM | POA: Diagnosis not present

## 2020-04-16 DIAGNOSIS — I10 Essential (primary) hypertension: Secondary | ICD-10-CM | POA: Diagnosis not present

## 2020-04-17 DIAGNOSIS — M48062 Spinal stenosis, lumbar region with neurogenic claudication: Secondary | ICD-10-CM | POA: Diagnosis not present

## 2020-04-17 DIAGNOSIS — M5416 Radiculopathy, lumbar region: Secondary | ICD-10-CM | POA: Diagnosis not present

## 2020-04-17 DIAGNOSIS — Z4789 Encounter for other orthopedic aftercare: Secondary | ICD-10-CM | POA: Diagnosis not present

## 2020-04-17 DIAGNOSIS — M1990 Unspecified osteoarthritis, unspecified site: Secondary | ICD-10-CM | POA: Diagnosis not present

## 2020-04-17 DIAGNOSIS — I10 Essential (primary) hypertension: Secondary | ICD-10-CM | POA: Diagnosis not present

## 2020-04-17 DIAGNOSIS — M4316 Spondylolisthesis, lumbar region: Secondary | ICD-10-CM | POA: Diagnosis not present

## 2020-04-22 ENCOUNTER — Telehealth: Payer: Self-pay | Admitting: Internal Medicine

## 2020-04-22 DIAGNOSIS — H269 Unspecified cataract: Secondary | ICD-10-CM | POA: Diagnosis not present

## 2020-04-22 DIAGNOSIS — I10 Essential (primary) hypertension: Secondary | ICD-10-CM | POA: Diagnosis not present

## 2020-04-22 DIAGNOSIS — Z79899 Other long term (current) drug therapy: Secondary | ICD-10-CM | POA: Diagnosis not present

## 2020-04-22 DIAGNOSIS — Z87891 Personal history of nicotine dependence: Secondary | ICD-10-CM | POA: Diagnosis not present

## 2020-04-22 DIAGNOSIS — M48062 Spinal stenosis, lumbar region with neurogenic claudication: Secondary | ICD-10-CM | POA: Diagnosis not present

## 2020-04-22 DIAGNOSIS — Z79891 Long term (current) use of opiate analgesic: Secondary | ICD-10-CM | POA: Diagnosis not present

## 2020-04-22 DIAGNOSIS — M5416 Radiculopathy, lumbar region: Secondary | ICD-10-CM | POA: Diagnosis not present

## 2020-04-22 DIAGNOSIS — Z9181 History of falling: Secondary | ICD-10-CM | POA: Diagnosis not present

## 2020-04-22 DIAGNOSIS — R42 Dizziness and giddiness: Secondary | ICD-10-CM | POA: Diagnosis not present

## 2020-04-22 DIAGNOSIS — H35039 Hypertensive retinopathy, unspecified eye: Secondary | ICD-10-CM | POA: Diagnosis not present

## 2020-04-22 DIAGNOSIS — M4316 Spondylolisthesis, lumbar region: Secondary | ICD-10-CM | POA: Diagnosis not present

## 2020-04-22 DIAGNOSIS — Z4789 Encounter for other orthopedic aftercare: Secondary | ICD-10-CM | POA: Diagnosis not present

## 2020-04-22 DIAGNOSIS — Z981 Arthrodesis status: Secondary | ICD-10-CM | POA: Diagnosis not present

## 2020-04-22 DIAGNOSIS — Z8701 Personal history of pneumonia (recurrent): Secondary | ICD-10-CM | POA: Diagnosis not present

## 2020-04-22 DIAGNOSIS — E782 Mixed hyperlipidemia: Secondary | ICD-10-CM | POA: Diagnosis not present

## 2020-04-22 DIAGNOSIS — M1990 Unspecified osteoarthritis, unspecified site: Secondary | ICD-10-CM | POA: Diagnosis not present

## 2020-04-22 DIAGNOSIS — R519 Headache, unspecified: Secondary | ICD-10-CM | POA: Diagnosis not present

## 2020-04-22 NOTE — Telephone Encounter (Signed)
Erlene Quan OT with [facility] is calling and pt needs OT 1x3

## 2020-04-22 NOTE — Telephone Encounter (Signed)
Returned McKenzie call and left a detailed vm giving verbal orders and if he has any questions or concerns to give a call

## 2020-04-24 DIAGNOSIS — G5600 Carpal tunnel syndrome, unspecified upper limb: Secondary | ICD-10-CM | POA: Insufficient documentation

## 2020-04-25 DIAGNOSIS — Z4789 Encounter for other orthopedic aftercare: Secondary | ICD-10-CM | POA: Diagnosis not present

## 2020-04-25 DIAGNOSIS — M5416 Radiculopathy, lumbar region: Secondary | ICD-10-CM | POA: Diagnosis not present

## 2020-04-25 DIAGNOSIS — M48062 Spinal stenosis, lumbar region with neurogenic claudication: Secondary | ICD-10-CM | POA: Diagnosis not present

## 2020-04-25 DIAGNOSIS — I10 Essential (primary) hypertension: Secondary | ICD-10-CM | POA: Diagnosis not present

## 2020-04-25 DIAGNOSIS — M4316 Spondylolisthesis, lumbar region: Secondary | ICD-10-CM | POA: Diagnosis not present

## 2020-04-25 DIAGNOSIS — M1990 Unspecified osteoarthritis, unspecified site: Secondary | ICD-10-CM | POA: Diagnosis not present

## 2020-04-29 DIAGNOSIS — I10 Essential (primary) hypertension: Secondary | ICD-10-CM | POA: Diagnosis not present

## 2020-04-29 DIAGNOSIS — M1990 Unspecified osteoarthritis, unspecified site: Secondary | ICD-10-CM | POA: Diagnosis not present

## 2020-04-29 DIAGNOSIS — Z4789 Encounter for other orthopedic aftercare: Secondary | ICD-10-CM | POA: Diagnosis not present

## 2020-04-29 DIAGNOSIS — M48062 Spinal stenosis, lumbar region with neurogenic claudication: Secondary | ICD-10-CM | POA: Diagnosis not present

## 2020-04-29 DIAGNOSIS — M4316 Spondylolisthesis, lumbar region: Secondary | ICD-10-CM | POA: Diagnosis not present

## 2020-04-29 DIAGNOSIS — M5416 Radiculopathy, lumbar region: Secondary | ICD-10-CM | POA: Diagnosis not present

## 2020-05-01 DIAGNOSIS — M1990 Unspecified osteoarthritis, unspecified site: Secondary | ICD-10-CM | POA: Diagnosis not present

## 2020-05-01 DIAGNOSIS — M5416 Radiculopathy, lumbar region: Secondary | ICD-10-CM | POA: Diagnosis not present

## 2020-05-01 DIAGNOSIS — Z4789 Encounter for other orthopedic aftercare: Secondary | ICD-10-CM | POA: Diagnosis not present

## 2020-05-01 DIAGNOSIS — M4316 Spondylolisthesis, lumbar region: Secondary | ICD-10-CM | POA: Diagnosis not present

## 2020-05-01 DIAGNOSIS — I10 Essential (primary) hypertension: Secondary | ICD-10-CM | POA: Diagnosis not present

## 2020-05-01 DIAGNOSIS — M48062 Spinal stenosis, lumbar region with neurogenic claudication: Secondary | ICD-10-CM | POA: Diagnosis not present

## 2020-05-10 DIAGNOSIS — M48062 Spinal stenosis, lumbar region with neurogenic claudication: Secondary | ICD-10-CM | POA: Diagnosis not present

## 2020-05-10 DIAGNOSIS — M1990 Unspecified osteoarthritis, unspecified site: Secondary | ICD-10-CM | POA: Diagnosis not present

## 2020-05-10 DIAGNOSIS — Z4789 Encounter for other orthopedic aftercare: Secondary | ICD-10-CM | POA: Diagnosis not present

## 2020-05-10 DIAGNOSIS — I10 Essential (primary) hypertension: Secondary | ICD-10-CM | POA: Diagnosis not present

## 2020-05-10 DIAGNOSIS — M5416 Radiculopathy, lumbar region: Secondary | ICD-10-CM | POA: Diagnosis not present

## 2020-05-10 DIAGNOSIS — M4316 Spondylolisthesis, lumbar region: Secondary | ICD-10-CM | POA: Diagnosis not present

## 2020-05-20 ENCOUNTER — Other Ambulatory Visit: Payer: Self-pay | Admitting: Family Medicine

## 2020-05-21 ENCOUNTER — Other Ambulatory Visit: Payer: Self-pay | Admitting: Internal Medicine

## 2020-05-21 DIAGNOSIS — Z1231 Encounter for screening mammogram for malignant neoplasm of breast: Secondary | ICD-10-CM

## 2020-06-21 ENCOUNTER — Ambulatory Visit: Payer: Medicare Other | Admitting: Internal Medicine

## 2020-06-22 ENCOUNTER — Other Ambulatory Visit: Payer: Self-pay | Admitting: Internal Medicine

## 2020-06-22 DIAGNOSIS — I10 Essential (primary) hypertension: Secondary | ICD-10-CM

## 2020-06-22 NOTE — Telephone Encounter (Signed)
Requested Prescriptions  Pending Prescriptions Disp Refills  . hydrochlorothiazide (HYDRODIURIL) 12.5 MG tablet [Pharmacy Med Name: HYDROCHLOROTHIAZIDE 12.5MG  TABLETS] 90 tablet 0    Sig: TAKE 1 TABLET(12.5 MG) BY MOUTH DAILY     Cardiovascular: Diuretics - Thiazide Failed - 06/22/2020 11:28 AM      Failed - Ca in normal range and within 360 days    Calcium  Date Value Ref Range Status  03/20/2020 8.7 (L) 8.9 - 10.3 mg/dL Final   Calcium, Ion  Date Value Ref Range Status  07/05/2008 1.21 1.12 - 1.32 mmol/L Final         Passed - Cr in normal range and within 360 days    Creatinine  Date Value Ref Range Status  09/01/2019 166.7 20.0 - 300.0 mg/dL Final   Creat  Date Value Ref Range Status  05/02/2015 0.90 0.50 - 1.05 mg/dL Final   Creatinine, Ser  Date Value Ref Range Status  03/20/2020 0.84 0.44 - 1.00 mg/dL Final         Passed - K in normal range and within 360 days    Potassium  Date Value Ref Range Status  03/20/2020 4.0 3.5 - 5.1 mmol/L Final         Passed - Na in normal range and within 360 days    Sodium  Date Value Ref Range Status  03/20/2020 141 135 - 145 mmol/L Final  09/01/2019 145 (H) 134 - 144 mmol/L Final         Passed - Last BP in normal range    BP Readings from Last 1 Encounters:  03/22/20 101/65         Passed - Valid encounter within last 6 months    Recent Outpatient Visits          4 months ago Essential hypertension   [place], Deborah B, MD   9 months ago Essential hypertension   [place], Deborah B, MD   1 year ago Essential hypertension   [place], Deborah B, MD   1 year ago Essential hypertension   [place], Stephen L, RPH-CPP   1 year ago Essential hypertension   [place], Jarome Matin, RPH-CPP      Future Appointments             In 1 week Ladell Pier, MD [place]

## 2020-07-03 ENCOUNTER — Encounter: Payer: Self-pay | Admitting: Internal Medicine

## 2020-07-03 DIAGNOSIS — K648 Other hemorrhoids: Secondary | ICD-10-CM | POA: Insufficient documentation

## 2020-07-04 ENCOUNTER — Ambulatory Visit: Payer: Medicare Other | Attending: Internal Medicine | Admitting: Internal Medicine

## 2020-07-04 ENCOUNTER — Encounter: Payer: Self-pay | Admitting: Internal Medicine

## 2020-07-04 ENCOUNTER — Other Ambulatory Visit: Payer: Self-pay

## 2020-07-04 ENCOUNTER — Other Ambulatory Visit: Payer: Self-pay | Admitting: Internal Medicine

## 2020-07-04 VITALS — BP 142/90 | HR 65 | Resp 16 | Wt 160.2 lb

## 2020-07-04 DIAGNOSIS — R634 Abnormal weight loss: Secondary | ICD-10-CM | POA: Diagnosis not present

## 2020-07-04 DIAGNOSIS — F1410 Cocaine abuse, uncomplicated: Secondary | ICD-10-CM

## 2020-07-04 DIAGNOSIS — L602 Onychogryphosis: Secondary | ICD-10-CM

## 2020-07-04 DIAGNOSIS — D649 Anemia, unspecified: Secondary | ICD-10-CM | POA: Diagnosis not present

## 2020-07-04 DIAGNOSIS — I10 Essential (primary) hypertension: Secondary | ICD-10-CM

## 2020-07-04 DIAGNOSIS — Z23 Encounter for immunization: Secondary | ICD-10-CM | POA: Diagnosis not present

## 2020-07-04 DIAGNOSIS — R7303 Prediabetes: Secondary | ICD-10-CM

## 2020-07-04 DIAGNOSIS — R0981 Nasal congestion: Secondary | ICD-10-CM

## 2020-07-04 DIAGNOSIS — M170 Bilateral primary osteoarthritis of knee: Secondary | ICD-10-CM

## 2020-07-04 DIAGNOSIS — Z1331 Encounter for screening for depression: Secondary | ICD-10-CM

## 2020-07-04 MED ORDER — FLUTICASONE PROPIONATE 50 MCG/ACT NA SUSP: 2.0000 | Freq: Every day | NASAL | 0 refills | Status: DC

## 2020-07-04 MED ORDER — AMLODIPINE BESYLATE 10 MG PO TABS: 10.0000 mg | ORAL_TABLET | Freq: Every day | ORAL | 1 refills | Status: DC

## 2020-07-04 NOTE — Progress Notes (Signed)
Patient ID: Shannon [name], female    DOB: 1956/01/22  MRN: [id number]  CC: Hypertension   Subjective: Shannon [name] is a 65 y.o. female who presents for chronic ds management Her concerns today include:  Patient with history of HTN,pre-DM,chronic lower back pain ( hx of L4-5 decompression surgery), HL,ETOH use disorder, OA knees, vit D def.   Since last visit with me patient had L3-4 lumbar fusion for stenosis and radiculopathy and spondylolisthesis 03/2020 by Dr. Reatha Armour.  Noted to have postoperative anemia.  She denies any dizziness. She reports that the surgery went well.  She completed physical therapy. Still walks with cane and walker.  Uses her [place] Left Around when she has to do a lot of walking like going to grocery store.  Also uses it in her house when she has to do chores. Knee pain controlled with Norco through Dr. Reatha Armour.she reports he prescribes it for osteoarthritis pain in her knees and for her back pain.  No falls since last visit  HYPERTENSION Currently taking: see medication list.  She is on amlodipine, hydrochlorothiazide, lisinopril and metoprolol.  She took her medicines already for the morning. Med Adherence: [x]  Yes    []  No Medication side effects: []  Yes    [x]  No Adherence with salt restriction: [x]  Yes    []  No Home Monitoring?: []  Yes    [x]  No, does not have a device Monitoring Frequency: []  Yes    []  No Home BP results range: []  Yes    []  No SOB? []  Yes    [x]  No Chest Pain?: []  Yes    [x]  No Leg swelling?: []  Yes    [x]  No Headaches?: []  Yes    [x]  No Dizziness? []  Yes    [x]  No Comments:   Request RF on Flonase for nasal congestion.  Little sneezing.  No post nasal drip or itchy throat.    Loss 8 lbs since 03/2020.  Not eating as much. No blood in stools.  Moving bowels okay.  No dysphagia.   No chronic cough No fever or night sweats No palpitations Drinking one 12 oz can a day of beer.  No Liquor.   Admits that she still uses  cocaine once a day.  "I know I should not be doing it."  Friend buys it for her.  Declines referral to treatment program.  Feels she can kick it on her own by giving up certain friends.  Prediabetes: She has history of prediabetes.  She feels she is doing okay with her eating habits and has cut back on the amount that she eats.  She is due for A1c.  Pos Depression Screen:  When asked, she states she does not have an issue with depression but feels it could be playing a role with cocaine use.  Declines referral to [facility]  Request referral to podiatry to have her toenails clipped.  HM:  Completed 2 shots of Pfizer COVID vaccine.  Due for booster.  She does not have her card with her.   Patient Active Problem List   Diagnosis Date Noted  . Internal hemorrhoids 07/03/2020  . Lumbar adjacent segment disease with spondylolisthesis 03/19/2020  . Recurrent falls 02/22/2020  . Prediabetes 03/24/2019  . Vitamin D deficiency 03/24/2019  . Influenza vaccine needed 12/23/2018  . Renal insufficiency 08/22/2018  . Obesity (BMI 30-39.9) 01/15/2017  . Alcohol use disorder, mild, abuse 01/15/2017  . Closed fracture of left distal femur ([place]) 09/03/2016  . Osteoarthritis of  left knee 05/02/2015  . Postlaminectomy syndrome, lumbar region 11/09/2013  . Lumbosacral spondylosis without myelopathy 11/09/2013  . Dyslipidemia 10/11/2013  . Ventral hernia 05/19/2013  . DEPRESSION 06/13/2010  . DIVERTICULOSIS, COLON 07/21/2009  . SYPHILIS 05/14/2009  . DYSHIDROTIC ECZEMA, HANDS 11/06/2008  . Chronic low back pain 11/06/2008  . FIBROIDS, UTERUS 11/17/2007  . Essential hypertension 11/17/2007     Current Outpatient Medications on File Prior to Visit  Medication Sig Dispense Refill  . gabapentin (NEURONTIN) 300 MG capsule Take 1 capsule (300 mg total) by mouth 3 (three) times daily. 90 capsule 3  . hydrochlorothiazide (HYDRODIURIL) 12.5 MG tablet TAKE 1 TABLET(12.5 MG) BY MOUTH DAILY 90 tablet 0  .  HYDROcodone-acetaminophen (NORCO) 10-325 MG tablet Take 1-2 tablets by mouth every 4 (four) hours as needed for moderate pain ((score 4 to 6)). 30 tablet 0  . lisinopril (ZESTRIL) 40 MG tablet TAKE 1 TABLET(40 MG) BY MOUTH DAILY 90 tablet 0  . methocarbamol (ROBAXIN) 500 MG tablet Take 1 tablet (500 mg total) by mouth every 6 (six) hours as needed for muscle spasms. (Patient not taking: Reported on 07/04/2020) 90 tablet 2  . metoprolol succinate (TOPROL-XL) 100 MG 24 hr tablet TAKE 1 TABLET BY MOUTH DAILY WITH OR IMMEDIATELY FOLLOWING A MEAL 90 tablet 0   No current facility-administered medications on file prior to visit.    Allergies  Allergen Reactions  . Morphine And Related Itching    Social History   Socioeconomic History  . Marital status: Legally Separated    Spouse name: Not on file  . Number of children: Not on file  . Years of education: Not on file  . Highest education level: Not on file  Occupational History  . Not on file  Tobacco Use  . Smoking status: Former Research scientist (life sciences)  . Smokeless tobacco: Never Used  Vaping Use  . Vaping Use: Never used  Substance and Sexual Activity  . Alcohol use: Yes    Alcohol/week: 7.0 standard drinks    Types: 7 Cans of beer per week    Comment: 1 can of beer/day  . Drug use: No  . Sexual activity: Yes    Birth control/protection: Surgical  Other Topics Concern  . Not on file  Social History Narrative   ** Merged History Encounter **       Social Determinants of Health   Financial Resource Strain: Not on file  Food Insecurity: Not on file  Transportation Needs: Not on file  Physical Activity: Not on file  Stress: Not on file  Social Connections: Not on file  Intimate Partner Violence: Not on file    Family History  Problem Relation Age of Onset  . Stroke Mother   . Breast cancer Sister     Past Surgical History:  Procedure Laterality Date  . ABDOMINAL HYSTERECTOMY    . BACK SURGERY  09/01/2010   L4-5 lumbar  decompression including laminotomy, facetectomy,   . colonscopy    . FEMUR IM NAIL Left 09/05/2016  . FEMUR IM NAIL Left 09/05/2016   Procedure: INTRAMEDULLARY (IM) NAIL FEMORAL;  Surgeon: Nicholes Stairs, MD;  Location: [place];  Service: Orthopedics;  Laterality: Left;  . FRACTURE SURGERY     left leg  . INSERTION OF MESH N/A 05/24/2013   Procedure: INSERTION OF MESH;  Surgeon: Imogene Burn. Georgette Dover, MD;  Location: [place];  Service: General;  Laterality: N/A;  . TRANSFORAMINAL LUMBAR INTERBODY FUSION W/ MIS 1 LEVEL N/A 03/19/2020   Procedure:  MINIMALLY INVASIVE TRANSFORAMINAL INTERBODY FUSION LUMBAR THREE- LUMBAR FOUR WITH REVISION OF HARDWARE LUMBAR FOUR - LUMBAR FIVE;  Surgeon: Karsten Ro, DO;  Location: [place];  Service: Neurosurgery;  Laterality: N/A;  MINIMALLY INVASIVE TRANSFORAMINAL INTERBODY FUSION LUMBAR THREE- LUMBAR FOUR WITH REVISION OF HARDWARE LUMBAR FOUR - LUMBAR FIVE  . UTERINE FIBROID SURGERY    . VENTRAL HERNIA REPAIR N/A 05/24/2013   Procedure: OPEN REPAIR EPIGASTRIC VENTRAL HERNIA;  Surgeon: Imogene Burn. Tsuei, MD;  Location: [facility];  Service: General;  Laterality: N/A;    ROS: Review of Systems Negative except as stated above  PHYSICAL EXAM: BP (!) 142/90   Pulse 65   Resp 16   Wt 160 lb 3.2 oz (72.7 kg)   SpO2 96%   BMI 29.30 kg/m   Wt Readings from Last 3 Encounters:  07/04/20 160 lb 3.2 oz (72.7 kg)  03/19/20 168 lb (76.2 kg)  03/12/20 169 lb 9.6 oz (76.9 kg)    Physical Exam  General appearance - alert, well appearing, older African-American female and in no distress.  She has some noted weight loss. Mental status - normal mood, behavior, speech, dress, motor activity, and thought processes Mouth - mucous membranes moist, pharynx normal without lesions Neck - supple, no significant adenopathy Chest - clear to auscultation, no wheezes, rales or rhonchi, symmetric air entry Heart - normal rate, regular rhythm, normal S1, S2, no murmurs, rubs, clicks or  gallops Musculoskeletal -patient noted to ambulate with her cane and transfers independently to the exam table.  Gait is slowed.  Knees: Mild to moderate enlargement of both joints.  Moderate crepitus on passive range of motion Extremities -no lower extremity edema  Skin: Toenails are discolored and slightly overgrown. Depression screen [facility] 2/9 07/04/2020 09/01/2019 12/23/2018  Decreased Interest 2 3 0  Down, Depressed, Hopeless 1 2 2   PHQ - 2 Score 3 5 2   Altered sleeping 2 0 0  Tired, decreased energy 1 1 3   Change in appetite 2 0 0  Feeling bad or failure about yourself  2 2 2   Trouble concentrating 0 0 0  Moving slowly or fidgety/restless 1 0 2  Suicidal thoughts 0 0 0  PHQ-9 Score 11 8 9   Some recent data might be hidden   GAD 7 : Generalized Anxiety Score 07/04/2020 09/01/2019 12/23/2018 05/12/2018  Nervous, Anxious, on Edge 0 0 0 2  Control/stop worrying 1 2 1 1   Worry too much - different things 2 0 1 2  Trouble relaxing 2 0 2 2  Restless 0 0 2 1  Easily annoyed or irritable 0 0 0 0  Afraid - awful might happen 2 2 2 1   Total GAD 7 Score 7 4 8 9      CMP Latest Ref Rng & Units 03/20/2020 03/19/2020 03/12/2020  Glucose 70 - 99 mg/dL 88 - 98  BUN 8 - 23 mg/dL 12 - 14  Creatinine 0.44 - 1.00 mg/dL 0.84 0.98 0.76  Sodium 135 - 145 mmol/L 141 - 140  Potassium 3.5 - 5.1 mmol/L 4.0 - 4.5  Chloride 98 - 111 mmol/L 108 - 107  CO2 22 - 32 mmol/L 24 - 23  Calcium 8.9 - 10.3 mg/dL 8.7(L) - 9.6  Total Protein 6.5 - 8.1 g/dL - - 8.3(H)  Total Bilirubin 0.3 - 1.2 mg/dL - - 0.6  Alkaline Phos 38 - 126 U/L - - 57  AST 15 - 41 U/L - - 21  ALT 0 - 44 U/L - -  28   Lipid Panel     Component Value Date/Time   CHOL 195 09/01/2019 1125   TRIG 61 09/01/2019 1125   HDL 82 09/01/2019 1125   CHOLHDL 2.4 09/01/2019 1125   CHOLHDL 2.9 05/31/2014 1457   VLDL 17 05/31/2014 1457   LDLCALC 102 (H) 09/01/2019 1125    CBC    Component Value Date/Time   WBC 10.4 03/20/2020 0424   RBC 4.50  03/20/2020 0424   HGB 10.4 (L) 03/20/2020 0424   HGB 14.7 09/01/2019 1125   HCT 33.3 (L) 03/20/2020 0424   HCT 48.3 (H) 09/01/2019 1125   PLT 305 03/20/2020 0424   PLT 360 09/01/2019 1125   MCV 74.0 (L) 03/20/2020 0424   MCV 77 (L) 09/01/2019 1125   MCH 23.1 (L) 03/20/2020 0424   MCHC 31.2 03/20/2020 0424   RDW 14.4 03/20/2020 0424   RDW 15.2 09/01/2019 1125   LYMPHSABS 1.9 09/03/2016 1440   MONOABS 0.2 09/03/2016 1440   EOSABS 0.1 09/03/2016 1440   BASOSABS 0.0 09/03/2016 1440    ASSESSMENT AND PLAN: 1. Essential hypertension Not at goal. Recommend increasing Norvasc to 10 mg daily.  Continue other medications. Strongly advised that she discontinue using cocaine as it is probably contributing to uncontrolled blood pressure. - Lipid panel - amLODipine (NORVASC) 10 MG tablet; Take 1 tablet (10 mg total) by mouth daily.  Dispense: 90 tablet; Refill: 1  2. Cocaine abuse ([place]) Advised to quit.  Discussed health risks associated with cocaine use.  Can cause acute cardiovascular events, can be toxic to the heart, contributes to uncontrolled blood pressure.  Patient wants to quit.  She declines referral to a treatment program.  She states that she would try to avoid the friends who encourage and purchases for her.  3. Unintended weight loss Daily cocaine use likely playing a role.  Again encouraged her to quit. She is up-to-date with colon cancer screening.  Scheduled for mammogram later this month. - TSH+T4F+T3Free  4. Overgrown toenails Referral to podiatry  5. Positive depression screening Patient does not feel this is a major issue for her.  She declines medication or referral to behavioral health at this time.  6. Primary osteoarthritis of both knees Weight loss is good and that it will take the mechanical strain of the joints.  However if weight loss is being achieved through cocaine use, I discourage that.  She continues to need assistive devices including her cane, walker  and mobility chair  7. Need for immunization against influenza - Flu Vaccine QUAD 62mo+IM (Fluarix, Fluzone & Alfiuria Quad PF)  8. Prediabetes Discussed and encourage healthy eating habits - Hemoglobin A1c  9. Sinus congestion Refill Flonase  10. Postoperative anemia - CBC    Patient was given the opportunity to ask questions.  Patient verbalized understanding of the plan and was able to repeat key elements of the plan.   Orders Placed This Encounter  Procedures  . Flu Vaccine QUAD 29mo+IM (Fluarix, Fluzone & Alfiuria Quad PF)  . CBC  . Lipid panel  . Hemoglobin A1c  . TSH+T4F+T3Free     Requested Prescriptions   Signed Prescriptions Disp Refills  . amLODipine (NORVASC) 10 MG tablet 90 tablet 1    Sig: Take 1 tablet (10 mg total) by mouth daily.    Return in about 4 months (around 11/03/2020) for Give appt with Lurena Joiner in 1 mth for Medicare Wellness Visit.Karle Plumber, MD, FACP

## 2020-07-04 NOTE — Patient Instructions (Addendum)
Increase amlodipine to 10 mg daily. I encourage you to give up using cocaine to prevent further health issues.  I think it is contributing to uncontrolled blood pressure and to your weight loss.  Influenza Virus Vaccine injection (Fluarix) What is this medicine? INFLUENZA VIRUS VACCINE (in floo EN zuh VAHY ruhs vak SEEN) helps to reduce the risk of getting influenza also known as the flu. This medicine may be used for other purposes; ask your health care provider or pharmacist if you have questions. COMMON BRAND NAME(S): Fluarix, Fluzone What should I tell my health care provider before I take this medicine? They need to know if you have any of these conditions:  bleeding disorder like hemophilia  fever or infection  Guillain-Barre syndrome or other neurological problems  immune system problems  infection with the human immunodeficiency virus (HIV) or AIDS  low blood platelet counts  multiple sclerosis  an unusual or allergic reaction to influenza virus vaccine, eggs, chicken proteins, latex, gentamicin, other medicines, foods, dyes or preservatives  pregnant or trying to get pregnant  breast-feeding How should I use this medicine? This vaccine is for injection into a muscle. It is given by a health care professional. A copy of Vaccine Information Statements will be given before each vaccination. Read this sheet carefully each time. The sheet may change frequently. Talk to your pediatrician regarding the use of this medicine in children. Special care may be needed. Overdosage: If you think you have taken too much of this medicine contact a poison control center or emergency room at once. NOTE: This medicine is only for you. Do not share this medicine with others. What if I miss a dose? This does not apply. What may interact with this medicine?  chemotherapy or radiation therapy  medicines that lower your immune system like etanercept, anakinra, infliximab, and  adalimumab  medicines that treat or prevent blood clots like warfarin  phenytoin  steroid medicines like prednisone or cortisone  theophylline  vaccines This list may not describe all possible interactions. Give your health care provider a list of all the medicines, herbs, non-prescription drugs, or dietary supplements you use. Also tell them if you smoke, drink alcohol, or use illegal drugs. Some items may interact with your medicine. What should I watch for while using this medicine? Report any side effects that do not go away within 3 days to your doctor or health care professional. Call your health care provider if any unusual symptoms occur within 6 weeks of receiving this vaccine. You may still catch the flu, but the illness is not usually as bad. You cannot get the flu from the vaccine. The vaccine will not protect against colds or other illnesses that may cause fever. The vaccine is needed every year. What side effects may I notice from receiving this medicine? Side effects that you should report to your doctor or health care professional as soon as possible:  allergic reactions like skin rash, itching or hives, swelling of the face, lips, or tongue Side effects that usually do not require medical attention (report to your doctor or health care professional if they continue or are bothersome):  fever  headache  muscle aches and pains  pain, tenderness, redness, or swelling at site where injected  weak or tired This list may not describe all possible side effects. Call your doctor for medical advice about side effects. You may report side effects to FDA at 1-800-FDA-1088. Where should I keep my medicine? This vaccine is only given  in a clinic, pharmacy, doctor's office, or other health care setting and will not be stored at home. NOTE: This sheet is a summary. It may not cover all possible information. If you have questions about this medicine, talk to your doctor, pharmacist,  or health care provider.  2021 Elsevier/Gold Standard (2007-10-26 09:30:40)

## 2020-07-05 LAB — LIPID PANEL
Chol/HDL Ratio: 2.5 ratio (ref 0.0–4.4)
Cholesterol, Total: 167 mg/dL (ref 100–199)
HDL: 66 mg/dL (ref >39)
LDL Chol Calc (NIH): 82 mg/dL (ref 0–99)
Triglycerides: 106 mg/dL (ref 0–149)
VLDL Cholesterol Cal: 19 mg/dL (ref 5–40)

## 2020-07-05 LAB — CBC
Hematocrit: 49.7 % — ABNORMAL HIGH (ref 34.0–46.6)
Hemoglobin: 14.4 g/dL (ref 11.1–15.9)
MCH: 21.9 pg — ABNORMAL LOW (ref 26.6–33.0)
MCHC: 29 g/dL — ABNORMAL LOW (ref 31.5–35.7)
MCV: 75 fL — ABNORMAL LOW (ref 79–97)
Platelets: 381 10*3/uL (ref 150–450)
RBC: 6.59 x10E6/uL — ABNORMAL HIGH (ref 3.77–5.28)
RDW: 17.2 % — ABNORMAL HIGH (ref 11.7–15.4)
WBC: 6.5 10*3/uL (ref 3.4–10.8)

## 2020-07-05 LAB — TSH+T4F+T3FREE
Free T4: 1.45 ng/dL (ref 0.82–1.77)
T3, Free: 3.1 pg/mL (ref 2.0–4.4)
TSH: 0.622 u[IU]/mL (ref 0.450–4.500)

## 2020-07-05 LAB — HEMOGLOBIN A1C
Est. average glucose Bld gHb Est-mCnc: 114 mg/dL
Hgb A1c MFr Bld: 5.6 % (ref 4.8–5.6)

## 2020-07-08 ENCOUNTER — Telehealth: Payer: Self-pay | Admitting: *Deleted

## 2020-07-08 NOTE — Telephone Encounter (Signed)
Patient verified DOB Patient is aware of labs being normal and levels returning back to normal. No further concerns at this time.

## 2020-07-08 NOTE — Telephone Encounter (Signed)
-----   Message from Ladell Pier, MD sent at 07/06/2020  5:15 PM EDT ----- Let patient know that she is no longer anemic.  Cholesterol level is normal.  She is no longer in the range for prediabetes.  Thyroid level is normal.

## 2020-07-10 ENCOUNTER — Ambulatory Visit
Admission: RE | Admit: 2020-07-10 | Discharge: 2020-07-10 | Disposition: A | Discharge status: Home / Self Care | Payer: Medicare Other | Source: Ambulatory Visit | Attending: Internal Medicine | Admitting: Internal Medicine

## 2020-07-10 ENCOUNTER — Other Ambulatory Visit: Payer: Self-pay

## 2020-07-10 DIAGNOSIS — Z1231 Encounter for screening mammogram for malignant neoplasm of breast: Secondary | ICD-10-CM | POA: Diagnosis not present

## 2020-07-12 ENCOUNTER — Telehealth: Payer: Self-pay

## 2020-07-12 NOTE — Telephone Encounter (Signed)
Contacted pt to go over mm results pt didn't answer and was unable to lvm

## 2020-08-01 ENCOUNTER — Telehealth: Payer: Self-pay | Admitting: Internal Medicine

## 2020-08-01 NOTE — Telephone Encounter (Signed)
Pt is calling back because she received a message to reschedule her appt with [facility]. Please advise [phone]

## 2020-08-01 NOTE — Telephone Encounter (Signed)
I return Pt call to inform that the appt for 08/05/20 is cancel she need to have a Medicare Wellness visit with the pcp

## 2020-08-02 ENCOUNTER — Telehealth: Payer: Self-pay

## 2020-08-02 NOTE — Telephone Encounter (Signed)
Contacted pt to go over MM results pt is aware and doesn't have any questions or concerns

## 2020-08-05 ENCOUNTER — Ambulatory Visit: Payer: Medicare Other | Admitting: Pharmacist

## 2020-09-23 ENCOUNTER — Ambulatory Visit: Payer: Medicare Other | Admitting: Podiatry

## 2020-10-15 ENCOUNTER — Other Ambulatory Visit: Payer: Self-pay | Admitting: Internal Medicine

## 2020-10-15 DIAGNOSIS — I10 Essential (primary) hypertension: Secondary | ICD-10-CM

## 2020-10-15 NOTE — Telephone Encounter (Signed)
Notes to clinic:  Patient requesting a 90 day supply Patient has upcoming appt on 11/04/2020   Requested Prescriptions  Pending Prescriptions Disp Refills   hydrochlorothiazide (HYDRODIURIL) 12.5 MG tablet [Pharmacy Med Name: HYDROCHLOROTHIAZIDE 12.5MG  TABLETS] 90 tablet 0    Sig: TAKE 1 TABLET(12.5 MG) BY MOUTH DAILY      Cardiovascular: Diuretics - Thiazide Failed - 10/15/2020  3:24 AM      Failed - Ca in normal range and within 360 days    Calcium  Date Value Ref Range Status  03/20/2020 8.7 (L) 8.9 - 10.3 mg/dL Final   Calcium, Ion  Date Value Ref Range Status  07/05/2008 1.21 1.12 - 1.32 mmol/L Final          Failed - Last BP in normal range    BP Readings from Last 1 Encounters:  07/04/20 (!) 142/90          Passed - Cr in normal range and within 360 days    Creatinine  Date Value Ref Range Status  09/01/2019 166.7 20.0 - 300.0 mg/dL Final   Creat  Date Value Ref Range Status  05/02/2015 0.90 0.50 - 1.05 mg/dL Final   Creatinine, Ser  Date Value Ref Range Status  03/20/2020 0.84 0.44 - 1.00 mg/dL Final          Passed - K in normal range and within 360 days    Potassium  Date Value Ref Range Status  03/20/2020 4.0 3.5 - 5.1 mmol/L Final          Passed - Na in normal range and within 360 days    Sodium  Date Value Ref Range Status  03/20/2020 141 135 - 145 mmol/L Final  09/01/2019 145 (H) 134 - 144 mmol/L Final          Passed - Valid encounter within last 6 months    Recent Outpatient Visits           3 months ago Essential hypertension   [place], Deborah B, MD   7 months ago Essential hypertension   [place], Deborah B, MD   1 year ago Essential hypertension   [place], Deborah B, MD   1 year ago Essential hypertension   [place], Deborah B, MD   1 year ago Essential  hypertension   [place], Annie Main L, RPH-CPP                  lisinopril (ZESTRIL) 40 MG tablet [Pharmacy Med Name: LISINOPRIL 40MG  TABLETS] 90 tablet 0    Sig: TAKE 1 TABLET(40 MG) BY MOUTH DAILY      Cardiovascular:  ACE Inhibitors Failed - 10/15/2020  3:24 AM      Failed - Cr in normal range and within 180 days    Creatinine  Date Value Ref Range Status  09/01/2019 166.7 20.0 - 300.0 mg/dL Final   Creat  Date Value Ref Range Status  05/02/2015 0.90 0.50 - 1.05 mg/dL Final   Creatinine, Ser  Date Value Ref Range Status  03/20/2020 0.84 0.44 - 1.00 mg/dL Final          Failed - K in normal range and within 180 days    Potassium  Date Value Ref Range Status  03/20/2020 4.0 3.5 - 5.1 mmol/L Final  Failed - Last BP in normal range    BP Readings from Last 1 Encounters:  07/04/20 (!) 142/90          Passed - Patient is not pregnant      Passed - Valid encounter within last 6 months    Recent Outpatient Visits           3 months ago Essential hypertension   [place], Deborah B, MD   7 months ago Essential hypertension   [place], Deborah B, MD   1 year ago Essential hypertension   [place], Deborah B, MD   1 year ago Essential hypertension   [place], Deborah B, MD   1 year ago Essential hypertension   [place], Annie Main L, RPH-CPP                  metoprolol succinate (TOPROL-XL) 100 MG 24 hr tablet [Pharmacy Med Name: METOPROLOL ER SUCCINATE 100MG  TABS] 90 tablet 0    Sig: TAKE 1 TABLET BY MOUTH DAILY WITH OR IMMEDIATELY FOLLOWING A MEAL      Cardiovascular:  Beta Blockers Failed - 10/15/2020  3:24 AM      Failed - Last BP in normal range    BP Readings from Last 1 Encounters:  07/04/20 (!)  142/90          Passed - Last Heart Rate in normal range    Pulse Readings from Last 1 Encounters:  07/04/20 65          Passed - Valid encounter within last 6 months    Recent Outpatient Visits           3 months ago Essential hypertension   [place], MD   7 months ago Essential hypertension   [place], Deborah B, MD   1 year ago Essential hypertension   [place], Deborah B, MD   1 year ago Essential hypertension   [place], Deborah B, MD   1 year ago Essential hypertension   [place], RPH-CPP

## 2020-10-16 NOTE — Telephone Encounter (Signed)
Notes to clinic:  Patient requests 90 days supply   Requested Prescriptions  Pending Prescriptions Disp Refills   hydrochlorothiazide (HYDRODIURIL) 12.5 MG tablet [Pharmacy Med Name: HYDROCHLOROTHIAZIDE 12.5MG  TABLETS] 90 tablet     Sig: TAKE 1 TABLET(12.5 MG) BY MOUTH DAILY      Cardiovascular: Diuretics - Thiazide Failed - 10/15/2020  5:18 PM      Failed - Ca in normal range and within 360 days    Calcium  Date Value Ref Range Status  03/20/2020 8.7 (L) 8.9 - 10.3 mg/dL Final   Calcium, Ion  Date Value Ref Range Status  07/05/2008 1.21 1.12 - 1.32 mmol/L Final          Failed - Last BP in normal range    BP Readings from Last 1 Encounters:  07/04/20 (!) 142/90          Passed - Cr in normal range and within 360 days    Creatinine  Date Value Ref Range Status  09/01/2019 166.7 20.0 - 300.0 mg/dL Final   Creat  Date Value Ref Range Status  05/02/2015 0.90 0.50 - 1.05 mg/dL Final   Creatinine, Ser  Date Value Ref Range Status  03/20/2020 0.84 0.44 - 1.00 mg/dL Final          Passed - K in normal range and within 360 days    Potassium  Date Value Ref Range Status  03/20/2020 4.0 3.5 - 5.1 mmol/L Final          Passed - Na in normal range and within 360 days    Sodium  Date Value Ref Range Status  03/20/2020 141 135 - 145 mmol/L Final  09/01/2019 145 (H) 134 - 144 mmol/L Final          Passed - Valid encounter within last 6 months    Recent Outpatient Visits           3 months ago Essential hypertension   [place], Deborah B, MD   7 months ago Essential hypertension   [place], Deborah B, MD   1 year ago Essential hypertension   [place], Deborah B, MD   1 year ago Essential hypertension   [place], Deborah B, MD   1 year ago Essential hypertension   [place], Annie Main L, RPH-CPP                  lisinopril (ZESTRIL) 40 MG tablet [Pharmacy Med Name: LISINOPRIL 40MG  TABLETS] 90 tablet     Sig: TAKE 1 TABLET(40 MG) BY MOUTH DAILY      Cardiovascular:  ACE Inhibitors Failed - 10/15/2020  5:18 PM      Failed - Cr in normal range and within 180 days    Creatinine  Date Value Ref Range Status  09/01/2019 166.7 20.0 - 300.0 mg/dL Final   Creat  Date Value Ref Range Status  05/02/2015 0.90 0.50 - 1.05 mg/dL Final   Creatinine, Ser  Date Value Ref Range Status  03/20/2020 0.84 0.44 - 1.00 mg/dL Final          Failed - K in normal range and within 180 days    Potassium  Date Value Ref Range Status  03/20/2020 4.0 3.5 - 5.1 mmol/L Final          Failed - Last BP  in normal range    BP Readings from Last 1 Encounters:  07/04/20 (!) 142/90          Passed - Patient is not pregnant      Passed - Valid encounter within last 6 months    Recent Outpatient Visits           3 months ago Essential hypertension   [place], Deborah B, MD   7 months ago Essential hypertension   [place], Deborah B, MD   1 year ago Essential hypertension   [place], Deborah B, MD   1 year ago Essential hypertension   [place], Deborah B, MD   1 year ago Essential hypertension   [place], Annie Main L, RPH-CPP                  metoprolol succinate (TOPROL-XL) 100 MG 24 hr tablet [Pharmacy Med Name: METOPROLOL ER SUCCINATE 100MG  TABS] 90 tablet     Sig: TAKE 1 TABLET BY MOUTH DAILY WITH OR IMMEDIATELY FOLLOWING A MEAL      Cardiovascular:  Beta Blockers Failed - 10/15/2020  5:18 PM      Failed - Last BP in normal range    BP Readings from Last 1 Encounters:  07/04/20 (!) 142/90          Passed - Last Heart Rate in  normal range    Pulse Readings from Last 1 Encounters:  07/04/20 65          Passed - Valid encounter within last 6 months    Recent Outpatient Visits           3 months ago Essential hypertension   [place], MD   7 months ago Essential hypertension   [place], Deborah B, MD   1 year ago Essential hypertension   [place], Deborah B, MD   1 year ago Essential hypertension   [place], Deborah B, MD   1 year ago Essential hypertension   [place], RPH-CPP

## 2020-10-19 ENCOUNTER — Other Ambulatory Visit: Payer: Self-pay | Admitting: Internal Medicine

## 2020-10-19 DIAGNOSIS — I10 Essential (primary) hypertension: Secondary | ICD-10-CM

## 2020-10-19 NOTE — Telephone Encounter (Signed)
Discontinued 07/04/20 by PCP dose increased to 10 mg

## 2020-11-01 ENCOUNTER — Telehealth: Payer: Self-pay

## 2020-11-01 NOTE — Telephone Encounter (Signed)
Tried contacting pt to reschedule Medicare Wellness. Pt didn't answer and was unable to lvm. If pt calls back please reschedule appt

## 2020-11-04 ENCOUNTER — Encounter: Payer: Medicare Other | Admitting: Internal Medicine

## 2020-11-08 DIAGNOSIS — H0288A Meibomian gland dysfunction right eye, upper and lower eyelids: Secondary | ICD-10-CM | POA: Diagnosis not present

## 2020-11-08 DIAGNOSIS — H2513 Age-related nuclear cataract, bilateral: Secondary | ICD-10-CM | POA: Diagnosis not present

## 2020-11-08 DIAGNOSIS — H35033 Hypertensive retinopathy, bilateral: Secondary | ICD-10-CM | POA: Diagnosis not present

## 2020-11-08 DIAGNOSIS — H18513 Endothelial corneal dystrophy, bilateral: Secondary | ICD-10-CM | POA: Diagnosis not present

## 2020-11-08 DIAGNOSIS — H5203 Hypermetropia, bilateral: Secondary | ICD-10-CM | POA: Diagnosis not present

## 2020-11-08 DIAGNOSIS — H524 Presbyopia: Secondary | ICD-10-CM | POA: Diagnosis not present

## 2020-11-08 DIAGNOSIS — H0288B Meibomian gland dysfunction left eye, upper and lower eyelids: Secondary | ICD-10-CM | POA: Diagnosis not present

## 2020-11-08 DIAGNOSIS — H52223 Regular astigmatism, bilateral: Secondary | ICD-10-CM | POA: Diagnosis not present

## 2020-11-13 ENCOUNTER — Other Ambulatory Visit: Payer: Self-pay

## 2020-11-13 ENCOUNTER — Encounter: Payer: Self-pay | Admitting: Podiatry

## 2020-11-13 ENCOUNTER — Ambulatory Visit (INDEPENDENT_AMBULATORY_CARE_PROVIDER_SITE_OTHER): Payer: Medicare Other | Admitting: Podiatry

## 2020-11-13 DIAGNOSIS — N289 Disorder of kidney and ureter, unspecified: Secondary | ICD-10-CM

## 2020-11-13 DIAGNOSIS — B351 Tinea unguium: Secondary | ICD-10-CM | POA: Diagnosis not present

## 2020-11-13 DIAGNOSIS — M79674 Pain in right toe(s): Secondary | ICD-10-CM

## 2020-11-13 DIAGNOSIS — M79675 Pain in left toe(s): Secondary | ICD-10-CM

## 2020-11-13 NOTE — Progress Notes (Signed)
This patient returns to my office for at risk foot care.  This patient requires this care by a professional since this patient will be at risk due to having renal insufficiency. This patient is unable to cut nails herself since the patient cannot reach her nails.These nails are painful walking and wearing shoes.  This patient presents for at risk foot care today.  General Appearance  Alert, conversant and in no acute stress.  Vascular  Dorsalis pedal pulses are palpable  bilaterally. Posterior tibial pulses are weakly palpable bilateral. Capillary return is within normal limits  bilaterally. Temperature is within normal limits  bilaterally.  Neurologic  Senn-Weinstein monofilament wire test diminished  bilaterally. Muscle power within normal limits bilaterally.  Nails Thick disfigured discolored nails with subungual debris  from hallux to fifth toes bilaterally. No evidence of bacterial infection or drainage bilaterally.  Orthopedic  No limitations of motion  feet .  No crepitus or effusions noted.  No bony pathology or digital deformities noted.HAV  B/L.  Skin  normotropic skin with no porokeratosis noted bilaterally.  No signs of infections or ulcers noted.     Onychomycosis  Pain in right toes  Pain in left toes  Consent was obtained for treatment procedures.  IE   Mechanical debridement of nails 1-5  bilaterally performed with a nail nipper.  Filed with dremel without incident.    Return office visit    4 months                  Told patient to return for periodic foot care and evaluation due to potential at risk complications.   Gardiner Barefoot DPM

## 2021-02-24 ENCOUNTER — Ambulatory Visit: Payer: Medicare Other | Admitting: Podiatry

## 2021-03-14 ENCOUNTER — Encounter: Payer: Self-pay | Admitting: Internal Medicine

## 2021-03-14 ENCOUNTER — Ambulatory Visit: Payer: Medicare Other | Attending: Internal Medicine | Admitting: Internal Medicine

## 2021-03-14 ENCOUNTER — Other Ambulatory Visit: Payer: Self-pay

## 2021-03-14 VITALS — BP 176/80 | HR 80 | Wt 162.4 lb

## 2021-03-14 DIAGNOSIS — Z7189 Other specified counseling: Secondary | ICD-10-CM

## 2021-03-14 DIAGNOSIS — R269 Unspecified abnormalities of gait and mobility: Secondary | ICD-10-CM

## 2021-03-14 DIAGNOSIS — I10 Essential (primary) hypertension: Secondary | ICD-10-CM

## 2021-03-14 DIAGNOSIS — Z23 Encounter for immunization: Secondary | ICD-10-CM

## 2021-03-14 DIAGNOSIS — E663 Overweight: Secondary | ICD-10-CM

## 2021-03-14 DIAGNOSIS — Z6829 Body mass index (BMI) 29.0-29.9, adult: Secondary | ICD-10-CM | POA: Diagnosis not present

## 2021-03-14 DIAGNOSIS — Z0001 Encounter for general adult medical examination with abnormal findings: Secondary | ICD-10-CM

## 2021-03-14 DIAGNOSIS — Z0000 Encounter for general adult medical examination without abnormal findings: Secondary | ICD-10-CM

## 2021-03-14 MED ORDER — HYDROCHLOROTHIAZIDE 12.5 MG PO TABS: 12.5000 mg | ORAL_TABLET | Freq: Every day | ORAL | 1 refills | Status: DC

## 2021-03-14 MED ORDER — METOPROLOL SUCCINATE ER 100 MG PO TB24: ORAL_TABLET | ORAL | 1 refills | Status: DC

## 2021-03-14 MED ORDER — ZOSTER VAC RECOMB ADJUVANTED 50 MCG/0.5ML IM SUSR: 0.5000 mL | Freq: Once | INTRAMUSCULAR | 0 refills | Status: AC

## 2021-03-14 MED ORDER — AMLODIPINE BESYLATE 10 MG PO TABS: 10.0000 mg | ORAL_TABLET | Freq: Every day | ORAL | 1 refills | Status: DC

## 2021-03-14 MED ORDER — LISINOPRIL 40 MG PO TABS: 40.0000 mg | ORAL_TABLET | Freq: Every day | ORAL | 1 refills | Status: DC

## 2021-03-14 NOTE — Patient Instructions (Addendum)
Please read over the material that I gave you on the advanced directive.  Should you decide to exercise a living will or healthcare power of attorney, please keep a copy for your record and bring [place] a copy on your next visit.  I have given you the prescription to get the shingles vaccine.  You should wait at least 1 week after receiving the flu shot and pneumonia shot before getting the shingles vaccine.  Pressure is elevated.  Please refill your medications.  I have sent them to your pharmacy today.

## 2021-03-14 NOTE — Progress Notes (Signed)
Subjective:   Shannon [name] is a 65 y.o. female who presents for an Initial Medicare Annual Wellness Visit. Patient with history of HTN, pre-DM, chronic lower back pain ( hx of L4-5 decompression surgery), HL, ETOH use disorder, OA knees, vit D def.  Review of Systems    Psychiatry:  her BF of 30 yrs died 2021-01-26 due to cirrhosis and leukemia.  Son is staying with her for support CVS:  out of all 4 BP meds x 1 wk. No CP/SOB/HA/Dizziness     Objective:    Today's Vitals   03/14/21 1049  BP: (!) 176/80  Pulse: 80  SpO2: 97%  Weight: 162 lb 6.4 oz (73.7 kg)  PainSc: 9    Body mass index is 29.7 kg/m. Wt Readings from Last 3 Encounters:  03/14/21 162 lb 6.4 oz (73.7 kg)  07/04/20 160 lb 3.2 oz (72.7 kg)  03/19/20 168 lb (76.2 kg)    Advanced Directives 03/19/2020 03/12/2020 04/03/2018 04/01/2018 01/15/2017 10/15/2016 09/07/2016  Does Patient Have a Medical Advance Directive? No No No No No No No  Would patient like information on creating a medical advance directive? No - Patient declined No - Patient declined No - Patient declined No - Patient declined No - Patient declined - No - Patient declined  Pre-existing out of facility DNR order (yellow form or pink MOST form) - - - - - - -  Screening initially done by my CMA.  However when I asked the patient about an advanced directive, she does not know what an advanced directive is so I discussed with her advanced directives including living will and healthcare power of attorney.  She wanted to know more about it so she was given a packet to take home to look over.  Current Medications (verified) Outpatient Encounter Medications as of 03/14/2021  Medication Sig   cyclobenzaprine (FLEXERIL) 5 MG tablet Take by mouth.   fenofibrate (TRICOR) 48 MG tablet Take by mouth.   fluticasone (FLONASE) 50 MCG/ACT nasal spray SHAKE LIQUID AND USE 2 SPRAYS IN EACH NOSTRIL DAILY   HYDROcodone-acetaminophen (NORCO) 10-325 MG tablet Take by mouth.    meloxicam (MOBIC) 15 MG tablet Take by mouth.   pregabalin (LYRICA) 50 MG capsule Take by mouth.   Zoster Vaccine Adjuvanted [facility]) injection Inject 0.5 mLs into the muscle once for 1 dose.   [DISCONTINUED] amLODipine (NORVASC) 10 MG tablet Take 1 tablet (10 mg total) by mouth daily.   [DISCONTINUED] hydrochlorothiazide (HYDRODIURIL) 12.5 MG tablet TAKE 1 TABLET(12.5 MG) BY MOUTH DAILY   [DISCONTINUED] lisinopril (ZESTRIL) 40 MG tablet TAKE 1 TABLET(40 MG) BY MOUTH DAILY   [DISCONTINUED] metoprolol succinate (TOPROL-XL) 100 MG 24 hr tablet TAKE 1 TABLET BY MOUTH DAILY WITH OR IMMEDIATELY FOLLOWING A MEAL   amLODipine (NORVASC) 10 MG tablet Take 1 tablet (10 mg total) by mouth daily.   gabapentin (NEURONTIN) 300 MG capsule Take 1 capsule (300 mg total) by mouth 3 (three) times daily. (Patient not taking: Reported on 03/14/2021)   hydrochlorothiazide (HYDRODIURIL) 12.5 MG tablet Take 1 tablet (12.5 mg total) by mouth daily.   lisinopril (ZESTRIL) 40 MG tablet Take 1 tablet (40 mg total) by mouth daily.   methocarbamol (ROBAXIN) 500 MG tablet TAKE 1 TABLET (500 MG TOTAL) BY MOUTH EVERY SIX HOURS AS NEEDED FOR MUSCLE SPASMS. (Patient not taking: Reported on 07/04/2020)   metoprolol succinate (TOPROL-XL) 100 MG 24 hr tablet Take with or immediately following a meal.   No facility-administered encounter medications on  file as of 03/14/2021.    Allergies (verified) Morphine and related   History: Past Medical History:  Diagnosis Date   Arthritis    Cataract 12/2017   Dr. Schuyler Amor   Dizziness    occasionally   Headache    Hyperlipidemia    takes Tricor daily   Hypertension    takes Lisinopril and Metoprolol daily   Hypertensive retinopathy 12/2017   Dr. Schuyler Amor   Numbness    stinging in right hand   Peripheral edema    takes lasix daily   Pneumonia    Ventral hernia    Past Surgical History:  Procedure Laterality Date   ABDOMINAL HYSTERECTOMY     BACK SURGERY  09/01/2010   L4-5  lumbar decompression including laminotomy, facetectomy,     colonscopy     FEMUR IM NAIL Left 09/05/2016   FEMUR IM NAIL Left 09/05/2016   Procedure: INTRAMEDULLARY (IM) NAIL FEMORAL;  Surgeon: Nicholes Stairs, MD;  Location: [facility];  Service: Orthopedics;  Laterality: Left;   FRACTURE SURGERY     left leg   INSERTION OF MESH N/A 05/24/2013   Procedure: INSERTION OF MESH;  Surgeon: Imogene Burn. Georgette Dover, MD;  Location: [facility];  Service: General;  Laterality: N/A;   TRANSFORAMINAL LUMBAR INTERBODY FUSION W/ MIS 1 LEVEL N/A 03/19/2020   Procedure: MINIMALLY INVASIVE TRANSFORAMINAL INTERBODY FUSION LUMBAR THREE- LUMBAR FOUR WITH REVISION OF HARDWARE LUMBAR FOUR - LUMBAR FIVE;  Surgeon: Karsten Ro, DO;  Location: [facility];  Service: Neurosurgery;  Laterality: N/A;  MINIMALLY INVASIVE TRANSFORAMINAL INTERBODY FUSION LUMBAR THREE- LUMBAR FOUR WITH REVISION OF HARDWARE LUMBAR FOUR - LUMBAR FIVE   UTERINE FIBROID SURGERY     VENTRAL HERNIA REPAIR N/A 05/24/2013   Procedure: OPEN REPAIR EPIGASTRIC VENTRAL HERNIA;  Surgeon: Imogene Burn. Georgette Dover, MD;  Location: [facility];  Service: General;  Laterality: N/A;   Family History  Problem Relation Age of Onset   Stroke Mother    Breast cancer Sister    Social History   Socioeconomic History   Marital status: Legally Separated    Spouse name: Not on file   Number of children: Not on file   Years of education: Not on file   Highest education level: Not on file  Occupational History   Not on file  Tobacco Use   Smoking status: Former   Smokeless tobacco: Never  Vaping Use   Vaping Use: Never used  Substance and Sexual Activity   Alcohol use: Yes    Alcohol/week: 7.0 standard drinks    Types: 7 Cans of beer per week    Comment: 1 can of beer/day   Drug use: No   Sexual activity: Yes    Birth control/protection: Surgical  Other Topics Concern   Not on file  Social History Narrative   ** Merged History Encounter **       Social Determinants of Health    Financial Resource Strain: Not on file  Food Insecurity: Not on file  Transportation Needs: Not on file  Physical Activity: Not on file  Stress: Not on file  Social Connections: Not on file    Tobacco Counseling nonsmoker.  Quit about 18 years ago.  Clinical Intake:  Pre-visit preparation completed: Yes  Pain : 0-10 Pain Score: 9  Pain Type: Acute pain Pain Location: Back Pain Radiating Towards: radiating to right  leg Pain Descriptors / Indicators: Aching, Sharp Pain Onset: Other (comment) (5 months) Pain Frequency: Occasional Pain Relieving Factors: hydrocodone  Pain Relieving Factors: hydrocodone  Diabetes: No  How often do you need to have someone help you when you read instructions, pamphlets, or other written materials from your doctor or pharmacy?: 1 - Never  Diabetic?no.  She is preDM.   Does not over eat. Eats fruits and veggies.  Drinks water, milk, sweet tea  Interpreter Needed?: No   Activities of Daily Living In your present state of health, do you have any difficulty performing the following activities: 03/14/2021  Hearing? N  Vision? N  Difficulty concentrating or making decisions? Y  Walking or climbing stairs? Y  Comment walking  Dressing or bathing? N  Doing errands, shopping? N  Preparing Food and eating ? N  Using the Toilet? N  In the past six months, have you accidently leaked urine? N  Do you have problems with loss of bowel control? N  Managing your Medications? Y  Managing your Finances? Y  Housekeeping or managing your Housekeeping? Y  Some recent data might be hidden  Difficulty climbing stairs because of arthritis in her knees.   No problems managing meds. Does not have pill box Denies problems managing finances Son helps with house keeping.  He lives with her.  Patient Care Team: Ladell Pier, MD as PCP - General (Internal Medicine) Jovita Gamma, MD as Consulting Physician (Neurosurgery)  Indicate any recent  Medical Services you may have received from other than [facility] providers in the past year (date may be approximate).     Assessment:   This is a routine wellness examination for [place].  Hearing/Vision screen Whisper test was normal. Screening vision exam not done as patient reports having had a routine eye exam done about 4 months ago and has no prescription reading glasses.  Dietary issues and exercise activities discussed: Current Exercise Habits: Home exercise routine, Type of exercise: walking, Time (Minutes): > 60, Frequency (Times/Week): 3, Weekly Exercise (Minutes/Week): 0 Does stretching exercises and chair exercises as was shown by home P.T.  Walks 3 times a day in her neighborhood using her walker   Goals Addressed   None   Depression Screen PHQ 2/9 Scores 03/14/2021 07/04/2020 09/01/2019 12/23/2018 05/12/2018 03/15/2018 11/12/2017  PHQ - 2 Score 3 3 5 2 3 3 3   PHQ- 9 Score 8 11 8 9 12 12 7   Exception Documentation - - - - - - -   Pt does not feel that depression is a major issue for her  Fall Risk Fall Risk  03/14/2021 07/04/2020 02/22/2020 09/01/2019 03/24/2019  Falls in the past year? 0 0 1 0 0  Comment - - - no falls in the past 30 days -  Number falls in past yr: - 0 1 - -  Injury with Fall? - 0 0 - -  Risk for fall due to : - - - - -  Follow up - - Falls evaluation completed;Education provided;Falls prevention discussed - -    FALL RISK PREVENTION PERTAINING TO THE HOME:  Any stairs in or around the home? No  If so, are there any without handrails?  NA Home free of loose throw rugs in walkways, pet beds, electrical cords, etc? Yes  Adequate lighting in your home to reduce risk of falls? Yes   ASSISTIVE DEVICES UTILIZED TO PREVENT FALLS:  Life alert? No  Use of a cane, walker or w/c? Yes  Grab bars in the bathroom? Yes  Shower chair or bench in shower? Yes  Elevated toilet seat or a handicapped toilet?  Yes   TIMED UP AND GO:  Was the test performed? Yes .  Length  of time to ambulate 10 feet: 20 sec.   Gait slow and steady with assistive device  Cognitive Function:        Immunizations Immunization History  Administered Date(s) Administered   Influenza Whole 04/26/2009, 01/21/2010   Influenza,inj,Quad PF,6+ Mos 02/16/2014, 01/03/2015, 02/11/2016, 01/15/2017, 12/23/2018, 07/04/2020, 03/14/2021   PNEUMOCOCCAL CONJUGATE-20 03/14/2021   Td 11/17/2007   Tdap 05/12/2018    TDAP status: Up to date  Flu Vaccine status: Completed at today's visit  Pneumococcal vaccine status: Completed during today's visit.  Covid-19 vaccine status: Information provided on how to obtain vaccines.  Had 2 shots of [place]  Qualifies for Shingles Vaccine? Yes   Zostavax completed No   Shingrix Completed?: No.    Education has been provided regarding the importance of this vaccine. Patient has been advised to call insurance company to determine out of pocket expense if they have not yet received this vaccine. Advised may also receive vaccine at local pharmacy or Health Dept. Verbalized acceptance and understanding.  Screening Tests Health Maintenance  Topic Date Due   COVID-19 Vaccine (1) Never done   Zoster Vaccines- Shingrix (1 of 2) Never done   MAMMOGRAM  07/11/2022   COLONOSCOPY (Pts 45-39yrs Insurance coverage will need to be confirmed)  10/04/2024   TETANUS/TDAP  05/12/2028   Pneumococcal Vaccine 48-93 Years old  Completed   INFLUENZA VACCINE  Completed   Hepatitis C Screening  Completed   HIV Screening  Completed   HPV VACCINES  Aged Out    Health Maintenance  Health Maintenance Due  Topic Date Due   COVID-19 Vaccine (1) Never done   Zoster Vaccines- Shingrix (1 of 2) Never done    Colorectal cancer screening: Type of screening: Colonoscopy. Completed 09/2019. Repeat every 5 years  Mammogram status: Completed 06/2020. Repeat every year  Lung Cancer Screening: (Low Dose CT Chest recommended if Age 41-80 years, 30 pack-year currently  smoking OR have quit w/in 15years.) does not qualify.   Lung Cancer Screening Referral: N/A  Additional Screening:  Hepatitis C Screening: does qualify; Completed 07/2009  Vision Screening: Recommended annual ophthalmology exams for early detection of glaucoma and other disorders of the eye. Is the patient up to date with their annual eye exam?  Yes  Who is the provider or what is the name of the office in which the patient attends annual eye exams? [facility] - exam about 4 mths ago If pt is not established with a provider, would they like to be referred to a provider to establish care?  NA .   Dental Screening: Recommended annual dental exams for proper oral hygiene Does not have dentist  Community Resource Referral / Chronic Care Management: CRR required this visit?  No   CCM required this visit?  No      Plan:   1. Encounter for Medicare annual wellness exam   2. Advance directive discussed with patient See discussion above.  Patient given a packet to take home to review.  Advised that should she execute a living will or healthcare power of attorney, she should bring a copy for our records on her next visit.  3. Essential hypertension Not at goal.  Patient has been out of her medications.  Refills sent with 90-day supply - hydrochlorothiazide (HYDRODIURIL) 12.5 MG tablet; Take 1 tablet (12.5 mg total) by mouth daily.  Dispense: 90 tablet; Refill: 1 -  lisinopril (ZESTRIL) 40 MG tablet; Take 1 tablet (40 mg total) by mouth daily.  Dispense: 90 tablet; Refill: 1 - metoprolol succinate (TOPROL-XL) 100 MG 24 hr tablet; Take with or immediately following a meal.  Dispense: 90 tablet; Refill: 1 - amLODipine (NORVASC) 10 MG tablet; Take 1 tablet (10 mg total) by mouth daily.  Dispense: 90 tablet; Refill: 1 - Basic Metabolic Panel  4. Over weight Healthy eating habits discussed and encouraged.  Encouraged her to eliminate the sugary drinks from her diet and to drink more water,  continue to incorporate fresh fruits and vegetables into the diet daily, eat more lean white meat like chicken [place] and seafood instead of beef or pork  5. Gait disturbance Her get up and go test was more than 12 seconds.  She ambulated with a cane and gait was slowed but steady.  I asked whether she feel she would like to do some physical therapy at this time.  She declines outpatient physical therapy.  She has not had any recent falls.  6. Need for immunization against influenza - Flu Vaccine QUAD 69mo+IM (Fluarix, Fluzone & Alfiuria Quad PF)  7. Need for vaccination against Streptococcus pneumoniae - Pneumococcal conjugate vaccine 20-valent (Prevnar 20)  8. Need for shingles vaccine -Patient given prescription to take to her outside pharmacy to get her shingles vaccine. - Zoster Vaccine Adjuvanted [facility]) injection; Inject 0.5 mLs into the muscle once for 1 dose.  Dispense: 0.5 mL; Refill: 0   I have personally reviewed and noted the following in the patient's chart:   Medical and social history Use of alcohol, tobacco or illicit drugs  Current medications and supplements including opioid prescriptions. Patient is currently taking opioid prescriptions. Information provided to patient regarding non-opioid alternatives. Patient advised to discuss non-opioid treatment plan with their provider. Functional ability and status Nutritional status Physical activity Advanced directives List of other physicians Hospitalizations, surgeries, and ER visits in previous 12 months Vitals Screenings to include cognitive, depression, and falls Referrals and appointments  In addition, I have reviewed and discussed with patient certain preventive protocols, quality metrics, and best practice recommendations. A written personalized care plan for preventive services as well as general preventive health recommendations were provided to patient.     Karle Plumber, MD   03/14/2021

## 2021-03-15 LAB — BASIC METABOLIC PANEL
BUN/Creatinine Ratio: 11 — ABNORMAL LOW (ref 12–28)
BUN: 10 mg/dL (ref 8–27)
CO2: 23 mmol/L (ref 20–29)
Calcium: 10.2 mg/dL (ref 8.7–10.3)
Chloride: 108 mmol/L — ABNORMAL HIGH (ref 96–106)
Creatinine, Ser: 0.87 mg/dL (ref 0.57–1.00)
Glucose: 103 mg/dL — ABNORMAL HIGH (ref 70–99)
Potassium: 5.2 mmol/L (ref 3.5–5.2)
Sodium: 146 mmol/L — ABNORMAL HIGH (ref 134–144)
eGFR: 75 mL/min/{1.73_m2} (ref >59)

## 2021-03-17 ENCOUNTER — Telehealth: Payer: Self-pay

## 2021-03-17 NOTE — Telephone Encounter (Signed)
Contacted pt to go over lab results pt is aware and doesn't have any questions or concerns 

## 2021-03-24 ENCOUNTER — Ambulatory Visit: Payer: Medicare Other | Admitting: Podiatry

## 2021-04-16 ENCOUNTER — Ambulatory Visit (INDEPENDENT_AMBULATORY_CARE_PROVIDER_SITE_OTHER): Payer: Medicaid Other | Admitting: Podiatry

## 2021-04-16 ENCOUNTER — Encounter: Payer: Self-pay | Admitting: Podiatry

## 2021-04-16 DIAGNOSIS — N289 Disorder of kidney and ureter, unspecified: Secondary | ICD-10-CM

## 2021-04-16 DIAGNOSIS — M79674 Pain in right toe(s): Secondary | ICD-10-CM | POA: Diagnosis not present

## 2021-04-16 DIAGNOSIS — M79675 Pain in left toe(s): Secondary | ICD-10-CM | POA: Diagnosis not present

## 2021-04-16 DIAGNOSIS — B351 Tinea unguium: Secondary | ICD-10-CM | POA: Diagnosis not present

## 2021-04-16 NOTE — Progress Notes (Signed)
This patient returns to my office for at risk foot care.  This patient requires this care by a professional since this patient will be at risk due to having renal insufficiency. This patient is unable to cut nails herself since the patient cannot reach her nails.These nails are painful walking and wearing shoes.  This patient presents for at risk foot care today.  General Appearance  Alert, conversant and in no acute stress.  Vascular  Dorsalis pedal pulses are palpable  bilaterally. Posterior tibial pulses are weakly palpable bilateral. Capillary return is within normal limits  bilaterally. Temperature is within normal limits  bilaterally.  Neurologic  Senn-Weinstein monofilament wire test diminished  bilaterally. Muscle power within normal limits bilaterally.  Nails Thick disfigured discolored nails with subungual debris  from hallux to fifth toes bilaterally. No evidence of bacterial infection or drainage bilaterally.  Orthopedic  No limitations of motion  feet .  No crepitus or effusions noted.  No bony pathology or digital deformities noted.HAV  B/L.  Skin  normotropic skin with no porokeratosis noted bilaterally.  No signs of infections or ulcers noted.     Onychomycosis  Pain in right toes  Pain in left toes  Consent was obtained for treatment procedures.   Mechanical debridement of nails 1-5  bilaterally performed with a nail nipper.  Filed with dremel without incident.    Return office visit    3  months                  Told patient to return for periodic foot care and evaluation due to potential at risk complications.   Yvonnia Tango DPM   

## 2021-05-07 ENCOUNTER — Telehealth: Payer: Self-pay | Admitting: Internal Medicine

## 2021-05-07 NOTE — Telephone Encounter (Signed)
Copied from [place] [phone]. Topic: Quick Communication - Rx Refill/Question >> May 07, 2021  1:20 PM [place], Brayton Layman A wrote: Pharmacist was calling in for more information regarding metoprolol succinate (TOPROL-XL) 100 MG 24 hr tablet, caller needed to know if its 1 tablet daily, please advise.

## 2021-05-08 NOTE — Telephone Encounter (Signed)
Call placed to pharmacy. Metoprolol succinate sig should read: TAKE 1 TABLET BY MOUTH DAILY WITH OR IMMEDIATELY FOLLOWING A MEAL. Spoke to the pharmacist at [facility] and gave verbal authorization to addend the rx sig to include these instructions.

## 2021-05-30 ENCOUNTER — Other Ambulatory Visit: Payer: Self-pay | Admitting: Internal Medicine

## 2021-05-30 DIAGNOSIS — Z1231 Encounter for screening mammogram for malignant neoplasm of breast: Secondary | ICD-10-CM

## 2021-06-16 ENCOUNTER — Ambulatory Visit: Payer: Medicare Other | Admitting: Internal Medicine

## 2021-07-11 ENCOUNTER — Ambulatory Visit: Payer: Medicare Other

## 2021-07-16 ENCOUNTER — Ambulatory Visit: Payer: Medicaid Other | Admitting: Podiatry

## 2021-07-22 ENCOUNTER — Telehealth: Payer: Self-pay | Admitting: Pharmacist

## 2021-07-22 NOTE — Patient Outreach (Signed)
Patient appearing on report for [facility] - Hypertension Control report due to last documented ambulatory blood pressure of 176/80 on 03/14/2021. Next appointment with PCP is not scheduled  ? ?Outreached patient to discuss hypertension control and medication management. Unable to leave voicemail on cell phone or home phone. Unable to leave voicemail on emergency contact's phone either.  ? ?Catie Hedwig Morton, PharmD, BCACP ?[place] ?[phone] ? ? ? ?

## 2021-07-31 ENCOUNTER — Ambulatory Visit
Admission: RE | Admit: 2021-07-31 | Discharge: 2021-07-31 | Disposition: A | Discharge status: Home / Self Care | Payer: Medicare Other | Source: Ambulatory Visit | Attending: Internal Medicine | Admitting: Internal Medicine

## 2021-07-31 DIAGNOSIS — Z1231 Encounter for screening mammogram for malignant neoplasm of breast: Secondary | ICD-10-CM | POA: Diagnosis not present

## 2021-08-01 ENCOUNTER — Other Ambulatory Visit: Payer: Self-pay | Admitting: Internal Medicine

## 2021-08-01 DIAGNOSIS — R928 Other abnormal and inconclusive findings on diagnostic imaging of breast: Secondary | ICD-10-CM

## 2021-08-01 NOTE — Telephone Encounter (Signed)
Attempted to reach patient again, unable to leave voicemail ?

## 2021-08-11 ENCOUNTER — Ambulatory Visit: Payer: Medicaid Other | Admitting: Podiatry

## 2021-08-20 ENCOUNTER — Ambulatory Visit: Payer: Medicare Other | Admitting: Podiatry

## 2021-09-09 ENCOUNTER — Ambulatory Visit: Payer: Medicare Other

## 2021-09-09 ENCOUNTER — Ambulatory Visit
Admission: RE | Admit: 2021-09-09 | Discharge: 2021-09-09 | Disposition: A | Discharge status: Home / Self Care | Payer: Medicare Other | Source: Ambulatory Visit | Attending: Internal Medicine | Admitting: Internal Medicine

## 2021-09-09 DIAGNOSIS — R928 Other abnormal and inconclusive findings on diagnostic imaging of breast: Secondary | ICD-10-CM | POA: Diagnosis not present

## 2021-09-10 ENCOUNTER — Encounter: Payer: Self-pay | Admitting: Podiatry

## 2021-09-10 ENCOUNTER — Ambulatory Visit (INDEPENDENT_AMBULATORY_CARE_PROVIDER_SITE_OTHER): Payer: Medicare Other | Admitting: Podiatry

## 2021-09-10 DIAGNOSIS — B351 Tinea unguium: Secondary | ICD-10-CM

## 2021-09-10 DIAGNOSIS — M79675 Pain in left toe(s): Secondary | ICD-10-CM

## 2021-09-10 DIAGNOSIS — N289 Disorder of kidney and ureter, unspecified: Secondary | ICD-10-CM | POA: Diagnosis not present

## 2021-09-10 DIAGNOSIS — M79674 Pain in right toe(s): Secondary | ICD-10-CM

## 2021-09-10 NOTE — Progress Notes (Signed)
This patient returns to my office for at risk foot care.  This patient requires this care by a professional since this patient will be at risk due to having renal insufficiency. This patient is unable to cut nails herself since the patient cannot reach her nails.These nails are painful walking and wearing shoes.  This patient presents for at risk foot care today.  General Appearance  Alert, conversant and in no acute stress.  Vascular  Dorsalis pedal pulses are palpable  bilaterally. Posterior tibial pulses are weakly palpable bilateral. Capillary return is within normal limits  bilaterally. Temperature is within normal limits  bilaterally.  Neurologic  Senn-Weinstein monofilament wire test diminished  bilaterally. Muscle power within normal limits bilaterally.  Nails Thick disfigured discolored nails with subungual debris  from hallux to fifth toes bilaterally. No evidence of bacterial infection or drainage bilaterally.  Orthopedic  No limitations of motion  feet .  No crepitus or effusions noted.  No bony pathology or digital deformities noted.HAV  B/L.  Skin  normotropic skin with no porokeratosis noted bilaterally.  No signs of infections or ulcers noted.     Onychomycosis  Pain in right toes  Pain in left toes  Consent was obtained for treatment procedures.   Mechanical debridement of nails 1-5  bilaterally performed with a nail nipper.  Filed with dremel without incident.    Return office visit    3  months                  Told patient to return for periodic foot care and evaluation due to potential at risk complications.   Adham Johnson DPM   

## 2021-09-11 ENCOUNTER — Ambulatory Visit (INDEPENDENT_AMBULATORY_CARE_PROVIDER_SITE_OTHER): Payer: Medicare Other

## 2021-09-11 ENCOUNTER — Ambulatory Visit (HOSPITAL_COMMUNITY)
Admission: EM | Admit: 2021-09-11 | Discharge: 2021-09-11 | Disposition: A | Discharge status: Home / Self Care | Payer: Medicare Other | Attending: Internal Medicine | Admitting: Internal Medicine

## 2021-09-11 ENCOUNTER — Encounter (HOSPITAL_COMMUNITY): Payer: Self-pay

## 2021-09-11 DIAGNOSIS — R918 Other nonspecific abnormal finding of lung field: Secondary | ICD-10-CM | POA: Diagnosis not present

## 2021-09-11 DIAGNOSIS — W19XXXA Unspecified fall, initial encounter: Secondary | ICD-10-CM

## 2021-09-11 DIAGNOSIS — R0789 Other chest pain: Secondary | ICD-10-CM

## 2021-09-11 DIAGNOSIS — R0781 Pleurodynia: Secondary | ICD-10-CM

## 2021-09-11 DIAGNOSIS — J9811 Atelectasis: Secondary | ICD-10-CM | POA: Diagnosis not present

## 2021-09-11 MED ORDER — KETOROLAC TROMETHAMINE 30 MG/ML IJ SOLN
30.0000 mg | Freq: Once | INTRAMUSCULAR | Status: AC
  Administered 2021-09-11: 30 mg via INTRAMUSCULAR

## 2021-09-11 MED ORDER — ACETAMINOPHEN 500 MG PO TABS: 1000.0000 mg | ORAL_TABLET | Freq: Four times a day (QID) | ORAL | 0 refills | Status: DC | PRN

## 2021-09-11 MED ORDER — KETOROLAC TROMETHAMINE 30 MG/ML IJ SOLN
INTRAMUSCULAR | Status: AC
  Filled 2021-09-11: qty 1

## 2021-09-11 MED ORDER — IBUPROFEN 600 MG PO TABS: 600.0000 mg | ORAL_TABLET | Freq: Four times a day (QID) | ORAL | 0 refills | Status: AC | PRN

## 2021-09-11 MED ORDER — IBUPROFEN 800 MG PO TABS: 800.0000 mg | ORAL_TABLET | Freq: Once | ORAL | Status: DC

## 2021-09-11 NOTE — ED Triage Notes (Signed)
Pt states tripped and fell yesterday landing on a tote of DVDs. Pt c/o rt rib pain under rt breast. States pain on cough, moving, and breathing.

## 2021-09-11 NOTE — ED Provider Notes (Signed)
[facility]    CSN: [id number] Arrival date & time: 09/11/21  1118      History   Chief Complaint Chief Complaint  Patient presents with   Rib Injury    HPI Shannon [name] is a 66 y.o. female.   Patient is a 66 year old female presenting to urgent care for evaluation of right rib cage pain after she fell 2 days ago onto a DVD case.  She states that the corner of the DVD case is very sharp.  She denies hitting her head and is not on blood thinners.  No loss of consciousness and she remembers the entire event.  Her pain is localized to underneath her right breast and she is very tender to palpation.  Pain is currently a 10 on a scale of 0-10 and she reports difficulty taking a deep breath in.  Pain worsens with cough.  She is a current smoker and states that she has a chronic cough with brown phlegm that is normal for her.  Over the last couple of days, she does report mild chills, especially when she turns the air conditioner on.  Denies shortness of breath, headache, sore throat, neck pain, and pain anywhere else on her body from the fall.  She has not taken any medication for her pain today. No other aggravating or relieving factors for symptoms identified at this time.     Past Medical History:  Diagnosis Date   Arthritis    Cataract 12/2017   Dr. Schuyler Amor   Dizziness    occasionally   Headache    Hyperlipidemia    takes Tricor daily   Hypertension    takes Lisinopril and Metoprolol daily   Hypertensive retinopathy 12/2017   Dr. Schuyler Amor   Numbness    stinging in right hand   Peripheral edema    takes lasix daily   Pneumonia    Ventral hernia     Patient Active Problem List   Diagnosis Date Noted   Pain due to onychomycosis of toenails of both feet 11/13/2020   Cocaine abuse ([place]) 07/04/2020   Unintended weight loss 07/04/2020   Internal hemorrhoids 07/03/2020   Carpal tunnel syndrome 04/24/2020   Lumbar adjacent segment disease with spondylolisthesis  03/19/2020   Recurrent falls 02/22/2020   Neck pain 01/11/2020   DDD (degenerative disc disease), lumbar 04/04/2019   History of lumbar fusion 04/04/2019   Prediabetes 03/24/2019   Vitamin D deficiency 03/24/2019   Influenza vaccine needed 12/23/2018   Renal insufficiency 08/22/2018   Obesity (BMI 30-39.9) 01/15/2017   Alcohol use disorder, mild, abuse 01/15/2017   Closed fracture of left distal femur ([place]) 09/03/2016   Osteoarthritis of left knee 05/02/2015   Postlaminectomy syndrome, lumbar region 11/09/2013   Lumbosacral spondylosis without myelopathy 11/09/2013   Dyslipidemia 10/11/2013   Ventral hernia 05/19/2013   DEPRESSION 06/13/2010   DIVERTICULOSIS, COLON 07/21/2009   SYPHILIS 05/14/2009   DYSHIDROTIC ECZEMA, HANDS 11/06/2008   Chronic low back pain 11/06/2008   FIBROIDS, UTERUS 11/17/2007   Essential hypertension 11/17/2007    Past Surgical History:  Procedure Laterality Date   ABDOMINAL HYSTERECTOMY     BACK SURGERY  09/01/2010   L4-5 lumbar decompression including laminotomy, facetectomy,     colonscopy     FEMUR IM NAIL Left 09/05/2016   FEMUR IM NAIL Left 09/05/2016   Procedure: INTRAMEDULLARY (IM) NAIL FEMORAL;  Surgeon: Nicholes Stairs, MD;  Location: [facility];  Service: Orthopedics;  Laterality: Left;   FRACTURE SURGERY  left leg   INSERTION OF MESH N/A 05/24/2013   Procedure: INSERTION OF MESH;  Surgeon: Imogene Burn. Georgette Dover, MD;  Location: [facility];  Service: General;  Laterality: N/A;   TRANSFORAMINAL LUMBAR INTERBODY FUSION W/ MIS 1 LEVEL N/A 03/19/2020   Procedure: MINIMALLY INVASIVE TRANSFORAMINAL INTERBODY FUSION LUMBAR THREE- LUMBAR FOUR WITH REVISION OF HARDWARE LUMBAR FOUR - LUMBAR FIVE;  Surgeon: Karsten Ro, DO;  Location: [facility];  Service: Neurosurgery;  Laterality: N/A;  MINIMALLY INVASIVE TRANSFORAMINAL INTERBODY FUSION LUMBAR THREE- LUMBAR FOUR WITH REVISION OF HARDWARE LUMBAR FOUR - LUMBAR FIVE   UTERINE FIBROID SURGERY     VENTRAL HERNIA  REPAIR N/A 05/24/2013   Procedure: OPEN REPAIR EPIGASTRIC VENTRAL HERNIA;  Surgeon: Imogene Burn. Georgette Dover, MD;  Location: [facility];  Service: General;  Laterality: N/A;    OB History   No obstetric history on file.      Home Medications    Prior to Admission medications   Medication Sig Start Date End Date Taking? Authorizing Provider  acetaminophen (TYLENOL) 500 MG tablet Take 2 tablets (1,000 mg total) by mouth every 6 (six) hours as needed. 09/11/21  Yes Talbot Grumbling, FNP  ibuprofen (ADVIL) 600 MG tablet Take 1 tablet (600 mg total) by mouth every 6 (six) hours as needed. 09/11/21  Yes Talbot Grumbling, FNP  amLODipine (NORVASC) 10 MG tablet Take 1 tablet (10 mg total) by mouth daily. 03/14/21   Ladell Pier, MD  cyclobenzaprine (FLEXERIL) 5 MG tablet Take by mouth. 03/27/20   [provider]  fenofibrate (TRICOR) 48 MG tablet Take by mouth.    [provider]  fluticasone (FLONASE) 50 MCG/ACT nasal spray SHAKE LIQUID AND USE 2 SPRAYS IN EACH NOSTRIL DAILY 07/04/20   Ladell Pier, MD  gabapentin (NEURONTIN) 300 MG capsule Take 1 capsule (300 mg total) by mouth 3 (three) times daily. Patient not taking: Reported on 03/14/2021 05/20/20   Hilts, Legrand Como, MD  hydrochlorothiazide (HYDRODIURIL) 12.5 MG tablet Take 1 tablet (12.5 mg total) by mouth daily. 03/14/21   Ladell Pier, MD  lisinopril (ZESTRIL) 40 MG tablet Take 1 tablet (40 mg total) by mouth daily. 03/14/21   Ladell Pier, MD  meloxicam (MOBIC) 15 MG tablet Take by mouth.    [provider]  metoprolol succinate (TOPROL-XL) 100 MG 24 hr tablet Take with or immediately following a meal. 03/14/21   Ladell Pier, MD  pregabalin (LYRICA) 50 MG capsule Take by mouth. 01/11/20   [provider]    Family History Family History  Problem Relation Age of Onset   Stroke Mother    Breast cancer Sister     Social History Social History   Tobacco Use   Smoking status: Former    Smokeless tobacco: Never  Scientific laboratory technician Use: Never used  Substance Use Topics   Alcohol use: Yes    Alcohol/week: 7.0 standard drinks    Types: 7 Cans of beer per week    Comment: 1 can of beer/day   Drug use: No     Allergies   Morphine and related   Review of Systems Review of Systems Per HPI  Physical Exam Triage Vital Signs ED Triage Vitals [09/11/21 1216]  Enc Vitals Group     BP (!) 170/114     Pulse Rate 85     Resp 18     Temp 98.6 F (37 C)     Temp Source Oral  SpO2 92 %     Weight      Height      Head Circumference      Peak Flow      Pain Score 10     Pain Loc      Pain Edu?      Excl. in [place]?    No data found.  Updated Vital Signs BP (!) 170/114 (BP Location: Right Arm)   Pulse 85   Temp 98.6 F (37 C) (Oral)   Resp 18   SpO2 92%   Visual Acuity Right Eye Distance:   Left Eye Distance:   Bilateral Distance:    Right Eye Near:   Left Eye Near:    Bilateral Near:     Physical Exam Vitals and nursing note reviewed.  Constitutional:      General: She is not in acute distress.    Appearance: Normal appearance. She is well-developed. She is obese. She is ill-appearing. She is not diaphoretic.     Comments: Patient is unable to get in a position of comfort in wheelchair in exam room due to rib pain.  She is tearful.   HENT:     Head: Normocephalic and atraumatic.     Right Ear: Hearing, tympanic membrane, ear canal and external ear normal.     Left Ear: Hearing, tympanic membrane, ear canal and external ear normal.     Nose: Nose normal.     Mouth/Throat:     Lips: Pink.     Mouth: Mucous membranes are moist.     Pharynx: Oropharynx is clear. Uvula midline.     Tonsils: No tonsillar exudate or tonsillar abscesses.  Eyes:     General: Lids are normal. Vision grossly intact. Gaze aligned appropriately.     Extraocular Movements: Extraocular movements intact.     Conjunctiva/sclera: Conjunctivae normal.  Cardiovascular:      Rate and Rhythm: Normal rate and regular rhythm.     Heart sounds: Normal heart sounds. No murmur heard.   No friction rub. No gallop.  Pulmonary:     Effort: Pulmonary effort is normal. No respiratory distress.     Breath sounds: Examination of the right-lower field reveals rhonchi and rales. Rhonchi and rales present. No wheezing.     Comments: Equal lung sounds heard bilaterally. Chest:     Chest wall: No tenderness.  Abdominal:     General: Abdomen is protuberant. Bowel sounds are normal.     Palpations: Abdomen is soft.     Tenderness: There is no abdominal tenderness. There is no right CVA tenderness or left CVA tenderness.  Musculoskeletal:        General: No swelling.     Cervical back: Normal, normal range of motion and neck supple. No tenderness.     Comments: Full range of motion of bilateral shoulders and neck.  No crepitus with neck movement.  No pain to any other joints in patient's body.  Significant tenderness with palpation of right rib cage behind patient's inferior right breast.  Tenderness increases with inspiration.   Lymphadenopathy:     Cervical: No cervical adenopathy.  Skin:    General: Skin is warm and dry.     Capillary Refill: Capillary refill takes less than 2 seconds.     Findings: No rash.  Neurological:     General: No focal deficit present.     Mental Status: She is alert and oriented to person, place, and time.  Psychiatric:  Mood and Affect: Mood normal.        Behavior: Behavior normal.        Thought Content: Thought content normal.        Judgment: Judgment normal.     UC Treatments / Results  Labs (all labs ordered are listed, but only abnormal results are displayed) Labs Reviewed - No data to display  EKG   Radiology DG Chest 2 View  Result Date: 09/11/2021 CLINICAL DATA:  Fall. EXAM: CHEST - 2 VIEW COMPARISON:  March 21, 2020. FINDINGS: The heart size and mediastinal contours are within normal limits. Minimal left basilar  subsegmental atelectasis is noted. Mild right basilar opacity is noted concerning for atelectasis or infiltrate. The visualized skeletal structures are unremarkable. IMPRESSION: Mild right basilar opacity is noted concerning for atelectasis or infiltrate. Electronically Signed   By: Marijo Conception M.D.   On: 09/11/2021 13:52    Procedures Procedures (including critical care time)  Medications Ordered in UC Medications  ketorolac (TORADOL) 30 MG/ML injection 30 mg (30 mg Intramuscular Given 09/11/21 1325)    Initial Impression / Assessment and Plan / UC Course  I have reviewed the triage vital signs and the nursing notes.  Pertinent labs & imaging results that were available during my care of the patient were reviewed by me and considered in my medical decision making (see chart for details).  Patient is a 66 year old female presenting to urgent care for evaluation after she sustained a fall 2 days ago onto a DVD case at her home.  She is in significant pain at this time to her right rib cage behind her right lower breast.  Kidney function normal per BMP from December 2022.  Patient denies allergy to NSAIDs.  Patient given ketorolac 30 mg IM injection for pain management in clinic.  There is concern for acute rib fracture and acute pneumonia versus pleural effusion to right lower lobe due to rales and rhonchi heard to auscultation of right lower lung lobe.   Chest x-ray shows possible atelectasis to the right lower lobe that is likely a result of the patient's fall a couple of days ago. She is unable to expand her lungs all the way causing possible atelectasis. Incentive spirometer given to patient at urgent care today with instructions to use incentive spirometer 5-10 times every hour while she is awake to encourage lungs to be able to expand to full capacity.   Plan to discharge patient home with supportive care prescriptions for pain. She may take ibuprofen '600mg'$  and tylenol '1000mg'$  every 6 hours  as needed for pain. BMP from 03/2021 shows normal kidney function.   Counseled patient regarding appropriate use of medications and potential side effects for all medications recommended or prescribed today. Discussed red flag signs and symptoms of worsening condition,when to call the PCP office, return to urgent care, and when to seek higher level of care. Patient verbalizes understanding and agreement with plan. All questions answered. Patient discharged in stable condition.  Final Clinical Impressions(s) / UC Diagnoses   Final diagnoses:  Fall, initial encounter  Rib pain on right side     Discharge Instructions      You were seen in urgent care today for evaluation after your fall.  Your chest x-ray did not show any broken ribs or pneumonia. Please use the incentive spirometer provided at the urgent care today 5-10 times every hour to promote full expansion of your lungs when you take in a deep breath.  Take ibuprofen and tylenol every 6 hours as needed for pain. Do not take your mobic medication while you take ibuprofen. You were given a pain injection in the clinic today that is similar to ibuprofen, so do not take any ibuprofen until 10pm tonight if you need it.   If you develop any new or worsening symptoms or do not improve in the next 2 to 3 days, please return.  If your symptoms are severe, please go to the emergency room.  Follow-up with your primary care provider for further evaluation and management of your symptoms as well as ongoing wellness visits.  I hope you feel better!     ED Prescriptions     Medication Sig Dispense Auth. Provider   ibuprofen (ADVIL) 600 MG tablet Take 1 tablet (600 mg total) by mouth every 6 (six) hours as needed. 30 tablet Joella Prince M, FNP   acetaminophen (TYLENOL) 500 MG tablet Take 2 tablets (1,000 mg total) by mouth every 6 (six) hours as needed. 30 tablet Talbot Grumbling, FNP      PDMP not reviewed this encounter.    Talbot Grumbling, [place] 09/13/21 1812

## 2021-09-11 NOTE — Discharge Instructions (Addendum)
You were seen in urgent care today for evaluation after your fall.  Your chest x-ray did not show any broken ribs or pneumonia. Please use the incentive spirometer provided at the urgent care today 5-10 times every hour to promote full expansion of your lungs when you take in a deep breath.   Take ibuprofen and tylenol every 6 hours as needed for pain. Do not take your mobic medication while you take ibuprofen. You were given a pain injection in the clinic today that is similar to ibuprofen, so do not take any ibuprofen until 10pm tonight if you need it.   If you develop any new or worsening symptoms or do not improve in the next 2 to 3 days, please return.  If your symptoms are severe, please go to the emergency room.  Follow-up with your primary care provider for further evaluation and management of your symptoms as well as ongoing wellness visits.  I hope you feel better!

## 2021-12-10 ENCOUNTER — Ambulatory Visit: Payer: Medicare Other | Admitting: Podiatry

## 2022-01-28 ENCOUNTER — Encounter: Payer: Self-pay | Admitting: Podiatry

## 2022-01-28 ENCOUNTER — Ambulatory Visit (INDEPENDENT_AMBULATORY_CARE_PROVIDER_SITE_OTHER): Payer: Medicare Other | Admitting: Podiatry

## 2022-01-28 DIAGNOSIS — N289 Disorder of kidney and ureter, unspecified: Secondary | ICD-10-CM

## 2022-01-28 DIAGNOSIS — B351 Tinea unguium: Secondary | ICD-10-CM | POA: Diagnosis not present

## 2022-01-28 DIAGNOSIS — M79675 Pain in left toe(s): Secondary | ICD-10-CM

## 2022-01-28 DIAGNOSIS — M79674 Pain in right toe(s): Secondary | ICD-10-CM | POA: Diagnosis not present

## 2022-01-28 NOTE — Progress Notes (Signed)
This patient returns to my office for at risk foot care.  This patient requires this care by a professional since this patient will be at risk due to having renal insufficiency. This patient is unable to cut nails herself since the patient cannot reach her nails.These nails are painful walking and wearing shoes.  This patient presents for at risk foot care today.  General Appearance  Alert, conversant and in no acute stress.  Vascular  Dorsalis pedal pulses are palpable  bilaterally. Posterior tibial pulses are weakly palpable bilateral. Capillary return is within normal limits  bilaterally. Temperature is within normal limits  bilaterally.  Neurologic  Senn-Weinstein monofilament wire test diminished  bilaterally. Muscle power within normal limits bilaterally.  Nails Thick disfigured discolored nails with subungual debris  from hallux to fifth toes bilaterally. No evidence of bacterial infection or drainage bilaterally.  Orthopedic  No limitations of motion  feet .  No crepitus or effusions noted.  No bony pathology or digital deformities noted.HAV  B/L.  Skin  normotropic skin with no porokeratosis noted bilaterally.  No signs of infections or ulcers noted.     Onychomycosis  Pain in right toes  Pain in left toes  Consent was obtained for treatment procedures.   Mechanical debridement of nails 1-5  bilaterally performed with a nail nipper.  Filed with dremel without incident.    Return office visit    3  months                  Told patient to return for periodic foot care and evaluation due to potential at risk complications.   Gardiner Barefoot DPM

## 2022-01-30 ENCOUNTER — Telehealth: Payer: Self-pay | Admitting: Emergency Medicine

## 2022-01-30 NOTE — Telephone Encounter (Signed)
Copied from [place] [id number]. Topic: General - Inquiry >> Jan 30, 2022  2:56 PM Penni Bombard wrote: Reason for CRM: Pt called asking about papers that were faxed for home care and she is wanting to know if we got them and could fax them back  CB@  [phone]

## 2022-02-04 NOTE — Telephone Encounter (Signed)
Form has been received and will be faxed once complete. 

## 2022-02-17 DIAGNOSIS — H0288B Meibomian gland dysfunction left eye, upper and lower eyelids: Secondary | ICD-10-CM | POA: Diagnosis not present

## 2022-02-17 DIAGNOSIS — H2513 Age-related nuclear cataract, bilateral: Secondary | ICD-10-CM | POA: Diagnosis not present

## 2022-02-17 DIAGNOSIS — H35033 Hypertensive retinopathy, bilateral: Secondary | ICD-10-CM | POA: Diagnosis not present

## 2022-02-17 DIAGNOSIS — H0288A Meibomian gland dysfunction right eye, upper and lower eyelids: Secondary | ICD-10-CM | POA: Diagnosis not present

## 2022-05-04 ENCOUNTER — Ambulatory Visit: Payer: 59 | Admitting: Internal Medicine

## 2022-05-06 ENCOUNTER — Ambulatory Visit: Payer: Medicare Other | Admitting: Podiatry

## 2022-06-03 ENCOUNTER — Ambulatory Visit: Payer: 59 | Attending: Physician Assistant | Admitting: Physician Assistant

## 2022-06-03 ENCOUNTER — Encounter: Payer: Self-pay | Admitting: Physician Assistant

## 2022-06-03 VITALS — BP 161/11 | HR 89 | Wt 178.8 lb

## 2022-06-03 DIAGNOSIS — E785 Hyperlipidemia, unspecified: Secondary | ICD-10-CM

## 2022-06-03 DIAGNOSIS — R0981 Nasal congestion: Secondary | ICD-10-CM

## 2022-06-03 DIAGNOSIS — M170 Bilateral primary osteoarthritis of knee: Secondary | ICD-10-CM

## 2022-06-03 DIAGNOSIS — Z91199 Patient's noncompliance with other medical treatment and regimen due to unspecified reason: Secondary | ICD-10-CM

## 2022-06-03 DIAGNOSIS — I10 Essential (primary) hypertension: Secondary | ICD-10-CM | POA: Diagnosis not present

## 2022-06-03 DIAGNOSIS — G4700 Insomnia, unspecified: Secondary | ICD-10-CM | POA: Diagnosis not present

## 2022-06-03 DIAGNOSIS — E559 Vitamin D deficiency, unspecified: Secondary | ICD-10-CM | POA: Diagnosis not present

## 2022-06-03 DIAGNOSIS — R739 Hyperglycemia, unspecified: Secondary | ICD-10-CM | POA: Diagnosis not present

## 2022-06-03 MED ORDER — METOPROLOL SUCCINATE ER 100 MG PO TB24: ORAL_TABLET | ORAL | 1 refills | Status: AC

## 2022-06-03 MED ORDER — ACETAMINOPHEN 500 MG PO TABS: 1000.0000 mg | ORAL_TABLET | Freq: Three times a day (TID) | ORAL | 3 refills | Status: AC | PRN

## 2022-06-03 MED ORDER — PREGABALIN 50 MG PO CAPS: 50.0000 mg | ORAL_CAPSULE | Freq: Every day | ORAL | 3 refills | Status: AC

## 2022-06-03 MED ORDER — FLUTICASONE PROPIONATE 50 MCG/ACT NA SUSP: NASAL | 3 refills | Status: AC

## 2022-06-03 MED ORDER — HYDROCHLOROTHIAZIDE 12.5 MG PO TABS: 12.5000 mg | ORAL_TABLET | Freq: Every day | ORAL | 1 refills | Status: AC

## 2022-06-03 MED ORDER — FENOFIBRATE 48 MG PO TABS: 48.0000 mg | ORAL_TABLET | Freq: Every day | ORAL | 1 refills | Status: AC

## 2022-06-03 MED ORDER — MELOXICAM 15 MG PO TABS: 15.0000 mg | ORAL_TABLET | Freq: Every day | ORAL | 3 refills | Status: AC

## 2022-06-03 MED ORDER — LISINOPRIL 40 MG PO TABS: 40.0000 mg | ORAL_TABLET | Freq: Every day | ORAL | 1 refills | Status: AC

## 2022-06-03 MED ORDER — AMLODIPINE BESYLATE 10 MG PO TABS: 10.0000 mg | ORAL_TABLET | Freq: Every day | ORAL | 1 refills | Status: AC

## 2022-06-03 NOTE — Patient Instructions (Addendum)
Check blood pressure daily and record and schedule appt if not at <130/<85 within 4 weeks  With amlodipine, lisinopril, and HCTZ-take 1/2 tablet daily for the first week then increase to a whole tablet daily   Melatonin 3 to 5 mg an hour before bed   Insomnia Insomnia is a sleep disorder that makes it difficult to fall asleep or stay asleep. Insomnia can cause fatigue, low energy, difficulty concentrating, mood swings, and poor performance at work or school. There are three different ways to classify insomnia: Difficulty falling asleep. Difficulty staying asleep. Waking up too early in the morning. Any type of insomnia can be long-term (chronic) or short-term (acute). Both are common. Short-term insomnia usually lasts for 3 months or less. Chronic insomnia occurs at least three times a week for longer than 3 months. What are the causes? Insomnia may be caused by another condition, situation, or substance, such as: Having certain mental health conditions, such as anxiety and depression. Using caffeine, alcohol, tobacco, or drugs. Having gastrointestinal conditions, such as gastroesophageal reflux disease (GERD). Having certain medical conditions. These include: Asthma. Alzheimer's disease. Stroke. Chronic pain. An overactive thyroid gland (hyperthyroidism). Other sleep disorders, such as restless legs syndrome and sleep apnea. Menopause. Sometimes, the cause of insomnia may not be known. What increases the risk? Risk factors for insomnia include: Gender. Females are affected more often than males. Age. Insomnia is more common as people get older. Stress and certain medical and mental health conditions. Lack of exercise. Having an irregular work schedule. This may include working night shifts and traveling between different time zones. What are the signs or symptoms? If you have insomnia, the main symptom is having trouble falling asleep or having trouble staying asleep. This may  lead to other symptoms, such as: Feeling tired or having low energy. Feeling nervous about going to sleep. Not feeling rested in the morning. Having trouble concentrating. Feeling irritable, anxious, or depressed. How is this diagnosed? This condition may be diagnosed based on: Your symptoms and medical history. Your health care provider may ask about: Your sleep habits. Any medical conditions you have. Your mental health. A physical exam. How is this treated? Treatment for insomnia depends on the cause. Treatment may focus on treating an underlying condition that is causing the insomnia. Treatment may also include: Medicines to help you sleep. Counseling or therapy. Lifestyle adjustments to help you sleep better. Follow these instructions at home: Eating and drinking  Limit or avoid alcohol, caffeinated beverages, and products that contain nicotine and tobacco, especially close to bedtime. These can disrupt your sleep. Do not eat a large meal or eat spicy foods right before bedtime. This can lead to digestive discomfort that can make it hard for you to sleep. Sleep habits  Keep a sleep diary to help you and your health care provider figure out what could be causing your insomnia. Write down: When you sleep. When you wake up during the night. How well you sleep and how rested you feel the next day. Any side effects of medicines you are taking. What you eat and drink. Make your bedroom a dark, comfortable place where it is easy to fall asleep. Put up shades or blackout curtains to block light from outside. Use a white noise machine to block noise. Keep the temperature cool. Limit screen use before bedtime. This includes: Not watching TV. Not using your smartphone, tablet, or computer. Stick to a routine that includes going to bed and waking up at the same times  every day and night. This can help you fall asleep faster. Consider making a quiet activity, such as reading, part of  your nighttime routine. Try to avoid taking naps during the day so that you sleep better at night. Get out of bed if you are still awake after 15 minutes of trying to sleep. Keep the lights down, but try reading or doing a quiet activity. When you feel sleepy, go back to bed. General instructions Take over-the-counter and prescription medicines only as told by your health care provider. Exercise regularly as told by your health care provider. However, avoid exercising in the hours right before bedtime. Use relaxation techniques to manage stress. Ask your health care provider to suggest some techniques that may work well for you. These may include: Breathing exercises. Routines to release muscle tension. Visualizing peaceful scenes. Make sure that you drive carefully. Do not drive if you feel very sleepy. Keep all follow-up visits. This is important. Contact a health care provider if: You are tired throughout the day. You have trouble in your daily routine due to sleepiness. You continue to have sleep problems, or your sleep problems get worse. Get help right away if: You have thoughts about hurting yourself or someone else. Get help right away if you feel like you may hurt yourself or others, or have thoughts about taking your own life. Go to your nearest emergency room or: Call 911. Call the [place] at [phone] or 988. This is open 24 hours a day. Text the Crisis Text Line at 470-784-2544. Summary Insomnia is a sleep disorder that makes it difficult to fall asleep or stay asleep. Insomnia can be long-term (chronic) or short-term (acute). Treatment for insomnia depends on the cause. Treatment may focus on treating an underlying condition that is causing the insomnia. Keep a sleep diary to help you and your health care provider figure out what could be causing your insomnia. This information is not intended to replace advice given to you by your health care  provider. Make sure you discuss any questions you have with your health care provider. Document Revised: 03/10/2021 Document Reviewed: 03/10/2021 Elsevier Patient Education  [place].

## 2022-06-03 NOTE — Progress Notes (Signed)
Patient ID: Shannon [name], female   DOB: Oct 14, 1955, 67 y.o.   MRN: [id number]   Shannon [name], is a 67 y.o. female  [id number]  [id number]  DOB - November 10, 1955  Chief Complaint  Patient presents with   Medication Refill       Subjective:   Shannon [name] is a 67 y.o. female here today for med RF.  No new issues or concerns.  She has been out of meds for a while.  Has not had an appt in a long time.  Says she was living out of town and did not seek medical elsewhere.  She denies HA/CP/SOB/dizziness.  Still smokes but has been cutting back.    No problems updated.  ALLERGIES: Allergies  Allergen Reactions   Morphine And Related Itching    PAST MEDICAL HISTORY: Past Medical History:  Diagnosis Date   Arthritis    Cataract 12/2017   Dr. Schuyler Amor   Dizziness    occasionally   Headache    Hyperlipidemia    takes Tricor daily   Hypertension    takes Lisinopril and Metoprolol daily   Hypertensive retinopathy 12/2017   Dr. Schuyler Amor   Numbness    stinging in right hand   Peripheral edema    takes lasix daily   Pneumonia    Ventral hernia     MEDICATIONS AT HOME: Prior to Admission medications   Medication Sig Start Date End Date Taking? Authorizing Provider  cyclobenzaprine (FLEXERIL) 5 MG tablet Take by mouth. 03/27/20  Yes [provider]  fenofibrate (TRICOR) 48 MG tablet Take by mouth.   Yes [provider]  ibuprofen (ADVIL) 600 MG tablet Take 1 tablet (600 mg total) by mouth every 6 (six) hours as needed. 09/11/21  Yes Talbot Grumbling, FNP  acetaminophen (TYLENOL) 500 MG tablet Take 2 tablets (1,000 mg total) by mouth every 8 (eight) hours as needed. 06/03/22   Argentina Donovan, PA-C  amLODipine (NORVASC) 10 MG tablet Take 1 tablet (10 mg total) by mouth daily. 06/03/22   Argentina Donovan, PA-C  fluticasone (FLONASE) 50 MCG/ACT nasal spray SHAKE LIQUID AND USE 2 SPRAYS IN [facility] NOSTRIL DAILY 06/03/22   Freeman Caldron M, PA-C   hydrochlorothiazide (HYDRODIURIL) 12.5 MG tablet Take 1 tablet (12.5 mg total) by mouth daily. 06/03/22   Argentina Donovan, PA-C  lisinopril (ZESTRIL) 40 MG tablet Take 1 tablet (40 mg total) by mouth daily. 06/03/22   Argentina Donovan, PA-C  meloxicam (MOBIC) 15 MG tablet Take 1 tablet (15 mg total) by mouth daily. 06/03/22   Argentina Donovan, PA-C  metoprolol succinate (TOPROL-XL) 100 MG 24 hr tablet Take with or immediately following a meal. 06/03/22   Veer Elamin, Dionne Bucy, PA-C  pregabalin (LYRICA) 50 MG capsule Take 1 capsule (50 mg total) by mouth daily. 06/03/22   Allan Minotti, Dionne Bucy, PA-C    ROS: Neg HEENT Neg resp Neg cardiac Neg GI Neg GU Neg MS Neg psych Neg neuro  Objective:   Vitals:   06/03/22 0858 06/03/22 0919  BP: (!) 162/113 (!) 161/11  Pulse: 89   SpO2: 93%   Weight: 178 lb 12.8 oz (81.1 kg)    Exam General appearance : Awake, alert, not in any distress. Speech Clear. Not toxic looking HEENT: Atraumatic and Normocephalic Neck: Supple, no JVD. No cervical lymphadenopathy.  Chest: Good air entry bilaterally, CTAB.  No rales/rhonchi/wheezing CVS: S1 S2 regular, no murmurs.  Extremities: B/L Lower Ext shows no edema, both legs are  warm to touch Neurology: Awake alert, and oriented X 3, CN II-XII intact, Non focal Skin: No Rash  Data Review Lab Results  Component Value Date   HGBA1C 5.6 07/04/2020   HGBA1C 5.7 (H) 01/05/2019   HGBA1C 5.4 06/14/2013    Assessment & Plan   1. Essential hypertension Uncontrolled-not on meds for months.  Still smoking smoking cessation advised.  Check blood pressure daily and record and schedule appt if not at <130/<85 within 4 weeks With amlodipine, lisinopril, and HCTZ-take 1/2 tablet daily for the first week then increase to a whole tablet daily - Comprehensive metabolic panel - CBC with Differential/Platelet - metoprolol succinate (TOPROL-XL) 100 MG 24 hr tablet; Take with or immediately following a meal.  Dispense: 90  tablet; Refill: 1 - lisinopril (ZESTRIL) 40 MG tablet; Take 1 tablet (40 mg total) by mouth daily.  Dispense: 90 tablet; Refill: 1 - hydrochlorothiazide (HYDRODIURIL) 12.5 MG tablet; Take 1 tablet (12.5 mg total) by mouth daily.  Dispense: 90 tablet; Refill: 1 - amLODipine (NORVASC) 10 MG tablet; Take 1 tablet (10 mg total) by mouth daily.  Dispense: 90 tablet; Refill: 1  2. Dyslipidemia Resume fenofibrate - Comprehensive metabolic panel - Lipid panel  3. Vitamin D deficiency Check vitamin D  4. Elevated blood sugar I have advised the patient to work at a goal of eliminating sugary drinks, candy, desserts, sweets, refined sugars, processed foods, and white carbohydrates.  The patient expresses understanding.   - Hemoglobin A1c - Comprehensive metabolic panel  5. Sinus congestion - fluticasone (FLONASE) 50 MCG/ACT nasal spray; SHAKE LIQUID AND USE 2 SPRAYS IN EACH NOSTRIL DAILY  Dispense: 48 g; Refill: 3  6. Insomnia, unspecified type Try melatonin  7. Primary osteoarthritis of both knees - pregabalin (LYRICA) 50 MG capsule; Take 1 capsule (50 mg total) by mouth daily.  Dispense: 30 capsule; Refill: 3 - meloxicam (MOBIC) 15 MG tablet; Take 1 tablet (15 mg total) by mouth daily.  Dispense: 30 tablet; Refill: 3 - acetaminophen (TYLENOL) 500 MG tablet; Take 2 tablets (1,000 mg total) by mouth every 8 (eight) hours as needed.  Dispense: 60 tablet; Refill: 3  8. Noncompliance Compliance imperative!!    Return in about 4 months (around 10/02/2022) for Dr Wynetta Emery for chronic conditions.  The patient was given clear instructions to go to ER or return to medical center if symptoms don't improve, worsen or new problems develop. The patient verbalized understanding. The patient was told to call to get lab results if they haven't heard anything in the next week.      Freeman Caldron, PA-C [facility] [place], [place]   06/03/2022, 9:20  AM

## 2022-06-04 ENCOUNTER — Other Ambulatory Visit: Payer: Self-pay | Admitting: Physician Assistant

## 2022-06-04 DIAGNOSIS — E559 Vitamin D deficiency, unspecified: Secondary | ICD-10-CM

## 2022-06-04 DIAGNOSIS — R7303 Prediabetes: Secondary | ICD-10-CM

## 2022-06-04 LAB — LIPID PANEL
Chol/HDL Ratio: 3.1 ratio (ref 0.0–4.4)
Cholesterol, Total: 192 mg/dL (ref 100–199)
HDL: 62 mg/dL (ref >39)
LDL Chol Calc (NIH): 115 mg/dL — ABNORMAL HIGH (ref 0–99)
Triglycerides: 84 mg/dL (ref 0–149)
VLDL Cholesterol Cal: 15 mg/dL (ref 5–40)

## 2022-06-04 LAB — CBC WITH DIFFERENTIAL/PLATELET
Basophils Absolute: 0.1 10*3/uL (ref 0.0–0.2)
Basos: 1 %
EOS (ABSOLUTE): 0.1 10*3/uL (ref 0.0–0.4)
Eos: 2 %
Hematocrit: 47.6 % — ABNORMAL HIGH (ref 34.0–46.6)
Hemoglobin: 14.5 g/dL (ref 11.1–15.9)
Immature Grans (Abs): 0 10*3/uL (ref 0.0–0.1)
Immature Granulocytes: 0 %
Lymphocytes Absolute: 2.3 10*3/uL (ref 0.7–3.1)
Lymphs: 35 %
MCH: 23.4 pg — ABNORMAL LOW (ref 26.6–33.0)
MCHC: 30.5 g/dL — ABNORMAL LOW (ref 31.5–35.7)
MCV: 77 fL — ABNORMAL LOW (ref 79–97)
Monocytes Absolute: 0.5 10*3/uL (ref 0.1–0.9)
Monocytes: 7 %
Neutrophils Absolute: 3.6 10*3/uL (ref 1.4–7.0)
Neutrophils: 55 %
Platelets: 401 10*3/uL (ref 150–450)
RBC: 6.19 x10E6/uL — ABNORMAL HIGH (ref 3.77–5.28)
RDW: 16.5 % — ABNORMAL HIGH (ref 11.7–15.4)
WBC: 6.5 10*3/uL (ref 3.4–10.8)

## 2022-06-04 LAB — COMPREHENSIVE METABOLIC PANEL
ALT: 16 IU/L (ref 0–32)
AST: 15 IU/L (ref 0–40)
Albumin/Globulin Ratio: 1 — ABNORMAL LOW (ref 1.2–2.2)
Albumin: 4.1 g/dL (ref 3.9–4.9)
Alkaline Phosphatase: 89 IU/L (ref 44–121)
BUN/Creatinine Ratio: 17 (ref 12–28)
BUN: 16 mg/dL (ref 8–27)
Bilirubin Total: 0.2 mg/dL (ref 0.0–1.2)
CO2: 23 mmol/L (ref 20–29)
Calcium: 9.9 mg/dL (ref 8.7–10.3)
Chloride: 104 mmol/L (ref 96–106)
Creatinine, Ser: 0.93 mg/dL (ref 0.57–1.00)
Globulin, Total: 4.1 g/dL (ref 1.5–4.5)
Glucose: 115 mg/dL — ABNORMAL HIGH (ref 70–99)
Potassium: 5 mmol/L (ref 3.5–5.2)
Sodium: 142 mmol/L (ref 134–144)
Total Protein: 8.2 g/dL (ref 6.0–8.5)
eGFR: 69 mL/min/{1.73_m2} (ref >59)

## 2022-06-04 LAB — VITAMIN D 25 HYDROXY (VIT D DEFICIENCY, FRACTURES): Vit D, 25-Hydroxy: 8.7 ng/mL — ABNORMAL LOW (ref 30.0–100.0)

## 2022-06-04 LAB — HEMOGLOBIN A1C
Est. average glucose Bld gHb Est-mCnc: 128 mg/dL
Hgb A1c MFr Bld: 6.1 % — ABNORMAL HIGH (ref 4.8–5.6)

## 2022-06-04 MED ORDER — VITAMIN D (ERGOCALCIFEROL) 1.25 MG (50000 UNIT) PO CAPS: 50000.0000 [IU] | ORAL_CAPSULE | ORAL | 0 refills | Status: AC

## 2022-06-04 MED ORDER — METFORMIN HCL 500 MG PO TABS: 500.0000 mg | ORAL_TABLET | Freq: Every day | ORAL | 1 refills | Status: AC

## 2022-06-11 ENCOUNTER — Telehealth: Payer: Self-pay | Admitting: Internal Medicine

## 2022-06-11 NOTE — Telephone Encounter (Signed)
Copied from [place] [id number]. Topic: Medicare AWV >> Jun 11, 2022 11:53 AM Carolan Shiver wrote: Reason for CRM: Called patient to schedule Medicare Annual Wellness Visit (AWV). No voicemail available to leave a message.  Last date of AWV: 03/14/21   If any questions, please contact me at [phone].  Thank you ,  Barkley Boards AWV direct phone # [phone]

## 2022-06-25 ENCOUNTER — Encounter: Payer: Self-pay | Admitting: Podiatry

## 2022-07-20 ENCOUNTER — Ambulatory Visit: Payer: 59 | Admitting: Podiatry

## 2022-07-23 ENCOUNTER — Other Ambulatory Visit: Payer: Self-pay | Admitting: Internal Medicine

## 2022-07-23 DIAGNOSIS — Z0000 Encounter for general adult medical examination without abnormal findings: Secondary | ICD-10-CM

## 2022-07-28 ENCOUNTER — Ambulatory Visit: Payer: 59 | Admitting: Podiatry

## 2022-08-10 ENCOUNTER — Ambulatory Visit: Payer: 59 | Admitting: Podiatry

## 2022-08-21 ENCOUNTER — Encounter: Payer: Self-pay | Admitting: Podiatry

## 2022-08-21 ENCOUNTER — Ambulatory Visit (INDEPENDENT_AMBULATORY_CARE_PROVIDER_SITE_OTHER): Payer: 59 | Admitting: Podiatry

## 2022-08-21 DIAGNOSIS — M79674 Pain in right toe(s): Secondary | ICD-10-CM

## 2022-08-21 DIAGNOSIS — B351 Tinea unguium: Secondary | ICD-10-CM

## 2022-08-21 DIAGNOSIS — M79675 Pain in left toe(s): Secondary | ICD-10-CM

## 2022-08-21 DIAGNOSIS — N289 Disorder of kidney and ureter, unspecified: Secondary | ICD-10-CM

## 2022-08-21 NOTE — Progress Notes (Signed)
This patient returns to my office for at risk foot care.  This patient requires this care by a professional since this patient will be at risk due to having renal insufficiency. This patient is unable to cut nails herself since the patient cannot reach her nails.These nails are painful walking and wearing shoes.  This patient presents for at risk foot care today.  General Appearance  Alert, conversant and in no acute stress.  Vascular  Dorsalis pedal pulses are palpable  bilaterally. Posterior tibial pulses are weakly palpable bilateral. Capillary return is within normal limits  bilaterally. Temperature is within normal limits  bilaterally.  Neurologic  Senn-Weinstein monofilament wire test diminished  bilaterally. Muscle power within normal limits bilaterally.  Nails Thick disfigured discolored nails with subungual debris  from hallux to fifth toes bilaterally. No evidence of bacterial infection or drainage bilaterally.  Orthopedic  No limitations of motion  feet .  No crepitus or effusions noted.  No bony pathology or digital deformities noted.HAV  B/L.  Skin  normotropic skin with no porokeratosis noted bilaterally.  No signs of infections or ulcers noted.     Onychomycosis  Pain in right toes  Pain in left toes  Consent was obtained for treatment procedures.   Mechanical debridement of nails 1-5  bilaterally performed with a nail nipper.  Filed with dremel without incident.    Return office visit    3  months                  Told patient to return for periodic foot care and evaluation due to potential at risk complications.   Minahil Quinlivan DPM   

## 2022-08-31 ENCOUNTER — Ambulatory Visit: Payer: 59

## 2022-09-03 ENCOUNTER — Ambulatory Visit: Payer: 59

## 2022-09-09 ENCOUNTER — Emergency Department (HOSPITAL_COMMUNITY): Payer: 59

## 2022-09-09 ENCOUNTER — Inpatient Hospital Stay (HOSPITAL_COMMUNITY)
Admission: EM | Admit: 2022-09-09 | Discharge: 2022-09-29 | DRG: 963 | Disposition: A | Discharge status: Skilled Nursing Facility | Payer: 59 | Attending: Surgery | Admitting: Surgery

## 2022-09-09 DIAGNOSIS — F430 Acute stress reaction: Secondary | ICD-10-CM

## 2022-09-09 DIAGNOSIS — Z87891 Personal history of nicotine dependence: Secondary | ICD-10-CM

## 2022-09-09 DIAGNOSIS — F4320 Adjustment disorder, unspecified: Secondary | ICD-10-CM

## 2022-09-09 DIAGNOSIS — W3400XA Accidental discharge from unspecified firearms or gun, initial encounter: Principal | ICD-10-CM

## 2022-09-09 DIAGNOSIS — G839 Paralytic syndrome, unspecified: Secondary | ICD-10-CM

## 2022-09-09 DIAGNOSIS — Y95 Nosocomial condition: Secondary | ICD-10-CM | POA: Diagnosis not present

## 2022-09-09 DIAGNOSIS — J9601 Acute respiratory failure with hypoxia: Secondary | ICD-10-CM | POA: Diagnosis not present

## 2022-09-09 DIAGNOSIS — F17210 Nicotine dependence, cigarettes, uncomplicated: Secondary | ICD-10-CM | POA: Diagnosis present

## 2022-09-09 DIAGNOSIS — S299XXA Unspecified injury of thorax, initial encounter: Secondary | ICD-10-CM | POA: Diagnosis not present

## 2022-09-09 DIAGNOSIS — Z23 Encounter for immunization: Secondary | ICD-10-CM | POA: Diagnosis not present

## 2022-09-09 DIAGNOSIS — J939 Pneumothorax, unspecified: Secondary | ICD-10-CM | POA: Diagnosis present

## 2022-09-09 DIAGNOSIS — N319 Neuromuscular dysfunction of bladder, unspecified: Secondary | ICD-10-CM | POA: Diagnosis present

## 2022-09-09 DIAGNOSIS — S31139A Puncture wound of abdominal wall without foreign body, unspecified quadrant without penetration into peritoneal cavity, initial encounter: Secondary | ICD-10-CM | POA: Diagnosis present

## 2022-09-09 DIAGNOSIS — S21239A Puncture wound without foreign body of unspecified back wall of thorax without penetration into thoracic cavity, initial encounter: Secondary | ICD-10-CM | POA: Diagnosis not present

## 2022-09-09 DIAGNOSIS — D62 Acute posthemorrhagic anemia: Secondary | ICD-10-CM | POA: Diagnosis not present

## 2022-09-09 DIAGNOSIS — I471 Supraventricular tachycardia, unspecified: Secondary | ICD-10-CM | POA: Diagnosis not present

## 2022-09-09 DIAGNOSIS — Z7984 Long term (current) use of oral hypoglycemic drugs: Secondary | ICD-10-CM

## 2022-09-09 DIAGNOSIS — F1411 Cocaine abuse, in remission: Secondary | ICD-10-CM | POA: Diagnosis present

## 2022-09-09 DIAGNOSIS — S3991XA Unspecified injury of abdomen, initial encounter: Secondary | ICD-10-CM | POA: Diagnosis not present

## 2022-09-09 DIAGNOSIS — S1190XA Unspecified open wound of unspecified part of neck, initial encounter: Secondary | ICD-10-CM | POA: Diagnosis not present

## 2022-09-09 DIAGNOSIS — J811 Chronic pulmonary edema: Secondary | ICD-10-CM | POA: Diagnosis not present

## 2022-09-09 DIAGNOSIS — Z981 Arthrodesis status: Secondary | ICD-10-CM | POA: Diagnosis not present

## 2022-09-09 DIAGNOSIS — S27322A Contusion of lung, bilateral, initial encounter: Secondary | ICD-10-CM | POA: Diagnosis not present

## 2022-09-09 DIAGNOSIS — F4322 Adjustment disorder with anxiety: Secondary | ICD-10-CM | POA: Diagnosis present

## 2022-09-09 DIAGNOSIS — S3993XA Unspecified injury of pelvis, initial encounter: Secondary | ICD-10-CM | POA: Diagnosis not present

## 2022-09-09 DIAGNOSIS — S0990XA Unspecified injury of head, initial encounter: Secondary | ICD-10-CM | POA: Diagnosis not present

## 2022-09-09 DIAGNOSIS — K5900 Constipation, unspecified: Secondary | ICD-10-CM | POA: Diagnosis present

## 2022-09-09 DIAGNOSIS — E119 Type 2 diabetes mellitus without complications: Secondary | ICD-10-CM | POA: Diagnosis not present

## 2022-09-09 DIAGNOSIS — E785 Hyperlipidemia, unspecified: Secondary | ICD-10-CM | POA: Diagnosis present

## 2022-09-09 DIAGNOSIS — S27321A Contusion of lung, unilateral, initial encounter: Secondary | ICD-10-CM | POA: Diagnosis not present

## 2022-09-09 DIAGNOSIS — S2241XA Multiple fractures of ribs, right side, initial encounter for closed fracture: Secondary | ICD-10-CM | POA: Diagnosis present

## 2022-09-09 DIAGNOSIS — S31109A Unspecified open wound of abdominal wall, unspecified quadrant without penetration into peritoneal cavity, initial encounter: Secondary | ICD-10-CM | POA: Diagnosis not present

## 2022-09-09 DIAGNOSIS — I10 Essential (primary) hypertension: Secondary | ICD-10-CM | POA: Diagnosis not present

## 2022-09-09 DIAGNOSIS — S24104A Unspecified injury at T11-T12 level of thoracic spinal cord, initial encounter: Secondary | ICD-10-CM | POA: Diagnosis not present

## 2022-09-09 DIAGNOSIS — F32A Depression, unspecified: Secondary | ICD-10-CM | POA: Diagnosis not present

## 2022-09-09 DIAGNOSIS — K5730 Diverticulosis of large intestine without perforation or abscess without bleeding: Secondary | ICD-10-CM | POA: Diagnosis not present

## 2022-09-09 DIAGNOSIS — K8020 Calculus of gallbladder without cholecystitis without obstruction: Secondary | ICD-10-CM | POA: Diagnosis not present

## 2022-09-09 DIAGNOSIS — Z79899 Other long term (current) drug therapy: Secondary | ICD-10-CM

## 2022-09-09 DIAGNOSIS — J189 Pneumonia, unspecified organism: Secondary | ICD-10-CM | POA: Diagnosis not present

## 2022-09-09 DIAGNOSIS — F4329 Adjustment disorder with other symptoms: Secondary | ICD-10-CM | POA: Diagnosis not present

## 2022-09-09 HISTORY — DX: Essential (primary) hypertension: I10

## 2022-09-09 HISTORY — DX: Hyperlipidemia, unspecified: E78.5

## 2022-09-09 LAB — PROTIME-INR
INR: 1.1 (ref 0.8–1.2)
Prothrombin Time: 14.1 seconds (ref 11.4–15.2)

## 2022-09-09 LAB — CBC
HCT: 39.5 % (ref 36.0–46.0)
Hemoglobin: 12.2 g/dL (ref 12.0–15.0)
MCH: 22.8 pg — ABNORMAL LOW (ref 26.0–34.0)
MCHC: 30.9 g/dL (ref 30.0–36.0)
MCV: 74 fL — ABNORMAL LOW (ref 80.0–100.0)
Platelets: 338 10*3/uL (ref 150–400)
RBC: 5.34 MIL/uL — ABNORMAL HIGH (ref 3.87–5.11)
RDW: 15.5 % (ref 11.5–15.5)
WBC: 6.3 10*3/uL (ref 4.0–10.5)
nRBC: 0 % (ref 0.0–0.2)

## 2022-09-09 LAB — I-STAT CHEM 8, ED
BUN: 12 mg/dL (ref 8–23)
Calcium, Ion: 1.16 mmol/L (ref 1.15–1.40)
Chloride: 104 mmol/L (ref 98–111)
Creatinine, Ser: 1.1 mg/dL — ABNORMAL HIGH (ref 0.44–1.00)
Glucose, Bld: 120 mg/dL — ABNORMAL HIGH (ref 70–99)
HCT: 42 % (ref 36.0–46.0)
Hemoglobin: 14.3 g/dL (ref 12.0–15.0)
Potassium: 4.1 mmol/L (ref 3.5–5.1)
Sodium: 142 mmol/L (ref 135–145)
TCO2: 26 mmol/L (ref 22–32)

## 2022-09-09 MED ORDER — SUCCINYLCHOLINE CHLORIDE 200 MG/10ML IV SOSY
PREFILLED_SYRINGE | INTRAVENOUS | Status: AC
  Administered 2022-09-09: 20 mg
  Filled 2022-09-09: qty 10

## 2022-09-09 MED ORDER — PROPOFOL 1000 MG/100ML IV EMUL
5.0000 ug/kg/min | INTRAVENOUS | Status: DC
  Administered 2022-09-09: 20 ug/kg/min via INTRAVENOUS

## 2022-09-09 MED ORDER — MIDAZOLAM-SODIUM CHLORIDE 100-0.9 MG/100ML-% IV SOLN
INTRAVENOUS | Status: AC
  Administered 2022-09-09: 2 mg/h
  Filled 2022-09-09: qty 100

## 2022-09-09 MED ORDER — ROCURONIUM BROMIDE 10 MG/ML (PF) SYRINGE
PREFILLED_SYRINGE | INTRAVENOUS | Status: AC
  Filled 2022-09-09: qty 10

## 2022-09-09 MED ORDER — IOHEXOL 350 MG/ML SOLN
75.0000 mL | Freq: Once | INTRAVENOUS | Status: AC | PRN
  Administered 2022-09-09: 75 mL via INTRAVENOUS

## 2022-09-09 MED ORDER — PROPOFOL 1000 MG/100ML IV EMUL
INTRAVENOUS | Status: AC
  Administered 2022-09-09: 40 ug/kg/min via INTRAVENOUS
  Filled 2022-09-09: qty 100

## 2022-09-09 MED ORDER — MIDAZOLAM HCL 2 MG/2ML IJ SOLN
INTRAMUSCULAR | Status: AC
  Filled 2022-09-09: qty 2

## 2022-09-09 MED ORDER — FENTANYL CITRATE PF 50 MCG/ML IJ SOSY
PREFILLED_SYRINGE | INTRAMUSCULAR | Status: AC
  Filled 2022-09-09: qty 2

## 2022-09-09 MED ORDER — ETOMIDATE 2 MG/ML IV SOLN
INTRAVENOUS | Status: AC
  Administered 2022-09-09: 20 mg
  Filled 2022-09-09: qty 20

## 2022-09-09 MED ORDER — FENTANYL 2500MCG IN NS 250ML (10MCG/ML) PREMIX INFUSION
INTRAVENOUS | Status: AC
  Administered 2022-09-09: 50 ug/h
  Filled 2022-09-09: qty 250

## 2022-09-09 MED ORDER — PROPOFOL 1000 MG/100ML IV EMUL
INTRAVENOUS | Status: AC
  Filled 2022-09-09: qty 100

## 2022-09-09 NOTE — ED Notes (Signed)
Additional Succinylcholine 20 mg Pushed IV L AC followed by NS flush

## 2022-09-09 NOTE — ED Notes (Signed)
Pt to CT at this time with RN Rachael and RT Kim, pt on vent and cardiac monitoring

## 2022-09-09 NOTE — ED Notes (Signed)
Etomidate 20 mg Pushed IV L AC followed by NS flush Succinylcholine 100 mg Pushed IV L AC followed by NS flush

## 2022-09-09 NOTE — ED Provider Notes (Signed)
  [place] EMERGENCY DEPARTMENT AT [facility] Provider Note      Procedures Procedure Name: Intubation Date/Time: 09/09/2022 11:53 PM  Performed by: Roxy Horseman, PA-CPre-anesthesia Checklist: Patient identified, Patient being monitored, Emergency Drugs available, Timeout performed and Suction available Oxygen Delivery Method: Non-rebreather mask Preoxygenation: Pre-oxygenation with 100% oxygen Induction Type: Rapid sequence Ventilation: Mask ventilation without difficulty Laryngoscope Size: Mac and 4 Tube size: 7.5 mm Number of attempts: 1 Airway Equipment and Method: Video-laryngoscopy Placement Confirmation: ETT inserted through vocal cords under direct vision, CO2 detector and Breath sounds checked- equal and bilateral Tube secured with: ETT holder Dental Injury: Teeth and Oropharynx as per pre-operative assessment  Difficulty Due To: Difficult Airway- due to reduced neck mobility

## 2022-09-09 NOTE — Progress Notes (Signed)
Orthopedic Tech Progress Note Patient Details:  Silverstreet Rrr Doe 04/13/1873 [id number]  Patient ID: Silverstreet Rrr Doe, female   DOB: 04/13/1873, 67 y.o.   MRN: [id number] Level I; not currently needed. Darleen Crocker 09/09/2022, 11:28 PM
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
## 2022-09-10 ENCOUNTER — Inpatient Hospital Stay (HOSPITAL_COMMUNITY): Payer: 59

## 2022-09-10 ENCOUNTER — Encounter (HOSPITAL_COMMUNITY): Payer: Self-pay | Admitting: Surgery

## 2022-09-10 ENCOUNTER — Other Ambulatory Visit: Payer: Self-pay

## 2022-09-10 DIAGNOSIS — W3400XA Accidental discharge from unspecified firearms or gun, initial encounter: Principal | ICD-10-CM

## 2022-09-10 DIAGNOSIS — M6281 Muscle weakness (generalized): Secondary | ICD-10-CM | POA: Diagnosis not present

## 2022-09-10 DIAGNOSIS — F1411 Cocaine abuse, in remission: Secondary | ICD-10-CM | POA: Diagnosis not present

## 2022-09-10 DIAGNOSIS — K5900 Constipation, unspecified: Secondary | ICD-10-CM | POA: Diagnosis not present

## 2022-09-10 DIAGNOSIS — G8221 Paraplegia, complete: Secondary | ICD-10-CM | POA: Diagnosis not present

## 2022-09-10 DIAGNOSIS — D62 Acute posthemorrhagic anemia: Secondary | ICD-10-CM | POA: Diagnosis not present

## 2022-09-10 DIAGNOSIS — F4320 Adjustment disorder, unspecified: Secondary | ICD-10-CM | POA: Diagnosis not present

## 2022-09-10 DIAGNOSIS — Y95 Nosocomial condition: Secondary | ICD-10-CM | POA: Diagnosis not present

## 2022-09-10 DIAGNOSIS — E785 Hyperlipidemia, unspecified: Secondary | ICD-10-CM | POA: Diagnosis present

## 2022-09-10 DIAGNOSIS — G8220 Paraplegia, unspecified: Secondary | ICD-10-CM | POA: Diagnosis not present

## 2022-09-10 DIAGNOSIS — I471 Supraventricular tachycardia, unspecified: Secondary | ICD-10-CM | POA: Diagnosis not present

## 2022-09-10 DIAGNOSIS — S31139A Puncture wound of abdominal wall without foreign body, unspecified quadrant without penetration into peritoneal cavity, initial encounter: Secondary | ICD-10-CM | POA: Diagnosis present

## 2022-09-10 DIAGNOSIS — Z7401 Bed confinement status: Secondary | ICD-10-CM | POA: Diagnosis not present

## 2022-09-10 DIAGNOSIS — S21239A Puncture wound without foreign body of unspecified back wall of thorax without penetration into thoracic cavity, initial encounter: Secondary | ICD-10-CM | POA: Diagnosis present

## 2022-09-10 DIAGNOSIS — S27321A Contusion of lung, unilateral, initial encounter: Secondary | ICD-10-CM | POA: Diagnosis not present

## 2022-09-10 DIAGNOSIS — F430 Acute stress reaction: Secondary | ICD-10-CM | POA: Diagnosis present

## 2022-09-10 DIAGNOSIS — Z87891 Personal history of nicotine dependence: Secondary | ICD-10-CM | POA: Diagnosis not present

## 2022-09-10 DIAGNOSIS — J9811 Atelectasis: Secondary | ICD-10-CM | POA: Diagnosis not present

## 2022-09-10 DIAGNOSIS — J189 Pneumonia, unspecified organism: Secondary | ICD-10-CM | POA: Diagnosis not present

## 2022-09-10 DIAGNOSIS — S24104A Unspecified injury at T11-T12 level of thoracic spinal cord, initial encounter: Secondary | ICD-10-CM | POA: Diagnosis not present

## 2022-09-10 DIAGNOSIS — J9601 Acute respiratory failure with hypoxia: Secondary | ICD-10-CM | POA: Diagnosis not present

## 2022-09-10 DIAGNOSIS — Z981 Arthrodesis status: Secondary | ICD-10-CM | POA: Diagnosis not present

## 2022-09-10 DIAGNOSIS — S3991XA Unspecified injury of abdomen, initial encounter: Secondary | ICD-10-CM | POA: Diagnosis not present

## 2022-09-10 DIAGNOSIS — I10 Essential (primary) hypertension: Secondary | ICD-10-CM | POA: Diagnosis not present

## 2022-09-10 DIAGNOSIS — F17210 Nicotine dependence, cigarettes, uncomplicated: Secondary | ICD-10-CM | POA: Diagnosis present

## 2022-09-10 DIAGNOSIS — Z7984 Long term (current) use of oral hypoglycemic drugs: Secondary | ICD-10-CM | POA: Diagnosis not present

## 2022-09-10 DIAGNOSIS — S2241XD Multiple fractures of ribs, right side, subsequent encounter for fracture with routine healing: Secondary | ICD-10-CM | POA: Diagnosis not present

## 2022-09-10 DIAGNOSIS — Z743 Need for continuous supervision: Secondary | ICD-10-CM | POA: Diagnosis not present

## 2022-09-10 DIAGNOSIS — Z23 Encounter for immunization: Secondary | ICD-10-CM | POA: Diagnosis not present

## 2022-09-10 DIAGNOSIS — J90 Pleural effusion, not elsewhere classified: Secondary | ICD-10-CM | POA: Diagnosis not present

## 2022-09-10 DIAGNOSIS — R531 Weakness: Secondary | ICD-10-CM | POA: Diagnosis not present

## 2022-09-10 DIAGNOSIS — W3400XD Accidental discharge from unspecified firearms or gun, subsequent encounter: Secondary | ICD-10-CM | POA: Diagnosis not present

## 2022-09-10 DIAGNOSIS — S270XXA Traumatic pneumothorax, initial encounter: Secondary | ICD-10-CM | POA: Diagnosis not present

## 2022-09-10 DIAGNOSIS — R509 Fever, unspecified: Secondary | ICD-10-CM | POA: Diagnosis not present

## 2022-09-10 DIAGNOSIS — F4322 Adjustment disorder with anxiety: Secondary | ICD-10-CM | POA: Diagnosis present

## 2022-09-10 DIAGNOSIS — J939 Pneumothorax, unspecified: Secondary | ICD-10-CM | POA: Diagnosis not present

## 2022-09-10 DIAGNOSIS — F32A Depression, unspecified: Secondary | ICD-10-CM | POA: Diagnosis not present

## 2022-09-10 DIAGNOSIS — N319 Neuromuscular dysfunction of bladder, unspecified: Secondary | ICD-10-CM | POA: Diagnosis not present

## 2022-09-10 DIAGNOSIS — Z79899 Other long term (current) drug therapy: Secondary | ICD-10-CM | POA: Diagnosis not present

## 2022-09-10 DIAGNOSIS — S299XXA Unspecified injury of thorax, initial encounter: Secondary | ICD-10-CM | POA: Diagnosis not present

## 2022-09-10 DIAGNOSIS — F4329 Adjustment disorder with other symptoms: Secondary | ICD-10-CM | POA: Diagnosis not present

## 2022-09-10 DIAGNOSIS — R1310 Dysphagia, unspecified: Secondary | ICD-10-CM | POA: Diagnosis not present

## 2022-09-10 DIAGNOSIS — S27322A Contusion of lung, bilateral, initial encounter: Secondary | ICD-10-CM | POA: Diagnosis not present

## 2022-09-10 DIAGNOSIS — S2241XA Multiple fractures of ribs, right side, initial encounter for closed fracture: Secondary | ICD-10-CM | POA: Diagnosis not present

## 2022-09-10 DIAGNOSIS — E119 Type 2 diabetes mellitus without complications: Secondary | ICD-10-CM | POA: Diagnosis not present

## 2022-09-10 DIAGNOSIS — S31643A Puncture wound with foreign body of abdominal wall, right lower quadrant with penetration into peritoneal cavity, initial encounter: Secondary | ICD-10-CM | POA: Diagnosis not present

## 2022-09-10 LAB — GLUCOSE, CAPILLARY
Glucose-Capillary: 139 mg/dL — ABNORMAL HIGH (ref 70–99)
Glucose-Capillary: 149 mg/dL — ABNORMAL HIGH (ref 70–99)
Glucose-Capillary: 148 mg/dL — ABNORMAL HIGH (ref 70–99)

## 2022-09-10 LAB — COMPREHENSIVE METABOLIC PANEL
ALT: 20 U/L (ref 0–44)
AST: 25 U/L (ref 15–41)
Albumin: 2.8 g/dL — ABNORMAL LOW (ref 3.5–5.0)
Alkaline Phosphatase: 60 U/L (ref 38–126)
Anion gap: 12 (ref 5–15)
BUN: 11 mg/dL (ref 8–23)
CO2: 25 mmol/L (ref 22–32)
Calcium: 9 mg/dL (ref 8.9–10.3)
Chloride: 103 mmol/L (ref 98–111)
Creatinine, Ser: 1.15 mg/dL — ABNORMAL HIGH (ref 0.44–1.00)
GFR, Estimated: 53 mL/min — ABNORMAL LOW (ref >60)
Glucose, Bld: 128 mg/dL — ABNORMAL HIGH (ref 70–99)
Potassium: 4.1 mmol/L (ref 3.5–5.1)
Sodium: 140 mmol/L (ref 135–145)
Total Bilirubin: 0.6 mg/dL (ref 0.3–1.2)
Total Protein: 7.2 g/dL (ref 6.5–8.1)

## 2022-09-10 LAB — I-STAT ARTERIAL BLOOD GAS, ED
Acid-Base Excess: 0 mmol/L (ref 0.0–2.0)
Bicarbonate: 26.7 mmol/L (ref 20.0–28.0)
Calcium, Ion: 1.24 mmol/L (ref 1.15–1.40)
HCT: 37 % (ref 36.0–46.0)
Hemoglobin: 12.6 g/dL (ref 12.0–15.0)
O2 Saturation: 95 %
Patient temperature: 93.7
Potassium: 3.9 mmol/L (ref 3.5–5.1)
Sodium: 140 mmol/L (ref 135–145)
TCO2: 28 mmol/L (ref 22–32)
pCO2 arterial: 43.9 mmHg (ref 32–48)
pH, Arterial: 7.379 (ref 7.35–7.45)
pO2, Arterial: 67 mmHg — ABNORMAL LOW (ref 83–108)

## 2022-09-10 LAB — URINALYSIS, ROUTINE W REFLEX MICROSCOPIC
Bilirubin Urine: NEGATIVE
Glucose, UA: NEGATIVE mg/dL
Ketones, ur: NEGATIVE mg/dL
Leukocytes,Ua: NEGATIVE
Nitrite: NEGATIVE
Protein, ur: NEGATIVE mg/dL
Specific Gravity, Urine: 1.03 (ref 1.005–1.030)
pH: 5 (ref 5.0–8.0)

## 2022-09-10 LAB — BASIC METABOLIC PANEL
Anion gap: 13 (ref 5–15)
BUN: 11 mg/dL (ref 8–23)
CO2: 20 mmol/L — ABNORMAL LOW (ref 22–32)
Calcium: 8.7 mg/dL — ABNORMAL LOW (ref 8.9–10.3)
Chloride: 107 mmol/L (ref 98–111)
Creatinine, Ser: 1.07 mg/dL — ABNORMAL HIGH (ref 0.44–1.00)
GFR, Estimated: 58 mL/min — ABNORMAL LOW (ref >60)
Glucose, Bld: 102 mg/dL — ABNORMAL HIGH (ref 70–99)
Potassium: 4.4 mmol/L (ref 3.5–5.1)
Sodium: 140 mmol/L (ref 135–145)

## 2022-09-10 LAB — TYPE AND SCREEN
ABO/RH(D): B POS
Antibody Screen: NEGATIVE

## 2022-09-10 LAB — CBC
HCT: 40 % (ref 36.0–46.0)
Hemoglobin: 12.4 g/dL (ref 12.0–15.0)
MCH: 23.4 pg — ABNORMAL LOW (ref 26.0–34.0)
MCHC: 31 g/dL (ref 30.0–36.0)
MCV: 75.3 fL — ABNORMAL LOW (ref 80.0–100.0)
Platelets: 202 10*3/uL (ref 150–400)
RBC: 5.31 MIL/uL — ABNORMAL HIGH (ref 3.87–5.11)
RDW: 15.5 % (ref 11.5–15.5)
WBC: 11.5 10*3/uL — ABNORMAL HIGH (ref 4.0–10.5)
nRBC: 0 % (ref 0.0–0.2)

## 2022-09-10 LAB — LACTIC ACID, PLASMA
Lactic Acid, Venous: 3.6 mmol/L (ref 0.5–1.9)
Lactic Acid, Venous: 3 mmol/L (ref 0.5–1.9)

## 2022-09-10 LAB — PHOSPHORUS: Phosphorus: 2.5 mg/dL (ref 2.5–4.6)

## 2022-09-10 LAB — ETHANOL: Alcohol, Ethyl (B): <10 mg/dL (ref <10)

## 2022-09-10 LAB — MAGNESIUM: Magnesium: 1.9 mg/dL (ref 1.7–2.4)

## 2022-09-10 LAB — HIV ANTIBODY (ROUTINE TESTING W REFLEX): HIV Screen 4th Generation wRfx: NONREACTIVE

## 2022-09-10 LAB — MRSA NEXT GEN BY PCR, NASAL: MRSA by PCR Next Gen: NOT DETECTED

## 2022-09-10 LAB — ABO/RH: ABO/RH(D): B POS

## 2022-09-10 MED ORDER — ORAL CARE MOUTH RINSE: 15.0000 mL | OROMUCOSAL | Status: DC | PRN

## 2022-09-10 MED ORDER — MIDAZOLAM-SODIUM CHLORIDE 100-0.9 MG/100ML-% IV SOLN
1.0000 mg/h | INTRAVENOUS | Status: DC
  Administered 2022-09-10: 0.5 mg/h via INTRAVENOUS
  Administered 2022-09-10: 2 mg/h via INTRAVENOUS

## 2022-09-10 MED ORDER — FENTANYL CITRATE PF 50 MCG/ML IJ SOSY
50.0000 ug | PREFILLED_SYRINGE | Freq: Once | INTRAMUSCULAR | Status: AC
  Administered 2022-09-10: 50 ug via INTRAVENOUS

## 2022-09-10 MED ORDER — PANTOPRAZOLE SODIUM 40 MG PO TBEC
40.0000 mg | DELAYED_RELEASE_TABLET | Freq: Every day | ORAL | Status: DC
  Administered 2022-09-12 – 2022-09-29 (×18): 40 mg via ORAL
  Filled 2022-09-10 (×19): qty 1

## 2022-09-10 MED ORDER — DEXMEDETOMIDINE HCL IN NACL 400 MCG/100ML IV SOLN
0.0000 ug/kg/h | INTRAVENOUS | Status: DC
  Administered 2022-09-10 (×3): 0.4 ug/kg/h via INTRAVENOUS
  Filled 2022-09-10 (×2): qty 100

## 2022-09-10 MED ORDER — PANTOPRAZOLE SODIUM 40 MG IV SOLR
40.0000 mg | Freq: Every day | INTRAVENOUS | Status: DC
  Administered 2022-09-10 – 2022-09-11 (×2): 40 mg via INTRAVENOUS
  Filled 2022-09-10 (×2): qty 10

## 2022-09-10 MED ORDER — ACETAMINOPHEN 325 MG PO TABS
650.0000 mg | ORAL_TABLET | Freq: Four times a day (QID) | ORAL | Status: DC | PRN
  Filled 2022-09-10: qty 2

## 2022-09-10 MED ORDER — MIDAZOLAM HCL 2 MG/2ML IJ SOLN: 2.0000 mg | INTRAMUSCULAR | Status: DC | PRN

## 2022-09-10 MED ORDER — HYDRALAZINE HCL 20 MG/ML IJ SOLN
10.0000 mg | INTRAMUSCULAR | Status: DC | PRN
  Filled 2022-09-10: qty 1

## 2022-09-10 MED ORDER — LACTATED RINGERS IV SOLN: INTRAVENOUS | Status: DC

## 2022-09-10 MED ORDER — ALBUMIN HUMAN 5 % IV SOLN
25.0000 g | Freq: Once | INTRAVENOUS | Status: AC
  Administered 2022-09-10: 12.5 g via INTRAVENOUS
  Filled 2022-09-10: qty 500

## 2022-09-10 MED ORDER — FENTANYL CITRATE PF 50 MCG/ML IJ SOSY
50.0000 ug | PREFILLED_SYRINGE | INTRAMUSCULAR | Status: DC | PRN
  Administered 2022-09-11 (×3): 50 ug via INTRAVENOUS

## 2022-09-10 MED ORDER — ONDANSETRON 4 MG PO TBDP: 4.0000 mg | ORAL_TABLET | Freq: Four times a day (QID) | ORAL | Status: DC | PRN

## 2022-09-10 MED ORDER — VITAL HIGH PROTEIN PO LIQD: 1000.0000 mL | ORAL | Status: DC

## 2022-09-10 MED ORDER — FENTANYL 2500MCG IN NS 250ML (10MCG/ML) PREMIX INFUSION
0.0000 ug/h | INTRAVENOUS | Status: DC
  Administered 2022-09-10: 75 ug/h via INTRAVENOUS
  Administered 2022-09-10 (×2): 50 ug/h via INTRAVENOUS
  Filled 2022-09-10: qty 250

## 2022-09-10 MED ORDER — FENTANYL CITRATE PF 50 MCG/ML IJ SOSY
PREFILLED_SYRINGE | INTRAMUSCULAR | Status: AC
  Filled 2022-09-10: qty 1

## 2022-09-10 MED ORDER — POLYETHYLENE GLYCOL 3350 17 G PO PACK
17.0000 g | PACK | Freq: Every day | ORAL | Status: DC
  Administered 2022-09-10: 17 g
  Filled 2022-09-10 (×2): qty 1

## 2022-09-10 MED ORDER — PROSOURCE TF20 ENFIT COMPATIBL EN LIQD
60.0000 mL | Freq: Every day | ENTERAL | Status: DC
  Administered 2022-09-10: 60 mL
  Filled 2022-09-10: qty 60

## 2022-09-10 MED ORDER — PIVOT 1.5 CAL PO LIQD
1000.0000 mL | ORAL | Status: DC
  Administered 2022-09-10: 1000 mL
  Filled 2022-09-10: qty 1000

## 2022-09-10 MED ORDER — CHLORHEXIDINE GLUCONATE CLOTH 2 % EX PADS
6.0000 | MEDICATED_PAD | Freq: Every day | CUTANEOUS | Status: DC
  Administered 2022-09-10 – 2022-09-26 (×16): 6 via TOPICAL

## 2022-09-10 MED ORDER — TETANUS-DIPHTH-ACELL PERTUSSIS 5-2.5-18.5 LF-MCG/0.5 IM SUSY
0.5000 mL | PREFILLED_SYRINGE | Freq: Once | INTRAMUSCULAR | Status: AC
  Administered 2022-09-10: 0.5 mL via INTRAMUSCULAR

## 2022-09-10 MED ORDER — CEFAZOLIN SODIUM-DEXTROSE 2-4 GM/100ML-% IV SOLN
2.0000 g | Freq: Once | INTRAVENOUS | Status: AC
  Administered 2022-09-10: 2 g via INTRAVENOUS
  Filled 2022-09-10: qty 100

## 2022-09-10 MED ORDER — ENOXAPARIN SODIUM 30 MG/0.3ML IJ SOSY
30.0000 mg | PREFILLED_SYRINGE | Freq: Two times a day (BID) | INTRAMUSCULAR | Status: DC
  Administered 2022-09-10 – 2022-09-29 (×39): 30 mg via SUBCUTANEOUS
  Filled 2022-09-10 (×39): qty 0.3

## 2022-09-10 MED ORDER — ORAL CARE MOUTH RINSE
15.0000 mL | OROMUCOSAL | Status: DC
  Administered 2022-09-10 – 2022-09-11 (×15): 15 mL via OROMUCOSAL

## 2022-09-10 MED ORDER — ALBUMIN HUMAN 5 % IV SOLN
12.5000 g | Freq: Once | INTRAVENOUS | Status: AC
  Administered 2022-09-10: 12.5 g via INTRAVENOUS
  Filled 2022-09-10: qty 250

## 2022-09-10 MED ORDER — ACETAMINOPHEN 325 MG PO TABS
650.0000 mg | ORAL_TABLET | Freq: Four times a day (QID) | ORAL | Status: DC | PRN
  Administered 2022-09-10 – 2022-09-11 (×4): 650 mg
  Filled 2022-09-10 (×3): qty 2

## 2022-09-10 MED ORDER — ONDANSETRON HCL 4 MG/2ML IJ SOLN
4.0000 mg | Freq: Four times a day (QID) | INTRAMUSCULAR | Status: DC | PRN
  Administered 2022-09-14 – 2022-09-29 (×3): 4 mg via INTRAVENOUS
  Filled 2022-09-10 (×3): qty 2

## 2022-09-10 MED ORDER — PROPOFOL 1000 MG/100ML IV EMUL: 0.0000 ug/kg/min | INTRAVENOUS | Status: DC

## 2022-09-10 MED ORDER — DOCUSATE SODIUM 50 MG/5ML PO LIQD
100.0000 mg | Freq: Two times a day (BID) | ORAL | Status: DC
  Administered 2022-09-10 (×2): 100 mg
  Filled 2022-09-10 (×3): qty 10

## 2022-09-10 MED ORDER — METOPROLOL TARTRATE 5 MG/5ML IV SOLN
5.0000 mg | Freq: Four times a day (QID) | INTRAVENOUS | Status: DC | PRN
  Administered 2022-09-11: 5 mg via INTRAVENOUS
  Filled 2022-09-10: qty 5
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
## 2022-09-10 NOTE — Progress Notes (Signed)
   09/10/22 1244  Spiritual Encounters  Type of Visit Initial  Care provided to: Family  Referral source Chaplain assessment  Reason for visit Trauma  OnCall Visit No  Spiritual Framework  Presenting Themes Coping tools;Community and relationships;Other (comment)  Community/Connection Friend(s)  Interventions  Spiritual Care Interventions Made Compassionate presence;Established relationship of care and support;Prayer;Encouragement  Intervention Outcomes  Outcomes Connection to spiritual care;Reduced isolation   Went to visit patient, patient resting and asleep. Youngest son Shannon [name] was present. Shannon [name] concerned about his mother. He says "this wasn't supposed to happen to her."  He doesn't understand how it could happen. Son reports that patient is normally very alert and able to communicate by nodding her head. Due to ventilator she can not communicate verbally. He asked for prayer and said his hope is in God. Because God is in control and has the last word. Prayed with patient's son. Son request a follow up visit when mother is alert.

## 2022-09-10 NOTE — Progress Notes (Signed)
RT NOTE:  Pt transported to 4N20 without event. Report given to Tim, RRT.
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
## 2022-09-10 NOTE — ED Notes (Signed)
[employer] contact with updates on status [phone], Det Katherina Right
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
## 2022-09-10 NOTE — Progress Notes (Signed)
RT NOTE:  ETT pulled back 4cm per verbal from radiology. ETT now secure @ 21cm @ teeth. XRAY ordered to verify placement.
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
## 2022-09-10 NOTE — ED Notes (Signed)
Verbal order for 50 mcg fentanyl per Dr. Nicanor Alcon

## 2022-09-10 NOTE — ED Notes (Signed)
MD Palumbo made aware of BP

## 2022-09-10 NOTE — ED Notes (Signed)
Bilateral soft wrist restraints applied.

## 2022-09-10 NOTE — TOC CAGE-AID Note (Signed)
Transition of Care [facility]) - CAGE-AID Screening   Patient Details  Name: CRYSTLE ESPITIA MRN: [id number] Date of Birth: 23-Jul-1955      Judie Bonus, RN Phone Number: 09/10/2022, 2:10 AM   Clinical Narrative:  Pt unable to participate due to intubation and sedation.  CAGE-AID Screening: Substance Abuse Screening unable to be completed due to: : Patient unable to participate

## 2022-09-10 NOTE — ED Notes (Signed)
RT at bedside to retract tube 4 cm per radiologist
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
## 2022-09-10 NOTE — Progress Notes (Signed)
ET tube pulled back to 21 cm at the lip 
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
## 2022-09-11 LAB — GLUCOSE, CAPILLARY
Glucose-Capillary: 132 mg/dL — ABNORMAL HIGH (ref 70–99)
Glucose-Capillary: 118 mg/dL — ABNORMAL HIGH (ref 70–99)
Glucose-Capillary: 112 mg/dL — ABNORMAL HIGH (ref 70–99)
Glucose-Capillary: 109 mg/dL — ABNORMAL HIGH (ref 70–99)
Glucose-Capillary: 115 mg/dL — ABNORMAL HIGH (ref 70–99)
Glucose-Capillary: 138 mg/dL — ABNORMAL HIGH (ref 70–99)

## 2022-09-11 LAB — CBC
HCT: 37.6 % (ref 36.0–46.0)
Hemoglobin: 11.5 g/dL — ABNORMAL LOW (ref 12.0–15.0)
MCH: 23.7 pg — ABNORMAL LOW (ref 26.0–34.0)
MCHC: 30.6 g/dL (ref 30.0–36.0)
MCV: 77.4 fL — ABNORMAL LOW (ref 80.0–100.0)
Platelets: 241 10*3/uL (ref 150–400)
RBC: 4.86 MIL/uL (ref 3.87–5.11)
RDW: 15.9 % — ABNORMAL HIGH (ref 11.5–15.5)
WBC: 12 10*3/uL — ABNORMAL HIGH (ref 4.0–10.5)
nRBC: 0 % (ref 0.0–0.2)

## 2022-09-11 LAB — BASIC METABOLIC PANEL
Anion gap: 6 (ref 5–15)
BUN: 18 mg/dL (ref 8–23)
CO2: 24 mmol/L (ref 22–32)
Calcium: 8.6 mg/dL — ABNORMAL LOW (ref 8.9–10.3)
Chloride: 105 mmol/L (ref 98–111)
Creatinine, Ser: 0.79 mg/dL (ref 0.44–1.00)
GFR, Estimated: >60 mL/min (ref >60)
Glucose, Bld: 124 mg/dL — ABNORMAL HIGH (ref 70–99)
Potassium: 4.1 mmol/L (ref 3.5–5.1)
Sodium: 135 mmol/L (ref 135–145)

## 2022-09-11 LAB — TRIGLYCERIDES: Triglycerides: 65 mg/dL (ref <150)

## 2022-09-11 LAB — PHOSPHORUS
Phosphorus: 2.6 mg/dL (ref 2.5–4.6)
Phosphorus: 2.1 mg/dL — ABNORMAL LOW (ref 2.5–4.6)

## 2022-09-11 LAB — MAGNESIUM
Magnesium: 2.1 mg/dL (ref 1.7–2.4)
Magnesium: 1.9 mg/dL (ref 1.7–2.4)

## 2022-09-11 MED ORDER — DOCUSATE SODIUM 100 MG PO CAPS
100.0000 mg | ORAL_CAPSULE | Freq: Two times a day (BID) | ORAL | Status: DC
  Administered 2022-09-11 – 2022-09-15 (×8): 100 mg via ORAL
  Filled 2022-09-11 (×8): qty 1

## 2022-09-11 MED ORDER — OXYCODONE HCL 5 MG PO TABS: 5.0000 mg | ORAL_TABLET | ORAL | Status: DC | PRN

## 2022-09-11 MED ORDER — GABAPENTIN 100 MG PO CAPS
100.0000 mg | ORAL_CAPSULE | Freq: Three times a day (TID) | ORAL | Status: DC
  Administered 2022-09-11 – 2022-09-14 (×8): 100 mg via ORAL
  Filled 2022-09-11 (×8): qty 1

## 2022-09-11 MED ORDER — GABAPENTIN 100 MG PO CAPS: 100.0000 mg | ORAL_CAPSULE | Freq: Three times a day (TID) | ORAL | Status: DC

## 2022-09-11 MED ORDER — GABAPENTIN 250 MG/5ML PO SOLN
100.0000 mg | Freq: Three times a day (TID) | ORAL | Status: DC
  Filled 2022-09-11 (×2): qty 2

## 2022-09-11 MED ORDER — OXYCODONE HCL 5 MG PO TABS
5.0000 mg | ORAL_TABLET | ORAL | Status: DC | PRN
  Administered 2022-09-11 – 2022-09-13 (×10): 10 mg via ORAL
  Filled 2022-09-11 (×10): qty 2

## 2022-09-11 MED ORDER — ACETAMINOPHEN 325 MG PO TABS
650.0000 mg | ORAL_TABLET | Freq: Four times a day (QID) | ORAL | Status: DC | PRN
  Administered 2022-09-11 – 2022-09-13 (×5): 650 mg via ORAL
  Filled 2022-09-11 (×5): qty 2

## 2022-09-11 MED ORDER — POLYETHYLENE GLYCOL 3350 17 G PO PACK
17.0000 g | PACK | Freq: Every day | ORAL | Status: DC
  Administered 2022-09-12 – 2022-09-14 (×3): 17 g via ORAL
  Filled 2022-09-11 (×4): qty 1

## 2022-09-11 MED ORDER — HYDROMORPHONE HCL 1 MG/ML IJ SOLN
0.5000 mg | INTRAMUSCULAR | Status: DC | PRN
  Administered 2022-09-11 – 2022-09-14 (×12): 1 mg via INTRAVENOUS
  Filled 2022-09-11 (×12): qty 1
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
## 2022-09-11 NOTE — Procedures (Signed)
Extubation Procedure Note  Patient Details:   Name: SEINI VOLLMAN DOB: May 07, 1955 MRN: [id number]   Airway Documentation:    Vent end date: 09/11/22 Vent end time: 0855   Evaluation  O2 sats: stable throughout Complications: No apparent complications Patient did tolerate procedure well. Bilateral Breath Sounds: Diminished   Yes  Positive cuff leak prior to extubation. Pt was extubated to 10L [place]. Vitals stable.  Lajean Manes 09/11/2022, 8:57 AM
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
## 2022-09-11 NOTE — Progress Notes (Signed)
75 mL fentanyl wasted in stericycle with Beryl Meager RN

## 2022-09-12 LAB — GLUCOSE, CAPILLARY
Glucose-Capillary: 104 mg/dL — ABNORMAL HIGH (ref 70–99)
Glucose-Capillary: 117 mg/dL — ABNORMAL HIGH (ref 70–99)
Glucose-Capillary: 160 mg/dL — ABNORMAL HIGH (ref 70–99)

## 2022-09-12 LAB — MAGNESIUM: Magnesium: 1.9 mg/dL (ref 1.7–2.4)

## 2022-09-12 LAB — PHOSPHORUS: Phosphorus: 2.9 mg/dL (ref 2.5–4.6)

## 2022-09-12 LAB — CBC
HCT: 31.8 % — ABNORMAL LOW (ref 36.0–46.0)
Hemoglobin: 9.9 g/dL — ABNORMAL LOW (ref 12.0–15.0)
MCH: 22.7 pg — ABNORMAL LOW (ref 26.0–34.0)
MCHC: 31.1 g/dL (ref 30.0–36.0)
MCV: 72.8 fL — ABNORMAL LOW (ref 80.0–100.0)
Platelets: 252 10*3/uL (ref 150–400)
RBC: 4.37 MIL/uL (ref 3.87–5.11)
RDW: 15.3 % (ref 11.5–15.5)
WBC: 12.3 10*3/uL — ABNORMAL HIGH (ref 4.0–10.5)
nRBC: 0 % (ref 0.0–0.2)

## 2022-09-12 LAB — BASIC METABOLIC PANEL
Anion gap: 9 (ref 5–15)
BUN: 7 mg/dL — ABNORMAL LOW (ref 8–23)
CO2: 24 mmol/L (ref 22–32)
Calcium: 8.6 mg/dL — ABNORMAL LOW (ref 8.9–10.3)
Chloride: 104 mmol/L (ref 98–111)
Creatinine, Ser: 0.75 mg/dL (ref 0.44–1.00)
GFR, Estimated: >60 mL/min (ref >60)
Glucose, Bld: 107 mg/dL — ABNORMAL HIGH (ref 70–99)
Potassium: 3.7 mmol/L (ref 3.5–5.1)
Sodium: 137 mmol/L (ref 135–145)
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
## 2022-09-12 NOTE — Progress Notes (Signed)
Inpatient Rehab Admissions Coordinator Note:   Per PT patient was screened for CIR candidacy by Tashanti Dalporto Luvenia Starch, CCC-SLP. At this time, pt has not yet attempted transfers. Pt may have potential to progress to becoming a potential CIR candidate. CIR admissions team will follow to monitor for progress and participation with therapies. It pt appears to be an appropriate candidate, a consult order will be placed.    Wolfgang Phoenix, MS, CCC-SLP Admissions Coordinator [phone] 09/12/22 6:08 PM

## 2022-09-13 LAB — CBC
HCT: 30.2 % — ABNORMAL LOW (ref 36.0–46.0)
Hemoglobin: 9.4 g/dL — ABNORMAL LOW (ref 12.0–15.0)
MCH: 22.7 pg — ABNORMAL LOW (ref 26.0–34.0)
MCHC: 31.1 g/dL (ref 30.0–36.0)
MCV: 72.8 fL — ABNORMAL LOW (ref 80.0–100.0)
Platelets: 261 10*3/uL (ref 150–400)
RBC: 4.15 MIL/uL (ref 3.87–5.11)
RDW: 15.2 % (ref 11.5–15.5)
WBC: 9.2 10*3/uL (ref 4.0–10.5)
nRBC: 0 % (ref 0.0–0.2)

## 2022-09-13 LAB — BASIC METABOLIC PANEL
Anion gap: 7 (ref 5–15)
BUN: 9 mg/dL (ref 8–23)
CO2: 25 mmol/L (ref 22–32)
Calcium: 8.3 mg/dL — ABNORMAL LOW (ref 8.9–10.3)
Chloride: 103 mmol/L (ref 98–111)
Creatinine, Ser: 0.74 mg/dL (ref 0.44–1.00)
GFR, Estimated: >60 mL/min (ref >60)
Glucose, Bld: 105 mg/dL — ABNORMAL HIGH (ref 70–99)
Potassium: 3.8 mmol/L (ref 3.5–5.1)
Sodium: 135 mmol/L (ref 135–145)

## 2022-09-13 LAB — GLUCOSE, CAPILLARY
Glucose-Capillary: 107 mg/dL — ABNORMAL HIGH (ref 70–99)
Glucose-Capillary: 120 mg/dL — ABNORMAL HIGH (ref 70–99)
Glucose-Capillary: 126 mg/dL — ABNORMAL HIGH (ref 70–99)
Glucose-Capillary: 128 mg/dL — ABNORMAL HIGH (ref 70–99)
Glucose-Capillary: 130 mg/dL — ABNORMAL HIGH (ref 70–99)
Glucose-Capillary: 138 mg/dL — ABNORMAL HIGH (ref 70–99)

## 2022-09-13 NOTE — Progress Notes (Signed)
SLP Cancellation Note  Patient Details Name: Shannon [name] MRN: [id number] DOB: 12/26/1955   Cancelled treatment:        Spoke with pt and son briefly about plans for instrumental swallowing assessment.  Pt declines both FEES and MBSS at this time. SLP will follow up for readiness for further evaluation.   Kerrie Pleasure, MA, CCC-SLP Acute Rehabilitation Services Office: [phone] 09/13/2022, 10:23 AM
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
## 2022-09-14 DIAGNOSIS — Z87891 Personal history of nicotine dependence: Secondary | ICD-10-CM | POA: Diagnosis not present

## 2022-09-14 DIAGNOSIS — F4320 Adjustment disorder, unspecified: Secondary | ICD-10-CM

## 2022-09-14 DIAGNOSIS — F430 Acute stress reaction: Secondary | ICD-10-CM | POA: Diagnosis not present

## 2022-09-14 DIAGNOSIS — F4329 Adjustment disorder with other symptoms: Secondary | ICD-10-CM | POA: Diagnosis not present

## 2022-09-14 LAB — GLUCOSE, CAPILLARY
Glucose-Capillary: 110 mg/dL — ABNORMAL HIGH (ref 70–99)
Glucose-Capillary: 117 mg/dL — ABNORMAL HIGH (ref 70–99)
Glucose-Capillary: 129 mg/dL — ABNORMAL HIGH (ref 70–99)

## 2022-09-14 LAB — EXPECTORATED SPUTUM ASSESSMENT W GRAM STAIN, RFLX TO RESP C

## 2022-09-14 MED ORDER — POLYETHYLENE GLYCOL 3350 17 G PO PACK
17.0000 g | PACK | Freq: Two times a day (BID) | ORAL | Status: DC
  Administered 2022-09-14 – 2022-09-28 (×18): 17 g via ORAL
  Filled 2022-09-14 (×23): qty 1

## 2022-09-14 MED ORDER — HYDROMORPHONE HCL 1 MG/ML IJ SOLN
0.5000 mg | INTRAMUSCULAR | Status: DC | PRN
  Administered 2022-09-14 – 2022-09-21 (×16): 1 mg via INTRAVENOUS
  Filled 2022-09-14 (×18): qty 1

## 2022-09-14 MED ORDER — GABAPENTIN 300 MG PO CAPS
300.0000 mg | ORAL_CAPSULE | Freq: Three times a day (TID) | ORAL | Status: DC
  Administered 2022-09-14 – 2022-09-17 (×11): 300 mg via ORAL
  Filled 2022-09-14 (×12): qty 1

## 2022-09-14 MED ORDER — GUAIFENESIN ER 600 MG PO TB12
600.0000 mg | ORAL_TABLET | Freq: Two times a day (BID) | ORAL | Status: DC
  Administered 2022-09-14 – 2022-09-29 (×31): 600 mg via ORAL
  Filled 2022-09-14 (×31): qty 1

## 2022-09-14 MED ORDER — ACETAMINOPHEN 500 MG PO TABS
1000.0000 mg | ORAL_TABLET | Freq: Four times a day (QID) | ORAL | Status: DC
  Administered 2022-09-14 – 2022-09-29 (×53): 1000 mg via ORAL
  Filled 2022-09-14 (×59): qty 2

## 2022-09-14 MED ORDER — TRAZODONE HCL 50 MG PO TABS
50.0000 mg | ORAL_TABLET | Freq: Every evening | ORAL | Status: DC | PRN
  Administered 2022-09-14 – 2022-09-28 (×12): 50 mg via ORAL
  Filled 2022-09-14 (×12): qty 1

## 2022-09-14 MED ORDER — MELATONIN 3 MG PO TABS
3.0000 mg | ORAL_TABLET | Freq: Every day | ORAL | Status: DC
  Administered 2022-09-14 – 2022-09-28 (×15): 3 mg via ORAL
  Filled 2022-09-14 (×15): qty 1

## 2022-09-14 MED ORDER — OXYCODONE HCL 5 MG PO TABS
10.0000 mg | ORAL_TABLET | ORAL | Status: DC | PRN
  Administered 2022-09-14 – 2022-09-23 (×37): 15 mg via ORAL
  Administered 2022-09-23: 10 mg via ORAL
  Administered 2022-09-23 – 2022-09-28 (×24): 15 mg via ORAL
  Administered 2022-09-29: 10 mg via ORAL
  Filled 2022-09-14 (×30): qty 3
  Filled 2022-09-14: qty 2
  Filled 2022-09-14 (×29): qty 3
  Filled 2022-09-14: qty 2
  Filled 2022-09-14 (×2): qty 3
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
## 2022-09-14 NOTE — Progress Notes (Signed)
? ?  Inpatient Rehab Admissions Coordinator : ? ?Per therapy recommendations, patient was screened for CIR candidacy by Artasia Thang RN MSN.  At this time patient appears to be a potential candidate for CIR. I will place a rehab consult per protocol for full assessment. Please call me with any questions. ? ?Maddisen Vought RN MSN ?Admissions Coordinator ?[phone] ?  ?
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
## 2022-09-14 NOTE — Progress Notes (Signed)
SLP Contact Note  Patient Details Name: [name] MRN: [id number] DOB: Aug 12, 1955   Cancelled treatment:        Spoke to pt this morning.  She is agreeable to instrumental swallow assessment and prefers FEES over MBSS.  Attempted evaluation immediately after disposable scopes were delivered to department; however, pt was unavailable 2/2 psych consult. SLP will reattempt as schedule permits.   Kerrie Pleasure, MA, CCC-SLP Acute Rehabilitation Services Office: [phone] 09/14/2022, 12:26 PM

## 2022-09-14 NOTE — Care Management Important Message (Signed)
Important Message  Patient Details  Name: Shannon [name] MRN: [id number] Date of Birth: 1955/12/10   Medicare Important Message Given:  Yes     Sherilyn Banker 09/14/2022, 11:30 AM
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
## 2022-09-15 ENCOUNTER — Encounter (HOSPITAL_COMMUNITY): Payer: Self-pay | Admitting: Surgery

## 2022-09-15 ENCOUNTER — Inpatient Hospital Stay (HOSPITAL_COMMUNITY): Payer: 59

## 2022-09-15 DIAGNOSIS — F4320 Adjustment disorder, unspecified: Secondary | ICD-10-CM | POA: Diagnosis not present

## 2022-09-15 DIAGNOSIS — F4329 Adjustment disorder with other symptoms: Secondary | ICD-10-CM | POA: Diagnosis not present

## 2022-09-15 DIAGNOSIS — W3400XA Accidental discharge from unspecified firearms or gun, initial encounter: Secondary | ICD-10-CM | POA: Diagnosis not present

## 2022-09-15 DIAGNOSIS — Z87891 Personal history of nicotine dependence: Secondary | ICD-10-CM | POA: Diagnosis not present

## 2022-09-15 DIAGNOSIS — F430 Acute stress reaction: Secondary | ICD-10-CM | POA: Diagnosis not present

## 2022-09-15 LAB — URINALYSIS, COMPLETE (UACMP) WITH MICROSCOPIC
Bacteria, UA: NONE SEEN
Bilirubin Urine: NEGATIVE
Glucose, UA: NEGATIVE mg/dL
Ketones, ur: NEGATIVE mg/dL
Leukocytes,Ua: NEGATIVE
Nitrite: NEGATIVE
Protein, ur: NEGATIVE mg/dL
Specific Gravity, Urine: 1.013 (ref 1.005–1.030)
pH: 7 (ref 5.0–8.0)

## 2022-09-15 LAB — BASIC METABOLIC PANEL
Anion gap: 7 (ref 5–15)
BUN: 8 mg/dL (ref 8–23)
CO2: 25 mmol/L (ref 22–32)
Calcium: 8.4 mg/dL — ABNORMAL LOW (ref 8.9–10.3)
Chloride: 103 mmol/L (ref 98–111)
Creatinine, Ser: 0.86 mg/dL (ref 0.44–1.00)
GFR, Estimated: >60 mL/min (ref >60)
Glucose, Bld: 108 mg/dL — ABNORMAL HIGH (ref 70–99)
Potassium: 4.8 mmol/L (ref 3.5–5.1)
Sodium: 135 mmol/L (ref 135–145)

## 2022-09-15 LAB — CBC
HCT: 30.3 % — ABNORMAL LOW (ref 36.0–46.0)
Hemoglobin: 9.6 g/dL — ABNORMAL LOW (ref 12.0–15.0)
MCH: 22.6 pg — ABNORMAL LOW (ref 26.0–34.0)
MCHC: 31.7 g/dL (ref 30.0–36.0)
MCV: 71.5 fL — ABNORMAL LOW (ref 80.0–100.0)
Platelets: 370 10*3/uL (ref 150–400)
RBC: 4.24 MIL/uL (ref 3.87–5.11)
RDW: 15 % (ref 11.5–15.5)
WBC: 8 10*3/uL (ref 4.0–10.5)
nRBC: 0 % (ref 0.0–0.2)

## 2022-09-15 LAB — CULTURE, RESPIRATORY W GRAM STAIN

## 2022-09-15 MED ORDER — SODIUM CHLORIDE 0.9 % IV SOLN
2.0000 g | Freq: Three times a day (TID) | INTRAVENOUS | Status: DC
  Administered 2022-09-15 – 2022-09-16 (×4): 2 g via INTRAVENOUS
  Filled 2022-09-15 (×4): qty 12.5

## 2022-09-15 MED ORDER — NICOTINE POLACRILEX 2 MG MT GUM
2.0000 mg | CHEWING_GUM | Freq: Two times a day (BID) | OROMUCOSAL | Status: DC | PRN
  Filled 2022-09-15: qty 1

## 2022-09-15 MED ORDER — BISACODYL 10 MG RE SUPP
10.0000 mg | Freq: Once | RECTAL | Status: DC
  Filled 2022-09-15: qty 1

## 2022-09-15 MED ORDER — ENSURE ENLIVE PO LIQD
237.0000 mL | Freq: Three times a day (TID) | ORAL | Status: DC
  Administered 2022-09-15 – 2022-09-29 (×34): 237 mL via ORAL

## 2022-09-15 MED ORDER — METHOCARBAMOL 750 MG PO TABS
750.0000 mg | ORAL_TABLET | Freq: Three times a day (TID) | ORAL | Status: DC
  Administered 2022-09-15 – 2022-09-21 (×19): 750 mg via ORAL
  Filled 2022-09-15 (×19): qty 1

## 2022-09-15 MED ORDER — MAGNESIUM HYDROXIDE 400 MG/5ML PO SUSP
30.0000 mL | Freq: Once | ORAL | Status: AC
  Administered 2022-09-15: 30 mL via ORAL
  Filled 2022-09-15: qty 30

## 2022-09-15 MED ORDER — SENNOSIDES-DOCUSATE SODIUM 8.6-50 MG PO TABS
1.0000 | ORAL_TABLET | Freq: Two times a day (BID) | ORAL | Status: DC
  Administered 2022-09-15 – 2022-09-28 (×21): 1 via ORAL
  Filled 2022-09-15 (×25): qty 1
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
## 2022-09-15 NOTE — Progress Notes (Signed)
Pharmacy Antibiotic Note  Shannon [name] is a 67 y.o. female admitted on 09/09/2022 with pneumonia.  Pharmacy has been consulted for cefepime dosing. SCr stable 0.74 (last 6/2).  Plan: Cefepime 2g IV q8h Monitor clinical progress, c/s, renal function F/u de-escalation plan/LOT   Height: 5\' 7"  (170.2 cm) Weight: 84.4 kg (186 lb 1.1 oz) IBW/kg (Calculated) : 61.6  Temp (24hrs), Avg:99.6 F (37.6 C), Min:98.1 F (36.7 C), Max:101.2 F (38.4 C)  Recent Labs  Lab 09/09/22 2320 09/09/22 2346 09/10/22 0430 09/10/22 0828 09/10/22 1320 09/11/22 0847 09/12/22 0649 09/13/22 0238  WBC 6.3  --  11.5*  --   --  12.0* 12.3* 9.2  CREATININE 1.15* 1.10* 1.07*  --   --  0.79 0.75 0.74  LATICACIDVEN  --   --   --  3.6* 3.0*  --   --   --     Estimated Creatinine Clearance: 79.3 mL/min (by C-G formula based on SCr of 0.74 mg/dL).    No Known Allergies  Leia Alf, PharmD, BCPS Please check AMION for all [facility] contact numbers Clinical Pharmacist 09/15/2022 11:10 AM
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
## 2022-09-16 DIAGNOSIS — F4329 Adjustment disorder with other symptoms: Secondary | ICD-10-CM

## 2022-09-16 DIAGNOSIS — F430 Acute stress reaction: Secondary | ICD-10-CM | POA: Diagnosis not present

## 2022-09-16 DIAGNOSIS — Z87891 Personal history of nicotine dependence: Secondary | ICD-10-CM | POA: Diagnosis not present

## 2022-09-16 LAB — CBC
HCT: 30.2 % — ABNORMAL LOW (ref 36.0–46.0)
Hemoglobin: 9.4 g/dL — ABNORMAL LOW (ref 12.0–15.0)
MCH: 22.2 pg — ABNORMAL LOW (ref 26.0–34.0)
MCHC: 31.1 g/dL (ref 30.0–36.0)
MCV: 71.4 fL — ABNORMAL LOW (ref 80.0–100.0)
Platelets: 407 10*3/uL — ABNORMAL HIGH (ref 150–400)
RBC: 4.23 MIL/uL (ref 3.87–5.11)
RDW: 14.7 % (ref 11.5–15.5)
WBC: 7.4 10*3/uL (ref 4.0–10.5)
nRBC: 0 % (ref 0.0–0.2)

## 2022-09-16 LAB — BASIC METABOLIC PANEL
Anion gap: 9 (ref 5–15)
BUN: 12 mg/dL (ref 8–23)
CO2: 24 mmol/L (ref 22–32)
Calcium: 8.5 mg/dL — ABNORMAL LOW (ref 8.9–10.3)
Chloride: 102 mmol/L (ref 98–111)
Creatinine, Ser: 0.69 mg/dL (ref 0.44–1.00)
GFR, Estimated: >60 mL/min (ref >60)
Glucose, Bld: 104 mg/dL — ABNORMAL HIGH (ref 70–99)
Potassium: 4.3 mmol/L (ref 3.5–5.1)
Sodium: 135 mmol/L (ref 135–145)

## 2022-09-16 LAB — GLUCOSE, CAPILLARY
Glucose-Capillary: 106 mg/dL — ABNORMAL HIGH (ref 70–99)
Glucose-Capillary: 106 mg/dL — ABNORMAL HIGH (ref 70–99)

## 2022-09-16 MED ORDER — SODIUM CHLORIDE 0.9 % IV SOLN
2.0000 g | INTRAVENOUS | Status: AC
  Administered 2022-09-16 – 2022-09-20 (×5): 2 g via INTRAVENOUS
  Filled 2022-09-16 (×6): qty 20

## 2022-09-16 MED ORDER — LIDOCAINE 5 % EX PTCH
1.0000 | MEDICATED_PATCH | CUTANEOUS | Status: DC
  Administered 2022-09-16 – 2022-09-29 (×14): 1 via TRANSDERMAL
  Filled 2022-09-16 (×13): qty 1

## 2022-09-16 MED ORDER — SORBITOL 70 % SOLN
60.0000 mL | TOPICAL_OIL | Freq: Once | ORAL | Status: AC
  Administered 2022-09-16: 60 mL via RECTAL
  Filled 2022-09-16: qty 30

## 2022-09-16 MED ORDER — FUROSEMIDE 10 MG/ML IJ SOLN
40.0000 mg | Freq: Once | INTRAMUSCULAR | Status: AC
  Administered 2022-09-16: 40 mg via INTRAVENOUS
  Filled 2022-09-16: qty 4

## 2022-09-16 NOTE — Progress Notes (Signed)
Called MD for order for trapeze bar. Verbal given and this nurse called [facility] for a delivery tonight

## 2022-09-16 NOTE — Progress Notes (Signed)
[facility] Progress Note Patient Details:  Shannon [name] 03-13-1956 [id number] Overhead frame applied to patient's bed  Patient ID: Shannon [name], female   DOB: June 19, 1955, 67 y.o.   MRN: [id number]  Smitty Pluck 09/16/2022, 7:11 PM
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
## 2022-09-17 LAB — CBC
HCT: 31 % — ABNORMAL LOW (ref 36.0–46.0)
Hemoglobin: 9.8 g/dL — ABNORMAL LOW (ref 12.0–15.0)
MCH: 22.6 pg — ABNORMAL LOW (ref 26.0–34.0)
MCHC: 31.6 g/dL (ref 30.0–36.0)
MCV: 71.6 fL — ABNORMAL LOW (ref 80.0–100.0)
Platelets: 493 10*3/uL — ABNORMAL HIGH (ref 150–400)
RBC: 4.33 MIL/uL (ref 3.87–5.11)
RDW: 15 % (ref 11.5–15.5)
WBC: 8.4 10*3/uL (ref 4.0–10.5)
nRBC: 0 % (ref 0.0–0.2)

## 2022-09-17 LAB — BASIC METABOLIC PANEL
Anion gap: 10 (ref 5–15)
BUN: 16 mg/dL (ref 8–23)
CO2: 25 mmol/L (ref 22–32)
Calcium: 8.6 mg/dL — ABNORMAL LOW (ref 8.9–10.3)
Chloride: 100 mmol/L (ref 98–111)
Creatinine, Ser: 0.81 mg/dL (ref 0.44–1.00)
GFR, Estimated: >60 mL/min (ref >60)
Glucose, Bld: 109 mg/dL — ABNORMAL HIGH (ref 70–99)
Potassium: 4.4 mmol/L (ref 3.5–5.1)
Sodium: 135 mmol/L (ref 135–145)

## 2022-09-17 MED ORDER — IPRATROPIUM-ALBUTEROL 0.5-2.5 (3) MG/3ML IN SOLN: 3.0000 mL | Freq: Four times a day (QID) | RESPIRATORY_TRACT | Status: DC | PRN
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
## 2022-09-18 MED ORDER — GABAPENTIN 300 MG PO CAPS
600.0000 mg | ORAL_CAPSULE | Freq: Two times a day (BID) | ORAL | Status: DC
  Administered 2022-09-18 – 2022-09-21 (×7): 600 mg via ORAL
  Filled 2022-09-18 (×7): qty 2
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
## 2022-09-19 NOTE — Plan of Care (Signed)
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
## 2022-09-20 NOTE — Plan of Care (Signed)
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
## 2022-09-21 LAB — GLUCOSE, CAPILLARY
Glucose-Capillary: 126 mg/dL — ABNORMAL HIGH (ref 70–99)
Glucose-Capillary: 152 mg/dL — ABNORMAL HIGH (ref 70–99)

## 2022-09-21 MED ORDER — ORAL CARE MOUTH RINSE: 15.0000 mL | OROMUCOSAL | Status: DC | PRN

## 2022-09-21 MED ORDER — METHOCARBAMOL 500 MG PO TABS
1000.0000 mg | ORAL_TABLET | Freq: Three times a day (TID) | ORAL | Status: DC
  Administered 2022-09-21 – 2022-09-29 (×25): 1000 mg via ORAL
  Filled 2022-09-21 (×25): qty 2

## 2022-09-21 MED ORDER — GABAPENTIN 300 MG PO CAPS
600.0000 mg | ORAL_CAPSULE | Freq: Three times a day (TID) | ORAL | Status: DC
  Administered 2022-09-21 – 2022-09-29 (×25): 600 mg via ORAL
  Filled 2022-09-21 (×25): qty 2

## 2022-09-21 MED ORDER — HYDROMORPHONE HCL 1 MG/ML IJ SOLN
0.5000 mg | Freq: Four times a day (QID) | INTRAMUSCULAR | Status: DC | PRN
  Administered 2022-09-22 – 2022-09-23 (×4): 0.5 mg via INTRAVENOUS
  Filled 2022-09-21 (×4): qty 0.5

## 2022-09-21 MED ORDER — TRAMADOL HCL 50 MG PO TABS
50.0000 mg | ORAL_TABLET | Freq: Four times a day (QID) | ORAL | Status: DC
  Administered 2022-09-21 – 2022-09-29 (×31): 50 mg via ORAL
  Filled 2022-09-21 (×34): qty 1
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
## 2022-09-21 NOTE — Plan of Care (Signed)

## 2022-09-21 NOTE — TOC Progression Note (Signed)
Transition of Care [facility]) - Progression Note    Patient Details  Name: Shannon [name] MRN: [id number] Date of Birth: [birth year]  [facility] CM/SW Contact  Glennon Mac, RN Phone Number: 09/21/2022, 5:07 PM  Clinical Narrative:    PASSR number received for SNF.  Patient has one skilled nursing facility considering admission, but no firm offer.  Noted that patient does not have any support lined up at discharge for potential CIR stay.  Will continue to follow for possible bed offers.   Expected Discharge Plan: IP Rehab Facility Barriers to Discharge: Continued Medical Work up, Conservator, museum/gallery and Services   Discharge Planning Services: CM Consult   Living arrangements for the past 2 months: Apartment                                       Social Determinants of Health (SDOH) Interventions SDOH Screenings   Tobacco Use: High Risk (09/15/2022)    Readmission Risk Interventions     No data to display         Quintella Baton, RN, BSN  Trauma/Neuro ICU Case Manager [phone]

## 2022-09-21 NOTE — Care Management Important Message (Signed)
Important Message  Patient Details  Name: Shannon [name] MRN: [id number] Date of Birth: 1956/01/30   Medicare Important Message Given:  Yes     Sherilyn Banker 09/21/2022, 2:49 PM

## 2022-09-22 LAB — GLUCOSE, CAPILLARY: Glucose-Capillary: 115 mg/dL — ABNORMAL HIGH (ref 70–99)

## 2022-09-22 MED ORDER — IBUPROFEN 200 MG PO TABS
800.0000 mg | ORAL_TABLET | Freq: Three times a day (TID) | ORAL | Status: AC
  Administered 2022-09-22 – 2022-09-24 (×9): 800 mg via ORAL
  Filled 2022-09-22 (×9): qty 4

## 2022-09-22 MED ORDER — BISACODYL 10 MG RE SUPP
10.0000 mg | Freq: Once | RECTAL | Status: AC
  Administered 2022-09-22: 10 mg via RECTAL
  Filled 2022-09-22: qty 1

## 2022-09-22 MED ORDER — ADULT MULTIVITAMIN W/MINERALS CH
1.0000 | ORAL_TABLET | Freq: Every day | ORAL | Status: DC
  Administered 2022-09-22 – 2022-09-29 (×8): 1
  Filled 2022-09-22 (×8): qty 1
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
## 2022-09-22 NOTE — Progress Notes (Signed)
Inpatient Rehab Admissions Coordinator:    I spoke with pt.'s sister Janelle Floor over the phone regarding support at d/c after CIR. Janelle Floor states she is not able to take Pt. In and provide care but suggested that Pt. Could go to [facility] in [place], as she works there. She stated that she did not think that Pt.'s other siblings could let Pt. Live with them and provide care, but she gave me phone numbers for Pt.'s sister Darral Dash and Philomena Course to discuss. I placed calls and left voicemails with request for callback.   Megan Salon, MS, CCC-SLP Rehab Admissions Coordinator  [phone] (celll) [phone] (office)

## 2022-09-22 NOTE — TOC Progression Note (Signed)
Transition of Care [facility]) - Progression Note    Patient Details  Name: Shannon [name] MRN: [id number] Date of Birth: 03-Apr-1956  Transition of [facility]) CM/SW Contact  Glennon Mac, RN Phone Number: 09/22/2022, 12:30pm  Clinical Narrative:    Patient still has no bed offers for skilled nursing facility.  Suggested to patient to consider another inpatient rehab facility, such as [facility] in [place]; she is reluctantly agreeable to this, as she is not familiar with this area.  Patient agrees to her information being faxed to [facility] inpatient rehab for consideration for possible admission.  Faxed initial referral to [facility] at facility; fax #[phone].  Mary states Crystal Jarold Motto will be following up with this patient referral; facility phone #[phone].   Expected Discharge Plan: IP Rehab Facility Barriers to Discharge: Continued Medical Work up, Conservator, museum/gallery and Services   Discharge Planning Services: CM Consult   Living arrangements for the past 2 months: Apartment                                       Social Determinants of Health (SDOH) Interventions SDOH Screenings   Tobacco Use: High Risk (09/15/2022)    Readmission Risk Interventions     No data to display         Quintella Baton, RN, BSN  Trauma/Neuro ICU Case Manager [phone]

## 2022-09-23 NOTE — TOC Progression Note (Addendum)
Transition of Care [facility]) - Progression Note    Patient Details  Name: Shannon [name] MRN: [id number] Date of Birth: 14-Jul-1955  Transition of Care [facility]) CM/SW Contact  Glennon Mac, RN Phone Number: 09/23/2022, 2:33 PM  Clinical Narrative:    Patient was evaluated by [facility], but was declined due to decreased tolerance with therapies and lack of caregiver support.  Notified by Ruthe Mannan in admissions at facility.   Addendum: 4:22pm [facility] "considering" patient; requesting clinical review.  Spoke with Brianna in admissions at facility; she is checking with her director, and will call TOC CM back.    Expected Discharge Plan: IP Rehab Facility Barriers to Discharge: Continued Medical Work up, Conservator, museum/gallery and Services   Discharge Planning Services: CM Consult   Living arrangements for the past 2 months: Apartment                                       Social Determinants of Health (SDOH) Interventions SDOH Screenings   Tobacco Use: High Risk (09/15/2022)    Readmission Risk Interventions     No data to display         Quintella Baton, RN, BSN  Trauma/Neuro ICU Case Manager [phone]

## 2022-09-23 NOTE — Progress Notes (Signed)
Orthopedic Tech Progress Note Patient Details:  Shannon [name] Jan 03, 1956 [id number]  Ortho Devices Type of Ortho Device: Prafo boot/shoe Ortho Device/Splint Location: BLE Ortho Device/Splint Interventions: Ordered, Application, Adjustment   Post Interventions Patient Tolerated: Well Instructions Provided: Care of device  Donald Pore 09/23/2022, 12:52 PM
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
## 2022-09-23 NOTE — Progress Notes (Signed)
PT Cancellation Note  Patient Details Name: Shannon [name] MRN: [id number] DOB: July 11, 1955   Cancelled Treatment:    Reason Eval/Treat Not Completed: Fatigue/lethargy limiting ability to participate; patient reports not sleeping well at night and requests PT return tomorrow.  Will follow up.    [name] 09/23/2022, 2:20 PM Sheran Lawless, PT Acute Rehabilitation Services Office:[phone] 09/23/2022

## 2022-09-23 NOTE — Plan of Care (Signed)

## 2022-09-24 LAB — GLUCOSE, CAPILLARY: Glucose-Capillary: 108 mg/dL — ABNORMAL HIGH (ref 70–99)

## 2022-09-24 LAB — CREATININE, SERUM
Creatinine, Ser: 0.92 mg/dL (ref 0.44–1.00)
GFR, Estimated: >60 mL/min (ref >60)

## 2022-09-24 MED ORDER — IBUPROFEN 200 MG PO TABS
ORAL_TABLET | ORAL | Status: AC
  Filled 2022-09-24: qty 1
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
## 2022-09-24 NOTE — Plan of Care (Signed)
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
## 2022-09-24 NOTE — Progress Notes (Signed)
Pt oxygen sat 85-88% on room air, pt informed she needs to wear her nasal cannula, pt reports "I can breathe", "I can breathe fine", pt educated on need to place oxygen via nasal cannula for oxygen sat greater than or equal to 92%, pt continues to refuse oxygen.

## 2022-09-24 NOTE — Discharge Instructions (Signed)
RIB FRACTURES  HOME INSTRUCTIONS   PAIN CONTROL:  Pain is best controlled by a usual combination of three different methods TOGETHER:  Ice/Heat Over the counter pain medication Prescription pain medication You may experience some swelling and bruising in area of broken ribs. Ice packs or heating pads (30-60 minutes up to 6 times a day) will help. Use ice for the first few days to help decrease swelling and bruising, then switch to heat to help relax tight/sore spots and speed recovery. Some people prefer to use ice alone, heat alone, alternating between ice & heat. Experiment to what works for you. Swelling and bruising can take several weeks to resolve.  It is helpful to take an over-the-counter pain medication regularly for the first few weeks. Choose one of the following that works best for you:  Naproxen (Aleve, etc) Two 220mg tabs twice a day Ibuprofen (Advil, etc) Three 200mg tabs four times a day (every meal & bedtime) Acetaminophen (Tylenol, etc) 500-650mg four times a day (every meal & bedtime) A prescription for pain medication (such as oxycodone, hydrocodone, etc) may be given to you upon discharge. Take your pain medication as prescribed.  If you are having problems/concerns with the prescription medicine (does not control pain, nausea, vomiting, rash, itching, etc), please call us [phone] to see if we need to switch you to a different pain medicine that will work better for you and/or control your side effect better. If you need a refill on your pain medication, please contact your pharmacy. They will contact our office to request authorization. Prescriptions will not be filled after 5 pm or on week-ends. Avoid getting constipated. When taking pain medications, it is common to experience some constipation. Increasing fluid intake and taking a fiber supplement (such as Metamucil, Citrucel, FiberCon, MiraLax, etc) 1-2 times a day regularly will usually help prevent this problem  from occurring. A mild laxative (prune juice, Milk of Magnesia, MiraLax, etc) should be taken according to package directions if there are no bowel movements after 48 hours.  Watch out for diarrhea. If you have many loose bowel movements, simplify your diet to bland foods & liquids for a few days. Stop any stool softeners and decrease your fiber supplement. Switching to mild anti-diarrheal medications (Kayopectate, Pepto Bismol) can help. If this worsens or does not improve, please call us. FOLLOW UP  If a follow up appointment is needed one will be scheduled for you. If none is needed with our trauma team, please follow up with your primary care provider within 2-3 weeks from discharge. Please call [facility] at [phone] if you have any questions about follow up.  If you have any orthopedic or other injuries you will need to follow up as outlined in your follow up instructions.   WHEN TO CALL US [phone]:  Poor pain control Reactions / problems with new medications (rash/itching, nausea, etc)  Fever over 101.5 F (38.5 C) Worsening swelling or bruising Worsening pain, productive cough, difficulty breathing or any other concerning symptoms  The clinic staff is available to answer your questions during regular business hours (8:30am-5pm). Please don't hesitate to call and ask to speak to one of our nurses for clinical concerns.  If you have a medical emergency, go to the nearest emergency room or call 911.  A surgeon from [facility] is always on call at the hospitals   [facility]  [address], [address], [place], [place] [address] ?  MAIN: [phone] ? TOLL FREE: [phone] ?  FAX [phone]  [email or web address]      Information on Rib Fractures  A rib fracture is a break or crack in one of the bones of the ribs. The ribs are long, curved bones that wrap around your chest and attach to your spine and your breastbone. The  ribs protect your heart, lungs, and other organs in the chest. A broken or cracked rib is often painful but is not usually serious. Most rib fractures heal on their own over time. However, rib fractures can be more serious if multiple ribs are broken or if broken ribs move out of place and push against other structures or organs. What are the causes? This condition is caused by: Repetitive movements with high force, such as pitching a baseball or having severe coughing spells. A direct blow to the chest, such as a sports injury, a car accident, or a fall. Cancer that has spread to the bones, which can weaken bones and cause them to break. What are the signs or symptoms? Symptoms of this condition include: Pain when you breathe in or cough. Pain when someone presses on the injured area. Feeling short of breath. How is this diagnosed? This condition is diagnosed with a physical exam and medical history. Imaging tests may also be done, such as: Chest X-ray. CT scan. MRI. Bone scan. Chest ultrasound. How is this treated? Treatment for this condition depends on the severity of the fracture. Most rib fractures usually heal on their own in 1-3 months. Sometimes healing takes longer if there is a cough that does not stop or if there are other activities that make the injury worse (aggravating factors). While you heal, you will be given medicines to control the pain. You will also be taught deep breathing exercises. Severe injuries may require hospitalization or surgery. Follow these instructions at home: Managing pain, stiffness, and swelling If directed, apply ice to the injured area. Put ice in a plastic bag. Place a towel between your skin and the bag. Leave the ice on for 20 minutes, 2-3 times a day. Take over-the-counter and prescription medicines only as told by your health care provider. Activity Avoid a lot of activity and any activities or movements that cause pain. Be careful during  activities and avoid bumping the injured rib. Slowly increase your activity as told by your health care provider. General instructions Do deep breathing exercises as told by your health care provider. This helps prevent pneumonia, which is a common complication of a broken rib. Your health care provider may instruct you to: Take deep breaths several times a day. Try to cough several times a day, holding a pillow against the injured area. Use a device called incentive spirometer to practice deep breathing several times a day. Drink enough fluid to keep your urine pale yellow. Do not wear a rib belt or binder. These restrict breathing, which can lead to pneumonia. Keep all follow-up visits as told by your health care provider. This is important. Contact a health care provider if: You have a fever. Get help right away if: You have difficulty breathing or you are short of breath. You develop a cough that does not stop, or you cough up thick or bloody sputum. You have nausea, vomiting, or pain in your abdomen. Your pain gets worse and medicine does not help. Summary A rib fracture is a break or crack in one of the bones of the ribs. A broken or cracked rib is   often painful but is not usually serious. Most rib fractures heal on their own over time. Treatment for this condition depends on the severity of the fracture. Avoid a lot of activity and any activities or movements that cause pain. This information is not intended to replace advice given to you by your health care provider. Make sure you discuss any questions you have with your health care provider. Document Released: 03/30/2005 Document Revised: 06/29/2016 Document Reviewed: 06/29/2016 Elsevier Interactive Patient Education  2019 [employer]vier Inc[employer]  
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
## 2022-09-24 NOTE — TOC Progression Note (Signed)
Transition of Care [facility]) - Progression Note    Patient Details  Name: Shannon [name] MRN: [id number] Date of Birth: 1955/11/09  Transition of Care [facility]) CM/SW Contact  Glennon Mac, RN Phone Number: 09/24/2022, 5:12 PM  Clinical Narrative:    Patient has no SNF bed offers; faxed patient information out to a larger area in hopes of potential offer.    Expected Discharge Plan: IP Rehab Facility Barriers to Discharge: Continued Medical Work up, Conservator, museum/gallery and Services   Discharge Planning Services: CM Consult   Living arrangements for the past 2 months: Apartment                                       Social Determinants of Health (SDOH) Interventions SDOH Screenings   Tobacco Use: High Risk (09/15/2022)    Readmission Risk Interventions     No data to display         Quintella Baton, RN, BSN  Trauma/Neuro ICU Case Manager [phone]
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
## 2022-09-25 NOTE — TOC Progression Note (Signed)
Transition of Care [facility]) - Progression Note    Patient Details  Name: Shannon [name] MRN: [id number] Date of Birth: October 21, 1955  [facility] CM/SW Contact  Glennon Mac, RN Phone Number: 09/25/2022, 5:13 PM  Clinical Narrative:    Patient given bed offers for SNF; she chooses [facility].  Spoke with Corlis Hove at facility; she states she will not have a bed available until early next week.  Will initiate insurance authorization Monday a.m. with plan for discharge Monday/Tuesday.   Expected Discharge Plan: IP Rehab Facility Barriers to Discharge: Continued Medical Work up, Conservator, museum/gallery and Services   Discharge Planning Services: CM Consult   Living arrangements for the past 2 months: Apartment                                       Social Determinants of Health (SDOH) Interventions SDOH Screenings   Tobacco Use: High Risk (09/15/2022)    Readmission Risk Interventions     No data to display         Quintella Baton, RN, BSN  Trauma/Neuro ICU Case Manager [phone]
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
## 2022-09-27 NOTE — Progress Notes (Signed)
Attempted to wean pt to room air - pt able to maintain >90% on room air when awake. SpO2 84-88% on room air when pt sleeping.  Will continue to wean and reinforce IS use.

## 2022-09-28 MED ORDER — FAMOTIDINE 20 MG PO TABS
20.0000 mg | ORAL_TABLET | Freq: Every day | ORAL | Status: DC
  Administered 2022-09-28: 20 mg via ORAL
  Filled 2022-09-28: qty 1

## 2022-09-28 MED ORDER — CALCIUM CARBONATE ANTACID 500 MG PO CHEW
2.0000 | CHEWABLE_TABLET | Freq: Three times a day (TID) | ORAL | Status: DC | PRN
  Administered 2022-09-28: 400 mg via ORAL
  Filled 2022-09-28: qty 2
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
## 2022-09-28 NOTE — TOC Progression Note (Signed)
Transition of Care [facility]) - Progression Note    Patient Details  Name: Shannon [name] MRN: [id number] Date of Birth: 07/06/1955  [facility] CM/SW Contact  Glennon Mac, RN Phone Number: 09/28/2022, 10:22am  Clinical Narrative:    Spoke with Corlis Hove in admissions at [facility]; she states that she needs to confirm that patient's Medicaid will allow patient to change to long-term, if needed after 30 day rehab.  Nursing Director and [employer] are out today, per Ms. Excell Seltzer; she states she will have an answer in the AM.     Expected Discharge Plan: Skilled Nursing Facility Barriers to Discharge: Continued Medical Work up, Conservator, museum/gallery and Services   Discharge Planning Services: CM Consult   Living arrangements for the past 2 months: Apartment                                       Social Determinants of Health (SDOH) Interventions SDOH Screenings   Tobacco Use: High Risk (09/15/2022)    Readmission Risk Interventions     No data to display         Quintella Baton, RN, BSN  Trauma/Neuro ICU Case Manager [phone]

## 2022-09-29 DIAGNOSIS — I10 Essential (primary) hypertension: Secondary | ICD-10-CM | POA: Diagnosis not present

## 2022-09-29 DIAGNOSIS — R5381 Other malaise: Secondary | ICD-10-CM | POA: Diagnosis not present

## 2022-09-29 DIAGNOSIS — S2241XD Multiple fractures of ribs, right side, subsequent encounter for fracture with routine healing: Secondary | ICD-10-CM | POA: Diagnosis not present

## 2022-09-29 DIAGNOSIS — W3400XA Accidental discharge from unspecified firearms or gun, initial encounter: Secondary | ICD-10-CM | POA: Diagnosis not present

## 2022-09-29 DIAGNOSIS — W19XXXA Unspecified fall, initial encounter: Secondary | ICD-10-CM | POA: Diagnosis not present

## 2022-09-29 DIAGNOSIS — F32A Depression, unspecified: Secondary | ICD-10-CM | POA: Diagnosis not present

## 2022-09-29 DIAGNOSIS — Z87891 Personal history of nicotine dependence: Secondary | ICD-10-CM | POA: Diagnosis not present

## 2022-09-29 DIAGNOSIS — G8221 Paraplegia, complete: Secondary | ICD-10-CM | POA: Diagnosis not present

## 2022-09-29 DIAGNOSIS — K5900 Constipation, unspecified: Secondary | ICD-10-CM | POA: Diagnosis not present

## 2022-09-29 DIAGNOSIS — Z7401 Bed confinement status: Secondary | ICD-10-CM | POA: Diagnosis not present

## 2022-09-29 DIAGNOSIS — R6889 Other general symptoms and signs: Secondary | ICD-10-CM | POA: Diagnosis not present

## 2022-09-29 DIAGNOSIS — D649 Anemia, unspecified: Secondary | ICD-10-CM | POA: Diagnosis not present

## 2022-09-29 DIAGNOSIS — R109 Unspecified abdominal pain: Secondary | ICD-10-CM | POA: Diagnosis not present

## 2022-09-29 DIAGNOSIS — R600 Localized edema: Secondary | ICD-10-CM | POA: Diagnosis not present

## 2022-09-29 DIAGNOSIS — M6281 Muscle weakness (generalized): Secondary | ICD-10-CM | POA: Diagnosis not present

## 2022-09-29 DIAGNOSIS — R0602 Shortness of breath: Secondary | ICD-10-CM | POA: Diagnosis not present

## 2022-09-29 DIAGNOSIS — G894 Chronic pain syndrome: Secondary | ICD-10-CM | POA: Diagnosis not present

## 2022-09-29 DIAGNOSIS — J9601 Acute respiratory failure with hypoxia: Secondary | ICD-10-CM | POA: Diagnosis not present

## 2022-09-29 DIAGNOSIS — S2241XA Multiple fractures of ribs, right side, initial encounter for closed fracture: Secondary | ICD-10-CM | POA: Diagnosis not present

## 2022-09-29 DIAGNOSIS — N390 Urinary tract infection, site not specified: Secondary | ICD-10-CM | POA: Diagnosis not present

## 2022-09-29 DIAGNOSIS — Z743 Need for continuous supervision: Secondary | ICD-10-CM | POA: Diagnosis not present

## 2022-09-29 DIAGNOSIS — Z23 Encounter for immunization: Secondary | ICD-10-CM | POA: Diagnosis not present

## 2022-09-29 DIAGNOSIS — G8250 Quadriplegia, unspecified: Secondary | ICD-10-CM | POA: Diagnosis not present

## 2022-09-29 DIAGNOSIS — G8929 Other chronic pain: Secondary | ICD-10-CM | POA: Diagnosis not present

## 2022-09-29 DIAGNOSIS — S31643A Puncture wound with foreign body of abdominal wall, right lower quadrant with penetration into peritoneal cavity, initial encounter: Secondary | ICD-10-CM | POA: Diagnosis not present

## 2022-09-29 DIAGNOSIS — E119 Type 2 diabetes mellitus without complications: Secondary | ICD-10-CM | POA: Diagnosis not present

## 2022-09-29 DIAGNOSIS — R1310 Dysphagia, unspecified: Secondary | ICD-10-CM | POA: Diagnosis not present

## 2022-09-29 DIAGNOSIS — Y95 Nosocomial condition: Secondary | ICD-10-CM | POA: Diagnosis not present

## 2022-09-29 DIAGNOSIS — N319 Neuromuscular dysfunction of bladder, unspecified: Secondary | ICD-10-CM | POA: Diagnosis not present

## 2022-09-29 DIAGNOSIS — R531 Weakness: Secondary | ICD-10-CM | POA: Diagnosis not present

## 2022-09-29 DIAGNOSIS — R609 Edema, unspecified: Secondary | ICD-10-CM | POA: Diagnosis not present

## 2022-09-29 DIAGNOSIS — D62 Acute posthemorrhagic anemia: Secondary | ICD-10-CM | POA: Diagnosis not present

## 2022-09-29 DIAGNOSIS — R238 Other skin changes: Secondary | ICD-10-CM | POA: Diagnosis not present

## 2022-09-29 DIAGNOSIS — R1084 Generalized abdominal pain: Secondary | ICD-10-CM | POA: Diagnosis present

## 2022-09-29 DIAGNOSIS — S24104A Unspecified injury at T11-T12 level of thoracic spinal cord, initial encounter: Secondary | ICD-10-CM | POA: Diagnosis not present

## 2022-09-29 DIAGNOSIS — E8809 Other disorders of plasma-protein metabolism, not elsewhere classified: Secondary | ICD-10-CM | POA: Diagnosis not present

## 2022-09-29 DIAGNOSIS — R8290 Unspecified abnormal findings in urine: Secondary | ICD-10-CM | POA: Diagnosis not present

## 2022-09-29 DIAGNOSIS — S27322A Contusion of lung, bilateral, initial encounter: Secondary | ICD-10-CM | POA: Diagnosis not present

## 2022-09-29 DIAGNOSIS — W3400XD Accidental discharge from unspecified firearms or gun, subsequent encounter: Secondary | ICD-10-CM | POA: Diagnosis not present

## 2022-09-29 DIAGNOSIS — Z741 Need for assistance with personal care: Secondary | ICD-10-CM | POA: Diagnosis not present

## 2022-09-29 DIAGNOSIS — K8020 Calculus of gallbladder without cholecystitis without obstruction: Secondary | ICD-10-CM | POA: Diagnosis not present

## 2022-09-29 DIAGNOSIS — R52 Pain, unspecified: Secondary | ICD-10-CM | POA: Diagnosis not present

## 2022-09-29 DIAGNOSIS — J939 Pneumothorax, unspecified: Secondary | ICD-10-CM | POA: Diagnosis not present

## 2022-09-29 DIAGNOSIS — K219 Gastro-esophageal reflux disease without esophagitis: Secondary | ICD-10-CM | POA: Diagnosis not present

## 2022-09-29 DIAGNOSIS — K6289 Other specified diseases of anus and rectum: Secondary | ICD-10-CM | POA: Diagnosis not present

## 2022-09-29 DIAGNOSIS — I471 Supraventricular tachycardia, unspecified: Secondary | ICD-10-CM | POA: Diagnosis not present

## 2022-09-29 DIAGNOSIS — R0902 Hypoxemia: Secondary | ICD-10-CM | POA: Diagnosis not present

## 2022-09-29 DIAGNOSIS — K9289 Other specified diseases of the digestive system: Secondary | ICD-10-CM | POA: Diagnosis not present

## 2022-09-29 DIAGNOSIS — J189 Pneumonia, unspecified organism: Secondary | ICD-10-CM | POA: Diagnosis not present

## 2022-09-29 DIAGNOSIS — R1033 Periumbilical pain: Secondary | ICD-10-CM | POA: Diagnosis not present

## 2022-09-29 DIAGNOSIS — S270XXA Traumatic pneumothorax, initial encounter: Secondary | ICD-10-CM | POA: Diagnosis not present

## 2022-09-29 LAB — BASIC METABOLIC PANEL
Anion gap: 9 (ref 5–15)
BUN: 19 mg/dL (ref 8–23)
CO2: 25 mmol/L (ref 22–32)
Calcium: 8.9 mg/dL (ref 8.9–10.3)
Chloride: 100 mmol/L (ref 98–111)
Creatinine, Ser: 0.78 mg/dL (ref 0.44–1.00)
GFR, Estimated: >60 mL/min (ref >60)
Glucose, Bld: 134 mg/dL — ABNORMAL HIGH (ref 70–99)
Potassium: 5.1 mmol/L (ref 3.5–5.1)
Sodium: 134 mmol/L — ABNORMAL LOW (ref 135–145)

## 2022-09-29 MED ORDER — TRAMADOL HCL 50 MG PO TABS: 50.0000 mg | ORAL_TABLET | Freq: Four times a day (QID) | ORAL | 0 refills | Status: AC

## 2022-09-29 MED ORDER — ENOXAPARIN SODIUM 30 MG/0.3ML IJ SOSY: 30.0000 mg | PREFILLED_SYRINGE | Freq: Two times a day (BID) | INTRAMUSCULAR | Status: AC

## 2022-09-29 MED ORDER — METHOCARBAMOL 1000 MG PO TABS: 1000.0000 mg | ORAL_TABLET | Freq: Three times a day (TID) | ORAL | Status: AC

## 2022-09-29 MED ORDER — SENNOSIDES-DOCUSATE SODIUM 8.6-50 MG PO TABS: 1.0000 | ORAL_TABLET | Freq: Two times a day (BID) | ORAL | Status: AC

## 2022-09-29 MED ORDER — OXYCODONE HCL 10 MG PO TABS: 10.0000 mg | ORAL_TABLET | ORAL | 0 refills | Status: AC | PRN

## 2022-09-29 MED ORDER — PANTOPRAZOLE SODIUM 40 MG PO TBEC: 40.0000 mg | DELAYED_RELEASE_TABLET | Freq: Every day | ORAL | Status: AC

## 2022-09-29 MED ORDER — POLYETHYLENE GLYCOL 3350 17 G PO PACK: 17.0000 g | PACK | Freq: Two times a day (BID) | ORAL | 0 refills | Status: AC

## 2022-09-29 MED ORDER — GUAIFENESIN ER 600 MG PO TB12: 600.0000 mg | ORAL_TABLET | Freq: Two times a day (BID) | ORAL | Status: AC

## 2022-09-29 MED ORDER — GABAPENTIN 300 MG PO CAPS: 600.0000 mg | ORAL_CAPSULE | Freq: Three times a day (TID) | ORAL | Status: AC

## 2022-09-29 MED ORDER — ACETAMINOPHEN 500 MG PO TABS: 1000.0000 mg | ORAL_TABLET | Freq: Four times a day (QID) | ORAL | 0 refills | Status: AC

## 2022-09-29 MED ORDER — NICOTINE POLACRILEX 2 MG MT GUM: 2.0000 mg | CHEWING_GUM | Freq: Two times a day (BID) | OROMUCOSAL | 0 refills | Status: AC | PRN

## 2022-09-29 MED ORDER — LIDOCAINE 5 % EX PTCH: 1.0000 | MEDICATED_PATCH | CUTANEOUS | 0 refills | Status: AC
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
## 2022-09-29 NOTE — TOC Transition Note (Signed)
Transition of Care [facility]) - CM/SW Discharge Note   Patient Details  Name: Shannon [name] MRN: [id number] Date of Birth: 1956/03/28  Transition [facility] Care [facility] CM/SW Contact:  Glennon Mac, RN Phone Number: 09/29/2022, 11:58am  Clinical Narrative:    Patient has excepted bed at [facility] and insurance authorization has been received.  Patient approved 09/29/2022 to 10/01/2022; next review date is 10/01/2022.  Authorization number is [id number].  Notified provider of insurance approval; will plan for discharge today.  Patient notified; she is agreeable to discharge plan, though somewhat reluctant.  Patient discharging to room 35 -B at facility; bedside nurse will need to call report to [phone].  Discharge summary and transfer report forwarded to facility.  PTAR notified for transport at 3:10 PM.   Final next level of care: Skilled Nursing Facility Barriers to Discharge: Barriers Resolved   Patient Goals and CMS Choice CMS Medicare.gov Compare Post Acute Care list provided to:: Patient Choice offered to / list presented to : Patient                        Discharge Plan and Services Additional resources added to the After Visit Summary for     Discharge Planning Services: CM Consult Post Acute Care Choice: Skilled Nursing Facility                               Social Determinants of Health (SDOH) Interventions SDOH Screenings   Tobacco Use: High Risk (09/15/2022)     Readmission Risk Interventions     No data to display         Quintella Baton, RN, BSN  Trauma/Neuro ICU Case Manager [phone]

## 2022-09-29 NOTE — Plan of Care (Signed)
Pt A/O, VS stable, 2L [place]. AVS printed, education provided. Papers and All belongings were given to PTAR. IV removed and intact.  Pt transferred off the floor

## 2022-09-30 ENCOUNTER — Encounter: Payer: Self-pay | Admitting: Podiatry

## 2022-09-30 DIAGNOSIS — W3400XA Accidental discharge from unspecified firearms or gun, initial encounter: Secondary | ICD-10-CM | POA: Diagnosis not present

## 2022-09-30 DIAGNOSIS — S2241XD Multiple fractures of ribs, right side, subsequent encounter for fracture with routine healing: Secondary | ICD-10-CM | POA: Diagnosis not present

## 2022-09-30 DIAGNOSIS — E119 Type 2 diabetes mellitus without complications: Secondary | ICD-10-CM | POA: Diagnosis not present

## 2022-09-30 DIAGNOSIS — W3400XD Accidental discharge from unspecified firearms or gun, subsequent encounter: Secondary | ICD-10-CM | POA: Diagnosis not present

## 2022-09-30 DIAGNOSIS — I10 Essential (primary) hypertension: Secondary | ICD-10-CM | POA: Diagnosis not present

## 2022-09-30 DIAGNOSIS — R5381 Other malaise: Secondary | ICD-10-CM | POA: Diagnosis not present

## 2022-10-01 DIAGNOSIS — G8221 Paraplegia, complete: Secondary | ICD-10-CM | POA: Diagnosis not present

## 2022-10-01 DIAGNOSIS — I10 Essential (primary) hypertension: Secondary | ICD-10-CM | POA: Diagnosis not present

## 2022-10-01 DIAGNOSIS — E119 Type 2 diabetes mellitus without complications: Secondary | ICD-10-CM | POA: Diagnosis not present

## 2022-10-02 DIAGNOSIS — G8221 Paraplegia, complete: Secondary | ICD-10-CM | POA: Diagnosis not present

## 2022-10-02 DIAGNOSIS — E119 Type 2 diabetes mellitus without complications: Secondary | ICD-10-CM | POA: Diagnosis not present

## 2022-10-02 DIAGNOSIS — W3400XD Accidental discharge from unspecified firearms or gun, subsequent encounter: Secondary | ICD-10-CM | POA: Diagnosis not present

## 2022-10-02 DIAGNOSIS — S2241XD Multiple fractures of ribs, right side, subsequent encounter for fracture with routine healing: Secondary | ICD-10-CM | POA: Diagnosis not present

## 2022-10-06 DIAGNOSIS — G8929 Other chronic pain: Secondary | ICD-10-CM | POA: Diagnosis not present

## 2022-10-07 DIAGNOSIS — E8809 Other disorders of plasma-protein metabolism, not elsewhere classified: Secondary | ICD-10-CM | POA: Diagnosis not present

## 2022-10-08 DIAGNOSIS — R8290 Unspecified abnormal findings in urine: Secondary | ICD-10-CM | POA: Diagnosis not present

## 2022-10-09 ENCOUNTER — Ambulatory Visit: Payer: 59 | Attending: Internal Medicine | Admitting: Internal Medicine

## 2022-10-09 ENCOUNTER — Telehealth: Payer: Self-pay

## 2022-10-09 DIAGNOSIS — E119 Type 2 diabetes mellitus without complications: Secondary | ICD-10-CM | POA: Diagnosis not present

## 2022-10-09 DIAGNOSIS — S2241XD Multiple fractures of ribs, right side, subsequent encounter for fracture with routine healing: Secondary | ICD-10-CM | POA: Diagnosis not present

## 2022-10-09 DIAGNOSIS — W3400XD Accidental discharge from unspecified firearms or gun, subsequent encounter: Secondary | ICD-10-CM | POA: Diagnosis not present

## 2022-10-09 NOTE — Telephone Encounter (Signed)
Called but no answer. Unable to LVM. Please connect patient to [facility] if patient calls back. Need to review information before today's virtual appointment.  

## 2022-10-13 DIAGNOSIS — G8929 Other chronic pain: Secondary | ICD-10-CM | POA: Diagnosis not present

## 2022-10-14 DIAGNOSIS — R109 Unspecified abdominal pain: Secondary | ICD-10-CM | POA: Diagnosis not present

## 2022-10-14 DIAGNOSIS — G894 Chronic pain syndrome: Secondary | ICD-10-CM | POA: Diagnosis not present

## 2022-10-16 ENCOUNTER — Emergency Department
Admission: EM | Admit: 2022-10-16 | Discharge: 2022-10-16 | Disposition: A | Discharge status: Home / Self Care | Payer: 59 | Attending: Emergency Medicine | Admitting: Emergency Medicine

## 2022-10-16 ENCOUNTER — Emergency Department: Payer: 59

## 2022-10-16 ENCOUNTER — Other Ambulatory Visit: Payer: Self-pay

## 2022-10-16 DIAGNOSIS — I10 Essential (primary) hypertension: Secondary | ICD-10-CM | POA: Insufficient documentation

## 2022-10-16 DIAGNOSIS — Z743 Need for continuous supervision: Secondary | ICD-10-CM | POA: Diagnosis not present

## 2022-10-16 DIAGNOSIS — R6889 Other general symptoms and signs: Secondary | ICD-10-CM | POA: Diagnosis not present

## 2022-10-16 DIAGNOSIS — K8020 Calculus of gallbladder without cholecystitis without obstruction: Secondary | ICD-10-CM | POA: Diagnosis not present

## 2022-10-16 DIAGNOSIS — R1084 Generalized abdominal pain: Secondary | ICD-10-CM | POA: Diagnosis present

## 2022-10-16 DIAGNOSIS — R1033 Periumbilical pain: Secondary | ICD-10-CM | POA: Insufficient documentation

## 2022-10-16 DIAGNOSIS — Z741 Need for assistance with personal care: Secondary | ICD-10-CM | POA: Diagnosis not present

## 2022-10-16 DIAGNOSIS — K6289 Other specified diseases of anus and rectum: Secondary | ICD-10-CM | POA: Diagnosis not present

## 2022-10-16 DIAGNOSIS — R109 Unspecified abdominal pain: Secondary | ICD-10-CM | POA: Diagnosis not present

## 2022-10-16 DIAGNOSIS — W19XXXA Unspecified fall, initial encounter: Secondary | ICD-10-CM | POA: Diagnosis not present

## 2022-10-16 DIAGNOSIS — N390 Urinary tract infection, site not specified: Secondary | ICD-10-CM

## 2022-10-16 DIAGNOSIS — R0902 Hypoxemia: Secondary | ICD-10-CM | POA: Diagnosis not present

## 2022-10-16 LAB — CBC WITH DIFFERENTIAL/PLATELET
Abs Immature Granulocytes: 0.04 10*3/uL (ref 0.00–0.07)
Basophils Absolute: 0 10*3/uL (ref 0.0–0.1)
Basophils Relative: 1 %
Eosinophils Absolute: 0.3 10*3/uL (ref 0.0–0.5)
Eosinophils Relative: 5 %
HCT: 32.9 % — ABNORMAL LOW (ref 36.0–46.0)
Hemoglobin: 10.2 g/dL — ABNORMAL LOW (ref 12.0–15.0)
Immature Granulocytes: 1 %
Lymphocytes Relative: 20 %
Lymphs Abs: 1.5 10*3/uL (ref 0.7–4.0)
MCH: 22.1 pg — ABNORMAL LOW (ref 26.0–34.0)
MCHC: 31 g/dL (ref 30.0–36.0)
MCV: 71.4 fL — ABNORMAL LOW (ref 80.0–100.0)
Monocytes Absolute: 0.5 10*3/uL (ref 0.1–1.0)
Monocytes Relative: 6 %
Neutro Abs: 5.1 10*3/uL (ref 1.7–7.7)
Neutrophils Relative %: 67 %
Platelets: 476 10*3/uL — ABNORMAL HIGH (ref 150–400)
RBC: 4.61 MIL/uL (ref 3.87–5.11)
RDW: 15.5 % (ref 11.5–15.5)
WBC: 7.5 10*3/uL (ref 4.0–10.5)
nRBC: 0 % (ref 0.0–0.2)

## 2022-10-16 LAB — URINALYSIS, ROUTINE W REFLEX MICROSCOPIC
Bilirubin Urine: NEGATIVE
Glucose, UA: NEGATIVE mg/dL
Ketones, ur: NEGATIVE mg/dL
Nitrite: POSITIVE — AB
Protein, ur: NEGATIVE mg/dL
Specific Gravity, Urine: 1.017 (ref 1.005–1.030)
WBC, UA: >50 WBC/hpf (ref 0–5)
pH: 5 (ref 5.0–8.0)

## 2022-10-16 LAB — COMPREHENSIVE METABOLIC PANEL
ALT: 14 U/L (ref 0–44)
AST: 17 U/L (ref 15–41)
Albumin: 2.3 g/dL — ABNORMAL LOW (ref 3.5–5.0)
Alkaline Phosphatase: 57 U/L (ref 38–126)
Anion gap: 7 (ref 5–15)
BUN: 13 mg/dL (ref 8–23)
CO2: 27 mmol/L (ref 22–32)
Calcium: 9.2 mg/dL (ref 8.9–10.3)
Chloride: 103 mmol/L (ref 98–111)
Creatinine, Ser: 0.58 mg/dL (ref 0.44–1.00)
GFR, Estimated: >60 mL/min (ref >60)
Glucose, Bld: 94 mg/dL (ref 70–99)
Potassium: 5 mmol/L (ref 3.5–5.1)
Sodium: 137 mmol/L (ref 135–145)
Total Bilirubin: 0.7 mg/dL (ref 0.3–1.2)
Total Protein: 7.8 g/dL (ref 6.5–8.1)

## 2022-10-16 LAB — LIPASE, BLOOD: Lipase: 25 U/L (ref 11–51)

## 2022-10-16 MED ORDER — HYDROCODONE-ACETAMINOPHEN 5-325 MG PO TABS
2.0000 | ORAL_TABLET | Freq: Once | ORAL | Status: AC
  Administered 2022-10-16: 2 via ORAL
  Filled 2022-10-16: qty 2

## 2022-10-16 MED ORDER — IOHEXOL 300 MG/ML  SOLN
100.0000 mL | Freq: Once | INTRAMUSCULAR | Status: AC | PRN
  Administered 2022-10-16: 100 mL via INTRAVENOUS

## 2022-10-16 MED ORDER — CEFDINIR 300 MG PO CAPS: 300.0000 mg | ORAL_CAPSULE | Freq: Two times a day (BID) | ORAL | 0 refills | Status: AC

## 2022-10-16 MED ORDER — SODIUM CHLORIDE 0.9 % IV SOLN
1.0000 g | Freq: Once | INTRAVENOUS | Status: AC
  Administered 2022-10-16: 1 g via INTRAVENOUS
  Filled 2022-10-16: qty 10
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
## 2022-10-16 NOTE — ED Triage Notes (Signed)
Patient presents from [facility] with c/o generalized abdominal pain with nausea that began approx. 3 days ago; Unsure of when her last bowel movement was so she was given an enema this morning and did pass stool but the pain has not subsided

## 2022-10-16 NOTE — ED Notes (Signed)
Attempted to call [facility] x1. No answer.

## 2022-10-16 NOTE — Discharge Instructions (Addendum)
Please use MiraLAX one half capful every hour until your first bowel movement.  Please do not take any MiraLAX after this for at least 24 hours.  You may use one half capful twice a day of MiraLAX in order to have 1 solid well-formed bowel movement per day.  You may increase or decrease this dosage as needed to obtain this 1 well-formed bowel movement.  Please make sure that you are drinking at least 8 ounces of water every hour during this initial bowel regimen. ?

## 2022-10-19 DIAGNOSIS — R109 Unspecified abdominal pain: Secondary | ICD-10-CM | POA: Diagnosis not present

## 2022-10-20 DIAGNOSIS — N390 Urinary tract infection, site not specified: Secondary | ICD-10-CM | POA: Diagnosis not present

## 2022-10-22 DIAGNOSIS — D649 Anemia, unspecified: Secondary | ICD-10-CM | POA: Diagnosis not present

## 2022-10-22 DIAGNOSIS — R0602 Shortness of breath: Secondary | ICD-10-CM | POA: Diagnosis not present

## 2022-10-26 DIAGNOSIS — R238 Other skin changes: Secondary | ICD-10-CM | POA: Diagnosis not present

## 2022-10-27 DIAGNOSIS — G8929 Other chronic pain: Secondary | ICD-10-CM | POA: Diagnosis not present

## 2022-10-28 DIAGNOSIS — G8929 Other chronic pain: Secondary | ICD-10-CM | POA: Diagnosis not present

## 2022-10-29 DIAGNOSIS — G8929 Other chronic pain: Secondary | ICD-10-CM | POA: Diagnosis not present

## 2022-10-30 DIAGNOSIS — R52 Pain, unspecified: Secondary | ICD-10-CM | POA: Diagnosis not present

## 2022-11-02 DIAGNOSIS — D649 Anemia, unspecified: Secondary | ICD-10-CM | POA: Diagnosis not present

## 2022-11-03 DIAGNOSIS — K9289 Other specified diseases of the digestive system: Secondary | ICD-10-CM | POA: Diagnosis not present

## 2022-11-04 DIAGNOSIS — K219 Gastro-esophageal reflux disease without esophagitis: Secondary | ICD-10-CM | POA: Diagnosis not present

## 2022-11-07 DIAGNOSIS — W3400XD Accidental discharge from unspecified firearms or gun, subsequent encounter: Secondary | ICD-10-CM | POA: Diagnosis not present

## 2022-11-07 DIAGNOSIS — G8250 Quadriplegia, unspecified: Secondary | ICD-10-CM | POA: Diagnosis not present

## 2022-11-07 DIAGNOSIS — E119 Type 2 diabetes mellitus without complications: Secondary | ICD-10-CM | POA: Diagnosis not present

## 2022-11-07 DIAGNOSIS — S2241XD Multiple fractures of ribs, right side, subsequent encounter for fracture with routine healing: Secondary | ICD-10-CM | POA: Diagnosis not present

## 2022-11-09 DIAGNOSIS — R609 Edema, unspecified: Secondary | ICD-10-CM | POA: Diagnosis not present

## 2022-11-10 DIAGNOSIS — E8809 Other disorders of plasma-protein metabolism, not elsewhere classified: Secondary | ICD-10-CM | POA: Diagnosis not present

## 2022-11-11 DIAGNOSIS — R600 Localized edema: Secondary | ICD-10-CM | POA: Diagnosis not present

## 2022-11-13 DIAGNOSIS — E8809 Other disorders of plasma-protein metabolism, not elsewhere classified: Secondary | ICD-10-CM | POA: Diagnosis not present

## 2022-11-15 DIAGNOSIS — G8221 Paraplegia, complete: Secondary | ICD-10-CM | POA: Diagnosis not present

## 2022-11-15 DIAGNOSIS — M6281 Muscle weakness (generalized): Secondary | ICD-10-CM | POA: Diagnosis not present

## 2022-11-15 DIAGNOSIS — R1310 Dysphagia, unspecified: Secondary | ICD-10-CM | POA: Diagnosis not present

## 2022-11-16 DIAGNOSIS — K5900 Constipation, unspecified: Secondary | ICD-10-CM | POA: Diagnosis not present

## 2022-11-16 DIAGNOSIS — I10 Essential (primary) hypertension: Secondary | ICD-10-CM | POA: Diagnosis not present

## 2022-11-16 DIAGNOSIS — M6281 Muscle weakness (generalized): Secondary | ICD-10-CM | POA: Diagnosis not present

## 2022-11-16 DIAGNOSIS — R1310 Dysphagia, unspecified: Secondary | ICD-10-CM | POA: Diagnosis not present

## 2022-11-16 DIAGNOSIS — G8929 Other chronic pain: Secondary | ICD-10-CM | POA: Diagnosis not present

## 2022-11-16 DIAGNOSIS — N390 Urinary tract infection, site not specified: Secondary | ICD-10-CM | POA: Diagnosis not present

## 2022-11-16 DIAGNOSIS — E119 Type 2 diabetes mellitus without complications: Secondary | ICD-10-CM | POA: Diagnosis not present

## 2022-11-16 DIAGNOSIS — G8221 Paraplegia, complete: Secondary | ICD-10-CM | POA: Diagnosis not present

## 2022-11-17 DIAGNOSIS — E8809 Other disorders of plasma-protein metabolism, not elsewhere classified: Secondary | ICD-10-CM | POA: Diagnosis not present

## 2022-11-17 DIAGNOSIS — K5900 Constipation, unspecified: Secondary | ICD-10-CM | POA: Diagnosis not present

## 2022-11-17 DIAGNOSIS — G8221 Paraplegia, complete: Secondary | ICD-10-CM | POA: Diagnosis not present

## 2022-11-17 DIAGNOSIS — M6281 Muscle weakness (generalized): Secondary | ICD-10-CM | POA: Diagnosis not present

## 2022-11-17 DIAGNOSIS — M8580 Other specified disorders of bone density and structure, unspecified site: Secondary | ICD-10-CM | POA: Diagnosis not present

## 2022-11-17 DIAGNOSIS — R109 Unspecified abdominal pain: Secondary | ICD-10-CM | POA: Diagnosis not present

## 2022-11-17 DIAGNOSIS — M479 Spondylosis, unspecified: Secondary | ICD-10-CM | POA: Diagnosis not present

## 2022-11-17 DIAGNOSIS — R1310 Dysphagia, unspecified: Secondary | ICD-10-CM | POA: Diagnosis not present

## 2022-11-17 DIAGNOSIS — N390 Urinary tract infection, site not specified: Secondary | ICD-10-CM | POA: Diagnosis not present

## 2022-11-17 DIAGNOSIS — R600 Localized edema: Secondary | ICD-10-CM | POA: Diagnosis not present

## 2022-11-18 DIAGNOSIS — N390 Urinary tract infection, site not specified: Secondary | ICD-10-CM | POA: Diagnosis not present

## 2022-11-19 DIAGNOSIS — L89893 Pressure ulcer of other site, stage 3: Secondary | ICD-10-CM | POA: Diagnosis not present

## 2022-11-19 DIAGNOSIS — G8221 Paraplegia, complete: Secondary | ICD-10-CM | POA: Diagnosis not present

## 2022-11-19 DIAGNOSIS — S81809A Unspecified open wound, unspecified lower leg, initial encounter: Secondary | ICD-10-CM | POA: Diagnosis not present

## 2022-11-19 DIAGNOSIS — E119 Type 2 diabetes mellitus without complications: Secondary | ICD-10-CM | POA: Diagnosis not present

## 2022-11-20 DIAGNOSIS — N390 Urinary tract infection, site not specified: Secondary | ICD-10-CM | POA: Diagnosis not present

## 2022-11-20 DIAGNOSIS — M79659 Pain in unspecified thigh: Secondary | ICD-10-CM | POA: Diagnosis not present

## 2022-11-20 DIAGNOSIS — B965 Pseudomonas (aeruginosa) (mallei) (pseudomallei) as the cause of diseases classified elsewhere: Secondary | ICD-10-CM | POA: Diagnosis not present

## 2022-11-23 ENCOUNTER — Ambulatory Visit: Payer: 59 | Admitting: Podiatry

## 2022-11-23 DIAGNOSIS — L89609 Pressure ulcer of unspecified heel, unspecified stage: Secondary | ICD-10-CM

## 2022-11-23 DIAGNOSIS — M79675 Pain in left toe(s): Secondary | ICD-10-CM

## 2022-11-23 DIAGNOSIS — B351 Tinea unguium: Secondary | ICD-10-CM | POA: Diagnosis not present

## 2022-11-23 DIAGNOSIS — M79674 Pain in right toe(s): Secondary | ICD-10-CM

## 2022-11-23 DIAGNOSIS — N289 Disorder of kidney and ureter, unspecified: Secondary | ICD-10-CM | POA: Diagnosis not present

## 2022-11-23 DIAGNOSIS — M792 Neuralgia and neuritis, unspecified: Secondary | ICD-10-CM | POA: Diagnosis not present

## 2022-11-23 NOTE — Progress Notes (Signed)
This patient returns to my office for at risk foot care.  This patient requires this care by a professional since this patient will be at risk due to having renal insufficiency. This patient is unable to cut nails herself since the patient cannot reach her nails.These nails are painful walking and wearing shoes.  This patient presents for at risk foot care today.  General Appearance  Alert, conversant and in no acute stress.  Vascular  Dorsalis pedal pulses are palpable  bilaterally. Posterior tibial pulses are weakly palpable bilateral. Capillary return is within normal limits  bilaterally. Temperature is within normal limits  bilaterally.  Neurologic  Senn-Weinstein monofilament wire test diminished  bilaterally. Muscle power within normal limits bilaterally.  Nails Thick disfigured discolored nails with subungual debris  from hallux to fifth toes bilaterally. No evidence of bacterial infection or drainage bilaterally.  Orthopedic  No limitations of motion  feet .  No crepitus or effusions noted.  No bony pathology or digital deformities noted.HAV  B/L.  Skin  normotropic skin with no porokeratosis noted bilaterally.  No signs of infections or ulcers noted.   Heel sore left foot.   Onychomycosis  Pain in right toes  Pain in left toes  Pressure Sore left foot.  Consent was obtained for treatment procedures.   Mechanical debridement of nails 1-5  bilaterally performed with a nail nipper.  Filed with dremel without incident. ROV.  Padding dispensed.   Return office visit    3  months                  Told patient to return for periodic foot care and evaluation due to potential at risk complications.   Helane Gunther DPM

## 2022-11-24 DIAGNOSIS — E119 Type 2 diabetes mellitus without complications: Secondary | ICD-10-CM | POA: Diagnosis not present

## 2022-11-24 DIAGNOSIS — L89893 Pressure ulcer of other site, stage 3: Secondary | ICD-10-CM | POA: Diagnosis not present

## 2022-11-24 DIAGNOSIS — R609 Edema, unspecified: Secondary | ICD-10-CM | POA: Diagnosis not present

## 2022-11-24 DIAGNOSIS — G8221 Paraplegia, complete: Secondary | ICD-10-CM | POA: Diagnosis not present

## 2022-11-25 DIAGNOSIS — R609 Edema, unspecified: Secondary | ICD-10-CM | POA: Diagnosis not present

## 2022-11-26 DIAGNOSIS — D649 Anemia, unspecified: Secondary | ICD-10-CM | POA: Diagnosis not present

## 2022-11-27 DIAGNOSIS — E8809 Other disorders of plasma-protein metabolism, not elsewhere classified: Secondary | ICD-10-CM | POA: Diagnosis not present

## 2022-11-30 DIAGNOSIS — I70722 Atherosclerosis of other type of bypass graft(s) of the extremities with rest pain, left leg: Secondary | ICD-10-CM | POA: Diagnosis not present

## 2022-12-02 DIAGNOSIS — E119 Type 2 diabetes mellitus without complications: Secondary | ICD-10-CM | POA: Diagnosis not present

## 2022-12-02 DIAGNOSIS — E8809 Other disorders of plasma-protein metabolism, not elsewhere classified: Secondary | ICD-10-CM | POA: Diagnosis not present

## 2022-12-02 DIAGNOSIS — I10 Essential (primary) hypertension: Secondary | ICD-10-CM | POA: Diagnosis not present

## 2022-12-03 DIAGNOSIS — R52 Pain, unspecified: Secondary | ICD-10-CM | POA: Diagnosis not present

## 2022-12-03 DIAGNOSIS — L89893 Pressure ulcer of other site, stage 3: Secondary | ICD-10-CM | POA: Diagnosis not present

## 2022-12-09 DIAGNOSIS — M79604 Pain in right leg: Secondary | ICD-10-CM | POA: Diagnosis not present

## 2022-12-09 DIAGNOSIS — M79605 Pain in left leg: Secondary | ICD-10-CM | POA: Diagnosis not present

## 2022-12-09 DIAGNOSIS — R609 Edema, unspecified: Secondary | ICD-10-CM | POA: Diagnosis not present

## 2022-12-09 DIAGNOSIS — M792 Neuralgia and neuritis, unspecified: Secondary | ICD-10-CM | POA: Diagnosis not present

## 2022-12-09 DIAGNOSIS — D649 Anemia, unspecified: Secondary | ICD-10-CM | POA: Diagnosis not present

## 2022-12-10 DIAGNOSIS — L89893 Pressure ulcer of other site, stage 3: Secondary | ICD-10-CM | POA: Diagnosis not present

## 2022-12-10 DIAGNOSIS — E119 Type 2 diabetes mellitus without complications: Secondary | ICD-10-CM | POA: Diagnosis not present

## 2022-12-10 DIAGNOSIS — G8221 Paraplegia, complete: Secondary | ICD-10-CM | POA: Diagnosis not present

## 2022-12-16 DIAGNOSIS — G894 Chronic pain syndrome: Secondary | ICD-10-CM | POA: Diagnosis not present

## 2022-12-16 DIAGNOSIS — G4701 Insomnia due to medical condition: Secondary | ICD-10-CM | POA: Diagnosis not present

## 2022-12-17 DIAGNOSIS — L89893 Pressure ulcer of other site, stage 3: Secondary | ICD-10-CM | POA: Diagnosis not present

## 2022-12-17 DIAGNOSIS — E119 Type 2 diabetes mellitus without complications: Secondary | ICD-10-CM | POA: Diagnosis not present

## 2022-12-17 DIAGNOSIS — G8221 Paraplegia, complete: Secondary | ICD-10-CM | POA: Diagnosis not present

## 2022-12-18 DIAGNOSIS — M79605 Pain in left leg: Secondary | ICD-10-CM | POA: Diagnosis not present

## 2022-12-18 DIAGNOSIS — M792 Neuralgia and neuritis, unspecified: Secondary | ICD-10-CM | POA: Diagnosis not present

## 2022-12-18 DIAGNOSIS — M79604 Pain in right leg: Secondary | ICD-10-CM | POA: Diagnosis not present

## 2022-12-21 DIAGNOSIS — M792 Neuralgia and neuritis, unspecified: Secondary | ICD-10-CM | POA: Diagnosis not present

## 2022-12-22 DIAGNOSIS — W3400XD Accidental discharge from unspecified firearms or gun, subsequent encounter: Secondary | ICD-10-CM | POA: Diagnosis not present

## 2022-12-22 DIAGNOSIS — E119 Type 2 diabetes mellitus without complications: Secondary | ICD-10-CM | POA: Diagnosis not present

## 2022-12-22 DIAGNOSIS — S2241XD Multiple fractures of ribs, right side, subsequent encounter for fracture with routine healing: Secondary | ICD-10-CM | POA: Diagnosis not present

## 2022-12-22 DIAGNOSIS — G8250 Quadriplegia, unspecified: Secondary | ICD-10-CM | POA: Diagnosis not present

## 2023-01-05 DIAGNOSIS — Z0189 Encounter for other specified special examinations: Secondary | ICD-10-CM | POA: Diagnosis not present

## 2023-01-05 DIAGNOSIS — S2241XD Multiple fractures of ribs, right side, subsequent encounter for fracture with routine healing: Secondary | ICD-10-CM | POA: Diagnosis not present

## 2023-01-05 DIAGNOSIS — W3400XD Accidental discharge from unspecified firearms or gun, subsequent encounter: Secondary | ICD-10-CM | POA: Diagnosis not present

## 2023-01-05 DIAGNOSIS — G8250 Quadriplegia, unspecified: Secondary | ICD-10-CM | POA: Diagnosis not present

## 2023-01-05 DIAGNOSIS — E119 Type 2 diabetes mellitus without complications: Secondary | ICD-10-CM | POA: Diagnosis not present

## 2023-01-07 DIAGNOSIS — M6281 Muscle weakness (generalized): Secondary | ICD-10-CM | POA: Diagnosis not present

## 2023-01-07 DIAGNOSIS — G8221 Paraplegia, complete: Secondary | ICD-10-CM | POA: Diagnosis not present

## 2023-01-07 DIAGNOSIS — N319 Neuromuscular dysfunction of bladder, unspecified: Secondary | ICD-10-CM | POA: Diagnosis not present

## 2023-01-08 DIAGNOSIS — M6281 Muscle weakness (generalized): Secondary | ICD-10-CM | POA: Diagnosis not present

## 2023-01-08 DIAGNOSIS — G8221 Paraplegia, complete: Secondary | ICD-10-CM | POA: Diagnosis not present

## 2023-01-08 DIAGNOSIS — N319 Neuromuscular dysfunction of bladder, unspecified: Secondary | ICD-10-CM | POA: Diagnosis not present

## 2023-01-11 DIAGNOSIS — G8929 Other chronic pain: Secondary | ICD-10-CM | POA: Diagnosis not present

## 2023-01-11 DIAGNOSIS — Z760 Encounter for issue of repeat prescription: Secondary | ICD-10-CM | POA: Diagnosis not present

## 2023-01-11 DIAGNOSIS — Z79899 Other long term (current) drug therapy: Secondary | ICD-10-CM | POA: Diagnosis not present

## 2023-01-11 DIAGNOSIS — M792 Neuralgia and neuritis, unspecified: Secondary | ICD-10-CM | POA: Diagnosis not present

## 2023-01-22 DIAGNOSIS — Z0189 Encounter for other specified special examinations: Secondary | ICD-10-CM | POA: Diagnosis not present

## 2023-01-22 DIAGNOSIS — W3400XD Accidental discharge from unspecified firearms or gun, subsequent encounter: Secondary | ICD-10-CM | POA: Diagnosis not present

## 2023-01-22 DIAGNOSIS — G8250 Quadriplegia, unspecified: Secondary | ICD-10-CM | POA: Diagnosis not present

## 2023-01-22 DIAGNOSIS — S2241XD Multiple fractures of ribs, right side, subsequent encounter for fracture with routine healing: Secondary | ICD-10-CM | POA: Diagnosis not present

## 2023-01-22 DIAGNOSIS — E119 Type 2 diabetes mellitus without complications: Secondary | ICD-10-CM | POA: Diagnosis not present

## 2023-01-27 DIAGNOSIS — G839 Paralytic syndrome, unspecified: Secondary | ICD-10-CM | POA: Diagnosis not present

## 2023-01-27 DIAGNOSIS — R200 Anesthesia of skin: Secondary | ICD-10-CM | POA: Diagnosis not present

## 2023-01-27 DIAGNOSIS — M79605 Pain in left leg: Secondary | ICD-10-CM | POA: Diagnosis not present

## 2023-01-27 DIAGNOSIS — R202 Paresthesia of skin: Secondary | ICD-10-CM | POA: Diagnosis not present

## 2023-01-27 DIAGNOSIS — R208 Other disturbances of skin sensation: Secondary | ICD-10-CM | POA: Diagnosis not present

## 2023-01-27 DIAGNOSIS — W3400XA Accidental discharge from unspecified firearms or gun, initial encounter: Secondary | ICD-10-CM | POA: Diagnosis not present

## 2023-01-27 DIAGNOSIS — M79604 Pain in right leg: Secondary | ICD-10-CM | POA: Diagnosis not present

## 2023-01-28 DIAGNOSIS — D649 Anemia, unspecified: Secondary | ICD-10-CM | POA: Diagnosis not present

## 2023-01-28 DIAGNOSIS — R1310 Dysphagia, unspecified: Secondary | ICD-10-CM | POA: Diagnosis not present

## 2023-01-28 DIAGNOSIS — G8221 Paraplegia, complete: Secondary | ICD-10-CM | POA: Diagnosis not present

## 2023-01-28 DIAGNOSIS — M6281 Muscle weakness (generalized): Secondary | ICD-10-CM | POA: Diagnosis not present

## 2023-01-29 DIAGNOSIS — R1310 Dysphagia, unspecified: Secondary | ICD-10-CM | POA: Diagnosis not present

## 2023-01-29 DIAGNOSIS — G8221 Paraplegia, complete: Secondary | ICD-10-CM | POA: Diagnosis not present

## 2023-01-29 DIAGNOSIS — M6281 Muscle weakness (generalized): Secondary | ICD-10-CM | POA: Diagnosis not present

## 2023-02-01 DIAGNOSIS — R1310 Dysphagia, unspecified: Secondary | ICD-10-CM | POA: Diagnosis not present

## 2023-02-01 DIAGNOSIS — Z760 Encounter for issue of repeat prescription: Secondary | ICD-10-CM | POA: Diagnosis not present

## 2023-02-01 DIAGNOSIS — G8250 Quadriplegia, unspecified: Secondary | ICD-10-CM | POA: Diagnosis not present

## 2023-02-01 DIAGNOSIS — G8221 Paraplegia, complete: Secondary | ICD-10-CM | POA: Diagnosis not present

## 2023-02-01 DIAGNOSIS — W3400XD Accidental discharge from unspecified firearms or gun, subsequent encounter: Secondary | ICD-10-CM | POA: Diagnosis not present

## 2023-02-01 DIAGNOSIS — I10 Essential (primary) hypertension: Secondary | ICD-10-CM | POA: Diagnosis not present

## 2023-02-01 DIAGNOSIS — S2241XD Multiple fractures of ribs, right side, subsequent encounter for fracture with routine healing: Secondary | ICD-10-CM | POA: Diagnosis not present

## 2023-02-01 DIAGNOSIS — M6281 Muscle weakness (generalized): Secondary | ICD-10-CM | POA: Diagnosis not present

## 2023-02-01 DIAGNOSIS — E119 Type 2 diabetes mellitus without complications: Secondary | ICD-10-CM | POA: Diagnosis not present

## 2023-02-02 DIAGNOSIS — G8221 Paraplegia, complete: Secondary | ICD-10-CM | POA: Diagnosis not present

## 2023-02-02 DIAGNOSIS — Z760 Encounter for issue of repeat prescription: Secondary | ICD-10-CM | POA: Diagnosis not present

## 2023-02-02 DIAGNOSIS — S2241XD Multiple fractures of ribs, right side, subsequent encounter for fracture with routine healing: Secondary | ICD-10-CM | POA: Diagnosis not present

## 2023-02-02 DIAGNOSIS — G8250 Quadriplegia, unspecified: Secondary | ICD-10-CM | POA: Diagnosis not present

## 2023-02-02 DIAGNOSIS — W3400XD Accidental discharge from unspecified firearms or gun, subsequent encounter: Secondary | ICD-10-CM | POA: Diagnosis not present

## 2023-02-02 DIAGNOSIS — R1310 Dysphagia, unspecified: Secondary | ICD-10-CM | POA: Diagnosis not present

## 2023-02-02 DIAGNOSIS — M6281 Muscle weakness (generalized): Secondary | ICD-10-CM | POA: Diagnosis not present

## 2023-02-02 DIAGNOSIS — E119 Type 2 diabetes mellitus without complications: Secondary | ICD-10-CM | POA: Diagnosis not present

## 2023-02-03 DIAGNOSIS — G8221 Paraplegia, complete: Secondary | ICD-10-CM | POA: Diagnosis not present

## 2023-02-03 DIAGNOSIS — E119 Type 2 diabetes mellitus without complications: Secondary | ICD-10-CM | POA: Diagnosis not present

## 2023-02-03 DIAGNOSIS — I10 Essential (primary) hypertension: Secondary | ICD-10-CM | POA: Diagnosis not present

## 2023-02-03 DIAGNOSIS — R1310 Dysphagia, unspecified: Secondary | ICD-10-CM | POA: Diagnosis not present

## 2023-02-03 DIAGNOSIS — M6281 Muscle weakness (generalized): Secondary | ICD-10-CM | POA: Diagnosis not present

## 2023-02-04 DIAGNOSIS — G8221 Paraplegia, complete: Secondary | ICD-10-CM | POA: Diagnosis not present

## 2023-02-04 DIAGNOSIS — R1310 Dysphagia, unspecified: Secondary | ICD-10-CM | POA: Diagnosis not present

## 2023-02-04 DIAGNOSIS — M6281 Muscle weakness (generalized): Secondary | ICD-10-CM | POA: Diagnosis not present

## 2023-02-05 DIAGNOSIS — G8221 Paraplegia, complete: Secondary | ICD-10-CM | POA: Diagnosis not present

## 2023-02-05 DIAGNOSIS — M6281 Muscle weakness (generalized): Secondary | ICD-10-CM | POA: Diagnosis not present

## 2023-02-05 DIAGNOSIS — R1310 Dysphagia, unspecified: Secondary | ICD-10-CM | POA: Diagnosis not present

## 2023-02-08 DIAGNOSIS — M6281 Muscle weakness (generalized): Secondary | ICD-10-CM | POA: Diagnosis not present

## 2023-02-08 DIAGNOSIS — W3400XD Accidental discharge from unspecified firearms or gun, subsequent encounter: Secondary | ICD-10-CM | POA: Diagnosis not present

## 2023-02-08 DIAGNOSIS — S2241XD Multiple fractures of ribs, right side, subsequent encounter for fracture with routine healing: Secondary | ICD-10-CM | POA: Diagnosis not present

## 2023-02-08 DIAGNOSIS — G8250 Quadriplegia, unspecified: Secondary | ICD-10-CM | POA: Diagnosis not present

## 2023-02-08 DIAGNOSIS — R1310 Dysphagia, unspecified: Secondary | ICD-10-CM | POA: Diagnosis not present

## 2023-02-08 DIAGNOSIS — I10 Essential (primary) hypertension: Secondary | ICD-10-CM | POA: Diagnosis not present

## 2023-02-08 DIAGNOSIS — E119 Type 2 diabetes mellitus without complications: Secondary | ICD-10-CM | POA: Diagnosis not present

## 2023-02-08 DIAGNOSIS — G8221 Paraplegia, complete: Secondary | ICD-10-CM | POA: Diagnosis not present

## 2023-02-09 DIAGNOSIS — M6281 Muscle weakness (generalized): Secondary | ICD-10-CM | POA: Diagnosis not present

## 2023-02-09 DIAGNOSIS — R1310 Dysphagia, unspecified: Secondary | ICD-10-CM | POA: Diagnosis not present

## 2023-02-09 DIAGNOSIS — G8221 Paraplegia, complete: Secondary | ICD-10-CM | POA: Diagnosis not present

## 2023-02-10 DIAGNOSIS — W3400XD Accidental discharge from unspecified firearms or gun, subsequent encounter: Secondary | ICD-10-CM | POA: Diagnosis not present

## 2023-02-10 DIAGNOSIS — S2241XD Multiple fractures of ribs, right side, subsequent encounter for fracture with routine healing: Secondary | ICD-10-CM | POA: Diagnosis not present

## 2023-02-10 DIAGNOSIS — E119 Type 2 diabetes mellitus without complications: Secondary | ICD-10-CM | POA: Diagnosis not present

## 2023-02-10 DIAGNOSIS — G8250 Quadriplegia, unspecified: Secondary | ICD-10-CM | POA: Diagnosis not present

## 2023-02-12 DIAGNOSIS — G8221 Paraplegia, complete: Secondary | ICD-10-CM | POA: Diagnosis not present

## 2023-02-12 DIAGNOSIS — M6281 Muscle weakness (generalized): Secondary | ICD-10-CM | POA: Diagnosis not present

## 2023-02-12 DIAGNOSIS — N319 Neuromuscular dysfunction of bladder, unspecified: Secondary | ICD-10-CM | POA: Diagnosis not present

## 2023-02-19 ENCOUNTER — Telehealth: Payer: Self-pay | Admitting: Internal Medicine

## 2023-02-19 NOTE — Telephone Encounter (Signed)
Shannon [name] is concerned because the patient has not been seen by PCP since 03/2021, please contact karen today if PCP will not follow orders so she can know. She requested urgent feedback today

## 2023-02-19 NOTE — Telephone Encounter (Addendum)
Call placed to patient. Unable to leave message. Phone not working. Patient has been having regular ov visits with [facility]. Patient has not been seen here since 03/14/2021

## 2023-02-19 NOTE — Telephone Encounter (Signed)
Hilbert Corrigan from [place] wants to know if PCP will follow for Nursing, PT, and OT please advise   Best contact: [phone]

## 2023-02-24 ENCOUNTER — Ambulatory Visit: Payer: Medicaid Other | Admitting: Podiatry

## 2023-02-24 DIAGNOSIS — G479 Sleep disorder, unspecified: Secondary | ICD-10-CM | POA: Diagnosis not present

## 2023-02-24 DIAGNOSIS — W3400XA Accidental discharge from unspecified firearms or gun, initial encounter: Secondary | ICD-10-CM | POA: Diagnosis not present

## 2023-02-24 DIAGNOSIS — G629 Polyneuropathy, unspecified: Secondary | ICD-10-CM | POA: Diagnosis not present

## 2023-02-24 DIAGNOSIS — L89513 Pressure ulcer of right ankle, stage 3: Secondary | ICD-10-CM | POA: Diagnosis not present

## 2023-02-24 DIAGNOSIS — Z960 Presence of urogenital implants: Secondary | ICD-10-CM | POA: Diagnosis not present

## 2023-02-24 DIAGNOSIS — R600 Localized edema: Secondary | ICD-10-CM | POA: Diagnosis not present

## 2023-02-26 DIAGNOSIS — M545 Low back pain, unspecified: Secondary | ICD-10-CM | POA: Diagnosis not present

## 2023-02-26 DIAGNOSIS — G8250 Quadriplegia, unspecified: Secondary | ICD-10-CM | POA: Diagnosis not present

## 2023-02-26 DIAGNOSIS — G8929 Other chronic pain: Secondary | ICD-10-CM | POA: Diagnosis not present

## 2023-02-26 DIAGNOSIS — Z7984 Long term (current) use of oral hypoglycemic drugs: Secondary | ICD-10-CM | POA: Diagnosis not present

## 2023-02-26 DIAGNOSIS — E119 Type 2 diabetes mellitus without complications: Secondary | ICD-10-CM | POA: Diagnosis not present

## 2023-02-26 DIAGNOSIS — W3400XS Accidental discharge from unspecified firearms or gun, sequela: Secondary | ICD-10-CM | POA: Diagnosis not present

## 2023-02-26 DIAGNOSIS — Z466 Encounter for fitting and adjustment of urinary device: Secondary | ICD-10-CM | POA: Diagnosis not present

## 2023-02-26 DIAGNOSIS — M792 Neuralgia and neuritis, unspecified: Secondary | ICD-10-CM | POA: Diagnosis not present

## 2023-02-27 DIAGNOSIS — R932 Abnormal findings on diagnostic imaging of liver and biliary tract: Secondary | ICD-10-CM | POA: Diagnosis not present

## 2023-02-27 DIAGNOSIS — R079 Chest pain, unspecified: Secondary | ICD-10-CM | POA: Diagnosis not present

## 2023-02-27 DIAGNOSIS — G8929 Other chronic pain: Secondary | ICD-10-CM | POA: Diagnosis not present

## 2023-02-27 DIAGNOSIS — Z7401 Bed confinement status: Secondary | ICD-10-CM | POA: Diagnosis not present

## 2023-02-27 DIAGNOSIS — R600 Localized edema: Secondary | ICD-10-CM | POA: Diagnosis not present

## 2023-02-27 DIAGNOSIS — R9431 Abnormal electrocardiogram [ECG] [EKG]: Secondary | ICD-10-CM | POA: Diagnosis not present

## 2023-02-27 DIAGNOSIS — Z743 Need for continuous supervision: Secondary | ICD-10-CM | POA: Diagnosis not present

## 2023-02-27 DIAGNOSIS — R109 Unspecified abdominal pain: Secondary | ICD-10-CM | POA: Diagnosis not present

## 2023-02-27 DIAGNOSIS — S31000A Unspecified open wound of lower back and pelvis without penetration into retroperitoneum, initial encounter: Secondary | ICD-10-CM | POA: Diagnosis not present

## 2023-02-27 DIAGNOSIS — G8220 Paraplegia, unspecified: Secondary | ICD-10-CM | POA: Diagnosis not present

## 2023-02-27 DIAGNOSIS — R1084 Generalized abdominal pain: Secondary | ICD-10-CM | POA: Diagnosis not present

## 2023-02-27 DIAGNOSIS — K8000 Calculus of gallbladder with acute cholecystitis without obstruction: Secondary | ICD-10-CM | POA: Diagnosis not present

## 2023-02-27 DIAGNOSIS — D649 Anemia, unspecified: Secondary | ICD-10-CM | POA: Diagnosis not present

## 2023-02-27 DIAGNOSIS — I517 Cardiomegaly: Secondary | ICD-10-CM | POA: Diagnosis not present

## 2023-02-27 DIAGNOSIS — Z960 Presence of urogenital implants: Secondary | ICD-10-CM | POA: Diagnosis not present

## 2023-02-27 DIAGNOSIS — R1013 Epigastric pain: Secondary | ICD-10-CM | POA: Diagnosis not present

## 2023-02-27 DIAGNOSIS — K810 Acute cholecystitis: Secondary | ICD-10-CM | POA: Diagnosis not present

## 2023-02-27 DIAGNOSIS — K5900 Constipation, unspecified: Secondary | ICD-10-CM | POA: Diagnosis not present

## 2023-02-27 DIAGNOSIS — N390 Urinary tract infection, site not specified: Secondary | ICD-10-CM | POA: Diagnosis not present

## 2023-02-27 DIAGNOSIS — R0989 Other specified symptoms and signs involving the circulatory and respiratory systems: Secondary | ICD-10-CM | POA: Diagnosis not present

## 2023-02-27 DIAGNOSIS — Z79899 Other long term (current) drug therapy: Secondary | ICD-10-CM | POA: Diagnosis not present

## 2023-02-27 DIAGNOSIS — I10 Essential (primary) hypertension: Secondary | ICD-10-CM | POA: Diagnosis not present

## 2023-02-27 DIAGNOSIS — E119 Type 2 diabetes mellitus without complications: Secondary | ICD-10-CM | POA: Diagnosis not present

## 2023-02-27 DIAGNOSIS — D1803 Hemangioma of intra-abdominal structures: Secondary | ICD-10-CM | POA: Diagnosis not present

## 2023-02-27 DIAGNOSIS — M545 Low back pain, unspecified: Secondary | ICD-10-CM | POA: Diagnosis not present

## 2023-02-27 DIAGNOSIS — K819 Cholecystitis, unspecified: Secondary | ICD-10-CM | POA: Diagnosis not present

## 2023-02-27 DIAGNOSIS — R7989 Other specified abnormal findings of blood chemistry: Secondary | ICD-10-CM | POA: Diagnosis not present

## 2023-02-27 DIAGNOSIS — Z7984 Long term (current) use of oral hypoglycemic drugs: Secondary | ICD-10-CM | POA: Diagnosis not present

## 2023-02-27 DIAGNOSIS — R000 Tachycardia, unspecified: Secondary | ICD-10-CM | POA: Diagnosis not present

## 2023-02-27 DIAGNOSIS — N3289 Other specified disorders of bladder: Secondary | ICD-10-CM | POA: Diagnosis not present

## 2023-03-04 ENCOUNTER — Ambulatory Visit (INDEPENDENT_AMBULATORY_CARE_PROVIDER_SITE_OTHER): Payer: 59 | Admitting: Podiatry

## 2023-03-04 ENCOUNTER — Encounter: Payer: Self-pay | Admitting: Podiatry

## 2023-03-04 VITALS — Ht 67.0 in | Wt 208.8 lb

## 2023-03-04 DIAGNOSIS — N289 Disorder of kidney and ureter, unspecified: Secondary | ICD-10-CM

## 2023-03-04 DIAGNOSIS — M79674 Pain in right toe(s): Secondary | ICD-10-CM

## 2023-03-04 DIAGNOSIS — L89609 Pressure ulcer of unspecified heel, unspecified stage: Secondary | ICD-10-CM

## 2023-03-04 DIAGNOSIS — B351 Tinea unguium: Secondary | ICD-10-CM

## 2023-03-04 DIAGNOSIS — M79675 Pain in left toe(s): Secondary | ICD-10-CM

## 2023-03-04 NOTE — Progress Notes (Signed)
This patient returns to my office for at risk foot care.  This patient requires this care by a professional since this patient will be at risk due to having renal insufficiency. This patient is unable to cut nails herself since the patient cannot reach her nails.These nails are painful walking and wearing shoes.  This patient presents for at risk foot care today.  General Appearance  Alert, conversant and in no acute stress.  Vascular  Dorsalis pedal pulses are palpable  bilaterally. Posterior tibial pulses are weakly palpable bilateral. Capillary return is within normal limits  bilaterally. Temperature is within normal limits  bilaterally.  Neurologic  Senn-Weinstein monofilament wire test diminished  bilaterally. Muscle power within normal limits bilaterally.  Nails Thick disfigured discolored nails with subungual debris  from hallux to fifth toes bilaterally. No evidence of bacterial infection or drainage bilaterally.  Orthopedic  No limitations of motion  feet .  No crepitus or effusions noted.  No bony pathology or digital deformities noted.HAV  B/L.  Skin  normotropic skin with no porokeratosis noted bilaterally.  No signs of infections or ulcers noted.   Heel sore left foot.   Onychomycosis  Pain in right toes  Pain in left toes  Pressure Sore left foot.  Consent was obtained for treatment procedures.   Mechanical debridement of nails 1-5  bilaterally performed with a nail nipper.  Filed with dremel without incident. ROV.  Padding dispensed.   Return office visit    3  months                  Told patient to return for periodic foot care and evaluation due to potential at risk complications.   Helane Gunther DPM

## 2023-03-05 DIAGNOSIS — W3400XS Accidental discharge from unspecified firearms or gun, sequela: Secondary | ICD-10-CM | POA: Diagnosis not present

## 2023-03-05 DIAGNOSIS — G8250 Quadriplegia, unspecified: Secondary | ICD-10-CM | POA: Diagnosis not present

## 2023-03-05 DIAGNOSIS — M792 Neuralgia and neuritis, unspecified: Secondary | ICD-10-CM | POA: Diagnosis not present

## 2023-03-05 DIAGNOSIS — Z79891 Long term (current) use of opiate analgesic: Secondary | ICD-10-CM | POA: Diagnosis not present

## 2023-03-05 DIAGNOSIS — Z7984 Long term (current) use of oral hypoglycemic drugs: Secondary | ICD-10-CM | POA: Diagnosis not present

## 2023-03-05 DIAGNOSIS — M545 Low back pain, unspecified: Secondary | ICD-10-CM | POA: Diagnosis not present

## 2023-03-05 DIAGNOSIS — L89613 Pressure ulcer of right heel, stage 3: Secondary | ICD-10-CM | POA: Diagnosis not present

## 2023-03-05 DIAGNOSIS — Z48815 Encounter for surgical aftercare following surgery on the digestive system: Secondary | ICD-10-CM | POA: Diagnosis not present

## 2023-03-05 DIAGNOSIS — Z466 Encounter for fitting and adjustment of urinary device: Secondary | ICD-10-CM | POA: Diagnosis not present

## 2023-03-05 DIAGNOSIS — G8929 Other chronic pain: Secondary | ICD-10-CM | POA: Diagnosis not present

## 2023-03-05 DIAGNOSIS — E119 Type 2 diabetes mellitus without complications: Secondary | ICD-10-CM | POA: Diagnosis not present

## 2023-03-05 DIAGNOSIS — K810 Acute cholecystitis: Secondary | ICD-10-CM | POA: Diagnosis not present

## 2023-03-08 DIAGNOSIS — Z79891 Long term (current) use of opiate analgesic: Secondary | ICD-10-CM | POA: Diagnosis not present

## 2023-03-08 DIAGNOSIS — Z7401 Bed confinement status: Secondary | ICD-10-CM | POA: Diagnosis not present

## 2023-03-08 DIAGNOSIS — K5900 Constipation, unspecified: Secondary | ICD-10-CM | POA: Diagnosis not present

## 2023-03-08 DIAGNOSIS — W3400XS Accidental discharge from unspecified firearms or gun, sequela: Secondary | ICD-10-CM | POA: Diagnosis not present

## 2023-03-08 DIAGNOSIS — K769 Liver disease, unspecified: Secondary | ICD-10-CM | POA: Diagnosis not present

## 2023-03-08 DIAGNOSIS — M792 Neuralgia and neuritis, unspecified: Secondary | ICD-10-CM | POA: Diagnosis not present

## 2023-03-08 DIAGNOSIS — K567 Ileus, unspecified: Secondary | ICD-10-CM | POA: Diagnosis not present

## 2023-03-08 DIAGNOSIS — E785 Hyperlipidemia, unspecified: Secondary | ICD-10-CM | POA: Diagnosis not present

## 2023-03-08 DIAGNOSIS — Z79899 Other long term (current) drug therapy: Secondary | ICD-10-CM | POA: Diagnosis not present

## 2023-03-08 DIAGNOSIS — G839 Paralytic syndrome, unspecified: Secondary | ICD-10-CM | POA: Diagnosis not present

## 2023-03-08 DIAGNOSIS — M545 Low back pain, unspecified: Secondary | ICD-10-CM | POA: Diagnosis not present

## 2023-03-08 DIAGNOSIS — M868X7 Other osteomyelitis, ankle and foot: Secondary | ICD-10-CM | POA: Diagnosis not present

## 2023-03-08 DIAGNOSIS — Z7984 Long term (current) use of oral hypoglycemic drugs: Secondary | ICD-10-CM | POA: Diagnosis not present

## 2023-03-08 DIAGNOSIS — M79604 Pain in right leg: Secondary | ICD-10-CM | POA: Diagnosis not present

## 2023-03-08 DIAGNOSIS — W3400XA Accidental discharge from unspecified firearms or gun, initial encounter: Secondary | ICD-10-CM | POA: Diagnosis not present

## 2023-03-08 DIAGNOSIS — S24154S Other incomplete lesion at T11-T12 level of thoracic spinal cord, sequela: Secondary | ICD-10-CM | POA: Diagnosis not present

## 2023-03-08 DIAGNOSIS — L89151 Pressure ulcer of sacral region, stage 1: Secondary | ICD-10-CM | POA: Diagnosis not present

## 2023-03-08 DIAGNOSIS — R208 Other disturbances of skin sensation: Secondary | ICD-10-CM | POA: Diagnosis not present

## 2023-03-08 DIAGNOSIS — J984 Other disorders of lung: Secondary | ICD-10-CM | POA: Diagnosis not present

## 2023-03-08 DIAGNOSIS — M7989 Other specified soft tissue disorders: Secondary | ICD-10-CM | POA: Diagnosis not present

## 2023-03-08 DIAGNOSIS — K810 Acute cholecystitis: Secondary | ICD-10-CM | POA: Diagnosis not present

## 2023-03-08 DIAGNOSIS — G8250 Quadriplegia, unspecified: Secondary | ICD-10-CM | POA: Diagnosis not present

## 2023-03-08 DIAGNOSIS — E119 Type 2 diabetes mellitus without complications: Secondary | ICD-10-CM | POA: Diagnosis not present

## 2023-03-08 DIAGNOSIS — R918 Other nonspecific abnormal finding of lung field: Secondary | ICD-10-CM | POA: Diagnosis not present

## 2023-03-08 DIAGNOSIS — L97418 Non-pressure chronic ulcer of right heel and midfoot with other specified severity: Secondary | ICD-10-CM | POA: Diagnosis not present

## 2023-03-08 DIAGNOSIS — K838 Other specified diseases of biliary tract: Secondary | ICD-10-CM | POA: Diagnosis not present

## 2023-03-08 DIAGNOSIS — L89613 Pressure ulcer of right heel, stage 3: Secondary | ICD-10-CM | POA: Diagnosis not present

## 2023-03-08 DIAGNOSIS — Z48815 Encounter for surgical aftercare following surgery on the digestive system: Secondary | ICD-10-CM | POA: Diagnosis not present

## 2023-03-08 DIAGNOSIS — R509 Fever, unspecified: Secondary | ICD-10-CM | POA: Diagnosis not present

## 2023-03-08 DIAGNOSIS — R200 Anesthesia of skin: Secondary | ICD-10-CM | POA: Diagnosis not present

## 2023-03-08 DIAGNOSIS — D649 Anemia, unspecified: Secondary | ICD-10-CM | POA: Diagnosis not present

## 2023-03-08 DIAGNOSIS — E1169 Type 2 diabetes mellitus with other specified complication: Secondary | ICD-10-CM | POA: Diagnosis not present

## 2023-03-08 DIAGNOSIS — E11621 Type 2 diabetes mellitus with foot ulcer: Secondary | ICD-10-CM | POA: Diagnosis not present

## 2023-03-08 DIAGNOSIS — Z960 Presence of urogenital implants: Secondary | ICD-10-CM | POA: Diagnosis not present

## 2023-03-08 DIAGNOSIS — M79605 Pain in left leg: Secondary | ICD-10-CM | POA: Diagnosis not present

## 2023-03-08 DIAGNOSIS — E871 Hypo-osmolality and hyponatremia: Secondary | ICD-10-CM | POA: Diagnosis not present

## 2023-03-08 DIAGNOSIS — G8220 Paraplegia, unspecified: Secondary | ICD-10-CM | POA: Diagnosis not present

## 2023-03-08 DIAGNOSIS — G8929 Other chronic pain: Secondary | ICD-10-CM | POA: Diagnosis not present

## 2023-03-08 DIAGNOSIS — N3001 Acute cystitis with hematuria: Secondary | ICD-10-CM | POA: Diagnosis not present

## 2023-03-08 DIAGNOSIS — M869 Osteomyelitis, unspecified: Secondary | ICD-10-CM | POA: Diagnosis not present

## 2023-03-08 DIAGNOSIS — L97519 Non-pressure chronic ulcer of other part of right foot with unspecified severity: Secondary | ICD-10-CM | POA: Diagnosis not present

## 2023-03-08 DIAGNOSIS — I10 Essential (primary) hypertension: Secondary | ICD-10-CM | POA: Diagnosis not present

## 2023-03-08 DIAGNOSIS — R202 Paresthesia of skin: Secondary | ICD-10-CM | POA: Diagnosis not present

## 2023-03-08 DIAGNOSIS — Z466 Encounter for fitting and adjustment of urinary device: Secondary | ICD-10-CM | POA: Diagnosis not present

## 2023-03-08 DIAGNOSIS — K5730 Diverticulosis of large intestine without perforation or abscess without bleeding: Secondary | ICD-10-CM | POA: Diagnosis not present

## 2023-03-09 DIAGNOSIS — K5900 Constipation, unspecified: Secondary | ICD-10-CM | POA: Diagnosis not present

## 2023-03-09 DIAGNOSIS — Z7401 Bed confinement status: Secondary | ICD-10-CM | POA: Diagnosis not present

## 2023-03-09 DIAGNOSIS — K567 Ileus, unspecified: Secondary | ICD-10-CM | POA: Diagnosis not present

## 2023-03-09 DIAGNOSIS — Z743 Need for continuous supervision: Secondary | ICD-10-CM | POA: Diagnosis not present

## 2023-03-09 DIAGNOSIS — T8131XA Disruption of external operation (surgical) wound, not elsewhere classified, initial encounter: Secondary | ICD-10-CM | POA: Diagnosis not present

## 2023-03-09 DIAGNOSIS — L97418 Non-pressure chronic ulcer of right heel and midfoot with other specified severity: Secondary | ICD-10-CM | POA: Diagnosis not present

## 2023-03-09 DIAGNOSIS — Z978 Presence of other specified devices: Secondary | ICD-10-CM | POA: Diagnosis not present

## 2023-03-09 DIAGNOSIS — L0889 Other specified local infections of the skin and subcutaneous tissue: Secondary | ICD-10-CM | POA: Diagnosis not present

## 2023-03-09 DIAGNOSIS — A419 Sepsis, unspecified organism: Secondary | ICD-10-CM | POA: Diagnosis not present

## 2023-03-09 DIAGNOSIS — M7989 Other specified soft tissue disorders: Secondary | ICD-10-CM | POA: Diagnosis not present

## 2023-03-09 DIAGNOSIS — I10 Essential (primary) hypertension: Secondary | ICD-10-CM | POA: Diagnosis not present

## 2023-03-09 DIAGNOSIS — S91301A Unspecified open wound, right foot, initial encounter: Secondary | ICD-10-CM | POA: Diagnosis not present

## 2023-03-09 DIAGNOSIS — L97519 Non-pressure chronic ulcer of other part of right foot with unspecified severity: Secondary | ICD-10-CM | POA: Diagnosis not present

## 2023-03-09 DIAGNOSIS — S9031XA Contusion of right foot, initial encounter: Secondary | ICD-10-CM | POA: Diagnosis not present

## 2023-03-09 DIAGNOSIS — Z9049 Acquired absence of other specified parts of digestive tract: Secondary | ICD-10-CM | POA: Diagnosis not present

## 2023-03-09 DIAGNOSIS — N318 Other neuromuscular dysfunction of bladder: Secondary | ICD-10-CM | POA: Diagnosis not present

## 2023-03-09 DIAGNOSIS — E876 Hypokalemia: Secondary | ICD-10-CM | POA: Diagnosis not present

## 2023-03-09 DIAGNOSIS — G8929 Other chronic pain: Secondary | ICD-10-CM | POA: Diagnosis not present

## 2023-03-09 DIAGNOSIS — Z803 Family history of malignant neoplasm of breast: Secondary | ICD-10-CM | POA: Diagnosis not present

## 2023-03-09 DIAGNOSIS — N319 Neuromuscular dysfunction of bladder, unspecified: Secondary | ICD-10-CM | POA: Diagnosis not present

## 2023-03-09 DIAGNOSIS — E278 Other specified disorders of adrenal gland: Secondary | ICD-10-CM | POA: Diagnosis not present

## 2023-03-09 DIAGNOSIS — Z823 Family history of stroke: Secondary | ICD-10-CM | POA: Diagnosis not present

## 2023-03-09 DIAGNOSIS — D509 Iron deficiency anemia, unspecified: Secondary | ICD-10-CM | POA: Diagnosis not present

## 2023-03-09 DIAGNOSIS — G8921 Chronic pain due to trauma: Secondary | ICD-10-CM | POA: Diagnosis not present

## 2023-03-09 DIAGNOSIS — G8220 Paraplegia, unspecified: Secondary | ICD-10-CM | POA: Diagnosis not present

## 2023-03-09 DIAGNOSIS — E11621 Type 2 diabetes mellitus with foot ulcer: Secondary | ICD-10-CM | POA: Diagnosis not present

## 2023-03-09 DIAGNOSIS — E119 Type 2 diabetes mellitus without complications: Secondary | ICD-10-CM | POA: Diagnosis not present

## 2023-03-09 DIAGNOSIS — Z886 Allergy status to analgesic agent status: Secondary | ICD-10-CM | POA: Diagnosis not present

## 2023-03-09 DIAGNOSIS — J9811 Atelectasis: Secondary | ICD-10-CM | POA: Diagnosis not present

## 2023-03-09 DIAGNOSIS — M868X7 Other osteomyelitis, ankle and foot: Secondary | ICD-10-CM | POA: Diagnosis not present

## 2023-03-09 DIAGNOSIS — E279 Disorder of adrenal gland, unspecified: Secondary | ICD-10-CM | POA: Diagnosis not present

## 2023-03-09 DIAGNOSIS — M86171 Other acute osteomyelitis, right ankle and foot: Secondary | ICD-10-CM | POA: Diagnosis not present

## 2023-03-09 DIAGNOSIS — M899 Disorder of bone, unspecified: Secondary | ICD-10-CM | POA: Diagnosis not present

## 2023-03-09 DIAGNOSIS — M869 Osteomyelitis, unspecified: Secondary | ICD-10-CM | POA: Diagnosis not present

## 2023-03-09 DIAGNOSIS — S24154S Other incomplete lesion at T11-T12 level of thoracic spinal cord, sequela: Secondary | ICD-10-CM | POA: Diagnosis not present

## 2023-03-09 DIAGNOSIS — L89159 Pressure ulcer of sacral region, unspecified stage: Secondary | ICD-10-CM | POA: Diagnosis not present

## 2023-03-09 DIAGNOSIS — E1169 Type 2 diabetes mellitus with other specified complication: Secondary | ICD-10-CM | POA: Diagnosis not present

## 2023-03-10 DIAGNOSIS — G8220 Paraplegia, unspecified: Secondary | ICD-10-CM | POA: Diagnosis not present

## 2023-03-10 DIAGNOSIS — D509 Iron deficiency anemia, unspecified: Secondary | ICD-10-CM | POA: Diagnosis not present

## 2023-03-10 DIAGNOSIS — Z978 Presence of other specified devices: Secondary | ICD-10-CM | POA: Diagnosis not present

## 2023-03-10 DIAGNOSIS — M86171 Other acute osteomyelitis, right ankle and foot: Secondary | ICD-10-CM | POA: Diagnosis not present

## 2023-03-10 DIAGNOSIS — E119 Type 2 diabetes mellitus without complications: Secondary | ICD-10-CM | POA: Diagnosis not present

## 2023-03-11 DIAGNOSIS — G8220 Paraplegia, unspecified: Secondary | ICD-10-CM | POA: Diagnosis not present

## 2023-03-11 DIAGNOSIS — I10 Essential (primary) hypertension: Secondary | ICD-10-CM | POA: Diagnosis not present

## 2023-03-11 DIAGNOSIS — E119 Type 2 diabetes mellitus without complications: Secondary | ICD-10-CM | POA: Diagnosis not present

## 2023-03-11 DIAGNOSIS — Z978 Presence of other specified devices: Secondary | ICD-10-CM | POA: Diagnosis not present

## 2023-03-11 DIAGNOSIS — D509 Iron deficiency anemia, unspecified: Secondary | ICD-10-CM | POA: Diagnosis not present

## 2023-03-11 DIAGNOSIS — M86171 Other acute osteomyelitis, right ankle and foot: Secondary | ICD-10-CM | POA: Diagnosis not present

## 2023-03-12 DIAGNOSIS — E119 Type 2 diabetes mellitus without complications: Secondary | ICD-10-CM | POA: Diagnosis not present

## 2023-03-12 DIAGNOSIS — G8220 Paraplegia, unspecified: Secondary | ICD-10-CM | POA: Diagnosis not present

## 2023-03-12 DIAGNOSIS — D509 Iron deficiency anemia, unspecified: Secondary | ICD-10-CM | POA: Diagnosis not present

## 2023-03-12 DIAGNOSIS — Z978 Presence of other specified devices: Secondary | ICD-10-CM | POA: Diagnosis not present

## 2023-03-12 DIAGNOSIS — M86171 Other acute osteomyelitis, right ankle and foot: Secondary | ICD-10-CM | POA: Diagnosis not present

## 2023-03-13 DIAGNOSIS — D509 Iron deficiency anemia, unspecified: Secondary | ICD-10-CM | POA: Diagnosis not present

## 2023-03-13 DIAGNOSIS — M86171 Other acute osteomyelitis, right ankle and foot: Secondary | ICD-10-CM | POA: Diagnosis not present

## 2023-03-13 DIAGNOSIS — Z978 Presence of other specified devices: Secondary | ICD-10-CM | POA: Diagnosis not present

## 2023-03-13 DIAGNOSIS — G8220 Paraplegia, unspecified: Secondary | ICD-10-CM | POA: Diagnosis not present

## 2023-03-13 DIAGNOSIS — E119 Type 2 diabetes mellitus without complications: Secondary | ICD-10-CM | POA: Diagnosis not present

## 2023-03-18 ENCOUNTER — Telehealth: Payer: Self-pay

## 2023-03-18 DIAGNOSIS — Z48815 Encounter for surgical aftercare following surgery on the digestive system: Secondary | ICD-10-CM | POA: Diagnosis not present

## 2023-03-18 DIAGNOSIS — Z7984 Long term (current) use of oral hypoglycemic drugs: Secondary | ICD-10-CM | POA: Diagnosis not present

## 2023-03-18 DIAGNOSIS — K810 Acute cholecystitis: Secondary | ICD-10-CM | POA: Diagnosis not present

## 2023-03-18 DIAGNOSIS — M792 Neuralgia and neuritis, unspecified: Secondary | ICD-10-CM | POA: Diagnosis not present

## 2023-03-18 DIAGNOSIS — Z466 Encounter for fitting and adjustment of urinary device: Secondary | ICD-10-CM | POA: Diagnosis not present

## 2023-03-18 DIAGNOSIS — L89613 Pressure ulcer of right heel, stage 3: Secondary | ICD-10-CM | POA: Diagnosis not present

## 2023-03-18 DIAGNOSIS — M545 Low back pain, unspecified: Secondary | ICD-10-CM | POA: Diagnosis not present

## 2023-03-18 DIAGNOSIS — G8250 Quadriplegia, unspecified: Secondary | ICD-10-CM | POA: Diagnosis not present

## 2023-03-18 DIAGNOSIS — Z79891 Long term (current) use of opiate analgesic: Secondary | ICD-10-CM | POA: Diagnosis not present

## 2023-03-18 DIAGNOSIS — G8929 Other chronic pain: Secondary | ICD-10-CM | POA: Diagnosis not present

## 2023-03-18 DIAGNOSIS — E119 Type 2 diabetes mellitus without complications: Secondary | ICD-10-CM | POA: Diagnosis not present

## 2023-03-18 DIAGNOSIS — W3400XS Accidental discharge from unspecified firearms or gun, sequela: Secondary | ICD-10-CM | POA: Diagnosis not present
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
## 2023-03-20 DIAGNOSIS — E875 Hyperkalemia: Secondary | ICD-10-CM | POA: Diagnosis not present

## 2023-03-20 DIAGNOSIS — R6520 Severe sepsis without septic shock: Secondary | ICD-10-CM | POA: Diagnosis not present

## 2023-03-20 DIAGNOSIS — L89614 Pressure ulcer of right heel, stage 4: Secondary | ICD-10-CM | POA: Diagnosis not present

## 2023-03-20 DIAGNOSIS — R9082 White matter disease, unspecified: Secondary | ICD-10-CM | POA: Diagnosis not present

## 2023-03-20 DIAGNOSIS — I700 Atherosclerosis of aorta: Secondary | ICD-10-CM | POA: Diagnosis not present

## 2023-03-20 DIAGNOSIS — R918 Other nonspecific abnormal finding of lung field: Secondary | ICD-10-CM | POA: Diagnosis not present

## 2023-03-20 DIAGNOSIS — K5900 Constipation, unspecified: Secondary | ICD-10-CM | POA: Diagnosis not present

## 2023-03-20 DIAGNOSIS — K5730 Diverticulosis of large intestine without perforation or abscess without bleeding: Secondary | ICD-10-CM | POA: Diagnosis not present

## 2023-03-20 DIAGNOSIS — Z79899 Other long term (current) drug therapy: Secondary | ICD-10-CM | POA: Diagnosis not present

## 2023-03-20 DIAGNOSIS — N179 Acute kidney failure, unspecified: Secondary | ICD-10-CM | POA: Diagnosis not present

## 2023-03-20 DIAGNOSIS — Z978 Presence of other specified devices: Secondary | ICD-10-CM | POA: Diagnosis not present

## 2023-03-20 DIAGNOSIS — I10 Essential (primary) hypertension: Secondary | ICD-10-CM | POA: Diagnosis not present

## 2023-03-20 DIAGNOSIS — R059 Cough, unspecified: Secondary | ICD-10-CM | POA: Diagnosis not present

## 2023-03-20 DIAGNOSIS — N12 Tubulo-interstitial nephritis, not specified as acute or chronic: Secondary | ICD-10-CM | POA: Diagnosis not present

## 2023-03-20 DIAGNOSIS — Z7984 Long term (current) use of oral hypoglycemic drugs: Secondary | ICD-10-CM | POA: Diagnosis not present

## 2023-03-20 DIAGNOSIS — N390 Urinary tract infection, site not specified: Secondary | ICD-10-CM | POA: Diagnosis not present

## 2023-03-20 DIAGNOSIS — R509 Fever, unspecified: Secondary | ICD-10-CM | POA: Diagnosis not present

## 2023-03-20 DIAGNOSIS — E871 Hypo-osmolality and hyponatremia: Secondary | ICD-10-CM | POA: Diagnosis not present

## 2023-03-20 DIAGNOSIS — L89613 Pressure ulcer of right heel, stage 3: Secondary | ICD-10-CM | POA: Diagnosis not present

## 2023-03-20 DIAGNOSIS — N170 Acute kidney failure with tubular necrosis: Secondary | ICD-10-CM | POA: Diagnosis not present

## 2023-03-20 DIAGNOSIS — Z9049 Acquired absence of other specified parts of digestive tract: Secondary | ICD-10-CM | POA: Diagnosis not present

## 2023-03-20 DIAGNOSIS — I517 Cardiomegaly: Secondary | ICD-10-CM | POA: Diagnosis not present

## 2023-03-20 DIAGNOSIS — D509 Iron deficiency anemia, unspecified: Secondary | ICD-10-CM | POA: Diagnosis not present

## 2023-03-20 DIAGNOSIS — Z1152 Encounter for screening for COVID-19: Secondary | ICD-10-CM | POA: Diagnosis not present

## 2023-03-20 DIAGNOSIS — A419 Sepsis, unspecified organism: Secondary | ICD-10-CM | POA: Diagnosis not present

## 2023-03-20 DIAGNOSIS — I2510 Atherosclerotic heart disease of native coronary artery without angina pectoris: Secondary | ICD-10-CM | POA: Diagnosis not present

## 2023-03-20 DIAGNOSIS — Z419 Encounter for procedure for purposes other than remedying health state, unspecified: Secondary | ICD-10-CM | POA: Diagnosis not present

## 2023-03-20 DIAGNOSIS — G8220 Paraplegia, unspecified: Secondary | ICD-10-CM | POA: Diagnosis not present

## 2023-03-20 DIAGNOSIS — N319 Neuromuscular dysfunction of bladder, unspecified: Secondary | ICD-10-CM | POA: Diagnosis not present

## 2023-03-20 DIAGNOSIS — E119 Type 2 diabetes mellitus without complications: Secondary | ICD-10-CM | POA: Diagnosis not present

## 2023-03-20 DIAGNOSIS — J9811 Atelectasis: Secondary | ICD-10-CM | POA: Diagnosis not present

## 2023-03-20 DIAGNOSIS — B3749 Other urogenital candidiasis: Secondary | ICD-10-CM | POA: Diagnosis not present

## 2023-03-20 DIAGNOSIS — D696 Thrombocytopenia, unspecified: Secondary | ICD-10-CM | POA: Diagnosis not present

## 2023-03-23 DIAGNOSIS — I10 Essential (primary) hypertension: Secondary | ICD-10-CM | POA: Diagnosis not present

## 2023-03-23 DIAGNOSIS — K5900 Constipation, unspecified: Secondary | ICD-10-CM | POA: Diagnosis not present

## 2023-03-23 DIAGNOSIS — T465X5A Adverse effect of other antihypertensive drugs, initial encounter: Secondary | ICD-10-CM | POA: Diagnosis not present

## 2023-03-23 DIAGNOSIS — G9341 Metabolic encephalopathy: Secondary | ICD-10-CM | POA: Diagnosis not present

## 2023-03-23 DIAGNOSIS — E875 Hyperkalemia: Secondary | ICD-10-CM | POA: Diagnosis not present

## 2023-03-23 DIAGNOSIS — Z9049 Acquired absence of other specified parts of digestive tract: Secondary | ICD-10-CM | POA: Diagnosis not present

## 2023-03-23 DIAGNOSIS — S2194XS Puncture wound with foreign body of unspecified part of thorax, sequela: Secondary | ICD-10-CM | POA: Diagnosis not present

## 2023-03-23 DIAGNOSIS — N170 Acute kidney failure with tubular necrosis: Secondary | ICD-10-CM | POA: Diagnosis not present

## 2023-03-23 DIAGNOSIS — E119 Type 2 diabetes mellitus without complications: Secondary | ICD-10-CM | POA: Diagnosis not present

## 2023-03-23 DIAGNOSIS — S31000A Unspecified open wound of lower back and pelvis without penetration into retroperitoneum, initial encounter: Secondary | ICD-10-CM | POA: Diagnosis not present

## 2023-03-23 DIAGNOSIS — S24104S Unspecified injury at T11-T12 level of thoracic spinal cord, sequela: Secondary | ICD-10-CM | POA: Diagnosis not present

## 2023-03-23 DIAGNOSIS — N319 Neuromuscular dysfunction of bladder, unspecified: Secondary | ICD-10-CM | POA: Diagnosis not present

## 2023-03-23 DIAGNOSIS — G8220 Paraplegia, unspecified: Secondary | ICD-10-CM | POA: Diagnosis not present

## 2023-03-23 DIAGNOSIS — K769 Liver disease, unspecified: Secondary | ICD-10-CM | POA: Diagnosis not present

## 2023-03-23 DIAGNOSIS — G8221 Paraplegia, complete: Secondary | ICD-10-CM | POA: Diagnosis not present

## 2023-03-23 DIAGNOSIS — L89613 Pressure ulcer of right heel, stage 3: Secondary | ICD-10-CM | POA: Diagnosis not present

## 2023-03-23 DIAGNOSIS — E872 Acidosis, unspecified: Secondary | ICD-10-CM | POA: Diagnosis not present

## 2023-03-23 DIAGNOSIS — L89614 Pressure ulcer of right heel, stage 4: Secondary | ICD-10-CM | POA: Diagnosis not present

## 2023-03-23 DIAGNOSIS — M6281 Muscle weakness (generalized): Secondary | ICD-10-CM | POA: Diagnosis not present

## 2023-03-23 DIAGNOSIS — Z978 Presence of other specified devices: Secondary | ICD-10-CM | POA: Diagnosis not present

## 2023-03-23 DIAGNOSIS — Z8619 Personal history of other infectious and parasitic diseases: Secondary | ICD-10-CM | POA: Diagnosis not present

## 2023-03-23 DIAGNOSIS — R509 Fever, unspecified: Secondary | ICD-10-CM | POA: Diagnosis not present

## 2023-03-23 DIAGNOSIS — Z1811 Retained magnetic metal fragments: Secondary | ICD-10-CM | POA: Diagnosis not present

## 2023-03-23 DIAGNOSIS — Z960 Presence of urogenital implants: Secondary | ICD-10-CM | POA: Diagnosis not present

## 2023-03-23 DIAGNOSIS — N179 Acute kidney failure, unspecified: Secondary | ICD-10-CM | POA: Diagnosis not present

## 2023-03-23 DIAGNOSIS — D509 Iron deficiency anemia, unspecified: Secondary | ICD-10-CM | POA: Diagnosis not present

## 2023-03-23 DIAGNOSIS — D1809 Hemangioma of other sites: Secondary | ICD-10-CM | POA: Diagnosis not present

## 2023-03-23 DIAGNOSIS — G8929 Other chronic pain: Secondary | ICD-10-CM | POA: Diagnosis not present

## 2023-03-23 DIAGNOSIS — L270 Generalized skin eruption due to drugs and medicaments taken internally: Secondary | ICD-10-CM | POA: Diagnosis not present

## 2023-03-23 DIAGNOSIS — Z885 Allergy status to narcotic agent status: Secondary | ICD-10-CM | POA: Diagnosis not present

## 2023-03-23 DIAGNOSIS — K838 Other specified diseases of biliary tract: Secondary | ICD-10-CM | POA: Diagnosis not present

## 2023-03-24 DIAGNOSIS — N179 Acute kidney failure, unspecified: Secondary | ICD-10-CM | POA: Diagnosis not present

## 2023-03-25 ENCOUNTER — Telehealth: Payer: Self-pay

## 2023-03-25 ENCOUNTER — Other Ambulatory Visit: Payer: Self-pay

## 2023-03-25 DIAGNOSIS — N179 Acute kidney failure, unspecified: Secondary | ICD-10-CM | POA: Diagnosis not present

## 2023-03-25 NOTE — Patient Outreach (Signed)
  Care Management  Transitions of Care Program   03/25/2023 Name: LISAMARIA STAIANO MRN: [id number] DOB: [birth year]  Subjective: Lane Hacker Modesitt is a 67 y.o. year old female who is a primary care patient of Marcine Matar, MD. The Care Management team was unable to reach the patient by phone to assess and address transitions of care needs.   Plan: Additional outreach attempts will be made to reach the patient enrolled in the [facility] Program (Post Inpatient/ED Visit). Alyse Low, RN, BA, [facility], CRRN [facility] Coordinator, Transition of Care Ph # [phone]

## 2023-03-26 DIAGNOSIS — N179 Acute kidney failure, unspecified: Secondary | ICD-10-CM | POA: Diagnosis not present

## 2023-03-27 DIAGNOSIS — N179 Acute kidney failure, unspecified: Secondary | ICD-10-CM | POA: Diagnosis not present

## 2023-03-28 DIAGNOSIS — N179 Acute kidney failure, unspecified: Secondary | ICD-10-CM | POA: Diagnosis not present

## 2023-03-29 DIAGNOSIS — N179 Acute kidney failure, unspecified: Secondary | ICD-10-CM | POA: Diagnosis not present

## 2023-03-30 DIAGNOSIS — N179 Acute kidney failure, unspecified: Secondary | ICD-10-CM | POA: Diagnosis not present

## 2023-03-31 DIAGNOSIS — N179 Acute kidney failure, unspecified: Secondary | ICD-10-CM | POA: Diagnosis not present

## 2023-04-01 ENCOUNTER — Telehealth: Payer: Self-pay

## 2023-04-01 ENCOUNTER — Other Ambulatory Visit: Payer: Self-pay

## 2023-04-01 ENCOUNTER — Inpatient Hospital Stay (INDEPENDENT_AMBULATORY_CARE_PROVIDER_SITE_OTHER): Payer: 59 | Admitting: Primary Care

## 2023-04-01 DIAGNOSIS — N179 Acute kidney failure, unspecified: Secondary | ICD-10-CM | POA: Diagnosis not present

## 2023-04-01 NOTE — Patient Outreach (Signed)
  Care Management  Transitions of Care Program Transitions of Care Post-discharge (patient was re-hospitalized and remains at [facility] being treated for renal failure)  04/01/2023 Name: VEEDA RUDOLF MRN: [id number] DOB: Nov 26, 1955  Subjective: Lane Hacker Shewmake is a 67 y.o. year old female who is a primary care patient of Marcine Matar, MD. The Care Management team was unable to reach the patient by phone to assess and address transitions of care needs.   Plan: Additional outreach attempts will be made to reach the patient enrolled in the [facility] Program (Post Inpatient/ED Visit).  Alyse Low, RN, BA, [facility], CRRN [facility] Coordinator, Transition of Care Ph # [phone]

## 2023-04-02 DIAGNOSIS — N179 Acute kidney failure, unspecified: Secondary | ICD-10-CM | POA: Diagnosis not present

## 2023-04-02 DIAGNOSIS — G8220 Paraplegia, unspecified: Secondary | ICD-10-CM | POA: Diagnosis not present

## 2023-04-02 DIAGNOSIS — Z978 Presence of other specified devices: Secondary | ICD-10-CM | POA: Diagnosis not present

## 2023-04-03 DIAGNOSIS — N179 Acute kidney failure, unspecified: Secondary | ICD-10-CM | POA: Diagnosis not present

## 2023-04-04 DIAGNOSIS — N179 Acute kidney failure, unspecified: Secondary | ICD-10-CM | POA: Diagnosis not present

## 2023-04-05 DIAGNOSIS — N179 Acute kidney failure, unspecified: Secondary | ICD-10-CM | POA: Diagnosis not present

## 2023-04-06 DIAGNOSIS — N179 Acute kidney failure, unspecified: Secondary | ICD-10-CM | POA: Diagnosis not present

## 2023-04-07 DIAGNOSIS — N179 Acute kidney failure, unspecified: Secondary | ICD-10-CM | POA: Diagnosis not present

## 2023-04-08 ENCOUNTER — Other Ambulatory Visit: Payer: Self-pay

## 2023-04-08 DIAGNOSIS — N179 Acute kidney failure, unspecified: Secondary | ICD-10-CM | POA: Diagnosis not present

## 2023-04-08 DIAGNOSIS — N319 Neuromuscular dysfunction of bladder, unspecified: Secondary | ICD-10-CM | POA: Diagnosis not present

## 2023-04-08 DIAGNOSIS — M6281 Muscle weakness (generalized): Secondary | ICD-10-CM | POA: Diagnosis not present

## 2023-04-08 DIAGNOSIS — G8221 Paraplegia, complete: Secondary | ICD-10-CM | POA: Diagnosis not present

## 2023-04-08 NOTE — Patient Outreach (Signed)
  Care Management  Transitions of Care Program Transitions of Care Post-discharge - patient remains hospitalized as of 04/08/23  12/26/2024Name: Shannon [name] MRN: [id number] DOB: September 22, 1955  Subjective: Shannon [name] is a 67 y.o. year old female who is a primary care patient of Marcine Matar, MD. The Care Management team was unable to reach the patient by phone to assess and address transitions of care needs.Was able to speak to a private caregiver of patient - patient remains hospitalized at [facility] and is expected to be discharged to a Skilled Nursing Facility for LTC  Plan: Additional outreach attempts will be made to reach the patient enrolled in the [facility] Program (Post Inpatient/ED Visit).unless patient does discharge to a Skilled Nursing Facility for rehabilitative and/or long term care.  Alyse Low, RN, BA, [facility], CRRN [facility] Coordinator, Transition of Care Ph # [phone]

## 2023-04-09 DIAGNOSIS — K769 Liver disease, unspecified: Secondary | ICD-10-CM | POA: Diagnosis not present

## 2023-04-09 DIAGNOSIS — Z9049 Acquired absence of other specified parts of digestive tract: Secondary | ICD-10-CM | POA: Diagnosis not present

## 2023-04-09 DIAGNOSIS — N179 Acute kidney failure, unspecified: Secondary | ICD-10-CM | POA: Diagnosis not present

## 2023-04-10 DIAGNOSIS — N179 Acute kidney failure, unspecified: Secondary | ICD-10-CM | POA: Diagnosis not present

## 2023-04-11 DIAGNOSIS — Z9049 Acquired absence of other specified parts of digestive tract: Secondary | ICD-10-CM | POA: Diagnosis not present

## 2023-04-11 DIAGNOSIS — K5900 Constipation, unspecified: Secondary | ICD-10-CM | POA: Diagnosis not present

## 2023-04-11 DIAGNOSIS — K838 Other specified diseases of biliary tract: Secondary | ICD-10-CM | POA: Diagnosis not present

## 2023-04-11 DIAGNOSIS — N179 Acute kidney failure, unspecified: Secondary | ICD-10-CM | POA: Diagnosis not present

## 2023-04-11 DIAGNOSIS — K769 Liver disease, unspecified: Secondary | ICD-10-CM | POA: Diagnosis not present

## 2023-04-12 DIAGNOSIS — N179 Acute kidney failure, unspecified: Secondary | ICD-10-CM | POA: Diagnosis not present

## 2023-04-13 DIAGNOSIS — N179 Acute kidney failure, unspecified: Secondary | ICD-10-CM | POA: Diagnosis not present

## 2023-04-15 ENCOUNTER — Other Ambulatory Visit: Payer: Self-pay

## 2023-04-15 ENCOUNTER — Telehealth: Payer: Self-pay

## 2023-04-15 NOTE — Patient Outreach (Signed)
  Care Management  Transitions of Care Program Transitions of Care Post-discharge Program closed due to patient discharge to SNF   04/15/2023 Name: AREONNA BRAN MRN: [id number] DOB: 07-Oct-1955  Subjective: Delcia JINNY Hulett is a 68 y.o. year old female who is a primary care patient of Vicci Barnie NOVAK, MD. The Care Management team Engaged with patient's caregiver as well as Reviewed [facility] documentation dated 04/15/23.  Patient discharging from [facility] today @1530  [facility] [employer].    Channing Larry, RN, BA, [facility], CRRN [facility] Coordinator, Transition of Care Ph # [phone]

## 2023-06-07 ENCOUNTER — Ambulatory Visit: Payer: 59 | Admitting: Podiatry
